# Patient Record
Sex: Female | Born: 1935 | Race: White | Hispanic: No | Marital: Married | Smoking: Former smoker
Health system: Southern US, Community
[De-identification: ages and names within clinical notes are randomized; demographics above are authoritative.]

## PROBLEM LIST (undated history)

## (undated) DIAGNOSIS — G4762 Sleep related leg cramps: Secondary | ICD-10-CM

## (undated) DIAGNOSIS — E042 Nontoxic multinodular goiter: Secondary | ICD-10-CM

## (undated) DIAGNOSIS — N3281 Overactive bladder: Secondary | ICD-10-CM

## (undated) DIAGNOSIS — I872 Venous insufficiency (chronic) (peripheral): Secondary | ICD-10-CM

## (undated) DIAGNOSIS — G629 Polyneuropathy, unspecified: Secondary | ICD-10-CM

## (undated) DIAGNOSIS — I509 Heart failure, unspecified: Secondary | ICD-10-CM

## (undated) DIAGNOSIS — R351 Nocturia: Secondary | ICD-10-CM

## (undated) DIAGNOSIS — C801 Malignant (primary) neoplasm, unspecified: Secondary | ICD-10-CM

## (undated) DIAGNOSIS — E213 Hyperparathyroidism, unspecified: Secondary | ICD-10-CM

## (undated) DIAGNOSIS — D649 Anemia, unspecified: Secondary | ICD-10-CM

## (undated) DIAGNOSIS — I480 Paroxysmal atrial fibrillation: Secondary | ICD-10-CM

## (undated) DIAGNOSIS — M810 Age-related osteoporosis without current pathological fracture: Secondary | ICD-10-CM

## (undated) DIAGNOSIS — Z9889 Other specified postprocedural states: Secondary | ICD-10-CM

## (undated) DIAGNOSIS — I4891 Unspecified atrial fibrillation: Secondary | ICD-10-CM

## (undated) DIAGNOSIS — I10 Essential (primary) hypertension: Secondary | ICD-10-CM

## (undated) DIAGNOSIS — M1990 Unspecified osteoarthritis, unspecified site: Secondary | ICD-10-CM

## (undated) DIAGNOSIS — C679 Malignant neoplasm of bladder, unspecified: Secondary | ICD-10-CM

## (undated) DIAGNOSIS — K219 Gastro-esophageal reflux disease without esophagitis: Secondary | ICD-10-CM

## (undated) DIAGNOSIS — R112 Nausea with vomiting, unspecified: Secondary | ICD-10-CM

## (undated) DIAGNOSIS — E559 Vitamin D deficiency, unspecified: Secondary | ICD-10-CM

## (undated) DIAGNOSIS — R600 Localized edema: Secondary | ICD-10-CM

## (undated) DIAGNOSIS — I5032 Chronic diastolic (congestive) heart failure: Secondary | ICD-10-CM

## (undated) DIAGNOSIS — Z9221 Personal history of antineoplastic chemotherapy: Secondary | ICD-10-CM

## (undated) DIAGNOSIS — N6310 Unspecified lump in the right breast, unspecified quadrant: Secondary | ICD-10-CM

## (undated) DIAGNOSIS — Z973 Presence of spectacles and contact lenses: Secondary | ICD-10-CM

## (undated) DIAGNOSIS — D509 Iron deficiency anemia, unspecified: Secondary | ICD-10-CM

## (undated) HISTORY — PX: OVARIAN CYST SURGERY: SHX726

## (undated) HISTORY — PX: OVARIAN CYST REMOVAL: SHX89

## (undated) HISTORY — DX: Heart failure, unspecified: I50.9

## (undated) HISTORY — PX: TONSILLECTOMY: SUR1361

## (undated) HISTORY — PX: CHOLECYSTECTOMY: SHX55

## (undated) HISTORY — PX: PARATHYROIDECTOMY: SHX19

---
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
## 2004-10-09 HISTORY — PX: THYROIDECTOMY, PARTIAL: SHX18

## 2006-10-09 DIAGNOSIS — Z853 Personal history of malignant neoplasm of breast: Secondary | ICD-10-CM

## 2006-10-09 HISTORY — DX: Personal history of malignant neoplasm of breast: Z85.3

## 2007-03-05 HISTORY — PX: MASTECTOMY WITH AXILLARY LYMPH NODE DISSECTION: SHX5661

## 2008-10-09 HISTORY — PX: MASTECTOMY: SHX3

## 2015-03-06 DIAGNOSIS — Z9221 Personal history of antineoplastic chemotherapy: Secondary | ICD-10-CM | POA: Insufficient documentation

## 2015-03-06 DIAGNOSIS — Z9012 Acquired absence of left breast and nipple: Secondary | ICD-10-CM | POA: Insufficient documentation

## 2015-03-06 DIAGNOSIS — C50812 Malignant neoplasm of overlapping sites of left female breast: Secondary | ICD-10-CM | POA: Insufficient documentation

## 2016-01-17 DIAGNOSIS — K219 Gastro-esophageal reflux disease without esophagitis: Secondary | ICD-10-CM | POA: Insufficient documentation

## 2016-01-17 DIAGNOSIS — Z853 Personal history of malignant neoplasm of breast: Secondary | ICD-10-CM | POA: Insufficient documentation

## 2016-01-17 DIAGNOSIS — N3281 Overactive bladder: Secondary | ICD-10-CM | POA: Insufficient documentation

## 2016-01-17 DIAGNOSIS — M810 Age-related osteoporosis without current pathological fracture: Secondary | ICD-10-CM | POA: Insufficient documentation

## 2016-01-17 DIAGNOSIS — K5730 Diverticulosis of large intestine without perforation or abscess without bleeding: Secondary | ICD-10-CM | POA: Insufficient documentation

## 2016-01-17 DIAGNOSIS — E042 Nontoxic multinodular goiter: Secondary | ICD-10-CM | POA: Insufficient documentation

## 2016-01-18 DIAGNOSIS — R600 Localized edema: Secondary | ICD-10-CM | POA: Insufficient documentation

## 2017-02-23 DIAGNOSIS — R739 Hyperglycemia, unspecified: Secondary | ICD-10-CM | POA: Insufficient documentation

## 2017-02-23 DIAGNOSIS — K8020 Calculus of gallbladder without cholecystitis without obstruction: Secondary | ICD-10-CM | POA: Insufficient documentation

## 2017-02-23 DIAGNOSIS — R197 Diarrhea, unspecified: Secondary | ICD-10-CM | POA: Insufficient documentation

## 2017-02-23 DIAGNOSIS — M818 Other osteoporosis without current pathological fracture: Secondary | ICD-10-CM | POA: Insufficient documentation

## 2017-02-23 DIAGNOSIS — R1011 Right upper quadrant pain: Secondary | ICD-10-CM | POA: Insufficient documentation

## 2017-02-25 HISTORY — PX: CHOLECYSTECTOMY, LAPAROSCOPIC: SHX56

## 2017-02-26 DIAGNOSIS — Z9049 Acquired absence of other specified parts of digestive tract: Secondary | ICD-10-CM | POA: Insufficient documentation

## 2017-05-03 DIAGNOSIS — E559 Vitamin D deficiency, unspecified: Secondary | ICD-10-CM | POA: Insufficient documentation

## 2018-01-29 DIAGNOSIS — R791 Abnormal coagulation profile: Secondary | ICD-10-CM | POA: Insufficient documentation

## 2018-01-29 DIAGNOSIS — Z5181 Encounter for therapeutic drug level monitoring: Secondary | ICD-10-CM | POA: Insufficient documentation

## 2018-01-29 DIAGNOSIS — Z7901 Long term (current) use of anticoagulants: Secondary | ICD-10-CM | POA: Insufficient documentation

## 2018-12-11 DIAGNOSIS — H2513 Age-related nuclear cataract, bilateral: Secondary | ICD-10-CM | POA: Insufficient documentation

## 2020-10-09 HISTORY — PX: COLONOSCOPY WITH ESOPHAGOGASTRODUODENOSCOPY (EGD): SHX5779

## 2021-07-18 ENCOUNTER — Ambulatory Visit: Payer: Self-pay | Admitting: Surgery

## 2021-07-18 DIAGNOSIS — N6311 Unspecified lump in the right breast, upper outer quadrant: Secondary | ICD-10-CM

## 2021-08-30 ENCOUNTER — Other Ambulatory Visit: Payer: Self-pay | Admitting: Surgery

## 2021-08-30 DIAGNOSIS — N6311 Unspecified lump in the right breast, upper outer quadrant: Secondary | ICD-10-CM

## 2021-09-09 DIAGNOSIS — C50912 Malignant neoplasm of unspecified site of left female breast: Secondary | ICD-10-CM | POA: Diagnosis not present

## 2021-10-04 ENCOUNTER — Telehealth: Payer: Self-pay | Admitting: Family Medicine

## 2021-10-04 NOTE — Telephone Encounter (Signed)
Pt's daughter is a pt of dr. Lorelei Pont and she was hoping her mom could be taken on since she has lost two pcp due to retirement. Please advise.

## 2021-10-05 NOTE — Telephone Encounter (Signed)
Will address on Provider's return.

## 2021-10-11 NOTE — Telephone Encounter (Signed)
Please advise 

## 2021-10-13 NOTE — Telephone Encounter (Signed)
Okay to accept.

## 2021-10-17 DIAGNOSIS — Z5181 Encounter for therapeutic drug level monitoring: Secondary | ICD-10-CM | POA: Diagnosis not present

## 2021-10-17 DIAGNOSIS — I480 Paroxysmal atrial fibrillation: Secondary | ICD-10-CM | POA: Diagnosis not present

## 2021-10-17 DIAGNOSIS — Z7901 Long term (current) use of anticoagulants: Secondary | ICD-10-CM | POA: Diagnosis not present

## 2021-10-17 DIAGNOSIS — R791 Abnormal coagulation profile: Secondary | ICD-10-CM | POA: Diagnosis not present

## 2021-10-18 NOTE — Progress Notes (Signed)
Abigail Butts RN at Dr Cornett's office notified that the patient's seed placement on 10-21-20 is too early as patient's LD of coumadin would be 10-20-20 which is 5d prior to surgery. Abigail Butts states she will send Dr Brantley Stage a message about rescheduling.

## 2021-10-19 ENCOUNTER — Other Ambulatory Visit: Payer: Self-pay

## 2021-10-19 ENCOUNTER — Encounter (HOSPITAL_BASED_OUTPATIENT_CLINIC_OR_DEPARTMENT_OTHER): Payer: Self-pay | Admitting: Surgery

## 2021-10-21 ENCOUNTER — Ambulatory Visit
Admission: RE | Admit: 2021-10-21 | Discharge: 2021-10-21 | Disposition: A | Discharge status: Home / Self Care | Payer: Medicare HMO | Source: Ambulatory Visit | Attending: Surgery | Admitting: Surgery

## 2021-10-21 DIAGNOSIS — N6311 Unspecified lump in the right breast, upper outer quadrant: Secondary | ICD-10-CM

## 2021-10-21 DIAGNOSIS — R928 Other abnormal and inconclusive findings on diagnostic imaging of breast: Secondary | ICD-10-CM | POA: Diagnosis not present

## 2021-10-24 ENCOUNTER — Encounter (HOSPITAL_BASED_OUTPATIENT_CLINIC_OR_DEPARTMENT_OTHER)
Admission: RE | Admit: 2021-10-24 | Discharge: 2021-10-24 | Disposition: A | Discharge status: Home / Self Care | Payer: Medicare HMO | Source: Ambulatory Visit | Attending: Surgery | Admitting: Surgery

## 2021-10-24 DIAGNOSIS — I4811 Longstanding persistent atrial fibrillation: Secondary | ICD-10-CM | POA: Diagnosis not present

## 2021-10-24 DIAGNOSIS — Z01818 Encounter for other preprocedural examination: Secondary | ICD-10-CM | POA: Diagnosis not present

## 2021-10-24 DIAGNOSIS — N6311 Unspecified lump in the right breast, upper outer quadrant: Secondary | ICD-10-CM | POA: Insufficient documentation

## 2021-10-24 DIAGNOSIS — K219 Gastro-esophageal reflux disease without esophagitis: Secondary | ICD-10-CM | POA: Diagnosis not present

## 2021-10-24 DIAGNOSIS — I10 Essential (primary) hypertension: Secondary | ICD-10-CM | POA: Diagnosis not present

## 2021-10-24 DIAGNOSIS — Z9013 Acquired absence of bilateral breasts and nipples: Secondary | ICD-10-CM | POA: Diagnosis not present

## 2021-10-24 DIAGNOSIS — I4891 Unspecified atrial fibrillation: Secondary | ICD-10-CM | POA: Diagnosis not present

## 2021-10-24 DIAGNOSIS — Z853 Personal history of malignant neoplasm of breast: Secondary | ICD-10-CM | POA: Diagnosis not present

## 2021-10-24 LAB — CBC WITH DIFFERENTIAL/PLATELET
Abs Immature Granulocytes: 0.05 10*3/uL (ref 0.00–0.07)
Basophils Absolute: 0 10*3/uL (ref 0.0–0.1)
Basophils Relative: 0 %
Eosinophils Absolute: 0.3 10*3/uL (ref 0.0–0.5)
Eosinophils Relative: 3 %
HCT: 37.9 % (ref 36.0–46.0)
Hemoglobin: 11.9 g/dL — ABNORMAL LOW (ref 12.0–15.0)
Immature Granulocytes: 1 %
Lymphocytes Relative: 21 %
Lymphs Abs: 2 10*3/uL (ref 0.7–4.0)
MCH: 26.3 pg (ref 26.0–34.0)
MCHC: 31.4 g/dL (ref 30.0–36.0)
MCV: 83.7 fL (ref 80.0–100.0)
Monocytes Absolute: 0.8 10*3/uL (ref 0.1–1.0)
Monocytes Relative: 8 %
Neutro Abs: 6.6 10*3/uL (ref 1.7–7.7)
Neutrophils Relative %: 67 %
Platelets: 310 10*3/uL (ref 150–400)
RBC: 4.53 MIL/uL (ref 3.87–5.11)
RDW: 14.4 % (ref 11.5–15.5)
WBC: 9.8 10*3/uL (ref 4.0–10.5)
nRBC: 0 % (ref 0.0–0.2)

## 2021-10-24 LAB — COMPREHENSIVE METABOLIC PANEL
ALT: 30 U/L (ref 0–44)
AST: 25 U/L (ref 15–41)
Albumin: 3.9 g/dL (ref 3.5–5.0)
Alkaline Phosphatase: 45 U/L (ref 38–126)
Anion gap: 6 (ref 5–15)
BUN: 16 mg/dL (ref 8–23)
CO2: 27 mmol/L (ref 22–32)
Calcium: 10.3 mg/dL (ref 8.9–10.3)
Chloride: 108 mmol/L (ref 98–111)
Creatinine, Ser: 0.66 mg/dL (ref 0.44–1.00)
GFR, Estimated: >60 mL/min (ref >60)
Glucose, Bld: 97 mg/dL (ref 70–99)
Potassium: 4.6 mmol/L (ref 3.5–5.1)
Sodium: 141 mmol/L (ref 135–145)
Total Bilirubin: 0.8 mg/dL (ref 0.3–1.2)
Total Protein: 6.8 g/dL (ref 6.5–8.1)

## 2021-10-24 LAB — PROTIME-INR
INR: 1.1 (ref 0.8–1.2)
Prothrombin Time: 14 seconds (ref 11.4–15.2)

## 2021-10-24 NOTE — Progress Notes (Addendum)
Sent message reminding pt to come in for lab work.   Surgical soap given with instructions, pt verbalized understanding. Enhanced Recovery after Surgery  Enhanced Recovery after Surgery is a protocol used to improve the stress on your body and your recovery after surgery.  Patient Instructions  The night before surgery:  No food after midnight. ONLY clear liquids after midnight  The day of surgery (if you do NOT have diabetes):  Drink ONE (1) Pre-Surgery Clear Ensure as directed.   This drink was given to you during your hospital  pre-op appointment visit. The pre-op nurse will instruct you on the time to drink the  Pre-Surgery Ensure depending on your surgery time. Finish the drink at the designated time by the pre-op nurse.  Nothing else to drink after completing the  Pre-Surgery Clear Ensure.  The day of surgery (if you have diabetes): Drink ONE (1) Gatorade 2 (G2) as directed. This drink was given to you during your hospital  pre-op appointment visit.  The pre-op nurse will instruct you on the time to drink the   Gatorade 2 (G2) depending on your surgery time. Color of the Gatorade may vary. Red is not allowed. Nothing else to drink after completing the  Gatorade 2 (G2).         If office.you have questions, please contact your surgeons office

## 2021-10-25 ENCOUNTER — Ambulatory Visit (HOSPITAL_BASED_OUTPATIENT_CLINIC_OR_DEPARTMENT_OTHER)
Admission: RE | Admit: 2021-10-25 | Discharge: 2021-10-25 | Disposition: A | Discharge status: Home / Self Care | Payer: Medicare HMO | Attending: Surgery | Admitting: Surgery

## 2021-10-25 ENCOUNTER — Encounter (HOSPITAL_BASED_OUTPATIENT_CLINIC_OR_DEPARTMENT_OTHER): Payer: Self-pay | Admitting: Surgery

## 2021-10-25 ENCOUNTER — Ambulatory Visit (HOSPITAL_BASED_OUTPATIENT_CLINIC_OR_DEPARTMENT_OTHER): Payer: Medicare HMO | Admitting: Anesthesiology

## 2021-10-25 ENCOUNTER — Other Ambulatory Visit: Payer: Self-pay

## 2021-10-25 ENCOUNTER — Encounter (HOSPITAL_BASED_OUTPATIENT_CLINIC_OR_DEPARTMENT_OTHER)
Admission: RE | Disposition: A | Discharge status: Home / Self Care | Payer: Self-pay | Source: Home / Self Care | Attending: Surgery

## 2021-10-25 ENCOUNTER — Ambulatory Visit
Admission: RE | Admit: 2021-10-25 | Discharge: 2021-10-25 | Disposition: A | Discharge status: Home / Self Care | Payer: Self-pay | Source: Ambulatory Visit | Attending: Surgery | Admitting: Surgery

## 2021-10-25 DIAGNOSIS — K219 Gastro-esophageal reflux disease without esophagitis: Secondary | ICD-10-CM | POA: Insufficient documentation

## 2021-10-25 DIAGNOSIS — N6311 Unspecified lump in the right breast, upper outer quadrant: Secondary | ICD-10-CM | POA: Insufficient documentation

## 2021-10-25 DIAGNOSIS — I4811 Longstanding persistent atrial fibrillation: Secondary | ICD-10-CM

## 2021-10-25 DIAGNOSIS — I4891 Unspecified atrial fibrillation: Secondary | ICD-10-CM | POA: Insufficient documentation

## 2021-10-25 DIAGNOSIS — R928 Other abnormal and inconclusive findings on diagnostic imaging of breast: Secondary | ICD-10-CM | POA: Diagnosis not present

## 2021-10-25 DIAGNOSIS — Z853 Personal history of malignant neoplasm of breast: Secondary | ICD-10-CM | POA: Insufficient documentation

## 2021-10-25 DIAGNOSIS — I10 Essential (primary) hypertension: Secondary | ICD-10-CM | POA: Insufficient documentation

## 2021-10-25 DIAGNOSIS — N6031 Fibrosclerosis of right breast: Secondary | ICD-10-CM | POA: Diagnosis not present

## 2021-10-25 DIAGNOSIS — N61 Mastitis without abscess: Secondary | ICD-10-CM | POA: Diagnosis not present

## 2021-10-25 DIAGNOSIS — N6489 Other specified disorders of breast: Secondary | ICD-10-CM | POA: Diagnosis not present

## 2021-10-25 DIAGNOSIS — Z9013 Acquired absence of bilateral breasts and nipples: Secondary | ICD-10-CM | POA: Insufficient documentation

## 2021-10-25 HISTORY — DX: Nausea with vomiting, unspecified: R11.2

## 2021-10-25 HISTORY — DX: Overactive bladder: N32.81

## 2021-10-25 HISTORY — DX: Nausea with vomiting, unspecified: Z98.890

## 2021-10-25 HISTORY — DX: Essential (primary) hypertension: I10

## 2021-10-25 HISTORY — DX: Unspecified atrial fibrillation: I48.91

## 2021-10-25 HISTORY — DX: Gastro-esophageal reflux disease without esophagitis: K21.9

## 2021-10-25 HISTORY — DX: Unspecified lump in the right breast, unspecified quadrant: N63.10

## 2021-10-25 HISTORY — PX: BREAST LUMPECTOMY WITH RADIOACTIVE SEED LOCALIZATION: SHX6424

## 2021-10-25 HISTORY — DX: Anemia, unspecified: D64.9

## 2021-10-25 HISTORY — DX: Malignant (primary) neoplasm, unspecified: C80.1

## 2021-10-25 HISTORY — DX: Other specified postprocedural states: Z98.890

## 2021-10-25 SURGERY — BREAST LUMPECTOMY WITH RADIOACTIVE SEED LOCALIZATION
Anesthesia: General | Site: Breast | Laterality: Right

## 2021-10-25 MED ORDER — HYDROCODONE-ACETAMINOPHEN 5-325 MG PO TABS: 1.0000 | ORAL_TABLET | Freq: Four times a day (QID) | ORAL | 0 refills | Status: DC | PRN

## 2021-10-25 MED ORDER — LIDOCAINE HCL (CARDIAC) PF 100 MG/5ML IV SOSY
PREFILLED_SYRINGE | INTRAVENOUS | Status: DC | PRN
Start: 2021-10-25 — End: 2021-10-25
  Administered 2021-10-25: 60 mg via INTRATRACHEAL

## 2021-10-25 MED ORDER — CEFAZOLIN SODIUM-DEXTROSE 2-4 GM/100ML-% IV SOLN
2.0000 g | INTRAVENOUS | Status: AC
  Administered 2021-10-25: 2 g via INTRAVENOUS

## 2021-10-25 MED ORDER — ONDANSETRON HCL 4 MG/2ML IJ SOLN
INTRAMUSCULAR | Status: AC
  Filled 2021-10-25: qty 2

## 2021-10-25 MED ORDER — CHLORHEXIDINE GLUCONATE CLOTH 2 % EX PADS
6.0000 | MEDICATED_PAD | Freq: Once | CUTANEOUS | Status: AC
  Administered 2021-10-25: 6 via TOPICAL

## 2021-10-25 MED ORDER — FENTANYL CITRATE (PF) 100 MCG/2ML IJ SOLN
INTRAMUSCULAR | Status: DC | PRN
  Administered 2021-10-25: 50 ug via INTRAVENOUS
  Administered 2021-10-25: 25 ug via INTRAVENOUS

## 2021-10-25 MED ORDER — SODIUM CHLORIDE 0.9 % IV SOLN
INTRAVENOUS | Status: AC
  Filled 2021-10-25: qty 10

## 2021-10-25 MED ORDER — ACETAMINOPHEN 325 MG PO TABS: 325.0000 mg | ORAL_TABLET | ORAL | Status: DC | PRN

## 2021-10-25 MED ORDER — ACETAMINOPHEN 160 MG/5ML PO SOLN: 325.0000 mg | ORAL | Status: DC | PRN

## 2021-10-25 MED ORDER — PROPOFOL 10 MG/ML IV BOLUS
INTRAVENOUS | Status: DC | PRN
Start: 2021-10-25 — End: 2021-10-25
  Administered 2021-10-25: 170 mg via INTRAVENOUS
  Administered 2021-10-25: 20 mg via INTRAVENOUS

## 2021-10-25 MED ORDER — LACTATED RINGERS IV SOLN: INTRAVENOUS | Status: DC

## 2021-10-25 MED ORDER — OXYCODONE HCL 5 MG PO TABS
5.0000 mg | ORAL_TABLET | Freq: Once | ORAL | Status: DC | PRN
Start: 2021-10-25 — End: 2021-10-25

## 2021-10-25 MED ORDER — CEFAZOLIN SODIUM-DEXTROSE 2-4 GM/100ML-% IV SOLN
INTRAVENOUS | Status: AC
  Filled 2021-10-25: qty 100

## 2021-10-25 MED ORDER — FENTANYL CITRATE (PF) 100 MCG/2ML IJ SOLN
INTRAMUSCULAR | Status: AC
  Filled 2021-10-25: qty 2

## 2021-10-25 MED ORDER — PROPOFOL 10 MG/ML IV BOLUS
INTRAVENOUS | Status: AC
  Filled 2021-10-25: qty 20

## 2021-10-25 MED ORDER — ACETAMINOPHEN 500 MG PO TABS
ORAL_TABLET | ORAL | Status: AC
  Filled 2021-10-25: qty 2

## 2021-10-25 MED ORDER — DEXAMETHASONE SODIUM PHOSPHATE 10 MG/ML IJ SOLN
INTRAMUSCULAR | Status: AC
  Filled 2021-10-25: qty 1

## 2021-10-25 MED ORDER — DEXAMETHASONE SODIUM PHOSPHATE 10 MG/ML IJ SOLN
INTRAMUSCULAR | Status: DC | PRN
  Administered 2021-10-25: 5 mg via INTRAVENOUS

## 2021-10-25 MED ORDER — ACETAMINOPHEN 500 MG PO TABS
1000.0000 mg | ORAL_TABLET | ORAL | Status: AC
  Administered 2021-10-25: 1000 mg via ORAL

## 2021-10-25 MED ORDER — LIDOCAINE 2% (20 MG/ML) 5 ML SYRINGE
INTRAMUSCULAR | Status: AC
  Filled 2021-10-25: qty 5

## 2021-10-25 MED ORDER — BUPIVACAINE-EPINEPHRINE (PF) 0.25% -1:200000 IJ SOLN
INTRAMUSCULAR | Status: DC | PRN
  Administered 2021-10-25: 20 mL

## 2021-10-25 MED ORDER — OXYCODONE HCL 5 MG/5ML PO SOLN: 5.0000 mg | Freq: Once | ORAL | Status: DC | PRN

## 2021-10-25 MED ORDER — CHLORHEXIDINE GLUCONATE CLOTH 2 % EX PADS: 6.0000 | MEDICATED_PAD | Freq: Once | CUTANEOUS | Status: DC

## 2021-10-25 MED ORDER — FENTANYL CITRATE (PF) 100 MCG/2ML IJ SOLN: 25.0000 ug | INTRAMUSCULAR | Status: DC | PRN

## 2021-10-25 MED ORDER — SODIUM CHLORIDE 0.9 % IV SOLN
INTRAVENOUS | Status: DC | PRN
  Administered 2021-10-25: 100 mL

## 2021-10-25 MED ORDER — ONDANSETRON HCL 4 MG/2ML IJ SOLN
INTRAMUSCULAR | Status: DC | PRN
  Administered 2021-10-25: 4 mg via INTRAVENOUS

## 2021-10-25 MED ORDER — EPHEDRINE SULFATE 50 MG/ML IJ SOLN
INTRAMUSCULAR | Status: DC | PRN
  Administered 2021-10-25 (×2): 10 mg via INTRAVENOUS

## 2021-10-25 MED ORDER — EPHEDRINE 5 MG/ML INJ
INTRAVENOUS | Status: AC
  Filled 2021-10-25: qty 5

## 2021-10-25 SURGICAL SUPPLY — 42 items
ADH SKN CLS APL DERMABOND .7 (GAUZE/BANDAGES/DRESSINGS) ×1
APL PRP STRL LF DISP 70% ISPRP (MISCELLANEOUS) ×1
BINDER BREAST XXLRG (GAUZE/BANDAGES/DRESSINGS) ×1 IMPLANT
BLADE SURG 15 STRL LF DISP TIS (BLADE) ×1 IMPLANT
BLADE SURG 15 STRL SS (BLADE) ×2
CHLORAPREP W/TINT 26 (MISCELLANEOUS) ×2 IMPLANT
COVER BACK TABLE 60X90IN (DRAPES) ×2 IMPLANT
COVER MAYO STAND STRL (DRAPES) ×2 IMPLANT
COVER PROBE W GEL 5X96 (DRAPES) ×2 IMPLANT
DERMABOND ADVANCED (GAUZE/BANDAGES/DRESSINGS) ×1
DERMABOND ADVANCED .7 DNX12 (GAUZE/BANDAGES/DRESSINGS) ×1 IMPLANT
DRAPE LAPAROTOMY 100X72 PEDS (DRAPES) ×2 IMPLANT
DRAPE UTILITY XL STRL (DRAPES) ×2 IMPLANT
ELECT COATED BLADE 2.86 ST (ELECTRODE) ×2 IMPLANT
ELECT REM PT RETURN 9FT ADLT (ELECTROSURGICAL) ×2
ELECTRODE REM PT RTRN 9FT ADLT (ELECTROSURGICAL) ×1 IMPLANT
GLOVE SRG 8 PF TXTR STRL LF DI (GLOVE) ×1 IMPLANT
GLOVE SURG LTX SZ8 (GLOVE) ×2 IMPLANT
GLOVE SURG POLYISO LF SZ7 (GLOVE) ×1 IMPLANT
GLOVE SURG POLYISO LF SZ7.5 (GLOVE) ×1 IMPLANT
GLOVE SURG UNDER POLY LF SZ7 (GLOVE) ×1 IMPLANT
GLOVE SURG UNDER POLY LF SZ7.5 (GLOVE) ×1 IMPLANT
GLOVE SURG UNDER POLY LF SZ8 (GLOVE) ×2
GOWN STRL REUS W/ TWL LRG LVL3 (GOWN DISPOSABLE) ×2 IMPLANT
GOWN STRL REUS W/ TWL XL LVL3 (GOWN DISPOSABLE) ×1 IMPLANT
GOWN STRL REUS W/TWL LRG LVL3 (GOWN DISPOSABLE) ×4
GOWN STRL REUS W/TWL XL LVL3 (GOWN DISPOSABLE) ×4
KIT MARKER MARGIN INK (KITS) ×2 IMPLANT
NDL HYPO 25X1 1.5 SAFETY (NEEDLE) ×1 IMPLANT
NEEDLE HYPO 25X1 1.5 SAFETY (NEEDLE) ×2 IMPLANT
NS IRRIG 1000ML POUR BTL (IV SOLUTION) ×2 IMPLANT
PACK BASIN DAY SURGERY FS (CUSTOM PROCEDURE TRAY) ×2 IMPLANT
PENCIL SMOKE EVACUATOR (MISCELLANEOUS) ×2 IMPLANT
SLEEVE SCD COMPRESS KNEE MED (STOCKING) ×2 IMPLANT
SPONGE T-LAP 4X18 ~~LOC~~+RFID (SPONGE) ×2 IMPLANT
SUT MNCRL AB 4-0 PS2 18 (SUTURE) ×2 IMPLANT
SUT VICRYL 3-0 CR8 SH (SUTURE) ×2 IMPLANT
SYR CONTROL 10ML LL (SYRINGE) ×2 IMPLANT
TOWEL GREEN STERILE FF (TOWEL DISPOSABLE) ×2 IMPLANT
TRAY FAXITRON CT DISP (TRAY / TRAY PROCEDURE) ×2 IMPLANT
TUBE CONNECTING 20X1/4 (TUBING) ×1 IMPLANT
YANKAUER SUCT BULB TIP NO VENT (SUCTIONS) ×1 IMPLANT

## 2021-10-25 NOTE — Anesthesia Procedure Notes (Signed)
Procedure Name: LMA Insertion Date/Time: 10/25/2021 9:59 AM Performed by: Glory Buff, CRNA Pre-anesthesia Checklist: Patient identified, Emergency Drugs available, Suction available and Patient being monitored Patient Re-evaluated:Patient Re-evaluated prior to induction Oxygen Delivery Method: Circle system utilized Preoxygenation: Pre-oxygenation with 100% oxygen Induction Type: IV induction LMA: LMA inserted LMA Size: 4.0 Number of attempts: 1 Placement Confirmation: positive ETCO2 Tube secured with: Tape Dental Injury: Teeth and Oropharynx as per pre-operative assessment

## 2021-10-25 NOTE — Anesthesia Postprocedure Evaluation (Signed)
Anesthesia Post Note  Patient: Loretta Gates  Procedure(s) Performed: RIGHT BREAST LUMPECTOMY WITH RADIOACTIVE SEED LOCALIZATION (Right: Breast)     Patient location during evaluation: Phase II Anesthesia Type: General Level of consciousness: awake and alert Pain management: pain level controlled Vital Signs Assessment: post-procedure vital signs reviewed and stable Respiratory status: spontaneous breathing Cardiovascular status: stable Postop Assessment: no apparent nausea or vomiting Anesthetic complications: no   No notable events documented.  Last Vitals:  Vitals:   10/25/21 1100 10/25/21 1110  BP: 136/81   Pulse: 77 72  Resp: 17 13  Temp:    SpO2: 97% 96%    Last Pain:  Vitals:   10/25/21 1110  TempSrc:   PainSc: 0-No pain                 Huston Foley

## 2021-10-25 NOTE — Interval H&P Note (Signed)
History and Physical Interval Note:  10/25/2021 8:59 AM  Loretta Gates  has presented today for surgery, with the diagnosis of RIGHT BREAST MASS.  The various methods of treatment have been discussed with the patient and family. After consideration of risks, benefits and other options for treatment, the patient has consented to  Procedure(s): RIGHT BREAST LUMPECTOMY WITH RADIOACTIVE SEED LOCALIZATION (Right) as a surgical intervention.  The patient's history has been reviewed, patient examined, no change in status, stable for surgery.  I have reviewed the patient's chart and labs.  Questions were answered to the patient's satisfaction.     [place]

## 2021-10-25 NOTE — Anesthesia Preprocedure Evaluation (Addendum)
Anesthesia Evaluation  Patient identified by MRN, date of birth, ID band Patient awake    Reviewed: Allergy & Precautions, NPO status , Patient's Chart, lab work & pertinent test results  History of Anesthesia Complications (+) PONV and history of anesthetic complications  Airway Mallampati: I       Dental no notable dental hx.    Pulmonary neg pulmonary ROS,    Pulmonary exam normal        Cardiovascular hypertension, Pt. on medications and Pt. on home beta blockers + dysrhythmias Atrial Fibrillation  Rhythm:Regular Rate:Normal     Neuro/Psych negative neurological ROS  negative psych ROS   GI/Hepatic Neg liver ROS, GERD  Medicated,  Endo/Other  negative endocrine ROS  Renal/GU negative Renal ROS     Musculoskeletal negative musculoskeletal ROS (+)   Abdominal Normal abdominal exam  (+)   Peds  Hematology   Anesthesia Other Findings   Reproductive/Obstetrics                             Anesthesia Physical Anesthesia Plan  ASA: 3  Anesthesia Plan: General   Post-op Pain Management: Minimal or no pain anticipated   Induction: Intravenous  PONV Risk Score and Plan: 4 or greater and Ondansetron, Dexamethasone and Treatment may vary due to age or medical condition  Airway Management Planned: LMA  Additional Equipment: None  Intra-op Plan:   Post-operative Plan: Extubation in OR  Informed Consent: I have reviewed the patients History and Physical, chart, labs and discussed the procedure including the risks, benefits and alternatives for the proposed anesthesia with the patient or authorized representative who has indicated his/her understanding and acceptance.     Dental advisory given  Plan Discussed with: CRNA  Anesthesia Plan Comments:         Anesthesia Quick Evaluation

## 2021-10-25 NOTE — Discharge Instructions (Addendum)
[facility] Office Phone Number [phone]  BREAST BIOPSY/ PARTIAL MASTECTOMY: POST OP INSTRUCTIONS  Always review your discharge instruction sheet given to you by the facility where your surgery was performed.  IF YOU HAVE DISABILITY OR FAMILY LEAVE FORMS, YOU MUST BRING THEM TO THE OFFICE FOR PROCESSING.  DO NOT GIVE THEM TO YOUR DOCTOR.  A prescription for pain medication may be given to you upon discharge.  Take your pain medication as prescribed, if needed.  If narcotic pain medicine is not needed, then you may take acetaminophen (Tylenol) or ibuprofen (Advil) as needed. Take your usually prescribed medications unless otherwise directed If you need a refill on your pain medication, please contact your pharmacy.  They will contact our office to request authorization.  Prescriptions will not be filled after 5pm or on week-ends. You should eat very light the first 24 hours after surgery, such as soup, crackers, pudding, etc.  Resume your normal diet the day after surgery. Most patients will experience some swelling and bruising in the breast.  Ice packs and a good support bra will help.  Swelling and bruising can take several days to resolve.  It is common to experience some constipation if taking pain medication after surgery.  Increasing fluid intake and taking a stool softener will usually help or prevent this problem from occurring.  A mild laxative (Milk of Magnesia or Miralax) should be taken according to package directions if there are no bowel movements after 48 hours. Unless discharge instructions indicate otherwise, you may remove your bandages 24-48 hours after surgery, and you may shower at that time.  You may have steri-strips (small skin tapes) in place directly over the incision.  These strips should be left on the skin for 7-10 days.  If your surgeon used skin glue on the incision, you may shower in 24 hours.  The glue will flake off over the next 2-3 weeks.  Any  sutures or staples will be removed at the office during your follow-up visit. ACTIVITIES:  You may resume regular daily activities (gradually increasing) beginning the next day.  Wearing a good support bra or sports bra minimizes pain and swelling.  You may have sexual intercourse when it is comfortable. You may drive when you no longer are taking prescription pain medication, you can comfortably wear a seatbelt, and you can safely maneuver your car and apply brakes. RETURN TO WORK:  ______________________________________________________________________________________ Dennis Bast should see your doctor in the office for a follow-up appointment approximately two weeks after your surgery.  Your doctors nurse will typically make your follow-up appointment when she calls you with your pathology report.  Expect your pathology report 2-3 business days after your surgery.  You may call to check if you do not hear from [place] after three days. OTHER INSTRUCTIONS: _______________________________________________________________________________________________ _____________________________________________________________________________________________________________________________________ _____________________________________________________________________________________________________________________________________ _____________________________________________________________________________________________________________________________________  WHEN TO CALL YOUR DOCTOR: Fever over 101.0 Nausea and/or vomiting. Extreme swelling or bruising. Continued bleeding from incision. Increased pain, redness, or drainage from the incision.  The clinic staff is available to answer your questions during regular business hours.  Please dont hesitate to call and ask to speak to one of the nurses for clinical concerns.  If you have a medical emergency, go to the nearest emergency room or call 911.  A surgeon from [facility] is always on call at the hospital.  For further questions, please visit [email or web address]     No Tylenol before 2:15pm if needed.  Post Anesthesia Home Care Instructions  Activity:  Get plenty of rest for the remainder of the day. A responsible individual must stay with you for 24 hours following the procedure.  For the next 24 hours, DO NOT: -Drive a car -Paediatric nurse -Drink alcoholic beverages -Take any medication unless instructed by your physician -Make any legal decisions or sign important papers.  Meals: Start with liquid foods such as gelatin or soup. Progress to regular foods as tolerated. Avoid greasy, spicy, heavy foods. If nausea and/or vomiting occur, drink only clear liquids until the nausea and/or vomiting subsides. Call your physician if vomiting continues.  Special Instructions/Symptoms: Your throat may feel dry or sore from the anesthesia or the breathing tube placed in your throat during surgery. If this causes discomfort, gargle with warm salt water. The discomfort should disappear within 24 hours.  If you had a scopolamine patch placed behind your ear for the management of post- operative nausea and/or vomiting:  1. The medication in the patch is effective for 72 hours, after which it should be removed.  Wrap patch in a tissue and discard in the trash. Wash hands thoroughly with soap and water. 2. You may remove the patch earlier than 72 hours if you experience unpleasant side effects which may include dry mouth, dizziness or visual disturbances. 3. Avoid touching the patch. Wash your hands with soap and water after contact with the patch.

## 2021-10-25 NOTE — Op Note (Signed)
Preoperative diagnosis: right breast mass   Postoperative diagnosis: Same   Procedure: Right breast seed localized lumpectomy  Surgeon: Erroll Luna M.D.  Assistant: Radonna Ricker MD   I was personally present/performed the key and critical portions of this procedure and immediately available throughout the entire procedure, as documented in my operative note.   Anesthesia: Gen. With 0.25% Sensorcaine local  EBL: 20 cc  Specimen: right breast tissue with clip and radioactive seed in the specimen. Verified with neoprobe and radiographic image showing both seed and clip in specimen  Indications for procedure: The patient presents for  right  breast lumpectomy after core biopsy showed  a discordant mass upper outer quadrant   Discussed the rationale for considering excision. Small risk of malignancy associated with lesion after core biopsy. Discussed observation. Discussed wire / seed localization. Patient desired excision of left breast papilloma.The procedure has been discussed with the patient. Alternatives to surgery have been discussed with the patient.  Risks of surgery include bleeding,  Infection,  Seroma formation, death,  and the need for further surgery.   The patient understands and wishes to proceed.   Description of procedure: Patient underwent seed placement as an outpatient. Patient presents today for right  breast seed localized lumpectomy. Patient seen in the   holding area. Questions are answered . Patient taken back to the operating room and placed supine upon the OR table. After induction of general anesthesia, left breast prepped and draped in a sterile fashion. Timeout was done to verify proper sizing procedure. Neoprobe used and hot spot identified and left breast upper-outer quadrant. This was marked with pen. Curvilinear incision made right  upper outer quadrant breast. Dissection used with the help of a neoprobe around the tissue where the seed and clip were located.  Tissue removed in its entirety with gross negative  margins.. Neoprobe used and seed within specimen. Radiographs taken which show clip and seed  In specimen.hemostasis achieved and cavity closed with 3-0 Vicryl and 4-0 Monocryl. Dermabond applied. All final counts found to be correct. Specimen transported to pathology. Patient awoke extubated taken to recovery in satisfactory condition.

## 2021-10-25 NOTE — Transfer of Care (Signed)
Immediate Anesthesia Transfer of Care Note  Patient: Loretta Gates  Procedure(s) Performed: RIGHT BREAST LUMPECTOMY WITH RADIOACTIVE SEED LOCALIZATION (Right: Breast)  Patient Location: PACU  Anesthesia Type:General  Level of Consciousness: drowsy and patient cooperative  Airway & Oxygen Therapy: Patient Spontanous Breathing and Patient connected to face mask oxygen  Post-op Assessment: Report given to RN and Post -op Vital signs reviewed and stable  Post vital signs: Reviewed and stable  Last Vitals:  Vitals Value Taken Time  BP    Temp    Pulse 76 10/25/21 1047  Resp    SpO2 98 % 10/25/21 1047  Vitals shown include unvalidated device data.  Last Pain:  Vitals:   10/25/21 0804  TempSrc: Oral  PainSc: 0-No pain      Patients Stated Pain Goal: 5 (04/19/18 7588)  Complications: No notable events documented.

## 2021-10-25 NOTE — H&P (Signed)
History of Present Illness: Loretta Gates is a 86 y.o. female who is seen today as an office consultation at the request of Dr. Urology for evaluation of Advice Only (breast) .   Patient presents for evaluation of discordant right breast biopsy. She has a history of left breast cancer treated in 2008 with mastectomy. She underwent recent screening mammogram with subsequent diagnostic mammography and core biopsy due to a masslike lesion right breast upper outer quadrant. Core biopsy showed fibroadipose tissue without atypia. Right axillar lymph node was also biopsied which was benign. She is here today to discuss discordant biopsy and fibroid lumpectomy. No history of breast pain breast mass or nipple discharge.  Review of Systems: A complete review of systems was obtained from the patient. I have reviewed this information and discussed as appropriate with the patient. See HPI as well for other ROS.    Medical History: Past Medical History:  Diagnosis Date   History of cancer   Hypertension   There is no problem list on file for this patient.  Past Surgical History:  Procedure Laterality Date   CHOLECYSTECTOMY   MASTECTOMY BILATERAL FOR GYNECOMASTIA   thyroid surgery    Allergies  Allergen Reactions   Venom-Honey Bee Anaphylaxis   Levofloxacin Itching   Current Outpatient Medications on File Prior to Visit  Medication Sig Dispense Refill   amLODIPine (NORVASC) 5 MG tablet Take 1 tablet by mouth once daily   denosumab (PROLIA) 60 mg/mL inj syringe Inject subcutaneously   irbesartan (AVAPRO) 300 MG tablet Take 1 tablet by mouth once daily   oxybutynin (DITROPAN XL) 15 MG XL tablet Take 1 tablet by mouth once daily   warfarin (COUMADIN) 5 MG tablet Take by mouth   cholestyramine (QUESTRAN) 4 gram oral powder TAKE 1 TEASPOONFUL TWO TIMES A DAY BEFORE A MEAL   magnesium 250 mg Tab Take 1 tablet by mouth once daily   No current facility-administered medications on file prior to  visit.   Family History  Problem Relation Age of Onset   High blood pressure (Hypertension) Mother   High blood pressure (Hypertension) Father    Social History   Tobacco Use  Smoking Status Never Smoker  Smokeless Tobacco Never Used    Social History   Socioeconomic History   Marital status: Unknown  Tobacco Use   Smoking status: Never Smoker   Smokeless tobacco: Never Used  Substance and Sexual Activity   Alcohol use: Never   Drug use: Never   Objective:   Vitals:  07/18/21 1151  BP: 128/80  Pulse: 70  Temp: 36.9 C (98.4 F)  SpO2: 97%  Weight: 90.4 kg (199 lb 3.2 oz)  Height: 147.3 cm (4\' 10" )   Body mass index is 41.63 kg/m.  Physical Exam Constitutional:  Appearance: Normal appearance.  HENT:  Head: Normocephalic and atraumatic.  Mouth/Throat:  Mouth: Mucous membranes are moist.  Eyes:  Extraocular Movements: Extraocular movements intact.  Pupils: Pupils are equal, round, and reactive to light.  Cardiovascular:  Rate and Rhythm: Rhythm irregular.  Pulmonary:  Effort: Pulmonary effort is normal.  Breath sounds: No stridor.  Chest:   Lymphadenopathy:  Upper Body:  Right upper body: No supraclavicular or axillary adenopathy.  Left upper body: No supraclavicular or axillary adenopathy.  Neurological:  General: No focal deficit present.  Mental Status: She is alert and oriented to person, place, and time.  Psychiatric:  Mood and Affect: Mood normal.  Behavior: Behavior normal.     Labs, Imaging  and Diagnostic Testing: Right breast core biopsy shows benign fibroadipose tissue. Lymph node biopsy shows benign lymph node right axilla.  Mammogram shows a 1 cm mass right breast upper outer quadrant which appears abnormal. Core biopsy was done. Please see results below.  Assessment and Plan:  Diagnoses and all orders for this visit:  Mass of upper outer quadrant of right breast  Other orders - PROCEDURE EXTERNAL - EXTERNAL PATHOLOGY  RESULT    Discussed discordant biopsy and rationale for doing lumpectomy. She is on Coumadin for A. fib and this can be stopped per cardiology clearance. Other option is observation. She is relatively fit and active for age. She will think things over. I told her if she does not choose to do lumpectomy, she should get a repeat mammogram in 6 months. Discussed potential risk of malignancy in the circumstances being 10% or less. Given her previous history of breast cancer though I strongly encouraged her to consider this. She will talk it over and let me know.  No follow-ups on file.  Kennieth Francois, MD

## 2021-10-26 ENCOUNTER — Encounter (HOSPITAL_BASED_OUTPATIENT_CLINIC_OR_DEPARTMENT_OTHER): Payer: Self-pay | Admitting: Surgery

## 2021-10-27 LAB — SURGICAL PATHOLOGY

## 2021-10-31 DIAGNOSIS — Z8739 Personal history of other diseases of the musculoskeletal system and connective tissue: Secondary | ICD-10-CM | POA: Diagnosis not present

## 2021-10-31 DIAGNOSIS — I480 Paroxysmal atrial fibrillation: Secondary | ICD-10-CM | POA: Diagnosis not present

## 2021-10-31 DIAGNOSIS — E213 Hyperparathyroidism, unspecified: Secondary | ICD-10-CM | POA: Diagnosis not present

## 2021-10-31 DIAGNOSIS — I10 Essential (primary) hypertension: Secondary | ICD-10-CM | POA: Diagnosis not present

## 2021-10-31 DIAGNOSIS — Z853 Personal history of malignant neoplasm of breast: Secondary | ICD-10-CM | POA: Diagnosis not present

## 2021-10-31 DIAGNOSIS — Z7901 Long term (current) use of anticoagulants: Secondary | ICD-10-CM | POA: Diagnosis not present

## 2021-10-31 DIAGNOSIS — N3281 Overactive bladder: Secondary | ICD-10-CM | POA: Diagnosis not present

## 2021-10-31 DIAGNOSIS — K219 Gastro-esophageal reflux disease without esophagitis: Secondary | ICD-10-CM | POA: Diagnosis not present

## 2021-11-02 DIAGNOSIS — Z5181 Encounter for therapeutic drug level monitoring: Secondary | ICD-10-CM | POA: Diagnosis not present

## 2021-11-02 DIAGNOSIS — Z7901 Long term (current) use of anticoagulants: Secondary | ICD-10-CM | POA: Diagnosis not present

## 2021-11-02 DIAGNOSIS — R791 Abnormal coagulation profile: Secondary | ICD-10-CM | POA: Diagnosis not present

## 2021-11-02 DIAGNOSIS — I480 Paroxysmal atrial fibrillation: Secondary | ICD-10-CM | POA: Diagnosis not present

## 2021-11-12 NOTE — Progress Notes (Addendum)
[facility], [place], [place] [address] [phone]  Date:  11/14/2021   Name:  Loretta Gates   DOB:  06-05-1936   MRN:  [id number]  PCP:  Lurena Joiner, AGNP-C    Chief Complaint: New Patient (Initial Visit) (Coming from [facility]- last 2 PCPs have retired. Marine scientist questions: Lumpectomy 1 month ago, was fine until this AM- L breast looks red. /Flu shot: received 10/2022t [facility] /)   History of Present Illness:  Loretta Gates is a 86 y.o. very pleasant female patient who presents with the following:  Seen today as a new patient- her last PCP retired, I take care of her daughter so she came to see me as well  History of atrial fib, uses Coumadin Also history of hyperparathyroidism status post parathyroidectomy.  She is followed by endocrinology- she is just seen annually  Hypertension on appropriate medication She had left breast cancer treated with mastectomy in 2008. She had a right-sided lumpectomy on 1/17 (results benign)- seemed to do well but then just this morning she noted diffuse redness and possible infection She did not feel great over the weekend- no fever, just felt tired She otherwise has done great from her surgery, did not have any pain   Coumadin regimen: She is taking 2.5mg  4 days and 5mg  3 days  This is managed by Coumadin clinic, next appointment is in 2 days There are no problems to display for this patient.   Past Medical History:  Diagnosis Date   Anemia    Atrial fibrillation ([place])    on Coumadin   Breast mass, right    Cancer (HCC)    LEFT BREAST-Mastectomy   GERD (gastroesophageal reflux disease)    Hypertension    OAB (overactive bladder)    PONV (postoperative nausea and vomiting)     Past Surgical History:  Procedure Laterality Date   BREAST LUMPECTOMY WITH RADIOACTIVE SEED LOCALIZATION Right 10/25/2021   Procedure: RIGHT BREAST LUMPECTOMY WITH  RADIOACTIVE SEED LOCALIZATION;  Surgeon: Erroll Luna, MD;  Location: [facility];  Service: General;  Laterality: Right;   CHOLECYSTECTOMY     MASTECTOMY Left 2010   OVARIAN CYST REMOVAL     PARATHYROIDECTOMY      Social History   Tobacco Use   Smoking status: Never   Smokeless tobacco: Never  Substance Use Topics   Alcohol use: Never   Drug use: Never    No family history on file.  Allergies  Allergen Reactions   Bee Venom    Levaquin [Levofloxacin]     Medication list has been reviewed and updated.  Current Outpatient Medications on File Prior to Visit  Medication Sig Dispense Refill   amLODipine (NORVASC) 5 MG tablet Take 5 mg by mouth at bedtime.     cholestyramine (QUESTRAN) 4 g packet Take 4 g by mouth 3 (three) times daily with meals.     denosumab (PROLIA) 60 MG/ML SOSY injection Inject 60 mg into the skin every 6 (six) months.     ergocalciferol (VITAMIN D2) 1.25 MG (50000 UT) capsule Take 50,000 Units by mouth every 14 (fourteen) days.     HYDROcodone-acetaminophen (NORCO/VICODIN) 5-325 MG tablet Take 1 tablet by mouth every 6 (six) hours as needed for moderate pain. 15 tablet 0   irbesartan (AVAPRO) 300 MG tablet Take 300 mg by mouth daily.     metoprolol succinate (TOPROL-XL) 25 MG 24  hr tablet Take 12.5 mg by mouth in the morning and at bedtime.     omeprazole (PRILOSEC) 40 MG capsule Take 40 mg by mouth in the morning and at bedtime.     oxybutynin (DITROPAN XL) 15 MG 24 hr tablet Take 15 mg by mouth at bedtime.     warfarin (COUMADIN) 5 MG tablet Take 5 mg by mouth daily. TAKE 1/2 TAB ON MON-WED-FRI     No current facility-administered medications on file prior to visit.    Review of Systems:  As per HPI- otherwise negative.   Physical Examination: Vitals:   11/14/21 1108  BP: 132/70  Pulse: 80  Resp: 18  Temp: 98.1 F (36.7 C)  SpO2: 98%   Vitals:   11/14/21 1108  Weight: 201 lb (91.2 kg)   Body mass index is 42.01  kg/m. Ideal Body Weight:    GEN: no acute distress.  Obese, looks well HEENT: Atraumatic, Normocephalic.  Ears and Nose: No external deformity. CV: RRR, No M/G/R. No JVD. No thrill. No extra heart sounds. PULM: CTA B, no wheezes, crackles, rhonchi. No retractions. No resp. distress. No accessory muscle use. ABD: S, NT, ND EXTR: No c/c/e PSYCH: Normally interactive. Conversant.  There is diffuse redness consistent with cellulitis of the breast, centralized at lumpectomy site.  I am not able to appreciate any particular point tenderness or fluctuance to suggest an abscess    Assessment and Plan: Cellulitis of breast - Plan: cefTRIAXone (ROCEPHIN) injection 1 g, amoxicillin-clavulanate (AUGMENTIN) 875-125 MG tablet, CBC  Anticoagulation adequate - Plan: Protime-INR  Patient seen today with cellulitis of right breast following lumpectomy procedure.  I called and spoke with Dr. Josetta Huddle office, we made a plan for her to be seen by PA tomorrow.  For the time being we will give her a gram of IM Rocephin and started Augmentin Advised patient if this is getting worse, or if fever or other signs of increasing illness please contact me right away We also discussed potential INR increase with adding antibiotic to Coumadin.  We will check INR today, she will contact her Coumadin clinic and ask about pushing her next check back by 1 to 2 days  Otherwise, she plans to see me again soon to discuss more routine matters  Signed Lamar Blinks, MD  Received labs as below, called patient Results for orders placed or performed in visit on 11/14/21  CBC  Result Value Ref Range   WBC 12.0 (H) 4.0 - 10.5 K/uL   RBC 4.53 3.87 - 5.11 Mil/uL   Platelets 278.0 150.0 - 400.0 K/uL   Hemoglobin 11.0 (L) 12.0 - 15.0 g/dL   HCT 35.6 (L) 36.0 - 46.0 %   MCV 78.6 78.0 - 100.0 fl   MCHC 30.9 30.0 - 36.0 g/dL   RDW 15.9 (H) 11.5 - 15.5 %  Protime-INR  Result Value Ref Range   INR 1.6 (H) 0.8 - 1.0 ratio    Prothrombin Time 17.6 (H) 9.6 - 13.1 sec    Called and left detailed message.  Mild leukocytosis which fits with infection Also mild anemia, not of major concern itself but we do wonder why she is anemic.  I would like to further work this up when she is feeling better  Also, INR is 1.6.  I presume her range is 2-3.  I asked her to please contact her Coumadin management team and ask for recommendation.  If she is not able to get through to them I would suggest  increasing to 5 mg 4 times a week, 2.5 mg 3 days a week

## 2021-11-14 ENCOUNTER — Ambulatory Visit (INDEPENDENT_AMBULATORY_CARE_PROVIDER_SITE_OTHER): Payer: Medicare HMO | Admitting: Family Medicine

## 2021-11-14 VITALS — BP 132/70 | HR 80 | Temp 98.1°F | Resp 18 | Ht 58.5 in | Wt 201.0 lb

## 2021-11-14 DIAGNOSIS — Z7901 Long term (current) use of anticoagulants: Secondary | ICD-10-CM

## 2021-11-14 DIAGNOSIS — N61 Mastitis without abscess: Secondary | ICD-10-CM

## 2021-11-14 LAB — CBC
HCT: 35.6 % — ABNORMAL LOW (ref 36.0–46.0)
Hemoglobin: 11 g/dL — ABNORMAL LOW (ref 12.0–15.0)
MCHC: 30.9 g/dL (ref 30.0–36.0)
MCV: 78.6 fl (ref 78.0–100.0)
Platelets: 278 10*3/uL (ref 150.0–400.0)
RBC: 4.53 Mil/uL (ref 3.87–5.11)
RDW: 15.9 % — ABNORMAL HIGH (ref 11.5–15.5)
WBC: 12 10*3/uL — ABNORMAL HIGH (ref 4.0–10.5)

## 2021-11-14 LAB — PROTIME-INR
INR: 1.6 ratio — ABNORMAL HIGH (ref 0.8–1.0)
Prothrombin Time: 17.6 s — ABNORMAL HIGH (ref 9.6–13.1)

## 2021-11-14 MED ORDER — CEFTRIAXONE SODIUM 1 G IJ SOLR
1.0000 g | Freq: Once | INTRAMUSCULAR | Status: AC
  Administered 2021-11-14: 1 g via INTRAMUSCULAR

## 2021-11-14 MED ORDER — AMOXICILLIN-POT CLAVULANATE 875-125 MG PO TABS: 1.0000 | ORAL_TABLET | Freq: Two times a day (BID) | ORAL | 0 refills | Status: DC

## 2021-11-14 NOTE — Patient Instructions (Addendum)
It was nice to see you today!  I am sorry you have this infection We will give you a shot of antibiotics, and then please start on the oral antibiotic tonight or tomorrow am  You have an appt with Dr Josetta Huddle PA tomorrow at 1:45- he is not in this week but will see you next week for follow-up  I will check your INR today- we do need to watch for any bleeding or bruising when using antibiotics with coumadin  Please call your coumadin/ INR clinic and see if they might be able to check your INR this Thursday or Friday- this might make more sense than Wednesday as we are looking for any change due to antibiotic use   If you are getting worse or running a fever alert me right away

## 2021-11-15 DIAGNOSIS — Z9889 Other specified postprocedural states: Secondary | ICD-10-CM | POA: Diagnosis not present

## 2021-11-16 ENCOUNTER — Encounter (HOSPITAL_COMMUNITY): Payer: Self-pay

## 2021-11-16 DIAGNOSIS — I480 Paroxysmal atrial fibrillation: Secondary | ICD-10-CM | POA: Diagnosis not present

## 2021-11-16 DIAGNOSIS — Z5181 Encounter for therapeutic drug level monitoring: Secondary | ICD-10-CM | POA: Diagnosis not present

## 2021-11-16 DIAGNOSIS — Z7901 Long term (current) use of anticoagulants: Secondary | ICD-10-CM | POA: Diagnosis not present

## 2021-11-22 DIAGNOSIS — R791 Abnormal coagulation profile: Secondary | ICD-10-CM | POA: Diagnosis not present

## 2021-11-22 DIAGNOSIS — Z5181 Encounter for therapeutic drug level monitoring: Secondary | ICD-10-CM | POA: Diagnosis not present

## 2021-11-22 DIAGNOSIS — I480 Paroxysmal atrial fibrillation: Secondary | ICD-10-CM | POA: Diagnosis not present

## 2021-11-22 DIAGNOSIS — Z7901 Long term (current) use of anticoagulants: Secondary | ICD-10-CM | POA: Diagnosis not present

## 2021-12-14 ENCOUNTER — Encounter (HOSPITAL_COMMUNITY): Payer: Self-pay

## 2021-12-20 DIAGNOSIS — Z7901 Long term (current) use of anticoagulants: Secondary | ICD-10-CM | POA: Diagnosis not present

## 2021-12-20 DIAGNOSIS — I480 Paroxysmal atrial fibrillation: Secondary | ICD-10-CM | POA: Diagnosis not present

## 2021-12-20 DIAGNOSIS — Z5181 Encounter for therapeutic drug level monitoring: Secondary | ICD-10-CM | POA: Diagnosis not present

## 2021-12-27 DIAGNOSIS — I480 Paroxysmal atrial fibrillation: Secondary | ICD-10-CM | POA: Diagnosis not present

## 2021-12-27 DIAGNOSIS — I10 Essential (primary) hypertension: Secondary | ICD-10-CM | POA: Diagnosis not present

## 2022-01-05 DIAGNOSIS — E210 Primary hyperparathyroidism: Secondary | ICD-10-CM | POA: Insufficient documentation

## 2022-01-05 DIAGNOSIS — I482 Chronic atrial fibrillation, unspecified: Secondary | ICD-10-CM | POA: Insufficient documentation

## 2022-01-05 DIAGNOSIS — I10 Essential (primary) hypertension: Secondary | ICD-10-CM | POA: Insufficient documentation

## 2022-01-05 DIAGNOSIS — C50919 Malignant neoplasm of unspecified site of unspecified female breast: Secondary | ICD-10-CM | POA: Insufficient documentation

## 2022-01-05 DIAGNOSIS — I4891 Unspecified atrial fibrillation: Secondary | ICD-10-CM | POA: Insufficient documentation

## 2022-01-05 NOTE — Patient Instructions (Addendum)
It was good to see you again today, assuming all is well please see me in about 6 months ?Please do consider getting your shingles series at your pharmacy ?Tetanus vaccine given today ?We will check your iron, blood counts and cholesterol- please compete and then return to [place] at your convenience  ?

## 2022-01-05 NOTE — Progress Notes (Addendum)
[facility] ?[address], [address] ?[place], [place] [address] ?336 [phone] ?Fax [phone] ? ?Date:  01/11/2022  ? ?Name:  Loretta Gates   DOB:  1936-01-30   MRN:  [id number] ? ?PCP:  Loretta Mclean, MD  ? ? ?Chief Complaint: 2 month follow up (Pt does have some questions. ) ? ? ?History of Present Illness: ? ?Loretta Gates is a 86 y.o. very pleasant female patient who presents with the following: ? ?Patient seen today for follow-up, I saw her as a new patient in February for breast cellulitis. Her previous PCP has retired, her daughter is also my patient ? ?History of hypertension, hyperparathyroidism status post parathyroidectomy, a fib.  She sees endocrinology annually- Dr Posey Pronto  with [facility] ?Left-sided breast cancer treated mastectomy in 2008 ?At her last visit she had recently undergone a right breast lumpectomy with benign results, the area was infected-we made a short-term follow-up plan with her surgeon and started her on IM Rocephin and Augmentin ?She is recovered from this now- it did take some time and several visits with her breast surgeon ? ?She goes to Coumadin clinic ?Seen by cardiology March 21-[facility] ?1. Paroxysmal atrial fibrillation (HCC)  ?2. Essential hypertension  ?Atrial Fibrillation, in Sinus Rhythm ?HTN, on therapy ?She has rare complaints of chest discomfort. Marland Kitchen  ?Doing well overall, will continue with current plans ? ?Bone density scan- 10.2022, osteopenia  ?She is on prolia- she thinks using for 3-4 yeas  ?COVID-19 booster- UTD ?Shingrix- recommended at pharmacy ?Tetanus may be due- would like to update  ?Recent lab work on chart-  ?Pt notes mild anemia for perhaps 2 years  ?She thinks she did a colon in 2019 and it was ok per her report, she states no polyps were noted. ? ?She has not noted any blood in her stool.  She was evaluated for hematuria a few years ago, states her evaluation was benign. ? ?She has noted more difficulty  walking the last few years mostly due to her knees.  She notes she was always quite active as a younger person, but her knee pain makes it more difficult for her to get around ?Patient Active Problem List  ? Diagnosis Date Noted  ? Atrial fibrillation ([place]) 01/05/2022  ? Breast cancer ([place]) 01/05/2022  ? Primary hyperparathyroidism ([place]) 01/05/2022  ? Essential hypertension 01/05/2022  ? ? ?Past Medical History:  ?Diagnosis Date  ? Anemia   ? Atrial fibrillation ([place])   ? on Coumadin  ? Breast mass, right   ? Cancer [facility])   ? LEFT BREAST-Mastectomy  ? GERD (gastroesophageal reflux disease)   ? Hypertension   ? OAB (overactive bladder)   ? PONV (postoperative nausea and vomiting)   ? ? ?Past Surgical History:  ?Procedure Laterality Date  ? BREAST LUMPECTOMY WITH RADIOACTIVE SEED LOCALIZATION Right 10/25/2021  ? Procedure: RIGHT BREAST LUMPECTOMY WITH RADIOACTIVE SEED LOCALIZATION;  Surgeon: Erroll Luna, MD;  Location: [facility];  Service: General;  Laterality: Right;  ? CHOLECYSTECTOMY    ? MASTECTOMY Left 2010  ? OVARIAN CYST REMOVAL    ? PARATHYROIDECTOMY    ? ? ?Social History  ? ?Tobacco Use  ? Smoking status: Never  ? Smokeless tobacco: Never  ?Substance Use Topics  ? Alcohol use: Never  ? Drug use: Never  ? ? ?No family history on file. ? ?Allergies  ?Allergen Reactions  ? Bee Venom   ?  Levaquin [Levofloxacin]   ? ? ?Medication list has been reviewed and updated. ? ?Current Outpatient Medications on File Prior to Visit  ?Medication Sig Dispense Refill  ? amLODipine (NORVASC) 5 MG tablet Take 5 mg by mouth at bedtime.    ? cholestyramine (QUESTRAN) 4 g packet Take 4 g by mouth 3 (three) times daily with meals.    ? denosumab (PROLIA) 60 MG/ML SOSY injection Inject 60 mg into the skin every 6 (six) months.    ? ergocalciferol (VITAMIN D2) 1.25 MG (50000 UT) capsule Take 50,000 Units by mouth every 14 (fourteen) days.    ? HYDROcodone-acetaminophen (NORCO/VICODIN) 5-325 MG tablet Take 1 tablet  by mouth every 6 (six) hours as needed for moderate pain. 15 tablet 0  ? irbesartan (AVAPRO) 300 MG tablet Take 300 mg by mouth daily.    ? metoprolol succinate (TOPROL-XL) 25 MG 24 hr tablet Take 12.5 mg by mouth in the morning and at bedtime.    ? omeprazole (PRILOSEC) 40 MG capsule Take 40 mg by mouth in the morning and at bedtime.    ? oxybutynin (DITROPAN XL) 15 MG 24 hr tablet Take 15 mg by mouth at bedtime.    ? warfarin (COUMADIN) 5 MG tablet Take 5 mg by mouth daily. TAKE 1/2 TAB ON MON-WED-FRI    ? ?No current facility-administered medications on file prior to visit.  ? ? ?Review of Systems: ? ?As per HPI- otherwise negative. ? ? ?Physical Examination: ?Vitals:  ? 01/11/22 1024  ?BP: 140/78  ?Pulse: 73  ?Resp: 18  ?Temp: 98 ?F (36.7 ?C)  ?SpO2: 98%  ? ?Vitals:  ? 01/11/22 1024  ?Weight: 198 lb 6.4 oz (90 kg)  ?Height: 4' 10.5" (1.486 m)  ? ?Body mass index is 40.76 kg/m?. ?Ideal Body Weight: Weight in (lb) to have BMI = 25: 121.4 ? ?GEN: no acute distress.  Obese, looks well ?HEENT: Atraumatic, Normocephalic.  Bilateral TM wnl, oropharynx normal.  PEERL,EOMI.   ?Ears and Nose: No external deformity. ?CV: RRR, No M/G/R. No JVD. No thrill. No extra heart sounds. ?PULM: CTA B, no wheezes, crackles, rhonchi. No retractions. No resp. distress. No accessory muscle use. ?EXTR: No c/c/mild lower extremity edema which patient states is stable ?PSYCH: Normally interactive. Conversant.  ?Abrasion of the left wrist, no wound care needed but will update tetanus ? ?Assessment and Plan: ?Mild anemia - Plan: IFOBT POC (occult bld, rslt in office), CBC, Ferritin ? ?Atrial fibrillation, unspecified type ([place]) ? ?Malignant neoplasm of left female breast, unspecified estrogen receptor status, unspecified site of breast ([place]) ? ?Primary hyperparathyroidism ([place]) ? ?Essential hypertension ? ?Chronic pain of both knees - Plan: Ambulatory referral to Orthopedic Surgery ? ?Open arm wound, left, initial encounter - Plan: Td vaccine  greater than or equal to 7yo preservative free IM ? ?Screening for hyperlipidemia - Plan: Lipid panel ? ?Following up today.  We will obtain CBC, ferritin, occult blood stool test for mild anemia ?Currently in sinus rhythm.  Patient states her cardiologist has discussed possibly stopping Coumadin, she is not sure what she wishes to do.  We discussed this today, certainly if this is her decision ?Blood pressure and heart rate under good control on current medication ?Referral to orthopedics to evaluate her knees, we wonder if a steroid injection may be helpful ?Update tetanus, recommend Shingrix at pharmacy ?Will plan further follow- up pending labs. ? ? ?Signed ?Lamar Blinks, MD ? ?Received her labs as below-we will need to call patient about quite  low iron, potentially referral to hematology.  She does not have MyChart ? ?Called pt 4/6- she is ok with seeing hematology, referral placed  ? ?Results for orders placed or performed in visit on 01/11/22  ?CBC  ?Result Value Ref Range  ? WBC 8.8 4.0 - 10.5 K/uL  ? RBC 4.76 3.87 - 5.11 Mil/uL  ? Platelets 285.0 150.0 - 400.0 K/uL  ? Hemoglobin 11.3 (L) 12.0 - 15.0 g/dL  ? HCT 35.8 (L) 36.0 - 46.0 %  ? MCV 75.2 (L) 78.0 - 100.0 fl  ? MCHC 31.6 30.0 - 36.0 g/dL  ? RDW 17.9 (H) 11.5 - 15.5 %  ?Ferritin  ?Result Value Ref Range  ? Ferritin 6.1 (L) 10.0 - 291.0 ng/mL  ?Lipid panel  ?Result Value Ref Range  ? Cholesterol 136 0 - 200 mg/dL  ? Triglycerides 170.0 (H) 0.0 - 149.0 mg/dL  ? HDL 44.80 >39.00 mg/dL  ? VLDL 34.0 0.0 - 40.0 mg/dL  ? LDL Cholesterol 57 0 - 99 mg/dL  ? Total CHOL/HDL Ratio 3   ? NonHDL 90.90   ? ? ? ?

## 2022-01-11 ENCOUNTER — Ambulatory Visit (INDEPENDENT_AMBULATORY_CARE_PROVIDER_SITE_OTHER): Payer: Medicare HMO | Admitting: Family Medicine

## 2022-01-11 VITALS — BP 140/78 | HR 73 | Temp 98.0°F | Resp 18 | Ht 58.5 in | Wt 198.4 lb

## 2022-01-11 DIAGNOSIS — Z23 Encounter for immunization: Secondary | ICD-10-CM | POA: Diagnosis not present

## 2022-01-11 DIAGNOSIS — I4891 Unspecified atrial fibrillation: Secondary | ICD-10-CM | POA: Diagnosis not present

## 2022-01-11 DIAGNOSIS — G8929 Other chronic pain: Secondary | ICD-10-CM

## 2022-01-11 DIAGNOSIS — M25561 Pain in right knee: Secondary | ICD-10-CM

## 2022-01-11 DIAGNOSIS — M25562 Pain in left knee: Secondary | ICD-10-CM

## 2022-01-11 DIAGNOSIS — E210 Primary hyperparathyroidism: Secondary | ICD-10-CM

## 2022-01-11 DIAGNOSIS — C50912 Malignant neoplasm of unspecified site of left female breast: Secondary | ICD-10-CM

## 2022-01-11 DIAGNOSIS — D649 Anemia, unspecified: Secondary | ICD-10-CM

## 2022-01-11 DIAGNOSIS — S41102A Unspecified open wound of left upper arm, initial encounter: Secondary | ICD-10-CM

## 2022-01-11 DIAGNOSIS — E611 Iron deficiency: Secondary | ICD-10-CM

## 2022-01-11 DIAGNOSIS — Z13220 Encounter for screening for lipoid disorders: Secondary | ICD-10-CM | POA: Diagnosis not present

## 2022-01-11 DIAGNOSIS — I10 Essential (primary) hypertension: Secondary | ICD-10-CM

## 2022-01-11 LAB — LIPID PANEL
Cholesterol: 136 mg/dL (ref 0–200)
HDL: 44.8 mg/dL (ref >39.00)
LDL Cholesterol: 57 mg/dL (ref 0–99)
NonHDL: 90.9
Total CHOL/HDL Ratio: 3
Triglycerides: 170 mg/dL — ABNORMAL HIGH (ref 0.0–149.0)
VLDL: 34 mg/dL (ref 0.0–40.0)

## 2022-01-11 LAB — CBC
HCT: 35.8 % — ABNORMAL LOW (ref 36.0–46.0)
Hemoglobin: 11.3 g/dL — ABNORMAL LOW (ref 12.0–15.0)
MCHC: 31.6 g/dL (ref 30.0–36.0)
MCV: 75.2 fl — ABNORMAL LOW (ref 78.0–100.0)
Platelets: 285 10*3/uL (ref 150.0–400.0)
RBC: 4.76 Mil/uL (ref 3.87–5.11)
RDW: 17.9 % — ABNORMAL HIGH (ref 11.5–15.5)
WBC: 8.8 10*3/uL (ref 4.0–10.5)

## 2022-01-11 LAB — FERRITIN: Ferritin: 6.1 ng/mL — ABNORMAL LOW (ref 10.0–291.0)

## 2022-01-12 NOTE — Addendum Note (Signed)
Addended by: Lamar Blinks C on: 01/12/2022 09:24 AM ? ? Modules accepted: Orders ? ?

## 2022-01-13 ENCOUNTER — Inpatient Hospital Stay: Payer: Medicare HMO | Admitting: Family

## 2022-01-13 ENCOUNTER — Other Ambulatory Visit: Payer: Self-pay | Admitting: Family

## 2022-01-13 ENCOUNTER — Inpatient Hospital Stay: Payer: Medicare HMO | Attending: Hematology & Oncology

## 2022-01-13 ENCOUNTER — Encounter: Payer: Self-pay | Admitting: Family

## 2022-01-13 VITALS — BP 159/71 | HR 62 | Temp 98.2°F | Resp 18 | Ht <= 58 in | Wt 198.0 lb

## 2022-01-13 DIAGNOSIS — D509 Iron deficiency anemia, unspecified: Secondary | ICD-10-CM | POA: Insufficient documentation

## 2022-01-13 DIAGNOSIS — D649 Anemia, unspecified: Secondary | ICD-10-CM

## 2022-01-13 LAB — FERRITIN: Ferritin: 8 ng/mL — ABNORMAL LOW (ref 11–307)

## 2022-01-13 LAB — CMP (CANCER CENTER ONLY)
ALT: 24 U/L (ref 0–44)
AST: 19 U/L (ref 15–41)
Albumin: 4.4 g/dL (ref 3.5–5.0)
Alkaline Phosphatase: 47 U/L (ref 38–126)
Anion gap: 8 (ref 5–15)
BUN: 18 mg/dL (ref 8–23)
CO2: 27 mmol/L (ref 22–32)
Calcium: 10.8 mg/dL — ABNORMAL HIGH (ref 8.9–10.3)
Chloride: 105 mmol/L (ref 98–111)
Creatinine: 0.71 mg/dL (ref 0.44–1.00)
GFR, Estimated: >60 mL/min (ref >60)
Glucose, Bld: 109 mg/dL — ABNORMAL HIGH (ref 70–99)
Potassium: 3.9 mmol/L (ref 3.5–5.1)
Sodium: 140 mmol/L (ref 135–145)
Total Bilirubin: 0.7 mg/dL (ref 0.3–1.2)
Total Protein: 6.8 g/dL (ref 6.5–8.1)

## 2022-01-13 LAB — CBC WITH DIFFERENTIAL (CANCER CENTER ONLY)
Abs Immature Granulocytes: 0.03 10*3/uL (ref 0.00–0.07)
Basophils Absolute: 0.1 10*3/uL (ref 0.0–0.1)
Basophils Relative: 1 %
Eosinophils Absolute: 0.3 10*3/uL (ref 0.0–0.5)
Eosinophils Relative: 3 %
HCT: 37.2 % (ref 36.0–46.0)
Hemoglobin: 11.2 g/dL — ABNORMAL LOW (ref 12.0–15.0)
Immature Granulocytes: 0 %
Lymphocytes Relative: 19 %
Lymphs Abs: 1.6 10*3/uL (ref 0.7–4.0)
MCH: 23.3 pg — ABNORMAL LOW (ref 26.0–34.0)
MCHC: 30.1 g/dL (ref 30.0–36.0)
MCV: 77.5 fL — ABNORMAL LOW (ref 80.0–100.0)
Monocytes Absolute: 0.7 10*3/uL (ref 0.1–1.0)
Monocytes Relative: 7 %
Neutro Abs: 6.1 10*3/uL (ref 1.7–7.7)
Neutrophils Relative %: 70 %
Platelet Count: 289 10*3/uL (ref 150–400)
RBC: 4.8 MIL/uL (ref 3.87–5.11)
RDW: 16.5 % — ABNORMAL HIGH (ref 11.5–15.5)
WBC Count: 8.8 10*3/uL (ref 4.0–10.5)
nRBC: 0 % (ref 0.0–0.2)

## 2022-01-13 LAB — IRON AND IRON BINDING CAPACITY (CC-WL,HP ONLY)
Iron: 54 ug/dL (ref 28–170)
Saturation Ratios: 11 % (ref 10.4–31.8)
TIBC: 503 ug/dL — ABNORMAL HIGH (ref 250–450)
UIBC: 449 ug/dL — ABNORMAL HIGH (ref 148–442)

## 2022-01-13 LAB — RETICULOCYTES
Immature Retic Fract: 28.6 % — ABNORMAL HIGH (ref 2.3–15.9)
RBC.: 4.81 MIL/uL (ref 3.87–5.11)
Retic Count, Absolute: 65.9 10*3/uL (ref 19.0–186.0)
Retic Ct Pct: 1.4 % (ref 0.4–3.1)

## 2022-01-13 LAB — SAVE SMEAR(SSMR), FOR PROVIDER SLIDE REVIEW

## 2022-01-13 LAB — LACTATE DEHYDROGENASE: LDH: 148 U/L (ref 98–192)

## 2022-01-13 NOTE — Progress Notes (Signed)
Hematology/Oncology Consultation  ? ?Name: Loretta Gates      MRN: [id number]    Location: Room/bed info not found  Date: 01/13/2022 Time:11:01 AM ? ? ?REFERRING PHYSICIAN: Lamar Blinks, MD ? ?REASON FOR CONSULT:  Iron deficiency  ?  ?DIAGNOSIS: Iron deficiency anemia  ? ?HISTORY OF PRESENT ILLNESS:  [name] is a very pleasant 21 to caucasian female with recent diagnosis of iron deficiency anemia.  ?She is symptomatic with fatigue and occasional SOB with exertion.  ?She has not noticed any recent obvious blood loss. She has an occasional nose bleed. No abnormal bruising or petechiae.  ?She is on coumadin for atrial fib.  ?She has supplies for occult stool check with her PCP. We will see what this shows.  ?She had her EGD and colonoscopy in February 2022. EGD biopsies were negative. Colonoscopy showed mild to moderate left sided diverticulosis and medium internal hemorrhoids.  ?Typically she states that she is quite active and enjoys walking and swimming.  ?No known family history of anemia.  ?She has remote history of left breast cancer in 2008. This was treated with mastectomy.  ?She had a right breast lumpectomy in March of this year. Thankfully results were benign but she did have some issue with her site healing. This resolved with IM Rocephin and Augmentin.  ?No history of diabetes or thyroid disease.  ?She has 2 children and no history of miscarriage.  ?No fever, chills, n/v, cough, rash, dizziness, chest pain, palpitations, abdominal pain or changes in bowel or bladder habits.  ?She has chronic swelling in her lower extremities that she states is unchanged from baseline. It does worsen with the summer heat.  ?She notes leg cramps at night and states that pick juice or mustard helps this resolve.  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  ?She ambulates with a cane for added support.  ?No falls or syncope reported.  ? ?She quit smoking in the late 1960's.  ?Rare ETOH socially. No  recreational drug use.  ?She is eating well and doing her best to stay well hydrated throughout the day. Weight is stable at 198 lbs.  ? ? ?ROS: All other 10 point review of systems is negative.  ? ?PAST MEDICAL HISTORY:   ?Past Medical History:  ?Diagnosis Date  ? Anemia   ? Atrial fibrillation ([place])   ? on Coumadin  ? Breast mass, right   ? Cancer [facility])   ? LEFT BREAST-Mastectomy  ? GERD (gastroesophageal reflux disease)   ? Hypertension   ? OAB (overactive bladder)   ? PONV (postoperative nausea and vomiting)   ? ? ?ALLERGIES: ?Allergies  ?Allergen Reactions  ? Bee Venom   ? Levaquin [Levofloxacin]   ? ?   ?MEDICATIONS:  ?Current Outpatient Medications on File Prior to Visit  ?Medication Sig Dispense Refill  ? amLODipine (NORVASC) 5 MG tablet Take 5 mg by mouth at bedtime.    ? cholestyramine (QUESTRAN) 4 g packet Take 4 g by mouth 3 (three) times daily with meals.    ? denosumab (PROLIA) 60 MG/ML SOSY injection Inject 60 mg into the skin every 6 (six) months.    ? ergocalciferol (VITAMIN D2) 1.25 MG (50000 UT) capsule Take 50,000 Units by mouth every 14 (fourteen) days.    ? HYDROcodone-acetaminophen (NORCO/VICODIN) 5-325 MG tablet Take 1 tablet by mouth every 6 (six) hours as needed for moderate pain. 15 tablet 0  ? irbesartan (AVAPRO) 300 MG tablet Take 300 mg  by mouth daily.    ? metoprolol succinate (TOPROL-XL) 25 MG 24 hr tablet Take 12.5 mg by mouth in the morning and at bedtime.    ? omeprazole (PRILOSEC) 40 MG capsule Take 40 mg by mouth in the morning and at bedtime.    ? oxybutynin (DITROPAN XL) 15 MG 24 hr tablet Take 15 mg by mouth at bedtime.    ? warfarin (COUMADIN) 5 MG tablet Take 5 mg by mouth daily. TAKE 1/2 TAB ON MON-WED-FRI    ? ?No current facility-administered medications on file prior to visit.  ? ?  ?PAST SURGICAL HISTORY ?Past Surgical History:  ?Procedure Laterality Date  ? BREAST LUMPECTOMY WITH RADIOACTIVE SEED LOCALIZATION Right 10/25/2021  ? Procedure: RIGHT BREAST LUMPECTOMY WITH  RADIOACTIVE SEED LOCALIZATION;  Surgeon: Erroll Luna, MD;  Location: [facility];  Service: General;  Laterality: Right;  ? CHOLECYSTECTOMY    ? MASTECTOMY Left 2010  ? OVARIAN CYST REMOVAL    ? PARATHYROIDECTOMY    ? ? ?FAMILY HISTORY: ?History reviewed. No pertinent family history. ? ?SOCIAL HISTORY: ? reports that she has never smoked. She has never used smokeless tobacco. She reports that she does not drink alcohol and does not use drugs. ? ?PERFORMANCE STATUS: ?The patient's performance status is 1 - Symptomatic but completely ambulatory ? ?PHYSICAL EXAM: ?Most Recent Vital Signs: Blood pressure (!) 159/71, pulse 62, temperature 98.2 ?F (36.8 ?C), temperature source Oral, resp. rate 18, height '4\' 10"'$  (1.473 m), weight 198 lb (89.8 kg), SpO2 98 %. ?BP (!) 159/71 (BP Location: Right Arm, Patient Position: Sitting)   Pulse 62   Temp 98.2 ?F (36.8 ?C) (Oral)   Resp 18   Ht '4\' 10"'$  (1.473 m)   Wt 198 lb (89.8 kg)   SpO2 98%   BMI 41.38 kg/m?  ? ?General Appearance:    Alert, cooperative, no distress, appears stated age  ?Head:    Normocephalic, without obvious abnormality, atraumatic  ?Eyes:    PERRL, conjunctiva/corneas clear, EOM's intact, fundi  ?  benign, both eyes  ?   ?   ?Throat:   Lips, mucosa, and tongue normal; teeth and gums normal  ?Neck:   Supple, symmetrical, trachea midline, no adenopathy;  ?  thyroid:  no enlargement/tenderness/nodules; no carotid ?  bruit or JVD  ?Back:     Symmetric, no curvature, ROM normal, no CVA tenderness  ?Lungs:     Clear to auscultation bilaterally, respirations unlabored  ?Chest Wall:    No tenderness or deformity  ? Heart:    Regular rate and rhythm, S1 and S2 normal, no murmur, rub   or gallop  ?   ?Abdomen:     Soft, non-tender, bowel sounds active all four quadrants,  ?  no masses, no organomegaly  ?   ?   ?Extremities:   Extremities normal, atraumatic, no cyanosis or edema  ?Pulses:   2+ and symmetric all extremities  ?Skin:   Skin color,  texture, turgor normal, no rashes or lesions  ?Lymph nodes:   Cervical, supraclavicular, and axillary nodes normal  ?Neurologic:   CNII-XII intact, normal strength, sensation and reflexes  ?  throughout  ? ? ?LABORATORY DATA:  ?Results for orders placed or performed in visit on 01/13/22 (from the past 48 hour(s))  ?Save Smear [facility])     Status: None  ? Collection Time: 01/13/22 10:15 AM  ?Result Value Ref Range  ? Smear Review SMEAR STAINED AND AVAILABLE FOR REVIEW   ?  Comment: Performed at [facility] at [facility], [address] [place], [place] [address]  ?CMP ([place] only)     Status: Abnormal  ? Collection Time: 01/13/22 10:15 AM  ?Result Value Ref Range  ? Sodium 140 135 - 145 mmol/L  ? Potassium 3.9 3.5 - 5.1 mmol/L  ? Chloride 105 98 - 111 mmol/L  ? CO2 27 22 - 32 mmol/L  ? Glucose, Bld 109 (H) 70 - 99 mg/dL  ?  Comment: Glucose reference range applies only to samples taken after fasting for at least 8 hours.  ? BUN 18 8 - 23 mg/dL  ? Creatinine 0.71 0.44 - 1.00 mg/dL  ? Calcium 10.8 (H) 8.9 - 10.3 mg/dL  ? Total Protein 6.8 6.5 - 8.1 g/dL  ? Albumin 4.4 3.5 - 5.0 g/dL  ? AST 19 15 - 41 U/L  ? ALT 24 0 - 44 U/L  ? Alkaline Phosphatase 47 38 - 126 U/L  ? Total Bilirubin 0.7 0.3 - 1.2 mg/dL  ? GFR, Estimated >60 >60 mL/min  ?  Comment: (NOTE) ?Calculated using the CKD-EPI Creatinine Equation (2021) ?  ? Anion gap 8 5 - 15  ?  Comment: Performed at [facility] Lab at [facility], [address], [place], [place] [address]  ?CBC with Differential (Cancer Center Only)     Status: Abnormal  ? Collection Time: 01/13/22 10:15 AM  ?Result Value Ref Range  ? WBC Count 8.8 4.0 - 10.5 K/uL  ? RBC 4.80 3.87 - 5.11 MIL/uL  ? Hemoglobin 11.2 (L) 12.0 - 15.0 g/dL  ? HCT 37.2 36.0 - 46.0 %  ? MCV 77.5 (L) 80.0 - 100.0 fL  ? MCH 23.3 (L) 26.0 - 34.0 pg  ? MCHC 30.1 30.0 - 36.0 g/dL  ? RDW 16.5 (H) 11.5 - 15.5 %  ? Platelet Count 289 150 - 400 K/uL  ? nRBC 0.0 0.0  - 0.2 %  ? Neutrophils Relative % 70 %  ? Neutro Abs 6.1 1.7 - 7.7 K/uL  ? Lymphocytes Relative 19 %  ? Lymphs Abs 1.6 0.7 - 4.0 K/uL  ? Monocytes Relative 7 %  ? Monocytes Absolute 0.7 0.1 - 1.0 K/uL  ? Eosinophils Relativ

## 2022-01-14 LAB — ERYTHROPOIETIN: Erythropoietin: 75.5 m[IU]/mL — ABNORMAL HIGH (ref 2.6–18.5)

## 2022-01-16 ENCOUNTER — Telehealth: Payer: Self-pay | Admitting: *Deleted

## 2022-01-16 NOTE — Telephone Encounter (Signed)
Per scheduling message Judson Roch - Called and lvm with patient's daughter to get her mother scheduled for (2) doses of iron.  ?

## 2022-01-18 ENCOUNTER — Telehealth: Payer: Self-pay | Admitting: *Deleted

## 2022-01-18 NOTE — Telephone Encounter (Signed)
Pt called stating she messed up her IFOB kit and needs to pick up another one.  Kit placed at front desk file for pt or family to pick up. ?

## 2022-01-18 NOTE — Telephone Encounter (Signed)
Per 01/13/22 los - called and gave upcoming appointments - confirmed ?

## 2022-01-20 ENCOUNTER — Other Ambulatory Visit (INDEPENDENT_AMBULATORY_CARE_PROVIDER_SITE_OTHER): Payer: Medicare HMO

## 2022-01-20 DIAGNOSIS — D649 Anemia, unspecified: Secondary | ICD-10-CM

## 2022-01-20 LAB — FECAL OCCULT BLOOD, IMMUNOCHEMICAL: Fecal Occult Bld: NEGATIVE

## 2022-01-20 NOTE — Addendum Note (Signed)
Addended by: Kelle Darting A on: 01/20/2022 08:59 AM ? ? Modules accepted: Orders ? ?

## 2022-01-23 ENCOUNTER — Inpatient Hospital Stay: Payer: Medicare HMO

## 2022-01-23 VITALS — BP 165/64 | HR 59 | Temp 98.6°F | Resp 18

## 2022-01-23 DIAGNOSIS — D509 Iron deficiency anemia, unspecified: Secondary | ICD-10-CM

## 2022-01-23 MED ORDER — SODIUM CHLORIDE 0.9 % IV SOLN: Freq: Once | INTRAVENOUS | Status: AC

## 2022-01-23 MED ORDER — SODIUM CHLORIDE 0.9 % IV SOLN
125.0000 mg | Freq: Once | INTRAVENOUS | Status: AC
  Administered 2022-01-23: 125 mg via INTRAVENOUS
  Filled 2022-01-23: qty 125

## 2022-01-24 ENCOUNTER — Ambulatory Visit: Payer: Medicare HMO | Admitting: Orthopaedic Surgery

## 2022-01-24 ENCOUNTER — Ambulatory Visit: Payer: Medicare HMO

## 2022-01-24 ENCOUNTER — Ambulatory Visit (INDEPENDENT_AMBULATORY_CARE_PROVIDER_SITE_OTHER): Payer: Medicare HMO

## 2022-01-24 VITALS — Ht <= 58 in | Wt 198.0 lb

## 2022-01-24 DIAGNOSIS — M1712 Unilateral primary osteoarthritis, left knee: Secondary | ICD-10-CM

## 2022-01-24 DIAGNOSIS — M1711 Unilateral primary osteoarthritis, right knee: Secondary | ICD-10-CM

## 2022-01-24 DIAGNOSIS — M170 Bilateral primary osteoarthritis of knee: Secondary | ICD-10-CM | POA: Diagnosis not present

## 2022-01-24 MED ORDER — LIDOCAINE HCL 1 % IJ SOLN
3.0000 mL | INTRAMUSCULAR | Status: AC | PRN
  Administered 2022-01-24: 3 mL

## 2022-01-24 MED ORDER — BUPIVACAINE HCL 0.25 % IJ SOLN
0.6600 mL | INTRAMUSCULAR | Status: AC | PRN
  Administered 2022-01-24: .66 mL via INTRA_ARTICULAR

## 2022-01-24 MED ORDER — METHYLPREDNISOLONE ACETATE 40 MG/ML IJ SUSP
13.3300 mg | INTRAMUSCULAR | Status: AC | PRN
  Administered 2022-01-24: 13.33 mg via INTRA_ARTICULAR

## 2022-01-24 NOTE — Progress Notes (Signed)
? ?Office Visit Note ?  ?Patient: Loretta Gates           ?Date of Birth: 20-Oct-1935           ?MRN: [id number] ?Visit Date: 01/24/2022 ?             ?Requested by: Darreld Mclean, MD ?[address] ?[place],  [place] [address] ?PCP: Darreld Mclean, MD ? ? ?Assessment & Plan: ?Visit Diagnoses:  ?1. Bilateral primary osteoarthritis of knee   ? ? ?Plan: Impression is bilateral knee advanced generative joint disease.  Today, we discussed proceeding with cortisone injections for which she is agreeable to.  Also provided her with a viscosupplementation injection handout.  Follow-up with [place] as needed.  Call with concerns or questions. ? ?Follow-Up Instructions: Return if symptoms worsen or fail to improve.  ? ?Orders:  ?Orders Placed This Encounter  ?Procedures  ? Large Joint Inj: bilateral knee  ? XR KNEE 3 VIEW RIGHT  ? XR KNEE 3 VIEW LEFT  ? ?No orders of the defined types were placed in this encounter. ? ? ? ? Procedures: ?Large Joint Inj: bilateral knee on 01/24/2022 3:48 PM ?Indications: pain ?Details: 22 G needle, anterolateral approach ?Medications (Right): 0.66 mL bupivacaine 0.25 %; 3 mL lidocaine 1 %; 13.33 mg methylPREDNISolone acetate 40 MG/ML ?Medications (Left): 0.66 mL bupivacaine 0.25 %; 3 mL lidocaine 1 %; 13.33 mg methylPREDNISolone acetate 40 MG/ML ? ? ? ? ?Clinical Data: ?No additional findings. ? ? ?Subjective: ?Chief Complaint  ?Patient presents with  ? Right Knee - Pain  ? Left Knee - Pain  ? ? ?HPI patient is a pleasant 86 year old female who comes in today with bilateral knee pain both equally as bad.  The pain she has to entire aspect of both knees.  Pain is worse going from a seated to standing position as well as with stair climbing.  She has been taking Tylenol without relief.  Unable to take NSAIDs as she is on warfarin.  She has not previously undergone cortisone or viscosupplementation injection either knee. ? ?Review of Systems as detailed in HPI.  All others reviewed  and are negative. ? ? ?Objective: ?Vital Signs: Ht '4\' 10"'$  (1.473 m)   Wt 198 lb (89.8 kg)   BMI 41.38 kg/m?  ? ?Physical Exam well-developed well-nourished female no acute distress.  Alert and oriented x3. ? ?Ortho Exam bilateral knee exam shows trace effusions.  Range of motion 0 to 100 degrees.  Medial joint line tenderness.  Moderate patellofemoral crepitus.  She is neurovascular intact distally. ? ?Specialty Comments:  ?No specialty comments available. ? ?Imaging: ?XR KNEE 3 VIEW LEFT ? ?Result Date: 01/24/2022 ?Advanced degenerative changes medial and patellofemoral compartments with chondrocalcinosis lateral compartment ? ?XR KNEE 3 VIEW RIGHT ? ?Result Date: 01/24/2022 ?Advanced degenerative changes medial and patellofemoral compartments with chondrocalcinosis lateral compartment  ? ? ?PMFS History: ?Patient Active Problem List  ? Diagnosis Date Noted  ? IDA (iron deficiency anemia) 01/13/2022  ? Atrial fibrillation ([place]) 01/05/2022  ? Breast cancer ([place]) 01/05/2022  ? Primary hyperparathyroidism ([place]) 01/05/2022  ? Essential hypertension 01/05/2022  ? ?Past Medical History:  ?Diagnosis Date  ? Anemia   ? Atrial fibrillation ([place])   ? on Coumadin  ? Breast mass, right   ? Cancer [facility])   ? LEFT BREAST-Mastectomy  ? GERD (gastroesophageal reflux disease)   ? Hypertension   ? OAB (overactive bladder)   ? PONV (postoperative nausea and vomiting)   ?  ?  No family history on file.  ?Past Surgical History:  ?Procedure Laterality Date  ? BREAST LUMPECTOMY WITH RADIOACTIVE SEED LOCALIZATION Right 10/25/2021  ? Procedure: RIGHT BREAST LUMPECTOMY WITH RADIOACTIVE SEED LOCALIZATION;  Surgeon: Erroll Luna, MD;  Location: [facility];  Service: General;  Laterality: Right;  ? CHOLECYSTECTOMY    ? MASTECTOMY Left 2010  ? OVARIAN CYST REMOVAL    ? PARATHYROIDECTOMY    ? ?Social History  ? ?Occupational History  ? Not on file  ?Tobacco Use  ? Smoking status: Never  ? Smokeless tobacco: Never  ?Substance and  Sexual Activity  ? Alcohol use: Never  ? Drug use: Never  ? Sexual activity: Not Currently  ?  Birth control/protection: Post-menopausal  ? ? ? ? ? ? ?

## 2022-01-27 ENCOUNTER — Telehealth: Payer: Self-pay

## 2022-01-27 ENCOUNTER — Encounter: Payer: Self-pay | Admitting: Family Medicine

## 2022-01-27 ENCOUNTER — Ambulatory Visit (INDEPENDENT_AMBULATORY_CARE_PROVIDER_SITE_OTHER): Payer: Medicare HMO

## 2022-01-27 VITALS — Ht <= 58 in | Wt 198.0 lb

## 2022-01-27 DIAGNOSIS — R600 Localized edema: Secondary | ICD-10-CM

## 2022-01-27 DIAGNOSIS — Z0000 Encounter for general adult medical examination without abnormal findings: Secondary | ICD-10-CM | POA: Diagnosis not present

## 2022-01-27 MED ORDER — FUROSEMIDE 20 MG PO TABS
ORAL_TABLET | ORAL | 1 refills | Status: DC
Start: 2022-01-27 — End: 2022-10-23

## 2022-01-27 NOTE — Telephone Encounter (Signed)
During AWV pt states that she has noticed increased swelling in her feet since the temperature has gotten warmer. Pt asks if she can get an rx for Lasix to help? I advised the patient that I would need to ask the provider. Thank you. ?

## 2022-01-27 NOTE — Patient Instructions (Signed)
Loretta Gates , ?Thank you for taking time to come for your Medicare Wellness Visit. I appreciate your ongoing commitment to your health goals. Please review the following plan we discussed and let me know if I can assist you in the future.  ? ?Screening recommendations/referrals: ?Colonoscopy: No longer required due to age. ?Mammogram: No longer required.  ?Bone Density: Schedule with Dr. Posey Pronto  ? ?Recommended yearly ophthalmology/optometry visit for glaucoma screening and checkup ?Recommended yearly dental visit for hygiene and checkup ? ?Vaccinations: ?Influenza vaccine: Done 07/28/2021 Repeat annually ? ?Pneumococcal vaccine: Done 09/30/2007, 09/23/2014 ?Tdap vaccine: Done 01/11/2022 Repeat annually ? ?Shingles vaccine: Done 08/02/2021, 03/30/2021, 07/07/2020, 11/14/2019, 10/24/2019   ?Covid-19:Done 08/02/2021, 03/30/2021, 07/07/2020, 11/14/2019, 10/24/2019 ? ?Advanced directives: Please bring a copy of your health care power of attorney and living will to the office to be added to your chart at your convenience. ? ? ?Conditions/risks identified: Aim for 30 minutes of exercise or brisk walking, 6-8 glasses of water, and 5 servings of fruits and vegetables each day. ? ? ?Next appointment: Follow up in one year for your annual wellness visit 2024 ? ? ? ?Preventive Care 82 Years and Older, Female ?Preventive care refers to lifestyle choices and visits with your health care provider that can promote health and wellness. ?What does preventive care include? ?A yearly physical exam. This is also called an annual well check. ?Dental exams once or twice a year. ?Routine eye exams. Ask your health care provider how often you should have your eyes checked. ?Personal lifestyle choices, including: ?Daily care of your teeth and gums. ?Regular physical activity. ?Eating a healthy diet. ?Avoiding tobacco and drug use. ?Limiting alcohol use. ?Practicing safe sex. ?Taking low-dose aspirin every day. ?Taking vitamin and mineral supplements as  recommended by your health care provider. ?What happens during an annual well check? ?The services and screenings done by your health care provider during your annual well check will depend on your age, overall health, lifestyle risk factors, and family history of disease. ?Counseling  ?Your health care provider may ask you questions about your: ?Alcohol use. ?Tobacco use. ?Drug use. ?Emotional well-being. ?Home and relationship well-being. ?Sexual activity. ?Eating habits. ?History of falls. ?Memory and ability to understand (cognition). ?Work and work Statistician. ?Reproductive health. ?Screening  ?You may have the following tests or measurements: ?Height, weight, and BMI. ?Blood pressure. ?Lipid and cholesterol levels. These may be checked every 5 years, or more frequently if you are over 7 years old. ?Skin check. ?Lung cancer screening. You may have this screening every year starting at age 6 if you have a 30-pack-year history of smoking and currently smoke or have quit within the past 15 years. ?Fecal occult blood test (FOBT) of the stool. You may have this test every year starting at age 81. ?Flexible sigmoidoscopy or colonoscopy. You may have a sigmoidoscopy every 5 years or a colonoscopy every 10 years starting at age 75. ?Hepatitis C blood test. ?Hepatitis B blood test. ?Sexually transmitted disease (STD) testing. ?Diabetes screening. This is done by checking your blood sugar (glucose) after you have not eaten for a while (fasting). You may have this done every 1-3 years. ?Bone density scan. This is done to screen for osteoporosis. You may have this done starting at age 9. ?Mammogram. This may be done every 1-2 years. Talk to your health care provider about how often you should have regular mammograms. ?Talk with your health care provider about your test results, treatment options, and if necessary, the need for  more tests. ?Vaccines  ?Your health care provider may recommend certain vaccines, such  as: ?Influenza vaccine. This is recommended every year. ?Tetanus, diphtheria, and acellular pertussis (Tdap, Td) vaccine. You may need a Td booster every 10 years. ?Zoster vaccine. You may need this after age 74. ?Pneumococcal 13-valent conjugate (PCV13) vaccine. One dose is recommended after age 62. ?Pneumococcal polysaccharide (PPSV23) vaccine. One dose is recommended after age 97. ?Talk to your health care provider about which screenings and vaccines you need and how often you need them. ?This information is not intended to replace advice given to you by your health care provider. Make sure you discuss any questions you have with your health care provider. ?Document Released: 10/22/2015 Document Revised: 06/14/2016 Document Reviewed: 07/27/2015 ?Elsevier Interactive Patient Education ? 2017 [place]. ? ?Fall Prevention in the Home ?Falls can cause injuries. They can happen to people of all ages. There are many things you can do to make your home safe and to help prevent falls. ?What can I do on the outside of my home? ?Regularly fix the edges of walkways and driveways and fix any cracks. ?Remove anything that might make you trip as you walk through a door, such as a raised step or threshold. ?Trim any bushes or trees on the path to your home. ?Use bright outdoor lighting. ?Clear any walking paths of anything that might make someone trip, such as rocks or tools. ?Regularly check to see if handrails are loose or broken. Make sure that both sides of any steps have handrails. ?Any raised decks and porches should have guardrails on the edges. ?Have any leaves, snow, or ice cleared regularly. ?Use sand or salt on walking paths during winter. ?Clean up any spills in your garage right away. This includes oil or grease spills. ?What can I do in the bathroom? ?Use night lights. ?Install grab bars by the toilet and in the tub and shower. Do not use towel bars as grab bars. ?Use non-skid mats or decals in the tub or  shower. ?If you need to sit down in the shower, use a plastic, non-slip stool. ?Keep the floor dry. Clean up any water that spills on the floor as soon as it happens. ?Remove soap buildup in the tub or shower regularly. ?Attach bath mats securely with double-sided non-slip rug tape. ?Do not have throw rugs and other things on the floor that can make you trip. ?What can I do in the bedroom? ?Use night lights. ?Make sure that you have a light by your bed that is easy to reach. ?Do not use any sheets or blankets that are too big for your bed. They should not hang down onto the floor. ?Have a firm chair that has side arms. You can use this for support while you get dressed. ?Do not have throw rugs and other things on the floor that can make you trip. ?What can I do in the kitchen? ?Clean up any spills right away. ?Avoid walking on wet floors. ?Keep items that you use a lot in easy-to-reach places. ?If you need to reach something above you, use a strong step stool that has a grab bar. ?Keep electrical cords out of the way. ?Do not use floor polish or wax that makes floors slippery. If you must use wax, use non-skid floor wax. ?Do not have throw rugs and other things on the floor that can make you trip. ?What can I do with my stairs? ?Do not leave any items on the stairs. ?Make  sure that there are handrails on both sides of the stairs and use them. Fix handrails that are broken or loose. Make sure that handrails are as long as the stairways. ?Check any carpeting to make sure that it is firmly attached to the stairs. Fix any carpet that is loose or worn. ?Avoid having throw rugs at the top or bottom of the stairs. If you do have throw rugs, attach them to the floor with carpet tape. ?Make sure that you have a light switch at the top of the stairs and the bottom of the stairs. If you do not have them, ask someone to add them for you. ?What else can I do to help prevent falls? ?Wear shoes that: ?Do not have high heels. ?Have  rubber bottoms. ?Are comfortable and fit you well. ?Are closed at the toe. Do not wear sandals. ?If you use a stepladder: ?Make sure that it is fully opened. Do not climb a closed stepladder. ?Make sure that bot

## 2022-01-27 NOTE — Addendum Note (Signed)
Addended by: Darreld Mclean on: 01/27/2022 06:52 PM ? ? Modules accepted: Orders ? ?

## 2022-01-27 NOTE — Progress Notes (Signed)
? ?Subjective:  ? Loretta Gates is a 86 y.o. female who presents for an Initial Medicare Annual Wellness Visit. ?Virtual Visit via Telephone Note ? ?I connected with  Loretta Gates on 01/27/22 at 10:30 AM EDT by telephone and verified that I am speaking with the correct person using two identifiers. ? ?Location: ?Patient: HOME ?Provider: [facility] ?Persons participating in the virtual visit: patient/Nurse Health Advisor ?  ?I discussed the limitations, risks, security and privacy concerns of performing an evaluation and management service by telephone and the availability of in person appointments. The patient expressed understanding and agreed to proceed. ? ?Interactive audio and video telecommunications were attempted between this nurse and patient, however failed, due to patient having technical difficulties OR patient did not have access to video capability.  We continued and completed visit with audio only. ? ?Some vital signs may be absent or patient reported.  ? ?Chriss Driver, LPN ? ?Review of Systems    ? ?Cardiac Risk Factors include: advanced age (>70mn, >>65women);hypertension;sedentary lifestyle;obesity (BMI >30kg/m2) ? ?   ?Objective:  ?  ?Today's Vitals  ? 01/27/22 1052  ?Weight: 198 lb (89.8 kg)  ?Height: '4\' 10"'$  (1.473 m)  ? ?Body mass index is 41.38 kg/m?. ? ? ?  01/27/2022  ? 11:04 AM 01/13/2022  ? 10:52 AM 10/25/2021  ?  8:02 AM 10/19/2021  ?  4:54 PM  ?Advanced Directives  ?Does Patient Have a Medical Advance Directive? Yes Yes  Yes  ?Type of Advance Directive Healthcare Power of Attorney Living will;Healthcare Power of Attorney Living will Healthcare Power of Attorney  ?Does patient want to make changes to medical advance directive?  No - Patient declined No - Patient declined No - Patient declined  ?[place] of [place]? No - copy requested No - copy requested No - copy requested   ? ? ?Current Medications (verified) ?Outpatient Encounter Medications as of 01/27/2022   ?Medication Sig  ? amLODipine (NORVASC) 5 MG tablet Take 5 mg by mouth at bedtime.  ? cholestyramine (QUESTRAN) 4 g packet Take 4 g by mouth 3 (three) times daily with meals.  ? denosumab (PROLIA) 60 MG/ML SOSY injection Inject 60 mg into the skin every 6 (six) months.  ? ergocalciferol (VITAMIN D2) 1.25 MG (50000 UT) capsule Take 50,000 Units by mouth every 14 (fourteen) days.  ? HYDROcodone-acetaminophen (NORCO/VICODIN) 5-325 MG tablet Take 1 tablet by mouth every 6 (six) hours as needed for moderate pain.  ? irbesartan (AVAPRO) 300 MG tablet Take 300 mg by mouth daily.  ? metoprolol succinate (TOPROL-XL) 25 MG 24 hr tablet Take 12.5 mg by mouth in the morning and at bedtime.  ? omeprazole (PRILOSEC) 40 MG capsule Take 40 mg by mouth in the morning and at bedtime.  ? oxybutynin (DITROPAN XL) 15 MG 24 hr tablet Take 15 mg by mouth at bedtime.  ? warfarin (COUMADIN) 5 MG tablet Take 5 mg by mouth daily. TAKE 1/2 TAB ON MON-WED-FRI  ? [DISCONTINUED] cholestyramine (QUESTRAN) 4 GM/DOSE powder SMARTSIG:1 Teaspoon By Mouth Twice Daily  ? ?No facility-administered encounter medications on file as of 01/27/2022.  ? ? ?Allergies (verified) ?Bee venom and Levaquin [levofloxacin]  ? ?History: ?Past Medical History:  ?Diagnosis Date  ? Anemia   ? Atrial fibrillation ([place]   ? on Coumadin  ? Breast mass, right   ? Cancer ([facility]   ? LEFT BREAST-Mastectomy  ? GERD (gastroesophageal reflux disease)   ? Hypertension   ?  OAB (overactive bladder)   ? PONV (postoperative nausea and vomiting)   ? ?Past Surgical History:  ?Procedure Laterality Date  ? BREAST LUMPECTOMY WITH RADIOACTIVE SEED LOCALIZATION Right 10/25/2021  ? Procedure: RIGHT BREAST LUMPECTOMY WITH RADIOACTIVE SEED LOCALIZATION;  Surgeon: Erroll Luna, MD;  Location: [facility];  Service: General;  Laterality: Right;  ? CHOLECYSTECTOMY    ? MASTECTOMY Left 2010  ? OVARIAN CYST REMOVAL    ? PARATHYROIDECTOMY    ? ?No family history on file. ?Social History   ? ?Socioeconomic History  ? Marital status: Married  ?  Spouse name: Not on file  ? Number of children: Not on file  ? Years of education: Not on file  ? Highest education level: Not on file  ?Occupational History  ? Not on file  ?Tobacco Use  ? Smoking status: Never  ? Smokeless tobacco: Never  ?Substance and Sexual Activity  ? Alcohol use: Never  ? Drug use: Never  ? Sexual activity: Not Currently  ?  Birth control/protection: Post-menopausal  ?Other Topics Concern  ? Not on file  ?Social History Narrative  ? Not on file  ? ?Social Determinants of Health  ? ?Financial Resource Strain: Low Risk   ? Difficulty of Paying Living Expenses: Not hard at all  ?Food Insecurity: No Food Insecurity  ? Worried About Charity fundraiser in the Last Year: Never true  ? Ran Out of Food in the Last Year: Never true  ?Transportation Needs: No Transportation Needs  ? Lack of Transportation (Medical): No  ? Lack of Transportation (Non-Medical): No  ?Physical Activity: Insufficiently Active  ? Days of Exercise per Week: 3 days  ? Minutes of Exercise per Session: 30 min  ?Stress: No Stress Concern Present  ? Feeling of Stress : Not at all  ?Social Connections: Socially Integrated  ? Frequency of Communication with Friends and Family: More than three times a week  ? Frequency of Social Gatherings with Friends and Family: More than three times a week  ? Attends Religious Services: More than 4 times per year  ? Active Member of Clubs or Organizations: Yes  ? Attends Archivist Meetings: More than 4 times per year  ? Marital Status: Married  ? ? ?Tobacco Counseling ?Counseling given: Not Answered ? ? ?Clinical Intake: ? ?Pre-visit preparation completed: Yes ? ?Pain : No/denies pain ? ?  ? ?BMI - recorded: 41.38 ?Nutritional Status: BMI > 30  Obese ?Nutritional Risks: None ?Diabetes: No ? ?How often do you need to have someone help you when you read instructions, pamphlets, or other written materials from your doctor or  pharmacy?: 1 - Never ? ?Diabetic?no ? ? ?Interpreter Needed?: No ? ?Information entered by :: mj Takiera Mayo, lpn ? ? ?Activities of Daily Living ? ?  01/27/2022  ? 11:06 AM 10/25/2021  ?  8:00 AM  ?In your present state of health, do you have any difficulty performing the following activities:  ?Hearing? 0 0  ?Vision? 0 0  ?Difficulty concentrating or making decisions? 0 0  ?Walking or climbing stairs? 0 0  ?Dressing or bathing? 0 0  ?Doing errands, shopping? 0   ?Preparing Food and eating ? N   ?Using the Toilet? N   ?In the past six months, have you accidently leaked urine? Y   ?Comment at night time.   ?Do you have problems with loss of bowel control? N   ?Managing your Medications? N   ?Managing your Finances? N   ?  Housekeeping or managing your Housekeeping? N   ? ? ?Patient Care Team: ?Copland, Gay Filler, MD as PCP - General (Family Medicine) ? ?Indicate any recent Medical Services you may have received from other than [facility] providers in the past year (date may be approximate). ? ?   ?Assessment:  ? This is a routine wellness examination for [place]. ? ?Hearing/Vision screen ?Hearing Screening - Comments:: No hearing issues.  ?Vision Screening - Comments:: Glasses. [employer] 2022 ? ?Dietary issues and exercise activities discussed: ?Current Exercise Habits: Home exercise routine, Type of exercise: walking, Time (Minutes): 30, Frequency (Times/Week): 3, Weekly Exercise (Minutes/Week): 90, Intensity: Mild, Exercise limited by: cardiac condition(s);orthopedic condition(s) ? ? Goals Addressed   ? ?  ?  ?  ?  ? This Visit's Progress  ?  Exercise 3x per week (30 min per time)     ?  Pt states she is trying to exercise more.  ?  ? ?  ? ?Depression Screen ? ?  01/27/2022  ? 10:57 AM 11/14/2021  ? 11:14 AM  ?PHQ 2/9 Scores  ?PHQ - 2 Score 0 0  ?Exception Documentation  Medical reason  ?  ?Fall Risk ? ?  01/27/2022  ? 11:04 AM  ?Fall Risk   ?Falls in the past year? 0  ?Number falls in past yr: 0  ?Injury with Fall? 0  ?Risk  for fall due to : No Fall Risks  ?Follow up Falls prevention discussed  ? ? ?FALL RISK PREVENTION PERTAINING TO THE HOME: ? ?Any stairs in or around the home? Yes  ?If so, are there any without handrails? No

## 2022-01-30 ENCOUNTER — Inpatient Hospital Stay: Payer: Medicare HMO

## 2022-01-30 VITALS — BP 150/53 | HR 55 | Temp 98.3°F | Resp 18

## 2022-01-30 DIAGNOSIS — D509 Iron deficiency anemia, unspecified: Secondary | ICD-10-CM

## 2022-01-30 MED ORDER — SODIUM CHLORIDE 0.9 % IV SOLN: Freq: Once | INTRAVENOUS | Status: AC

## 2022-01-30 MED ORDER — SODIUM CHLORIDE 0.9 % IV SOLN
125.0000 mg | Freq: Once | INTRAVENOUS | Status: AC
  Administered 2022-01-30: 125 mg via INTRAVENOUS
  Filled 2022-01-30: qty 125

## 2022-01-30 NOTE — Patient Instructions (Signed)
Sodium Ferric Gluconate Complex Injection What is this medication? SODIUM FERRIC GLUCONATE COMPLEX (SOE dee um FER ik GLOO koe nate KOM pleks) treats low levels of iron (iron deficiency anemia) in people with kidney disease. Iron is a mineral that plays an important role in making red blood cells, which carry oxygen from your lungs to the rest of your body. This medicine may be used for other purposes; ask your health care provider or pharmacist if you have questions. COMMON BRAND NAME(S): Ferrlecit, Nulecit What should I tell my care team before I take this medication? They need to know if you have any of the following conditions: Anemia that is not from iron deficiency High levels of iron in the blood An unusual or allergic reaction to iron, other medications, foods, dyes, or preservatives Pregnant or are trying to become pregnant Breast-feeding How should I use this medication? This medication is injected into a vein. It is given by your care team in a hospital or clinic setting. Talk to your care team about the use of this medication in children. While it may be prescribed for children as young as 6 years for selected conditions, precautions do apply. Overdosage: If you think you have taken too much of this medicine contact a poison control center or emergency room at once. NOTE: This medicine is only for you. Do not share this medicine with others. What if I miss a dose? It is important not to miss your dose. Call your care team if you are unable to keep an appointment. What may interact with this medication? Do not take this medication with any of the following: Deferasirox Deferoxamine Dimercaprol This medication may also interact with the following: Other iron products This list may not describe all possible interactions. Give your health care provider a list of all the medicines, herbs, non-prescription drugs, or dietary supplements you use. Also tell them if you smoke, drink  alcohol, or use illegal drugs. Some items may interact with your medicine. What should I watch for while using this medication? Your condition will be monitored carefully while you are receiving this medication. Visit your care team for regular checks on your progress. You may need blood work while you are taking this medication. What side effects may I notice from receiving this medication? Side effects that you should report to your care team as soon as possible: Allergic reactions--skin rash, itching, hives, swelling of the face, lips, tongue, or throat Low blood pressure--dizziness, feeling faint or lightheaded, blurry vision Shortness of breath Side effects that usually do not require medical attention (report to your care team if they continue or are bothersome): Flushing Headache Joint pain Muscle pain Nausea Pain, redness, or irritation at injection site This list may not describe all possible side effects. Call your doctor for medical advice about side effects. You may report side effects to FDA at 1-800-FDA-1088. Where should I keep my medication? This medication is given in a hospital or clinic and will not be stored at home. NOTE: This sheet is a summary. It may not cover all possible information. If you have questions about this medicine, talk to your doctor, pharmacist, or health care provider.  2023 Elsevier/Gold Standard (2021-02-18 00:00:00)  

## 2022-01-31 DIAGNOSIS — Z7901 Long term (current) use of anticoagulants: Secondary | ICD-10-CM | POA: Diagnosis not present

## 2022-01-31 DIAGNOSIS — Z5181 Encounter for therapeutic drug level monitoring: Secondary | ICD-10-CM | POA: Diagnosis not present

## 2022-01-31 DIAGNOSIS — I480 Paroxysmal atrial fibrillation: Secondary | ICD-10-CM | POA: Diagnosis not present

## 2022-02-15 DIAGNOSIS — M810 Age-related osteoporosis without current pathological fracture: Secondary | ICD-10-CM | POA: Diagnosis not present

## 2022-03-10 ENCOUNTER — Encounter: Payer: Self-pay | Admitting: Family

## 2022-03-10 ENCOUNTER — Inpatient Hospital Stay: Payer: Medicare HMO | Admitting: Family

## 2022-03-10 ENCOUNTER — Other Ambulatory Visit: Payer: Self-pay

## 2022-03-10 ENCOUNTER — Inpatient Hospital Stay: Payer: Medicare HMO | Attending: Hematology & Oncology

## 2022-03-10 VITALS — BP 144/88 | HR 60 | Temp 98.0°F | Resp 18 | Ht <= 58 in | Wt 201.0 lb

## 2022-03-10 DIAGNOSIS — Z79899 Other long term (current) drug therapy: Secondary | ICD-10-CM | POA: Insufficient documentation

## 2022-03-10 DIAGNOSIS — D509 Iron deficiency anemia, unspecified: Secondary | ICD-10-CM | POA: Diagnosis not present

## 2022-03-10 DIAGNOSIS — Z91030 Bee allergy status: Secondary | ICD-10-CM | POA: Diagnosis not present

## 2022-03-10 DIAGNOSIS — Z881 Allergy status to other antibiotic agents status: Secondary | ICD-10-CM | POA: Insufficient documentation

## 2022-03-10 DIAGNOSIS — R5383 Other fatigue: Secondary | ICD-10-CM | POA: Insufficient documentation

## 2022-03-10 DIAGNOSIS — D649 Anemia, unspecified: Secondary | ICD-10-CM

## 2022-03-10 LAB — RETICULOCYTES
Immature Retic Fract: 24.9 % — ABNORMAL HIGH (ref 2.3–15.9)
RBC.: 5.04 MIL/uL (ref 3.87–5.11)
Retic Count, Absolute: 84.7 10*3/uL (ref 19.0–186.0)
Retic Ct Pct: 1.7 % (ref 0.4–3.1)

## 2022-03-10 LAB — CMP (CANCER CENTER ONLY)
ALT: 26 U/L (ref 0–44)
AST: 20 U/L (ref 15–41)
Albumin: 4.5 g/dL (ref 3.5–5.0)
Alkaline Phosphatase: 65 U/L (ref 38–126)
Anion gap: 6 (ref 5–15)
BUN: 20 mg/dL (ref 8–23)
CO2: 28 mmol/L (ref 22–32)
Calcium: 11.3 mg/dL — ABNORMAL HIGH (ref 8.9–10.3)
Chloride: 105 mmol/L (ref 98–111)
Creatinine: 0.68 mg/dL (ref 0.44–1.00)
GFR, Estimated: >60 mL/min (ref >60)
Glucose, Bld: 110 mg/dL — ABNORMAL HIGH (ref 70–99)
Potassium: 4.4 mmol/L (ref 3.5–5.1)
Sodium: 139 mmol/L (ref 135–145)
Total Bilirubin: 0.5 mg/dL (ref 0.3–1.2)
Total Protein: 7.2 g/dL (ref 6.5–8.1)

## 2022-03-10 LAB — IRON AND IRON BINDING CAPACITY (CC-WL,HP ONLY)
Iron: 43 ug/dL (ref 28–170)
Saturation Ratios: 9 % — ABNORMAL LOW (ref 10.4–31.8)
TIBC: 481 ug/dL — ABNORMAL HIGH (ref 250–450)
UIBC: 438 ug/dL

## 2022-03-10 LAB — CBC WITH DIFFERENTIAL (CANCER CENTER ONLY)
Abs Immature Granulocytes: 0.05 10*3/uL (ref 0.00–0.07)
Basophils Absolute: 0.1 10*3/uL (ref 0.0–0.1)
Basophils Relative: 1 %
Eosinophils Absolute: 0.2 10*3/uL (ref 0.0–0.5)
Eosinophils Relative: 3 %
HCT: 40.1 % (ref 36.0–46.0)
Hemoglobin: 12.5 g/dL (ref 12.0–15.0)
Immature Granulocytes: 1 %
Lymphocytes Relative: 23 %
Lymphs Abs: 2 10*3/uL (ref 0.7–4.0)
MCH: 24.9 pg — ABNORMAL LOW (ref 26.0–34.0)
MCHC: 31.2 g/dL (ref 30.0–36.0)
MCV: 79.9 fL — ABNORMAL LOW (ref 80.0–100.0)
Monocytes Absolute: 0.6 10*3/uL (ref 0.1–1.0)
Monocytes Relative: 7 %
Neutro Abs: 5.6 10*3/uL (ref 1.7–7.7)
Neutrophils Relative %: 65 %
Platelet Count: 299 10*3/uL (ref 150–400)
RBC: 5.02 MIL/uL (ref 3.87–5.11)
RDW: 19.3 % — ABNORMAL HIGH (ref 11.5–15.5)
WBC Count: 8.6 10*3/uL (ref 4.0–10.5)
nRBC: 0 % (ref 0.0–0.2)

## 2022-03-10 LAB — FERRITIN: Ferritin: 8 ng/mL — ABNORMAL LOW (ref 11–307)

## 2022-03-10 NOTE — Progress Notes (Signed)
Hematology and Oncology Follow Up Visit  Loretta Gates [id number] 1935/11/12 86 y.o. 03/10/2022   Principle Diagnosis:  Iron deficiency anemia   Current Therapy:   IV iron as indicated    Interim History:  [name] is here today for follow-up. She is doing fairly well but still notes fatigue.  No blood loss noted. No bruising or petechiae.  Hgb is improved at 12.5. Iron studies are pending.  No fever, chills, n/v, cough, rash, dizziness, SOB, chest pain, palpitations, abdominal pain or changes in bowel or bladder habits.  No swelling, tenderness, numbness or tingling in her extremities.  No falls or syncope.  Her appetite and hydration are good. Weight is 201 lbs.  She enjoys working in her yard and being outdoors.   ECOG Performance Status: 1 - Symptomatic but completely ambulatory  Medications:  Allergies as of 03/10/2022       Reactions   Bee Venom Swelling   Bee stung her on the inside of her gum. Lips and gums started swelling. No SOB.   Levaquin [levofloxacin] Hives, Itching        Medication List        Accurate as of March 10, 2022 10:37 AM. If you have any questions, ask your nurse or doctor.          amLODipine 5 MG tablet Commonly known as: NORVASC Take 5 mg by mouth at bedtime.   cholestyramine 4 g packet Commonly known as: QUESTRAN Take 4 g by mouth 3 (three) times daily with meals.   denosumab 60 MG/ML Sosy injection Commonly known as: PROLIA Inject 60 mg into the skin every 6 (six) months.   ergocalciferol 1.25 MG (50000 UT) capsule Commonly known as: VITAMIN D2 Take 50,000 Units by mouth every 14 (fourteen) days.   furosemide 20 MG tablet Commonly known as: LASIX Take one by mouth daily as needed for swelling   HYDROcodone-acetaminophen 5-325 MG tablet Commonly known as: NORCO/VICODIN Take 1 tablet by mouth every 6 (six) hours as needed for moderate pain.   irbesartan 300 MG tablet Commonly known as: AVAPRO Take 300 mg by mouth  daily.   metoprolol succinate 25 MG 24 hr tablet Commonly known as: TOPROL-XL Take 12.5 mg by mouth in the morning and at bedtime.   omeprazole 40 MG capsule Commonly known as: PRILOSEC Take 40 mg by mouth in the morning and at bedtime.   oxybutynin 15 MG 24 hr tablet Commonly known as: DITROPAN XL Take 15 mg by mouth at bedtime.   warfarin 5 MG tablet Commonly known as: COUMADIN Take 5 mg by mouth daily. TAKE 1/2 TAB ON MON-WED-FRI        Allergies:  Allergies  Allergen Reactions   Bee Venom Swelling    Bee stung her on the inside of her gum. Lips and gums started swelling. No SOB.   Levaquin [Levofloxacin] Hives and Itching    Past Medical History, Surgical history, Social history, and Family History were reviewed and updated.  Review of Systems: All other 10 point review of systems is negative.   Physical Exam:  vitals were not taken for this visit.   Wt Readings from Last 3 Encounters:  01/27/22 198 lb (89.8 kg)  01/24/22 198 lb (89.8 kg)  01/13/22 198 lb (89.8 kg)    Ocular: Sclerae unicteric, pupils equal, round and reactive to light Ear-nose-throat: Oropharynx clear, dentition fair Lymphatic: No cervical or supraclavicular adenopathy Lungs no rales or rhonchi, good excursion bilaterally Heart regular rate  and rhythm, no murmur appreciated Abd soft, nontender, positive bowel sounds MSK no focal spinal tenderness, no joint edema Neuro: non-focal, well-oriented, appropriate affect Breasts: Deferred   Lab Results  Component Value Date   WBC 8.6 03/10/2022   HGB 12.5 03/10/2022   HCT 40.1 03/10/2022   MCV 79.9 (L) 03/10/2022   PLT 299 03/10/2022   Lab Results  Component Value Date   FERRITIN 8 (L) 01/13/2022   IRON 54 01/13/2022   TIBC 503 (H) 01/13/2022   UIBC 449 (H) 01/13/2022   IRONPCTSAT 11 01/13/2022   Lab Results  Component Value Date   RETICCTPCT 1.7 03/10/2022   RBC 5.04 03/10/2022   No results found for: KPAFRELGTCHN, LAMBDASER,  KAPLAMBRATIO No results found for: IGGSERUM, IGA, IGMSERUM No results found for: Odetta Pink, SPEI   Chemistry      Component Value Date/Time   NA 140 01/13/2022 1015   K 3.9 01/13/2022 1015   CL 105 01/13/2022 1015   CO2 27 01/13/2022 1015   BUN 18 01/13/2022 1015   CREATININE 0.71 01/13/2022 1015      Component Value Date/Time   CALCIUM 10.8 (H) 01/13/2022 1015   ALKPHOS 47 01/13/2022 1015   AST 19 01/13/2022 1015   ALT 24 01/13/2022 1015   BILITOT 0.7 01/13/2022 1015       Impression and Plan: [name] is a very pleasant 77 to caucasian female with recent diagnosis of iron deficiency anemia.  Iron studies are pending.  Follow-up in 3 months.   [name], NP 6/2/202310:37 AM

## 2022-03-14 ENCOUNTER — Telehealth: Payer: Self-pay | Admitting: *Deleted

## 2022-03-14 DIAGNOSIS — Z7901 Long term (current) use of anticoagulants: Secondary | ICD-10-CM | POA: Diagnosis not present

## 2022-03-14 DIAGNOSIS — R791 Abnormal coagulation profile: Secondary | ICD-10-CM | POA: Diagnosis not present

## 2022-03-14 DIAGNOSIS — I480 Paroxysmal atrial fibrillation: Secondary | ICD-10-CM | POA: Diagnosis not present

## 2022-03-14 DIAGNOSIS — Z5181 Encounter for therapeutic drug level monitoring: Secondary | ICD-10-CM | POA: Diagnosis not present

## 2022-03-14 NOTE — Telephone Encounter (Signed)
Per 03/10/22 los - gave upcoming appointments - confirmed

## 2022-03-16 ENCOUNTER — Telehealth: Payer: Self-pay | Admitting: Family

## 2022-03-16 NOTE — Telephone Encounter (Signed)
Called to schedule 2 doses of iron per 6/2 sch message , left voicemail for patient to call us back to schedule

## 2022-03-20 ENCOUNTER — Inpatient Hospital Stay: Payer: Medicare HMO

## 2022-03-20 VITALS — BP 157/60 | HR 70 | Temp 98.4°F | Resp 18

## 2022-03-20 DIAGNOSIS — R5383 Other fatigue: Secondary | ICD-10-CM | POA: Diagnosis not present

## 2022-03-20 DIAGNOSIS — Z91030 Bee allergy status: Secondary | ICD-10-CM | POA: Diagnosis not present

## 2022-03-20 DIAGNOSIS — Z79899 Other long term (current) drug therapy: Secondary | ICD-10-CM | POA: Diagnosis not present

## 2022-03-20 DIAGNOSIS — D509 Iron deficiency anemia, unspecified: Secondary | ICD-10-CM

## 2022-03-20 DIAGNOSIS — Z881 Allergy status to other antibiotic agents status: Secondary | ICD-10-CM | POA: Diagnosis not present

## 2022-03-20 MED ORDER — SODIUM CHLORIDE 0.9 % IV SOLN
125.0000 mg | Freq: Once | INTRAVENOUS | Status: AC
  Administered 2022-03-20: 125 mg via INTRAVENOUS
  Filled 2022-03-20: qty 125

## 2022-03-20 MED ORDER — SODIUM CHLORIDE 0.9 % IV SOLN: Freq: Once | INTRAVENOUS | Status: AC

## 2022-03-20 MED ORDER — SODIUM CHLORIDE 0.9% FLUSH: 3.0000 mL | Freq: Once | INTRAVENOUS | Status: DC | PRN

## 2022-03-20 MED ORDER — SODIUM CHLORIDE 0.9% FLUSH: 10.0000 mL | Freq: Once | INTRAVENOUS | Status: DC | PRN

## 2022-03-20 NOTE — Patient Instructions (Signed)

## 2022-03-27 ENCOUNTER — Inpatient Hospital Stay: Payer: Medicare HMO

## 2022-03-27 VITALS — BP 145/74 | HR 64 | Temp 98.2°F | Resp 19

## 2022-03-27 DIAGNOSIS — Z881 Allergy status to other antibiotic agents status: Secondary | ICD-10-CM | POA: Diagnosis not present

## 2022-03-27 DIAGNOSIS — R5383 Other fatigue: Secondary | ICD-10-CM | POA: Diagnosis not present

## 2022-03-27 DIAGNOSIS — Z91030 Bee allergy status: Secondary | ICD-10-CM | POA: Diagnosis not present

## 2022-03-27 DIAGNOSIS — Z79899 Other long term (current) drug therapy: Secondary | ICD-10-CM | POA: Diagnosis not present

## 2022-03-27 DIAGNOSIS — D509 Iron deficiency anemia, unspecified: Secondary | ICD-10-CM | POA: Diagnosis not present

## 2022-03-27 MED ORDER — SODIUM CHLORIDE 0.9 % IV SOLN
125.0000 mg | Freq: Once | INTRAVENOUS | Status: AC
  Administered 2022-03-27: 125 mg via INTRAVENOUS
  Filled 2022-03-27: qty 125

## 2022-03-27 MED ORDER — SODIUM CHLORIDE 0.9 % IV SOLN: Freq: Once | INTRAVENOUS | Status: AC

## 2022-03-27 NOTE — Patient Instructions (Signed)
Sodium Ferric Gluconate Complex Injection What is this medication? SODIUM FERRIC GLUCONATE COMPLEX (SOE dee um FER ik GLOO koe nate KOM pleks) treats low levels of iron (iron deficiency anemia) in people with kidney disease. Iron is a mineral that plays an important role in making red blood cells, which carry oxygen from your lungs to the rest of your body. This medicine may be used for other purposes; ask your health care provider or pharmacist if you have questions. COMMON BRAND NAME(S): Ferrlecit, Nulecit What should I tell my care team before I take this medication? They need to know if you have any of the following conditions: Anemia that is not from iron deficiency High levels of iron in the blood An unusual or allergic reaction to iron, other medications, foods, dyes, or preservatives Pregnant or are trying to become pregnant Breast-feeding How should I use this medication? This medication is injected into a vein. It is given by your care team in a hospital or clinic setting. Talk to your care team about the use of this medication in children. While it may be prescribed for children as young as 6 years for selected conditions, precautions do apply. Overdosage: If you think you have taken too much of this medicine contact a poison control center or emergency room at once. NOTE: This medicine is only for you. Do not share this medicine with others. What if I miss a dose? It is important not to miss your dose. Call your care team if you are unable to keep an appointment. What may interact with this medication? Do not take this medication with any of the following: Deferasirox Deferoxamine Dimercaprol This medication may also interact with the following: Other iron products This list may not describe all possible interactions. Give your health care provider a list of all the medicines, herbs, non-prescription drugs, or dietary supplements you use. Also tell them if you smoke, drink  alcohol, or use illegal drugs. Some items may interact with your medicine. What should I watch for while using this medication? Your condition will be monitored carefully while you are receiving this medication. Visit your care team for regular checks on your progress. You may need blood work while you are taking this medication. What side effects may I notice from receiving this medication? Side effects that you should report to your care team as soon as possible: Allergic reactions--skin rash, itching, hives, swelling of the face, lips, tongue, or throat Low blood pressure--dizziness, feeling faint or lightheaded, blurry vision Shortness of breath Side effects that usually do not require medical attention (report to your care team if they continue or are bothersome): Flushing Headache Joint pain Muscle pain Nausea Pain, redness, or irritation at injection site This list may not describe all possible side effects. Call your doctor for medical advice about side effects. You may report side effects to FDA at 1-800-FDA-1088. Where should I keep my medication? This medication is given in a hospital or clinic and will not be stored at home. NOTE: This sheet is a summary. It may not cover all possible information. If you have questions about this medicine, talk to your doctor, pharmacist, or health care provider.  2023 Elsevier/Gold Standard (2021-02-18 00:00:00)  

## 2022-03-30 DIAGNOSIS — Z5181 Encounter for therapeutic drug level monitoring: Secondary | ICD-10-CM | POA: Diagnosis not present

## 2022-03-30 DIAGNOSIS — I480 Paroxysmal atrial fibrillation: Secondary | ICD-10-CM | POA: Diagnosis not present

## 2022-03-30 DIAGNOSIS — R791 Abnormal coagulation profile: Secondary | ICD-10-CM | POA: Diagnosis not present

## 2022-03-30 DIAGNOSIS — Z7901 Long term (current) use of anticoagulants: Secondary | ICD-10-CM | POA: Diagnosis not present

## 2022-04-18 DIAGNOSIS — Z7901 Long term (current) use of anticoagulants: Secondary | ICD-10-CM | POA: Diagnosis not present

## 2022-04-18 DIAGNOSIS — Z5181 Encounter for therapeutic drug level monitoring: Secondary | ICD-10-CM | POA: Diagnosis not present

## 2022-04-18 DIAGNOSIS — I480 Paroxysmal atrial fibrillation: Secondary | ICD-10-CM | POA: Diagnosis not present

## 2022-05-04 ENCOUNTER — Telehealth: Payer: Self-pay | Admitting: Family Medicine

## 2022-05-04 NOTE — Telephone Encounter (Signed)
Patient called to request her oxybutynin (DITROPAN XL) 15 MG 24 hr tablet but the request was denied and patient is unclear as to why since she's been taking it for years. Patient had another provider who retired which is why she changed to [place]. Patient has 2 pills left and she is really concerned. Please call her to advise.

## 2022-05-05 ENCOUNTER — Telehealth: Payer: Self-pay | Admitting: Family Medicine

## 2022-05-05 MED ORDER — OXYBUTYNIN CHLORIDE ER 15 MG PO TB24
15.0000 mg | ORAL_TABLET | Freq: Every day | ORAL | 3 refills | Status: DC
Start: 2022-05-05 — End: 2023-05-24

## 2022-05-05 NOTE — Telephone Encounter (Signed)
Medication:   amLODipine (NORVASC) 5 MG tablet [[id number]]   irbesartan (AVAPRO) 300 MG tablet [[id number]]   omeprazole (PRILOSEC) 40 MG capsule [[id number]]  Has the patient contacted their pharmacy? No. (If no, request that the patient contact the pharmacy for the refill.) (If yes, when and what did the pharmacy advise?)  Preferred Pharmacy (with phone number or street name):   [facility] [address] - [place], [place] - [address] [place] [address]  [address], [place] [address]  Phone:  [phone]  Fax:  [phone]   Agent: Please be advised that RX refills may take up to 3 business days. We ask that you follow-up with your pharmacy.

## 2022-05-05 NOTE — Telephone Encounter (Signed)
Okay for refill on this rx?

## 2022-05-08 ENCOUNTER — Other Ambulatory Visit: Payer: Self-pay

## 2022-05-08 MED ORDER — IRBESARTAN 300 MG PO TABS: 300.0000 mg | ORAL_TABLET | Freq: Every day | ORAL | 0 refills | Status: DC

## 2022-05-08 MED ORDER — OMEPRAZOLE 40 MG PO CPDR: 40.0000 mg | DELAYED_RELEASE_CAPSULE | Freq: Two times a day (BID) | ORAL | 0 refills | Status: DC

## 2022-05-08 MED ORDER — AMLODIPINE BESYLATE 5 MG PO TABS: 5.0000 mg | ORAL_TABLET | Freq: Every day | ORAL | 0 refills | Status: DC

## 2022-05-08 NOTE — Telephone Encounter (Signed)
Refills sent

## 2022-05-09 DIAGNOSIS — Z5181 Encounter for therapeutic drug level monitoring: Secondary | ICD-10-CM | POA: Diagnosis not present

## 2022-05-09 DIAGNOSIS — Z7901 Long term (current) use of anticoagulants: Secondary | ICD-10-CM | POA: Diagnosis not present

## 2022-05-09 DIAGNOSIS — I480 Paroxysmal atrial fibrillation: Secondary | ICD-10-CM | POA: Diagnosis not present

## 2022-05-27 ENCOUNTER — Other Ambulatory Visit: Payer: Self-pay | Admitting: Family Medicine

## 2022-06-07 DIAGNOSIS — Z5181 Encounter for therapeutic drug level monitoring: Secondary | ICD-10-CM | POA: Diagnosis not present

## 2022-06-07 DIAGNOSIS — Z7901 Long term (current) use of anticoagulants: Secondary | ICD-10-CM | POA: Diagnosis not present

## 2022-06-07 DIAGNOSIS — I480 Paroxysmal atrial fibrillation: Secondary | ICD-10-CM | POA: Diagnosis not present

## 2022-06-09 ENCOUNTER — Encounter: Payer: Self-pay | Admitting: Family

## 2022-06-09 ENCOUNTER — Inpatient Hospital Stay (HOSPITAL_BASED_OUTPATIENT_CLINIC_OR_DEPARTMENT_OTHER): Payer: Medicare HMO | Admitting: Family

## 2022-06-09 ENCOUNTER — Inpatient Hospital Stay: Payer: Medicare HMO | Attending: Hematology & Oncology

## 2022-06-09 ENCOUNTER — Telehealth: Payer: Self-pay | Admitting: *Deleted

## 2022-06-09 VITALS — BP 160/66 | HR 71 | Temp 97.9°F | Resp 19 | Wt 201.0 lb

## 2022-06-09 DIAGNOSIS — Z79899 Other long term (current) drug therapy: Secondary | ICD-10-CM | POA: Diagnosis not present

## 2022-06-09 DIAGNOSIS — D509 Iron deficiency anemia, unspecified: Secondary | ICD-10-CM | POA: Diagnosis not present

## 2022-06-09 LAB — CBC WITH DIFFERENTIAL (CANCER CENTER ONLY)
Abs Immature Granulocytes: 0.05 10*3/uL (ref 0.00–0.07)
Basophils Absolute: 0 10*3/uL (ref 0.0–0.1)
Basophils Relative: 0 %
Eosinophils Absolute: 0.3 10*3/uL (ref 0.0–0.5)
Eosinophils Relative: 3 %
HCT: 42.9 % (ref 36.0–46.0)
Hemoglobin: 13.7 g/dL (ref 12.0–15.0)
Immature Granulocytes: 1 %
Lymphocytes Relative: 20 %
Lymphs Abs: 1.9 10*3/uL (ref 0.7–4.0)
MCH: 27.3 pg (ref 26.0–34.0)
MCHC: 31.9 g/dL (ref 30.0–36.0)
MCV: 85.5 fL (ref 80.0–100.0)
Monocytes Absolute: 0.7 10*3/uL (ref 0.1–1.0)
Monocytes Relative: 7 %
Neutro Abs: 6.7 10*3/uL (ref 1.7–7.7)
Neutrophils Relative %: 69 %
Platelet Count: 272 10*3/uL (ref 150–400)
RBC: 5.02 MIL/uL (ref 3.87–5.11)
RDW: 16 % — ABNORMAL HIGH (ref 11.5–15.5)
WBC Count: 9.8 10*3/uL (ref 4.0–10.5)
nRBC: 0 % (ref 0.0–0.2)

## 2022-06-09 LAB — FERRITIN: Ferritin: 10 ng/mL — ABNORMAL LOW (ref 11–307)

## 2022-06-09 NOTE — Progress Notes (Signed)
Hematology and Oncology Follow Up Visit  Loretta Gates [id number] 06/09/1936 86 y.o. 06/09/2022   Principle Diagnosis:  Iron deficiency anemia    Current Therapy:        IV iron as indicated    Interim History:  Loretta Gates is here today for follow-up. She is feeling much better and had a wonderful time at the beach last week with her family.  Her energy is improved.  Hgb is now up to 13.7, MCV 85, platelets 272.  No blood loss noted. No bruising or petechiae.  No fever, chills, n/v, cough, rash, dizziness, SOB, chest pain, palpitations, abdominal pain or changes in bowel or bladder habits.  No swelling, tenderness, numbness or tingling in her extremities.  No falls or syncope.  Appetite and hydration are good. Weight is stable at 201 lbs.   ECOG Performance Status: 1 - Symptomatic but completely ambulatory  Medications:  Allergies as of 06/09/2022       Reactions   Bee Venom Swelling   Bee stung her on the inside of her gum. Lips and gums started swelling. No SOB.   Levaquin [levofloxacin] Hives, Itching        Medication List        Accurate as of June 09, 2022  2:24 PM. If you have any questions, ask your nurse or doctor.          amLODipine 5 MG tablet Commonly known as: NORVASC Take 1 tablet (5 mg total) by mouth at bedtime.   cholestyramine 4 g packet Commonly known as: QUESTRAN Take 4 g by mouth once.   denosumab 60 MG/ML Sosy injection Commonly known as: PROLIA Inject 60 mg into the skin every 6 (six) months.   ergocalciferol 1.25 MG (50000 UT) capsule Commonly known as: VITAMIN D2 Take 50,000 Units by mouth every 14 (fourteen) days.   furosemide 20 MG tablet Commonly known as: LASIX Take one by mouth daily as needed for swelling   HYDROcodone-acetaminophen 5-325 MG tablet Commonly known as: NORCO/VICODIN Take 1 tablet by mouth every 6 (six) hours as needed for moderate pain.   irbesartan 300 MG tablet Commonly known as: AVAPRO Take 1  tablet (300 mg total) by mouth daily.   metoprolol succinate 25 MG 24 hr tablet Commonly known as: TOPROL-XL Take 12.5 mg by mouth in the morning and at bedtime.   omeprazole 40 MG capsule Commonly known as: PRILOSEC Take 1 capsule (40 mg total) by mouth 2 (two) times daily before a meal.   oxybutynin 15 MG 24 hr tablet Commonly known as: DITROPAN XL Take 1 tablet (15 mg total) by mouth at bedtime.   warfarin 5 MG tablet Commonly known as: COUMADIN Take 5 mg by mouth daily. TAKE 1/2 TAB ON MON-WED-FRI        Allergies:  Allergies  Allergen Reactions   Bee Venom Swelling    Bee stung her on the inside of her gum. Lips and gums started swelling. No SOB.   Levaquin [Levofloxacin] Hives and Itching    Past Medical History, Surgical history, Social history, and Family History were reviewed and updated.  Review of Systems: All other 10 point review of systems is negative.   Physical Exam:  weight is 201 lb (91.2 kg). Her oral temperature is 97.9 F (36.6 C). Her blood pressure is 160/66 (abnormal) and her pulse is 71. Her respiration is 19 and oxygen saturation is 100%.   Wt Readings from Last 3 Encounters:  06/09/22 201 lb (91.2  kg)  03/10/22 201 lb (91.2 kg)  01/27/22 198 lb (89.8 kg)    Ocular: Sclerae unicteric, pupils equal, round and reactive to light Ear-nose-throat: Oropharynx clear, dentition fair Lymphatic: No cervical or supraclavicular adenopathy Lungs no rales or rhonchi, good excursion bilaterally Heart regular rate and rhythm, no murmur appreciated Abd soft, nontender, positive bowel sounds MSK no focal spinal tenderness, no joint edema Neuro: non-focal, well-oriented, appropriate affect Breasts: Deferred   Lab Results  Component Value Date   WBC 9.8 06/09/2022   HGB 13.7 06/09/2022   HCT 42.9 06/09/2022   MCV 85.5 06/09/2022   PLT 272 06/09/2022   Lab Results  Component Value Date   FERRITIN 8 (L) 03/10/2022   IRON 43 03/10/2022   TIBC 481  (H) 03/10/2022   UIBC 438 03/10/2022   IRONPCTSAT 9 (L) 03/10/2022   Lab Results  Component Value Date   RETICCTPCT 1.7 03/10/2022   RBC 5.02 06/09/2022   No results found for: "KPAFRELGTCHN", "LAMBDASER", "KAPLAMBRATIO" No results found for: "IGGSERUM", "IGA", "IGMSERUM" No results found for: "TOTALPROTELP", "ALBUMINELP", "A1GS", "A2GS", "BETS", "BETA2SER", "GAMS", "MSPIKE", "SPEI"   Chemistry      Component Value Date/Time   NA 139 03/10/2022 1011   K 4.4 03/10/2022 1011   CL 105 03/10/2022 1011   CO2 28 03/10/2022 1011   BUN 20 03/10/2022 1011   CREATININE 0.68 03/10/2022 1011      Component Value Date/Time   CALCIUM 11.3 (H) 03/10/2022 1011   ALKPHOS 65 03/10/2022 1011   AST 20 03/10/2022 1011   ALT 26 03/10/2022 1011   BILITOT 0.5 03/10/2022 1011       Impression and Plan: Loretta Gates is a very pleasant 59 to caucasian female with recent diagnosis of iron deficiency anemia.  Iron deficiency anemia.  Follow-up in 4 months.   [name], NP 9/1/20232:24 PM

## 2022-06-09 NOTE — Telephone Encounter (Signed)
Per 06/09/22 los - mailed upcoming appointments

## 2022-06-13 LAB — IRON AND IRON BINDING CAPACITY (CC-WL,HP ONLY)
Iron: 42 ug/dL (ref 28–170)
Saturation Ratios: 9 % — ABNORMAL LOW (ref 10.4–31.8)
TIBC: 477 ug/dL — ABNORMAL HIGH (ref 250–450)
UIBC: 435 ug/dL (ref 148–442)

## 2022-06-26 ENCOUNTER — Inpatient Hospital Stay: Payer: Medicare HMO

## 2022-06-26 VITALS — BP 157/58 | HR 57 | Temp 98.1°F | Resp 17

## 2022-06-26 DIAGNOSIS — Z79899 Other long term (current) drug therapy: Secondary | ICD-10-CM | POA: Diagnosis not present

## 2022-06-26 DIAGNOSIS — D509 Iron deficiency anemia, unspecified: Secondary | ICD-10-CM

## 2022-06-26 MED ORDER — SODIUM CHLORIDE 0.9 % IV SOLN: Freq: Once | INTRAVENOUS | Status: AC

## 2022-06-26 MED ORDER — SODIUM CHLORIDE 0.9 % IV SOLN
125.0000 mg | Freq: Once | INTRAVENOUS | Status: AC
  Administered 2022-06-26: 125 mg via INTRAVENOUS
  Filled 2022-06-26: qty 10

## 2022-06-26 NOTE — Progress Notes (Signed)
Pt declined to stay for post infusion observation period. Pt stated she has tolerated medication multiple times prior without difficulty. Pt aware to call clinic with any questions or concerns. Pt verbalized understanding and had no further questions.  ? ?

## 2022-06-26 NOTE — Patient Instructions (Addendum)
Sodium Ferric Gluconate Complex Injection What is this medication? SODIUM FERRIC GLUCONATE COMPLEX (SOE dee um FER ik GLOO koe nate KOM pleks) treats low levels of iron (iron deficiency anemia) in people with kidney disease. Iron is a mineral that plays an important role in making red blood cells, which carry oxygen from your lungs to the rest of your body. This medicine may be used for other purposes; ask your health care provider or pharmacist if you have questions. COMMON BRAND NAME(S): Ferrlecit, Nulecit What should I tell my care team before I take this medication? They need to know if you have any of the following conditions: Anemia that is not from iron deficiency High levels of iron in the blood An unusual or allergic reaction to iron, other medications, foods, dyes, or preservatives Pregnant or are trying to become pregnant Breast-feeding How should I use this medication? This medication is injected into a vein. It is given by your care team in a hospital or clinic setting. Talk to your care team about the use of this medication in children. While it may be prescribed for children as young as 6 years for selected conditions, precautions do apply. Overdosage: If you think you have taken too much of this medicine contact a poison control center or emergency room at once. NOTE: This medicine is only for you. Do not share this medicine with others. What if I miss a dose? It is important not to miss your dose. Call your care team if you are unable to keep an appointment. What may interact with this medication? Do not take this medication with any of the following: Deferasirox Deferoxamine Dimercaprol This medication may also interact with the following: Other iron products This list may not describe all possible interactions. Give your health care provider a list of all the medicines, herbs, non-prescription drugs, or dietary supplements you use. Also tell them if you smoke, drink  alcohol, or use illegal drugs. Some items may interact with your medicine. What should I watch for while using this medication? Your condition will be monitored carefully while you are receiving this medication. Visit your care team for regular checks on your progress. You may need blood work while you are taking this medication. What side effects may I notice from receiving this medication? Side effects that you should report to your care team as soon as possible: Allergic reactions--skin rash, itching, hives, swelling of the face, lips, tongue, or throat Low blood pressure--dizziness, feeling faint or lightheaded, blurry vision Shortness of breath Side effects that usually do not require medical attention (report to your care team if they continue or are bothersome): Flushing Headache Joint pain Muscle pain Nausea Pain, redness, or irritation at injection site This list may not describe all possible side effects. Call your doctor for medical advice about side effects. You may report side effects to FDA at 1-800-FDA-1088. Where should I keep my medication? This medication is given in a hospital or clinic and will not be stored at home. NOTE: This sheet is a summary. It may not cover all possible information. If you have questions about this medicine, talk to your doctor, pharmacist, or health care provider.  2023 Elsevier/Gold Standard (2021-02-18 00:00:00)  

## 2022-07-03 ENCOUNTER — Inpatient Hospital Stay: Payer: Medicare HMO

## 2022-07-03 VITALS — BP 129/78 | HR 67 | Temp 97.6°F | Resp 18

## 2022-07-03 DIAGNOSIS — Z79899 Other long term (current) drug therapy: Secondary | ICD-10-CM | POA: Diagnosis not present

## 2022-07-03 DIAGNOSIS — D509 Iron deficiency anemia, unspecified: Secondary | ICD-10-CM | POA: Diagnosis not present

## 2022-07-03 MED ORDER — SODIUM CHLORIDE 0.9 % IV SOLN
125.0000 mg | Freq: Once | INTRAVENOUS | Status: AC
  Administered 2022-07-03: 125 mg via INTRAVENOUS
  Filled 2022-07-03: qty 10

## 2022-07-03 MED ORDER — SODIUM CHLORIDE 0.9 % IV SOLN: Freq: Once | INTRAVENOUS | Status: AC

## 2022-07-03 NOTE — Patient Instructions (Signed)
Sodium Ferric Gluconate Complex Injection What is this medication? SODIUM FERRIC GLUCONATE COMPLEX (SOE dee um FER ik GLOO koe nate KOM pleks) treats low levels of iron (iron deficiency anemia) in people with kidney disease. Iron is a mineral that plays an important role in making red blood cells, which carry oxygen from your lungs to the rest of your body. This medicine may be used for other purposes; ask your health care provider or pharmacist if you have questions. COMMON BRAND NAME(S): Ferrlecit, Nulecit What should I tell my care team before I take this medication? They need to know if you have any of the following conditions: Anemia that is not from iron deficiency High levels of iron in the blood An unusual or allergic reaction to iron, other medications, foods, dyes, or preservatives Pregnant or are trying to become pregnant Breast-feeding How should I use this medication? This medication is injected into a vein. It is given by your care team in a hospital or clinic setting. Talk to your care team about the use of this medication in children. While it may be prescribed for children as young as 6 years for selected conditions, precautions do apply. Overdosage: If you think you have taken too much of this medicine contact a poison control center or emergency room at once. NOTE: This medicine is only for you. Do not share this medicine with others. What if I miss a dose? It is important not to miss your dose. Call your care team if you are unable to keep an appointment. What may interact with this medication? Do not take this medication with any of the following: Deferasirox Deferoxamine Dimercaprol This medication may also interact with the following: Other iron products This list may not describe all possible interactions. Give your health care provider a list of all the medicines, herbs, non-prescription drugs, or dietary supplements you use. Also tell them if you smoke, drink  alcohol, or use illegal drugs. Some items may interact with your medicine. What should I watch for while using this medication? Your condition will be monitored carefully while you are receiving this medication. Visit your care team for regular checks on your progress. You may need blood work while you are taking this medication. What side effects may I notice from receiving this medication? Side effects that you should report to your care team as soon as possible: Allergic reactions--skin rash, itching, hives, swelling of the face, lips, tongue, or throat Low blood pressure--dizziness, feeling faint or lightheaded, blurry vision Shortness of breath Side effects that usually do not require medical attention (report to your care team if they continue or are bothersome): Flushing Headache Joint pain Muscle pain Nausea Pain, redness, or irritation at injection site This list may not describe all possible side effects. Call your doctor for medical advice about side effects. You may report side effects to FDA at 1-800-FDA-1088. Where should I keep my medication? This medication is given in a hospital or clinic and will not be stored at home. NOTE: This sheet is a summary. It may not cover all possible information. If you have questions about this medicine, talk to your doctor, pharmacist, or health care provider.  2023 Elsevier/Gold Standard (2021-02-18 00:00:00)  

## 2022-07-07 NOTE — Progress Notes (Unsigned)
[facility] [place] [address], [place], [place] [address] [phone]  Date:  07/12/2022   Name:  Loretta Gates   DOB:  [birth year]   MRN:  [id number]  PCP:  Darreld Mclean, MD    Chief Complaint: No chief complaint on file.   History of Present Illness:  Loretta Gates is a 86 y.o. very pleasant female patient who presents with the following:  Patient seen today for 23-monthfollow-up Most recent visit with myself was in April of this year History of hypertension, hyperparathyroidism status post parathyroidectomy, a fib.  She sees endocrinology annually- Dr PPosey Pronto with [facility] History of mild anemia for the last couple of years Left-sided breast cancer treated mastectomy in 2008-left-sided mastectomy only She establish care with me earlier this year as her previous PCP had retired  Recommend Shingrix Recommend COVID shot Flu Mammogram right breast:  DEXA scan  Amlodipine 5 Prolia Furosemide 20 mg p.o. daily as needed Irbersartan 300 Toprol-XL 25 Prilosec Oxybutynin Coumadin  She continues to follow-up regularly with anticoagulation clinic  CMP, CBC up-to-date Can offer other lab work if she is interested Patient Active Problem List   Diagnosis Date Noted   IDA (iron deficiency anemia) 01/13/2022   Atrial fibrillation ([facility] 01/05/2022   Breast cancer ([facility] 01/05/2022   Primary hyperparathyroidism ([facility] 01/05/2022   Essential hypertension 01/05/2022   Age-related nuclear cataract of both eyes 12/11/2018   Abnormal INR 01/29/2018   Long term (current) use of anticoagulants 01/29/2018   Encounter for therapeutic drug monitoring 01/29/2018   Vitamin D deficiency 05/03/2017   S/P laparoscopic cholecystectomy 02/26/2017   Calculus of gallbladder without cholecystitis without obstruction 02/23/2017   Diarrhea of presumed infectious origin 02/23/2017   Hyperglycemia 02/23/2017   Right upper quadrant  abdominal pain 02/23/2017   Other osteoporosis without current pathological fracture 02/23/2017   Localized edema 01/18/2016   Age-related osteoporosis without current pathological fracture 01/17/2016   Diverticulosis of large intestine without hemorrhage 01/17/2016   GERD (gastroesophageal reflux disease) 01/17/2016   History of left breast cancer 01/17/2016   Multinodular goiter 01/17/2016   OAB (overactive bladder) 01/17/2016   History of cancer chemotherapy 03/06/2015   History of left mastectomy 03/06/2015   Malignant neoplasm of overlapping sites of left female breast ([facility] 03/06/2015    Past Medical History:  Diagnosis Date   Anemia    Atrial fibrillation ([facility]    on Coumadin   Breast mass, right    Cancer ([facility]    LEFT BREAST-Mastectomy   GERD (gastroesophageal reflux disease)    Hypertension    OAB (overactive bladder)    PONV (postoperative nausea and vomiting)     Past Surgical History:  Procedure Laterality Date   BREAST LUMPECTOMY WITH RADIOACTIVE SEED LOCALIZATION Right 10/25/2021   Procedure: RIGHT BREAST LUMPECTOMY WITH RADIOACTIVE SEED LOCALIZATION;  Surgeon: CErroll Luna MD;  [facility]  Service: General;  Laterality: Right;   CHOLECYSTECTOMY     MASTECTOMY Left 2010   OVARIAN CYST REMOVAL     PARATHYROIDECTOMY      Social History   Tobacco Use   Smoking status: Never   Smokeless tobacco: Never  Vaping Use   Vaping Use: Never used  Substance Use Topics   Alcohol use: Never   Drug use: Never    No family history on file.  Allergies  Allergen Reactions   Bee Venom Swelling  Bee stung her on the inside of her gum. Lips and gums started swelling. No SOB.   Levaquin [Levofloxacin] Hives and Itching    Medication list has been reviewed and updated.  Current Outpatient Medications on File Prior to Visit  Medication Sig Dispense Refill   amLODipine (NORVASC) 5 MG tablet Take 1 tablet (5 mg total) by mouth at  bedtime. 90 tablet 1   cholestyramine (QUESTRAN) 4 g packet Take 4 g by mouth once.     denosumab (PROLIA) 60 MG/ML SOSY injection Inject 60 mg into the skin every 6 (six) months.     ergocalciferol (VITAMIN D2) 1.25 MG (50000 UT) capsule Take 50,000 Units by mouth every 14 (fourteen) days.     furosemide (LASIX) 20 MG tablet Take one by mouth daily as needed for swelling 30 tablet 1   HYDROcodone-acetaminophen (NORCO/VICODIN) 5-325 MG tablet Take 1 tablet by mouth every 6 (six) hours as needed for moderate pain. 15 tablet 0   irbesartan (AVAPRO) 300 MG tablet Take 1 tablet (300 mg total) by mouth daily. 90 tablet 1   metoprolol succinate (TOPROL-XL) 25 MG 24 hr tablet Take 12.5 mg by mouth in the morning and at bedtime.     omeprazole (PRILOSEC) 40 MG capsule Take 1 capsule (40 mg total) by mouth 2 (two) times daily before a meal. 180 capsule 1   oxybutynin (DITROPAN XL) 15 MG 24 hr tablet Take 1 tablet (15 mg total) by mouth at bedtime. 90 tablet 3   warfarin (COUMADIN) 5 MG tablet Take 5 mg by mouth daily. TAKE 1/2 TAB ON MON-WED-FRI     No current facility-administered medications on file prior to visit.    Review of Systems:  As per HPI- otherwise negative.   Physical Examination: There were no vitals filed for this visit. There were no vitals filed for this visit. There is no height or weight on file to calculate BMI. Ideal Body Weight:    GEN: no acute distress. HEENT: Atraumatic, Normocephalic.  Ears and Nose: No external deformity. CV: RRR, No M/G/R. No JVD. No thrill. No extra heart sounds. PULM: CTA B, no wheezes, crackles, rhonchi. No retractions. No resp. distress. No accessory muscle use. ABD: S, NT, ND, +BS. No rebound. No HSM. EXTR: No c/c/e PSYCH: Normally interactive. Conversant.    Assessment and Plan: ***  Signed Lamar Blinks, MD

## 2022-07-07 NOTE — Patient Instructions (Incomplete)
It was very nice to see you again today!   I recommend getting the new COVID vaccine and a dose of RSV this fall Also suggest getting the updated shingles vaccine, Shingrix if not done already Assuming all is well please see me in about 6 months

## 2022-07-12 ENCOUNTER — Ambulatory Visit (INDEPENDENT_AMBULATORY_CARE_PROVIDER_SITE_OTHER): Payer: Medicare HMO | Admitting: Family Medicine

## 2022-07-12 ENCOUNTER — Encounter: Payer: Self-pay | Admitting: Family Medicine

## 2022-07-12 VITALS — BP 132/80 | HR 93 | Temp 98.0°F | Resp 18 | Ht 58.5 in | Wt 199.8 lb

## 2022-07-12 DIAGNOSIS — E210 Primary hyperparathyroidism: Secondary | ICD-10-CM

## 2022-07-12 DIAGNOSIS — Z5181 Encounter for therapeutic drug level monitoring: Secondary | ICD-10-CM | POA: Diagnosis not present

## 2022-07-12 DIAGNOSIS — I4891 Unspecified atrial fibrillation: Secondary | ICD-10-CM | POA: Diagnosis not present

## 2022-07-12 DIAGNOSIS — R310 Gross hematuria: Secondary | ICD-10-CM

## 2022-07-12 DIAGNOSIS — C50912 Malignant neoplasm of unspecified site of left female breast: Secondary | ICD-10-CM

## 2022-07-12 DIAGNOSIS — D649 Anemia, unspecified: Secondary | ICD-10-CM

## 2022-07-12 DIAGNOSIS — I10 Essential (primary) hypertension: Secondary | ICD-10-CM

## 2022-07-12 LAB — CBC
HCT: 42.9 % (ref 36.0–46.0)
Hemoglobin: 14 g/dL (ref 12.0–15.0)
MCHC: 32.7 g/dL (ref 30.0–36.0)
MCV: 86.4 fl (ref 78.0–100.0)
Platelets: 222 10*3/uL (ref 150.0–400.0)
RBC: 4.96 Mil/uL (ref 3.87–5.11)
RDW: 16.7 % — ABNORMAL HIGH (ref 11.5–15.5)
WBC: 8.2 10*3/uL (ref 4.0–10.5)

## 2022-07-12 LAB — COMPREHENSIVE METABOLIC PANEL
ALT: 35 U/L (ref 0–35)
AST: 25 U/L (ref 0–37)
Albumin: 4.5 g/dL (ref 3.5–5.2)
Alkaline Phosphatase: 52 U/L (ref 39–117)
BUN: 17 mg/dL (ref 6–23)
CO2: 30 mEq/L (ref 19–32)
Calcium: 11.1 mg/dL — ABNORMAL HIGH (ref 8.4–10.5)
Chloride: 105 mEq/L (ref 96–112)
Creatinine, Ser: 0.68 mg/dL (ref 0.40–1.20)
GFR: 80.06 mL/min (ref >60.00)
Glucose, Bld: 98 mg/dL (ref 70–99)
Potassium: 3.9 mEq/L (ref 3.5–5.1)
Sodium: 142 mEq/L (ref 135–145)
Total Bilirubin: 0.7 mg/dL (ref 0.2–1.2)
Total Protein: 7.1 g/dL (ref 6.0–8.3)

## 2022-07-12 LAB — PROTIME-INR
INR: 2.8 ratio — ABNORMAL HIGH (ref 0.8–1.0)
Prothrombin Time: 28.7 s — ABNORMAL HIGH (ref 9.6–13.1)

## 2022-07-13 ENCOUNTER — Encounter: Payer: Self-pay | Admitting: Family Medicine

## 2022-07-13 ENCOUNTER — Telehealth: Payer: Self-pay

## 2022-07-13 NOTE — Addendum Note (Signed)
Addended by: Kelle Darting A on: 07/13/2022 04:46 PM   Modules accepted: Orders

## 2022-07-13 NOTE — Telephone Encounter (Signed)
error 

## 2022-07-14 ENCOUNTER — Encounter: Payer: Self-pay | Admitting: Family Medicine

## 2022-07-14 LAB — URINE CULTURE
MICRO NUMBER:: 14006805
SPECIMEN QUALITY:: ADEQUATE

## 2022-07-17 ENCOUNTER — Encounter: Payer: Self-pay | Admitting: Family Medicine

## 2022-07-17 ENCOUNTER — Other Ambulatory Visit (INDEPENDENT_AMBULATORY_CARE_PROVIDER_SITE_OTHER): Payer: Medicare HMO

## 2022-07-17 DIAGNOSIS — R310 Gross hematuria: Secondary | ICD-10-CM

## 2022-07-17 LAB — POC URINALSYSI DIPSTICK (AUTOMATED)
Bilirubin, UA: NEGATIVE
Glucose, UA: NEGATIVE
Ketones, UA: NEGATIVE
Nitrite, UA: NEGATIVE
Protein, UA: NEGATIVE
Spec Grav, UA: 1.01 (ref 1.010–1.025)
Urobilinogen, UA: 0.2 E.U./dL
pH, UA: 7 (ref 5.0–8.0)

## 2022-07-19 ENCOUNTER — Other Ambulatory Visit (INDEPENDENT_AMBULATORY_CARE_PROVIDER_SITE_OTHER): Payer: Medicare HMO

## 2022-07-19 DIAGNOSIS — I480 Paroxysmal atrial fibrillation: Secondary | ICD-10-CM | POA: Diagnosis not present

## 2022-07-19 DIAGNOSIS — R310 Gross hematuria: Secondary | ICD-10-CM

## 2022-07-19 DIAGNOSIS — I272 Pulmonary hypertension, unspecified: Secondary | ICD-10-CM | POA: Diagnosis not present

## 2022-07-19 DIAGNOSIS — Z6841 Body Mass Index (BMI) 40.0 and over, adult: Secondary | ICD-10-CM | POA: Diagnosis not present

## 2022-07-19 DIAGNOSIS — Z7901 Long term (current) use of anticoagulants: Secondary | ICD-10-CM | POA: Diagnosis not present

## 2022-07-19 DIAGNOSIS — I10 Essential (primary) hypertension: Secondary | ICD-10-CM | POA: Diagnosis not present

## 2022-07-19 DIAGNOSIS — R9431 Abnormal electrocardiogram [ECG] [EKG]: Secondary | ICD-10-CM | POA: Diagnosis not present

## 2022-07-19 LAB — URINALYSIS, MICROSCOPIC ONLY

## 2022-07-19 NOTE — Progress Notes (Signed)
No charge today.

## 2022-07-26 DIAGNOSIS — I480 Paroxysmal atrial fibrillation: Secondary | ICD-10-CM | POA: Diagnosis not present

## 2022-07-26 DIAGNOSIS — Z7901 Long term (current) use of anticoagulants: Secondary | ICD-10-CM | POA: Diagnosis not present

## 2022-07-26 DIAGNOSIS — Z5181 Encounter for therapeutic drug level monitoring: Secondary | ICD-10-CM | POA: Diagnosis not present

## 2022-08-21 DIAGNOSIS — M810 Age-related osteoporosis without current pathological fracture: Secondary | ICD-10-CM | POA: Diagnosis not present

## 2022-08-29 ENCOUNTER — Ambulatory Visit: Payer: Medicare HMO | Admitting: Orthopaedic Surgery

## 2022-08-29 DIAGNOSIS — M170 Bilateral primary osteoarthritis of knee: Secondary | ICD-10-CM | POA: Diagnosis not present

## 2022-08-29 MED ORDER — METHYLPREDNISOLONE ACETATE 40 MG/ML IJ SUSP
40.0000 mg | INTRAMUSCULAR | Status: AC | PRN
  Administered 2022-08-29: 40 mg via INTRA_ARTICULAR

## 2022-08-29 MED ORDER — BUPIVACAINE HCL 0.5 % IJ SOLN
2.0000 mL | INTRAMUSCULAR | Status: AC | PRN
  Administered 2022-08-29: 2 mL via INTRA_ARTICULAR

## 2022-08-29 MED ORDER — LIDOCAINE HCL 1 % IJ SOLN
2.0000 mL | INTRAMUSCULAR | Status: AC | PRN
  Administered 2022-08-29: 2 mL

## 2022-08-29 NOTE — Progress Notes (Signed)
Office Visit Note   Patient: Loretta Gates           Date of Birth: 14-Feb-1936           MRN: [id number] Visit Date: 08/29/2022              Requested by: Loretta Mclean, MD [address] [place],  [place] [id number] PCP: Loretta Mclean, MD   Assessment & Plan: Visit Diagnoses:  1. Bilateral primary osteoarthritis of knee     Plan: Impression is bilateral severe knee osteoarthritis.  Cortisone injections repeated today.  Also mentioned viscosupplementation.  We will see her back as needed.  Follow-Up Instructions: No follow-ups on file.   Orders:  No orders of the defined types were placed in this encounter.  No orders of the defined types were placed in this encounter.     Procedures: Large Joint Inj: bilateral knee on 08/29/2022 3:15 PM Indications: pain Details: 22 G needle  Arthrogram: No  Medications (Right): 2 mL lidocaine 1 %; 2 mL bupivacaine 0.5 %; 40 mg methylPREDNISolone acetate 40 MG/ML Medications (Left): 2 mL lidocaine 1 %; 2 mL bupivacaine 0.5 %; 40 mg methylPREDNISolone acetate 40 MG/ML Outcome: tolerated well, no immediate complications Patient was prepped and draped in the usual sterile fashion.       Clinical Data: No additional findings.   Subjective: Chief Complaint  Patient presents with   Right Knee - Pain   Left Knee - Pain    HPI Loretta Gates returns today for severe bilateral knee osteoarthritis.  She has bone-on-bone changes.  Last cortisone injection was done about 7 months ago.  She did feel good relief from these injections.  Requesting another round today.  Review of Systems   Objective: Vital Signs: There were no vitals taken for this visit.  Physical Exam  Ortho Exam Examination of bilateral knees is unchanged. Specialty Comments:  No specialty comments available.  Imaging: No results found.   PMFS History: Patient Active Problem List   Diagnosis Date Noted   IDA (iron deficiency anemia)  01/13/2022   Atrial fibrillation ([place]) 01/05/2022   Breast cancer ([place]) 01/05/2022   Primary hyperparathyroidism ([place]) 01/05/2022   Essential hypertension 01/05/2022   Age-related nuclear cataract of both eyes 12/11/2018   Abnormal INR 01/29/2018   Long term (current) use of anticoagulants 01/29/2018   Encounter for therapeutic drug monitoring 01/29/2018   Vitamin D deficiency 05/03/2017   S/P laparoscopic cholecystectomy 02/26/2017   Calculus of gallbladder without cholecystitis without obstruction 02/23/2017   Diarrhea of presumed infectious origin 02/23/2017   Hyperglycemia 02/23/2017   Right upper quadrant abdominal pain 02/23/2017   Localized edema 01/18/2016   Age-related osteoporosis without current pathological fracture 01/17/2016   Diverticulosis of large intestine without hemorrhage 01/17/2016   GERD (gastroesophageal reflux disease) 01/17/2016   History of left breast cancer 01/17/2016   Multinodular goiter 01/17/2016   OAB (overactive bladder) 01/17/2016   History of cancer chemotherapy 03/06/2015   History of left mastectomy 03/06/2015   Malignant neoplasm of overlapping sites of left female breast ([place]) 03/06/2015   Past Medical History:  Diagnosis Date   Anemia    Atrial fibrillation (HCC)    on Coumadin   Breast mass, right    Cancer (HCC)    LEFT BREAST-Mastectomy   GERD (gastroesophageal reflux disease)    Hypertension    OAB (overactive bladder)    PONV (postoperative nausea and vomiting)     No family  history on file.  Past Surgical History:  Procedure Laterality Date   BREAST LUMPECTOMY WITH RADIOACTIVE SEED LOCALIZATION Right 10/25/2021   Procedure: RIGHT BREAST LUMPECTOMY WITH RADIOACTIVE SEED LOCALIZATION;  Surgeon: Erroll Luna, MD;  Location: [facility];  Service: General;  Laterality: Right;   CHOLECYSTECTOMY     MASTECTOMY Left 2010   OVARIAN CYST REMOVAL     PARATHYROIDECTOMY     Social History   Occupational History    Not on file  Tobacco Use   Smoking status: Never   Smokeless tobacco: Never  Vaping Use   Vaping Use: Never used  Substance and Sexual Activity   Alcohol use: Never   Drug use: Never   Sexual activity: Not Currently    Birth control/protection: Post-menopausal

## 2022-09-20 DIAGNOSIS — Z7901 Long term (current) use of anticoagulants: Secondary | ICD-10-CM | POA: Diagnosis not present

## 2022-09-20 DIAGNOSIS — Z5181 Encounter for therapeutic drug level monitoring: Secondary | ICD-10-CM | POA: Diagnosis not present

## 2022-09-20 DIAGNOSIS — I480 Paroxysmal atrial fibrillation: Secondary | ICD-10-CM | POA: Diagnosis not present

## 2022-10-10 ENCOUNTER — Inpatient Hospital Stay (HOSPITAL_BASED_OUTPATIENT_CLINIC_OR_DEPARTMENT_OTHER): Payer: Medicare HMO | Admitting: Family

## 2022-10-10 ENCOUNTER — Inpatient Hospital Stay: Payer: Medicare HMO | Attending: Hematology & Oncology

## 2022-10-10 ENCOUNTER — Encounter: Payer: Self-pay | Admitting: Family

## 2022-10-10 VITALS — BP 102/81 | HR 81 | Temp 98.1°F | Resp 20

## 2022-10-10 DIAGNOSIS — D509 Iron deficiency anemia, unspecified: Secondary | ICD-10-CM | POA: Diagnosis not present

## 2022-10-10 LAB — CBC WITH DIFFERENTIAL (CANCER CENTER ONLY)
Abs Immature Granulocytes: 0.06 K/uL (ref 0.00–0.07)
Basophils Absolute: 0 K/uL (ref 0.0–0.1)
Basophils Relative: 0 %
Eosinophils Absolute: 0.2 K/uL (ref 0.0–0.5)
Eosinophils Relative: 2 %
HCT: 42.1 % (ref 36.0–46.0)
Hemoglobin: 13.6 g/dL (ref 12.0–15.0)
Immature Granulocytes: 1 %
Lymphocytes Relative: 19 %
Lymphs Abs: 2.1 K/uL (ref 0.7–4.0)
MCH: 29.4 pg (ref 26.0–34.0)
MCHC: 32.3 g/dL (ref 30.0–36.0)
MCV: 90.9 fL (ref 80.0–100.0)
Monocytes Absolute: 0.8 K/uL (ref 0.1–1.0)
Monocytes Relative: 7 %
Neutro Abs: 8 K/uL — ABNORMAL HIGH (ref 1.7–7.7)
Neutrophils Relative %: 71 %
Platelet Count: 271 K/uL (ref 150–400)
RBC: 4.63 MIL/uL (ref 3.87–5.11)
RDW: 14 % (ref 11.5–15.5)
WBC Count: 11.1 K/uL — ABNORMAL HIGH (ref 4.0–10.5)
nRBC: 0 % (ref 0.0–0.2)

## 2022-10-10 LAB — RETICULOCYTES
Immature Retic Fract: 22.9 % — ABNORMAL HIGH (ref 2.3–15.9)
RBC.: 4.59 MIL/uL (ref 3.87–5.11)
Retic Count, Absolute: 98.2 K/uL (ref 19.0–186.0)
Retic Ct Pct: 2.1 % (ref 0.4–3.1)

## 2022-10-10 LAB — FERRITIN: Ferritin: 10 ng/mL — ABNORMAL LOW (ref 11–307)

## 2022-10-10 NOTE — Progress Notes (Signed)
Hematology and Oncology Follow Up Visit  Loretta Gates [id number] 06-Oct-1936 87 y.o. 10/10/2022   Principle Diagnosis:  Iron deficiency anemia    Current Therapy:        IV iron as indicated    Interim History:  Loretta Gates is here today for follow-up. She is feeling fatigued, mild SOB with over exertion and states that she has been quite busy with the holidays and caring for her husband.  She has not noted any recent blood loss. She states that she occasionally has blood in her urine and that this has been occurring for years.  No bruising or petechiae.  Hgb is stable at 13.6, MCV 90, platelets 271 and WBC count 11.1.  No fever, chills, n/v, cough, rash, dizziness, chest pain, palpitations, abdominal pain or changes in bowel or bladder habits.  No swelling, tenderness, numbness or tingling in her extremities.  No falls or syncope reported. She states that her balance is off. She ambulate with a cane for added support.  Appetite and hydration have been good. Weight is stable at 204 lbs.   ECOG Performance Status: 1 - Symptomatic but completely ambulatory  Medications:  Allergies as of 10/10/2022       Reactions   Bee Venom Swelling   Bee stung her on the inside of her gum. Lips and gums started swelling. No SOB.   Levaquin [levofloxacin] Hives, Itching        Medication List        Accurate as of October 10, 2022  1:53 PM. If you have any questions, ask your nurse or doctor.          amLODipine 5 MG tablet Commonly known as: NORVASC Take 1 tablet (5 mg total) by mouth at bedtime.   cholestyramine 4 g packet Commonly known as: QUESTRAN Take 4 g by mouth once.   denosumab 60 MG/ML Sosy injection Commonly known as: PROLIA Inject 60 mg into the skin every 6 (six) months.   ergocalciferol 1.25 MG (50000 UT) capsule Commonly known as: VITAMIN D2 Take 50,000 Units by mouth every 14 (fourteen) days.   furosemide 20 MG tablet Commonly known as: LASIX Take one by  mouth daily as needed for swelling   HYDROcodone-acetaminophen 5-325 MG tablet Commonly known as: NORCO/VICODIN Take 1 tablet by mouth every 6 (six) hours as needed for moderate pain.   irbesartan 300 MG tablet Commonly known as: AVAPRO Take 1 tablet (300 mg total) by mouth daily.   metoprolol succinate 25 MG 24 hr tablet Commonly known as: TOPROL-XL Take 12.5 mg by mouth in the morning and at bedtime.   omeprazole 40 MG capsule Commonly known as: PRILOSEC Take 1 capsule (40 mg total) by mouth 2 (two) times daily before a meal.   oxybutynin 15 MG 24 hr tablet Commonly known as: DITROPAN XL Take 1 tablet (15 mg total) by mouth at bedtime.   warfarin 5 MG tablet Commonly known as: COUMADIN Take 5 mg by mouth daily. TAKE 1/2 TAB ON MON-WED-FRI        Allergies:  Allergies  Allergen Reactions   Bee Venom Swelling    Bee stung her on the inside of her gum. Lips and gums started swelling. No SOB.   Levaquin [Levofloxacin] Hives and Itching    Past Medical History, Surgical history, Social history, and Family History were reviewed and updated.  Review of Systems: All other 10 point review of systems is negative.   Physical Exam:  vitals were not  taken for this visit.   Wt Readings from Last 3 Encounters:  07/12/22 199 lb 12.8 oz (90.6 kg)  06/09/22 201 lb (91.2 kg)  03/10/22 201 lb (91.2 kg)    Ocular: Sclerae unicteric, pupils equal, round and reactive to light Ear-nose-throat: Oropharynx clear, dentition fair Lymphatic: No cervical or supraclavicular adenopathy Lungs no rales or rhonchi, good excursion bilaterally Heart regular rate and rhythm, no murmur appreciated Abd soft, nontender, positive bowel sounds MSK no focal spinal tenderness, no joint edema Neuro: non-focal, well-oriented, appropriate affect Breasts: Deferred   Lab Results  Component Value Date   WBC 11.1 (H) 10/10/2022   HGB 13.6 10/10/2022   HCT 42.1 10/10/2022   MCV 90.9 10/10/2022   PLT  271 10/10/2022   Lab Results  Component Value Date   FERRITIN 10 (L) 06/09/2022   IRON 42 06/09/2022   TIBC 477 (H) 06/09/2022   UIBC 435 06/09/2022   IRONPCTSAT 9 (L) 06/09/2022   Lab Results  Component Value Date   RETICCTPCT 2.1 10/10/2022   RBC 4.59 10/10/2022   No results found for: "KPAFRELGTCHN", "LAMBDASER", "KAPLAMBRATIO" No results found for: "IGGSERUM", "IGA", "IGMSERUM" No results found for: "TOTALPROTELP", "ALBUMINELP", "A1GS", "A2GS", "BETS", "BETA2SER", "GAMS", "MSPIKE", "SPEI"   Chemistry      Component Value Date/Time   NA 142 07/12/2022 1141   K 3.9 07/12/2022 1141   CL 105 07/12/2022 1141   CO2 30 07/12/2022 1141   BUN 17 07/12/2022 1141   CREATININE 0.68 07/12/2022 1141   CREATININE 0.68 03/10/2022 1011      Component Value Date/Time   CALCIUM 11.1 (H) 07/12/2022 1141   ALKPHOS 52 07/12/2022 1141   AST 25 07/12/2022 1141   AST 20 03/10/2022 1011   ALT 35 07/12/2022 1141   ALT 26 03/10/2022 1011   BILITOT 0.7 07/12/2022 1141   BILITOT 0.5 03/10/2022 1011       Impression and Plan: Loretta Gates is a very pleasant 67 to caucasian female with recent diagnosis of iron deficiency anemia.  Iron studies are pending. We will replace if needed.  Follow-up in 4 months.   Loretta Dawson, NP 1/2/20241:53 PM

## 2022-10-11 LAB — IRON AND IRON BINDING CAPACITY (CC-WL,HP ONLY)
Iron: 50 ug/dL (ref 28–170)
Saturation Ratios: 12 % (ref 10.4–31.8)
TIBC: 433 ug/dL (ref 250–450)
UIBC: 383 ug/dL (ref 148–442)

## 2022-10-12 ENCOUNTER — Telehealth: Payer: Self-pay | Admitting: *Deleted

## 2022-10-12 NOTE — Telephone Encounter (Signed)
Per scheduling message Judson Roch - Called and lvm for a call back to schedule (2) doses of IV Iron.

## 2022-10-16 ENCOUNTER — Inpatient Hospital Stay: Payer: Medicare HMO

## 2022-10-16 VITALS — BP 110/61 | HR 65 | Temp 98.4°F | Resp 17

## 2022-10-16 DIAGNOSIS — D509 Iron deficiency anemia, unspecified: Secondary | ICD-10-CM | POA: Diagnosis not present

## 2022-10-16 MED ORDER — SODIUM CHLORIDE 0.9% FLUSH: 10.0000 mL | Freq: Once | INTRAVENOUS | Status: DC | PRN

## 2022-10-16 MED ORDER — SODIUM CHLORIDE 0.9% FLUSH: 3.0000 mL | Freq: Once | INTRAVENOUS | Status: DC | PRN

## 2022-10-16 MED ORDER — SODIUM CHLORIDE 0.9 % IV SOLN
125.0000 mg | Freq: Once | INTRAVENOUS | Status: AC
  Administered 2022-10-16: 125 mg via INTRAVENOUS
  Filled 2022-10-16: qty 10

## 2022-10-16 MED ORDER — SODIUM CHLORIDE 0.9 % IV SOLN: Freq: Once | INTRAVENOUS | Status: AC

## 2022-10-16 NOTE — Progress Notes (Signed)
Pt declined to stay for post infusion observation period. Pt stated she has tolerated medication multiple times prior without difficulty. Pt aware to call clinic with any questions or concerns. Pt verbalized understanding and had no further questions.  ? ?

## 2022-10-16 NOTE — Patient Instructions (Signed)
Sodium Ferric Gluconate Complex Injection What is this medication? SODIUM FERRIC GLUCONATE COMPLEX (SOE dee um FER ik GLOO koe nate KOM pleks) treats low levels of iron (iron deficiency anemia) in people with kidney disease. Iron is a mineral that plays an important role in making red blood cells, which carry oxygen from your lungs to the rest of your body. This medicine may be used for other purposes; ask your health care provider or pharmacist if you have questions. COMMON BRAND NAME(S): Ferrlecit, Nulecit What should I tell my care team before I take this medication? They need to know if you have any of the following conditions: Anemia that is not from iron deficiency High levels of iron in the blood An unusual or allergic reaction to iron, other medications, foods, dyes, or preservatives Pregnant or are trying to become pregnant Breast-feeding How should I use this medication? This medication is injected into a vein. It is given by your care team in a hospital or clinic setting. Talk to your care team about the use of this medication in children. While it may be prescribed for children as young as 6 years for selected conditions, precautions do apply. Overdosage: If you think you have taken too much of this medicine contact a poison control center or emergency room at once. NOTE: This medicine is only for you. Do not share this medicine with others. What if I miss a dose? It is important not to miss your dose. Call your care team if you are unable to keep an appointment. What may interact with this medication? Do not take this medication with any of the following: Deferasirox Deferoxamine Dimercaprol This medication may also interact with the following: Other iron products This list may not describe all possible interactions. Give your health care provider a list of all the medicines, herbs, non-prescription drugs, or dietary supplements you use. Also tell them if you smoke, drink  alcohol, or use illegal drugs. Some items may interact with your medicine. What should I watch for while using this medication? Your condition will be monitored carefully while you are receiving this medication. Visit your care team for regular checks on your progress. You may need blood work while you are taking this medication. What side effects may I notice from receiving this medication? Side effects that you should report to your care team as soon as possible: Allergic reactions--skin rash, itching, hives, swelling of the face, lips, tongue, or throat Low blood pressure--dizziness, feeling faint or lightheaded, blurry vision Shortness of breath Side effects that usually do not require medical attention (report to your care team if they continue or are bothersome): Flushing Headache Joint pain Muscle pain Nausea Pain, redness, or irritation at injection site This list may not describe all possible side effects. Call your doctor for medical advice about side effects. You may report side effects to FDA at 1-800-FDA-1088. Where should I keep my medication? This medication is given in a hospital or clinic and will not be stored at home. NOTE: This sheet is a summary. It may not cover all possible information. If you have questions about this medicine, talk to your doctor, pharmacist, or health care provider.  2023 Elsevier/Gold Standard (2021-02-18 00:00:00)  

## 2022-10-17 ENCOUNTER — Telehealth: Payer: Self-pay

## 2022-10-17 NOTE — Telephone Encounter (Signed)
Patient called stating she has had severe abdominal cramping today (""felt like labor pains )and had an iron infusion yesterday. Discussed with Gillian Shields who states this is not a side effect of her infusion and could be something unrelated. Advised her to try gasx or tums if occurred again and to report to an urgent care, ED if it is severe. Pt verbalized understanding and declines any other questions or concerns at this time.

## 2022-10-20 ENCOUNTER — Emergency Department (HOSPITAL_BASED_OUTPATIENT_CLINIC_OR_DEPARTMENT_OTHER): Payer: Medicare HMO

## 2022-10-20 ENCOUNTER — Other Ambulatory Visit: Payer: Self-pay

## 2022-10-20 ENCOUNTER — Encounter (HOSPITAL_COMMUNITY): Payer: Self-pay

## 2022-10-20 ENCOUNTER — Inpatient Hospital Stay (HOSPITAL_BASED_OUTPATIENT_CLINIC_OR_DEPARTMENT_OTHER)
Admission: EM | Admit: 2022-10-20 | Discharge: 2022-10-23 | DRG: 291 | Disposition: A | Discharge status: Home / Self Care | Payer: Medicare HMO | Attending: Internal Medicine | Admitting: Internal Medicine

## 2022-10-20 ENCOUNTER — Telehealth: Payer: Self-pay | Admitting: *Deleted

## 2022-10-20 ENCOUNTER — Encounter (HOSPITAL_BASED_OUTPATIENT_CLINIC_OR_DEPARTMENT_OTHER): Payer: Self-pay | Admitting: Emergency Medicine

## 2022-10-20 DIAGNOSIS — D509 Iron deficiency anemia, unspecified: Secondary | ICD-10-CM | POA: Diagnosis not present

## 2022-10-20 DIAGNOSIS — I272 Pulmonary hypertension, unspecified: Secondary | ICD-10-CM | POA: Diagnosis not present

## 2022-10-20 DIAGNOSIS — I509 Heart failure, unspecified: Secondary | ICD-10-CM | POA: Diagnosis not present

## 2022-10-20 DIAGNOSIS — Z9012 Acquired absence of left breast and nipple: Secondary | ICD-10-CM

## 2022-10-20 DIAGNOSIS — Z881 Allergy status to other antibiotic agents status: Secondary | ICD-10-CM | POA: Diagnosis not present

## 2022-10-20 DIAGNOSIS — Z79899 Other long term (current) drug therapy: Secondary | ICD-10-CM

## 2022-10-20 DIAGNOSIS — Z853 Personal history of malignant neoplasm of breast: Secondary | ICD-10-CM | POA: Diagnosis not present

## 2022-10-20 DIAGNOSIS — Z91010 Allergy to peanuts: Secondary | ICD-10-CM

## 2022-10-20 DIAGNOSIS — K219 Gastro-esophageal reflux disease without esophagitis: Secondary | ICD-10-CM | POA: Diagnosis not present

## 2022-10-20 DIAGNOSIS — Z7901 Long term (current) use of anticoagulants: Secondary | ICD-10-CM

## 2022-10-20 DIAGNOSIS — R310 Gross hematuria: Secondary | ICD-10-CM | POA: Diagnosis not present

## 2022-10-20 DIAGNOSIS — Z91030 Bee allergy status: Secondary | ICD-10-CM

## 2022-10-20 DIAGNOSIS — I110 Hypertensive heart disease with heart failure: Principal | ICD-10-CM | POA: Diagnosis present

## 2022-10-20 DIAGNOSIS — R0602 Shortness of breath: Secondary | ICD-10-CM | POA: Diagnosis not present

## 2022-10-20 DIAGNOSIS — Z6841 Body Mass Index (BMI) 40.0 and over, adult: Secondary | ICD-10-CM

## 2022-10-20 DIAGNOSIS — I5033 Acute on chronic diastolic (congestive) heart failure: Secondary | ICD-10-CM | POA: Diagnosis not present

## 2022-10-20 DIAGNOSIS — R791 Abnormal coagulation profile: Secondary | ICD-10-CM | POA: Diagnosis not present

## 2022-10-20 DIAGNOSIS — I10 Essential (primary) hypertension: Secondary | ICD-10-CM | POA: Diagnosis not present

## 2022-10-20 DIAGNOSIS — E890 Postprocedural hypothyroidism: Secondary | ICD-10-CM | POA: Diagnosis not present

## 2022-10-20 DIAGNOSIS — Z9049 Acquired absence of other specified parts of digestive tract: Secondary | ICD-10-CM

## 2022-10-20 DIAGNOSIS — I4891 Unspecified atrial fibrillation: Secondary | ICD-10-CM | POA: Diagnosis not present

## 2022-10-20 DIAGNOSIS — Z1152 Encounter for screening for COVID-19: Secondary | ICD-10-CM

## 2022-10-20 DIAGNOSIS — I4811 Longstanding persistent atrial fibrillation: Secondary | ICD-10-CM | POA: Diagnosis not present

## 2022-10-20 DIAGNOSIS — I480 Paroxysmal atrial fibrillation: Secondary | ICD-10-CM | POA: Diagnosis not present

## 2022-10-20 DIAGNOSIS — R079 Chest pain, unspecified: Secondary | ICD-10-CM | POA: Diagnosis not present

## 2022-10-20 DIAGNOSIS — I5031 Acute diastolic (congestive) heart failure: Secondary | ICD-10-CM | POA: Diagnosis not present

## 2022-10-20 DIAGNOSIS — N3944 Nocturnal enuresis: Secondary | ICD-10-CM | POA: Diagnosis not present

## 2022-10-20 DIAGNOSIS — E66813 Obesity, class 3: Secondary | ICD-10-CM | POA: Diagnosis present

## 2022-10-20 LAB — RESP PANEL BY RT-PCR (RSV, FLU A&B, COVID)  RVPGX2
Influenza A by PCR: NEGATIVE
Influenza B by PCR: NEGATIVE
Resp Syncytial Virus by PCR: NEGATIVE
SARS Coronavirus 2 by RT PCR: NEGATIVE

## 2022-10-20 LAB — BASIC METABOLIC PANEL
Anion gap: 8 (ref 5–15)
BUN: 16 mg/dL (ref 8–23)
CO2: 26 mmol/L (ref 22–32)
Calcium: 9.9 mg/dL (ref 8.9–10.3)
Chloride: 105 mmol/L (ref 98–111)
Creatinine, Ser: 0.78 mg/dL (ref 0.44–1.00)
GFR, Estimated: >60 mL/min (ref >60)
Glucose, Bld: 134 mg/dL — ABNORMAL HIGH (ref 70–99)
Potassium: 3.7 mmol/L (ref 3.5–5.1)
Sodium: 139 mmol/L (ref 135–145)

## 2022-10-20 LAB — CBC WITH DIFFERENTIAL/PLATELET
Abs Immature Granulocytes: 0.06 10*3/uL (ref 0.00–0.07)
Basophils Absolute: 0 10*3/uL (ref 0.0–0.1)
Basophils Relative: 0 %
Eosinophils Absolute: 0.1 10*3/uL (ref 0.0–0.5)
Eosinophils Relative: 1 %
HCT: 39.1 % (ref 36.0–46.0)
Hemoglobin: 12.4 g/dL (ref 12.0–15.0)
Immature Granulocytes: 1 %
Lymphocytes Relative: 14 %
Lymphs Abs: 1.3 10*3/uL (ref 0.7–4.0)
MCH: 28.9 pg (ref 26.0–34.0)
MCHC: 31.7 g/dL (ref 30.0–36.0)
MCV: 91.1 fL (ref 80.0–100.0)
Monocytes Absolute: 0.7 10*3/uL (ref 0.1–1.0)
Monocytes Relative: 7 %
Neutro Abs: 7.1 10*3/uL (ref 1.7–7.7)
Neutrophils Relative %: 77 %
Platelets: 271 10*3/uL (ref 150–400)
RBC: 4.29 MIL/uL (ref 3.87–5.11)
RDW: 14.6 % (ref 11.5–15.5)
WBC: 9.2 10*3/uL (ref 4.0–10.5)
nRBC: 0 % (ref 0.0–0.2)

## 2022-10-20 LAB — COMPREHENSIVE METABOLIC PANEL
ALT: 39 U/L (ref 0–44)
AST: 29 U/L (ref 15–41)
Albumin: 3.8 g/dL (ref 3.5–5.0)
Alkaline Phosphatase: 49 U/L (ref 38–126)
Anion gap: 8 (ref 5–15)
BUN: 17 mg/dL (ref 8–23)
CO2: 26 mmol/L (ref 22–32)
Calcium: 10.1 mg/dL (ref 8.9–10.3)
Chloride: 105 mmol/L (ref 98–111)
Creatinine, Ser: 0.72 mg/dL (ref 0.44–1.00)
GFR, Estimated: >60 mL/min (ref >60)
Glucose, Bld: 117 mg/dL — ABNORMAL HIGH (ref 70–99)
Potassium: 4 mmol/L (ref 3.5–5.1)
Sodium: 139 mmol/L (ref 135–145)
Total Bilirubin: 0.9 mg/dL (ref 0.3–1.2)
Total Protein: 6.9 g/dL (ref 6.5–8.1)

## 2022-10-20 LAB — TROPONIN I (HIGH SENSITIVITY): Troponin I (High Sensitivity): 5 ng/L (ref <18)

## 2022-10-20 LAB — BRAIN NATRIURETIC PEPTIDE: B Natriuretic Peptide: 384 pg/mL — ABNORMAL HIGH (ref 0.0–100.0)

## 2022-10-20 LAB — MAGNESIUM: Magnesium: 2.3 mg/dL (ref 1.7–2.4)

## 2022-10-20 LAB — PROTIME-INR
INR: 3.2 — ABNORMAL HIGH (ref 0.8–1.2)
Prothrombin Time: 32.7 seconds — ABNORMAL HIGH (ref 11.4–15.2)

## 2022-10-20 LAB — POTASSIUM: Potassium: 4.3 mmol/L (ref 3.5–5.1)

## 2022-10-20 MED ORDER — ALBUTEROL SULFATE HFA 108 (90 BASE) MCG/ACT IN AERS: 2.0000 | INHALATION_SPRAY | RESPIRATORY_TRACT | Status: DC | PRN

## 2022-10-20 MED ORDER — METHOCARBAMOL 500 MG PO TABS
500.0000 mg | ORAL_TABLET | Freq: Three times a day (TID) | ORAL | Status: DC | PRN
  Administered 2022-10-20 – 2022-10-21 (×2): 500 mg via ORAL
  Filled 2022-10-20 (×2): qty 1

## 2022-10-20 MED ORDER — FUROSEMIDE 10 MG/ML IJ SOLN
40.0000 mg | Freq: Once | INTRAMUSCULAR | Status: AC
  Administered 2022-10-21: 40 mg via INTRAVENOUS
  Filled 2022-10-20: qty 4

## 2022-10-20 MED ORDER — DILTIAZEM HCL-DEXTROSE 125-5 MG/125ML-% IV SOLN (PREMIX)
5.0000 mg/h | INTRAVENOUS | Status: DC
  Administered 2022-10-20: 5 mg/h via INTRAVENOUS
  Administered 2022-10-21: 7.5 mg/h via INTRAVENOUS
  Filled 2022-10-20 (×2): qty 125

## 2022-10-20 MED ORDER — FUROSEMIDE 10 MG/ML IJ SOLN
40.0000 mg | Freq: Once | INTRAMUSCULAR | Status: AC
  Administered 2022-10-20: 40 mg via INTRAVENOUS
  Filled 2022-10-20: qty 4

## 2022-10-20 MED ORDER — POLYETHYLENE GLYCOL 3350 17 G PO PACK: 17.0000 g | PACK | Freq: Every day | ORAL | Status: DC | PRN

## 2022-10-20 MED ORDER — METHOCARBAMOL 1000 MG/10ML IJ SOLN
250.0000 mg | Freq: Once | INTRAMUSCULAR | Status: AC
  Administered 2022-10-20: 250 mg via INTRAVENOUS
  Filled 2022-10-20: qty 10

## 2022-10-20 MED ORDER — OXYBUTYNIN CHLORIDE ER 5 MG PO TB24
15.0000 mg | ORAL_TABLET | Freq: Every day | ORAL | Status: DC
  Administered 2022-10-20 – 2022-10-22 (×3): 15 mg via ORAL
  Filled 2022-10-20 (×4): qty 1

## 2022-10-20 MED ORDER — CHOLESTYRAMINE 4 G PO PACK
4.0000 g | PACK | ORAL | Status: DC
  Administered 2022-10-20 – 2022-10-22 (×3): 4 g via ORAL
  Filled 2022-10-20 (×5): qty 1

## 2022-10-20 MED ORDER — POTASSIUM CHLORIDE CRYS ER 20 MEQ PO TBCR
40.0000 meq | EXTENDED_RELEASE_TABLET | Freq: Once | ORAL | Status: AC
  Administered 2022-10-20: 40 meq via ORAL
  Filled 2022-10-20: qty 2

## 2022-10-20 MED ORDER — POTASSIUM CHLORIDE CRYS ER 20 MEQ PO TBCR
20.0000 meq | EXTENDED_RELEASE_TABLET | Freq: Once | ORAL | Status: AC
  Administered 2022-10-21: 20 meq via ORAL
  Filled 2022-10-20: qty 1

## 2022-10-20 MED ORDER — HYDROCODONE-ACETAMINOPHEN 5-325 MG PO TABS: 1.0000 | ORAL_TABLET | Freq: Four times a day (QID) | ORAL | Status: DC | PRN

## 2022-10-20 MED ORDER — METOPROLOL TARTRATE 50 MG PO TABS
50.0000 mg | ORAL_TABLET | Freq: Two times a day (BID) | ORAL | Status: DC
  Administered 2022-10-20 – 2022-10-23 (×7): 50 mg via ORAL
  Filled 2022-10-20 (×5): qty 1
  Filled 2022-10-20: qty 2
  Filled 2022-10-20: qty 1

## 2022-10-20 MED ORDER — ACETAMINOPHEN 325 MG PO TABS
650.0000 mg | ORAL_TABLET | Freq: Four times a day (QID) | ORAL | Status: DC | PRN
  Administered 2022-10-21: 650 mg via ORAL
  Filled 2022-10-20: qty 2

## 2022-10-20 MED ORDER — HYDROCODONE-ACETAMINOPHEN 5-325 MG PO TABS
1.0000 | ORAL_TABLET | Freq: Four times a day (QID) | ORAL | Status: DC | PRN
  Administered 2022-10-20 – 2022-10-21 (×2): 1 via ORAL
  Filled 2022-10-20 (×2): qty 1

## 2022-10-20 MED ORDER — PANTOPRAZOLE SODIUM 40 MG PO TBEC
40.0000 mg | DELAYED_RELEASE_TABLET | Freq: Every day | ORAL | Status: DC
  Administered 2022-10-21 – 2022-10-23 (×3): 40 mg via ORAL
  Filled 2022-10-20 (×3): qty 1

## 2022-10-20 MED ORDER — MELATONIN 3 MG PO TABS
3.0000 mg | ORAL_TABLET | Freq: Every evening | ORAL | Status: DC | PRN
  Administered 2022-10-22: 3 mg via ORAL
  Filled 2022-10-20: qty 1

## 2022-10-20 MED ORDER — WARFARIN - PHARMACIST DOSING INPATIENT
Freq: Every day | Status: DC
  Filled 2022-10-20: qty 1

## 2022-10-20 MED ORDER — PROCHLORPERAZINE EDISYLATE 10 MG/2ML IJ SOLN: 5.0000 mg | Freq: Four times a day (QID) | INTRAMUSCULAR | Status: DC | PRN

## 2022-10-20 MED ORDER — DILTIAZEM LOAD VIA INFUSION
20.0000 mg | Freq: Once | INTRAVENOUS | Status: AC
  Administered 2022-10-20: 20 mg via INTRAVENOUS
  Filled 2022-10-20: qty 20

## 2022-10-20 NOTE — Telephone Encounter (Signed)
This nurse returned patient's phone call this morning. Husband answered and stated,"thank you for calling us back. We are headed your way to the Emergency Room. Husband put patient on the phone and she was thankful for the call and advice." Liz Beach

## 2022-10-20 NOTE — H&P (Signed)
History and Physical  Loretta Gates NID:[id number] DOB: 21-Sep-1936 DOA: 10/20/2022  Referring physician: Direct transfer from [facility] ED, accepted by Dr. Roosevelt Locks, [facility], hospitalist   PCP: Darreld Mclean, MD  Outpatient Specialists: Cardiology. Patient coming from: Home through [facility] ED.  Chief Complaint: Shortness of breath  HPI: Loretta Gates is a 87 y.o. female with medical history significant for paroxysmal A-fib on Coumadin, iron deficiency anemia on iron infusion, essential hypertension, GERD, who initially presented to [facility] ED due to progressive dyspnea with minimal exertion.  States that since she received her IV iron infusion on Monday, 5 days ago, she started feeling distended in her abdomen with associative bilateral symmetrical lower extremity edema.  Due to worsening shortness of breath and significantly elevated home blood pressures, her family insisted that she comes to the ED for further evaluation.  In the ED, she was noted to be in A-fib with RVR and was started on Cardizem drip.  Chest x-ray was notable for cardiomegaly and minimal bilateral pleural effusions.  Lab studies were remarkable for elevated BNP greater than 300, high-sensitivity troponin 5.  Potassium and magnesium levels are within normal range.  She received IV Lasix 40 mg x 1 in the ED.  Admitted by Dr. Roosevelt Locks, [facility], hospitalist service, and transferred to [facility] progressive care unit for further management.  At the time of this visit, the patient's dyspnea was improved after 1 dose of IV diuresis.  An additional 40 mg IV Lasix has been provided.  ED Course: Tmax 98.2.  BP 130/74, pulse 73, respiratory 18, saturation 94% on 2 L.  Lab studies significant for potassium 4.3, magnesium 2.3.  BUN 60, creatinine 0.78.  Glucose 134.  GFR greater than 60.  BNP 384.  Troponin 5.  CBC essentially unremarkable.  Review of Systems: Review of systems as noted in the HPI. All other systems reviewed and are  negative.   Past Medical History:  Diagnosis Date   Anemia    Atrial fibrillation ([place])    on Coumadin   Breast mass, right    Cancer (HCC)    LEFT BREAST-Mastectomy   GERD (gastroesophageal reflux disease)    Hypertension    OAB (overactive bladder)    PONV (postoperative nausea and vomiting)    Past Surgical History:  Procedure Laterality Date   BREAST LUMPECTOMY WITH RADIOACTIVE SEED LOCALIZATION Right 10/25/2021   Procedure: RIGHT BREAST LUMPECTOMY WITH RADIOACTIVE SEED LOCALIZATION;  Surgeon: Erroll Luna, MD;  Location: [facility];  Service: General;  Laterality: Right;   CHOLECYSTECTOMY     MASTECTOMY Left 2010   OVARIAN CYST REMOVAL     PARATHYROIDECTOMY      Social History:  reports that she has never smoked. She has never used smokeless tobacco. She reports that she does not drink alcohol and does not use drugs.   Allergies  Allergen Reactions   Bee Venom Swelling    Bee stung her on the inside of her gum. Lips and gums started swelling. No SOB.   Levaquin [Levofloxacin] Hives and Itching   Peanut-Containing Drug Products Other (See Comments)    Stomach pain and diarrhea     Family history: None provided.  Prior to Admission medications   Medication Sig Start Date End Date Taking? Authorizing Provider  amLODipine (NORVASC) 5 MG tablet Take 1 tablet (5 mg total) by mouth at bedtime. 05/29/22  Yes Copland, Gay Filler, MD  cholestyramine Lucrezia Starch) 4 GM/DOSE powder Take 4 g by mouth See  admin instructions. Mix 1 teaspoon in water and take at 6pm daily with warfarin.   Yes [provider]  denosumab (PROLIA) 60 MG/ML SOSY injection Inject 60 mg into the skin every 6 (six) months.   Yes [provider]  ergocalciferol (VITAMIN D2) 1.25 MG (50000 UT) capsule Take 50,000 Units by mouth every 14 (fourteen) days. Every other saturday   Yes [provider]  furosemide (LASIX) 20 MG tablet Take one by mouth daily as needed for  swelling Patient taking differently: Take 20 mg by mouth as needed (swelling). 01/27/22  Yes Copland, Gay Filler, MD  irbesartan (AVAPRO) 300 MG tablet Take 1 tablet (300 mg total) by mouth daily. 05/29/22  Yes Copland, Gay Filler, MD  magnesium oxide (MAG-OX) 400 (240 Mg) MG tablet Take 400 mg by mouth daily.   Yes [provider]  metoprolol succinate (TOPROL-XL) 25 MG 24 hr tablet Take 25 mg by mouth daily. 07/19/22  Yes [provider]  omeprazole (PRILOSEC) 40 MG capsule Take 1 capsule (40 mg total) by mouth 2 (two) times daily before a meal. 05/29/22  Yes Copland, Gay Filler, MD  oxybutynin (DITROPAN XL) 15 MG 24 hr tablet Take 1 tablet (15 mg total) by mouth at bedtime. 05/05/22  Yes Copland, Gay Filler, MD  warfarin (COUMADIN) 5 MG tablet Take 2.5-5 mg by mouth See admin instructions. Take 0.5 tablets (2.5 mg total) by mouth daily at 6pm except 1 tablet (5 mg total) on Mondays and Wednesdays. 07/26/22 10/24/22 Yes [provider]  HYDROcodone-acetaminophen (NORCO/VICODIN) 5-325 MG tablet Take 1 tablet by mouth every 6 (six) hours as needed for moderate pain. Patient not taking: Reported on 10/20/2022 10/25/21   Erroll Luna, MD    Physical Exam: BP 130/74 (BP Location: Right Arm)   Pulse 73   Temp 97.9 F (36.6 C) (Oral)   Resp 18   Ht '4\' 10"'$  (1.473 m)   Wt 95.7 kg   SpO2 94%   BMI 44.10 kg/m   General: 87 y.o. year-old female well developed well nourished in no acute distress.  Alert and oriented x3. Cardiovascular: Regular rate and rhythm with no rubs or gallops.  No thyromegaly or JVD noted.  2+ pitting edema in lower extremity bilaterally. 2/4 pulses in all 4 extremities. Respiratory: Mild rales at bases with no wheezing noted. Good inspiratory effort. Abdomen: Obese, mildly distended nondistended with normal bowel sounds x4 quadrants. Muskuloskeletal: No cyanosis or clubbing.  2+ pitting edema noted bilaterally in lower extremities. Neuro: CN II-XII  intact, strength, sensation, reflexes Skin: No ulcerative lesions noted or rashes Psychiatry: Judgement and insight appear normal. Mood is appropriate for condition and setting          Labs on Admission:  Basic Metabolic Panel: Recent Labs  Lab 10/20/22 0959 10/20/22 1301 10/20/22 1358  NA 139 139  --   K 4.0 3.7  --   CL 105 105  --   CO2 26 26  --   GLUCOSE 117* 134*  --   BUN 17 16  --   CREATININE 0.72 0.78  --   CALCIUM 10.1 9.9  --   MG  --   --  2.3   Liver Function Tests: Recent Labs  Lab 10/20/22 0959  AST 29  ALT 39  ALKPHOS 49  BILITOT 0.9  PROT 6.9  ALBUMIN 3.8   No results for input(s): "LIPASE", "AMYLASE" in the last 168 hours. No results for input(s): "AMMONIA" in the last 168  hours. CBC: Recent Labs  Lab 10/20/22 0959  WBC 9.2  NEUTROABS 7.1  HGB 12.4  HCT 39.1  MCV 91.1  PLT 271   Cardiac Enzymes: No results for input(s): "CKTOTAL", "CKMB", "CKMBINDEX", "TROPONINI" in the last 168 hours.  BNP (last 3 results) Recent Labs    10/20/22 0959  BNP 384.0*    ProBNP (last 3 results) No results for input(s): "PROBNP" in the last 8760 hours.  CBG: No results for input(s): "GLUCAP" in the last 168 hours.  Radiological Exams on Admission: DG Chest Port 1 View  Result Date: 10/20/2022 CLINICAL DATA:  Shortness of breath EXAM: PORTABLE CHEST 1 VIEW COMPARISON:  Previous studies including the examination done earlier today FINDINGS: Transverse diameter of heart is increased. Central pulmonary vessels are prominent. There is slight decrease in interstitial markings there is minimal blunting of lateral CP angles. There is no pneumothorax. Surgical clips are seen in left thyroid bed. IMPRESSION: Cardiomegaly. There is decrease in interstitial markings in both lungs suggesting possible resolving interstitial edema. There are no new focal infiltrates. Blunting of costophrenic angles may suggest minimal pleural effusions Electronically Signed   By:  Elmer Picker M.D.   On: 10/20/2022 13:25   DG Chest Port 1 View  Result Date: 10/20/2022 CLINICAL DATA:  [id number] SOB (shortness of breath) [id number] EXAM: PORTABLE CHEST - 1 VIEW COMPARISON:  12/22/2020 FINDINGS: Cardiac silhouette is prominent. There is pulmonary interstitial prominence with vascular congestion. No focal consolidation. No pneumothorax or pleural effusion identified. IMPRESSION: Findings suggest CHF. Electronically Signed   By: Sammie Bench M.D.   On: 10/20/2022 09:54    EKG: I independently viewed the EKG done and my findings are as followed: Atrial fibrillation rate of 123.  Nonspecific ST-T changes.  QTc 462.  Assessment/Plan Present on Admission:  A-fib [facility])  Principal Problem:   A-fib ([place]) Active Problems:   Acute on chronic diastolic CHF (congestive heart failure) (HCC)  Acute on chronic diastolic CHF Presented with BNP greater than 300 and peripheral edema Received IV diuresis in the ED, IV Lasix 40 mg x 1 Provided another dose of IV Lasix 40 mg x 1. Continue home Lopressor. Start strict I's and O's and daily weight Follow 2D echo  Paroxysmal A-fib with RVR, rate has improved. Initially on Cardizem drip, improved Also on p.o. Lopressor 50 mg twice daily, continue Closely monitor on telemetry. On Coumadin for CVA prevention with supratherapeutic level, pharmacy consulted to assist with the management of Coumadin, appreciate assistance.  GERD Stable Resume home Protonix and Questran.  Essential hypertension BPs are currently soft Closely monitor vital signs  Supratherapeutic INR INR 3.2 Hold Coumadin tonight Managed by pharmacy, appreciate assistance.  Iron deficiency anemia Last iron infusion on Monday, 5 days ago. Hemoglobin is stable and at baseline. No reported overt bleeding.  Severe morbid obesity BMI 44 Recommend weight loss outpatient with regular physical activity and healthy dieting.    Critical care time: 55  minutes.    DVT prophylaxis: Supratherapeutic on Coumadin  Code Status: Full code  Family Communication: Updated the patient's daughter at bedside.  Disposition Plan: Admitted to progressive care unit  Consults called: Consult cardiology in the morning.  Admission status: Inpatient status.   Status is: Inpatient The patient requires at least 2 midnights for further evaluation and treatment of present condition.   Kayleen Memos MD [facility] Pager [phone]  If 7PM-7AM, please contact night-coverage [email or web address] Password [facility]  10/20/2022, 9:34 PM

## 2022-10-20 NOTE — ED Notes (Signed)
Report given to [place] via phone.

## 2022-10-20 NOTE — ED Provider Notes (Signed)
Loretta Gates   CSN: [id number] Arrival date & time: 10/20/22  8657     History  Chief Complaint  Patient presents with   Shortness of Breath    Loretta Gates is a 87 y.o. female.  HPI Patient reports she has had creasing shortness of breath since she had an iron transfusion 5 days ago.  She reports she called several times to ask the office about what to do.  She reports the symptoms have just gotten worse.  She has gotten swelling in her feet.  She reports typically she gets some swelling in her feet during the summertime and has Lasix to take as needed but reports that she does not frequently get swelling of the feet and lower legs and does not take Lasix with any regularity.  She also feels that her abdomen has become distended.  No chest pain but perception of shortness of breath and difficulty breathing.  She reports since getting her iron transfusion she has sleeping propped up on pillows at night which is helping.  No fevers or productive cough.  Patient has known history of atrial fibrillation.  She is chronically anticoagulated on Coumadin.  Patient is unsure if she is predominantly in A-fib rate controlled or in sinus rhythm.  She reports she has been told in the past that she had a choice about taking the Coumadin or not.  She has always continued it due to fear of a stroke.  Reports she is compliant with it.    Home Medications Prior to Admission medications   Medication Sig Start Date End Date Taking? Authorizing Provider  amLODipine (NORVASC) 5 MG tablet Take 1 tablet (5 mg total) by mouth at bedtime. 05/29/22   Copland, Gay Filler, MD  cholestyramine (QUESTRAN) 4 g packet Take 4 g by mouth once.    [provider]  denosumab (PROLIA) 60 MG/ML SOSY injection Inject 60 mg into the skin every 6 (six) months.    [provider]  ergocalciferol (VITAMIN D2) 1.25 MG (50000 UT) capsule Take 50,000 Units by mouth every  14 (fourteen) days.    [provider]  furosemide (LASIX) 20 MG tablet Take one by mouth daily as needed for swelling 01/27/22   Copland, Gay Filler, MD  HYDROcodone-acetaminophen (NORCO/VICODIN) 5-325 MG tablet Take 1 tablet by mouth every 6 (six) hours as needed for moderate pain. 10/25/21   Cornett, Marcello Moores, MD  irbesartan (AVAPRO) 300 MG tablet Take 1 tablet (300 mg total) by mouth daily. 05/29/22   Copland, Gay Filler, MD  metoprolol succinate (TOPROL-XL) 25 MG 24 hr tablet Take 12.5 mg by mouth in the morning and at bedtime.    [provider]  omeprazole (PRILOSEC) 40 MG capsule Take 1 capsule (40 mg total) by mouth 2 (two) times daily before a meal. 05/29/22   Copland, Gay Filler, MD  oxybutynin (DITROPAN XL) 15 MG 24 hr tablet Take 1 tablet (15 mg total) by mouth at bedtime. 05/05/22   Copland, Gay Filler, MD  warfarin (COUMADIN) 5 MG tablet Take 5 mg by mouth daily. TAKE 1/2 TAB ON MON-WED-FRI    [provider]      Allergies    Bee venom and Levaquin [levofloxacin]    Review of Systems   Review of Systems  Physical Exam Updated Vital Signs BP (!) 137/100   Pulse 76   Temp 97.9 F (36.6 C)   Resp (!) 25   Ht '4\' 10"'$  (1.473  m)   Wt 95.7 kg   SpO2 99%   BMI 44.10 kg/m  Physical Exam Constitutional:      Comments: Alert nontoxic.  Clear mental status.  Mild increased work of breathing at rest.  HENT:     Mouth/Throat:     Pharynx: Oropharynx is clear.  Eyes:     Extraocular Movements: Extraocular movements intact.  Cardiovascular:     Rate and Rhythm: Tachycardia present. Rhythm irregular.  Pulmonary:     Comments: Tachypnea with mild increased work of breathing at rest.  Crackles bilateral lung fields.  Occasional wheeze. Abdominal:     Comments: Abdomen is moderately distended and central obesity.  Nontender.  Musculoskeletal:     Comments: 2-3+ pitting edema bilateral lower extremities.  Feet are symmetric.  No active wounds or cellulitis.   Skin:    General: Skin is warm and dry.  Neurological:     General: No focal deficit present.     Mental Status: She is oriented to person, place, and time.     Cranial Nerves: No cranial nerve deficit.     Motor: No weakness.     Coordination: Coordination normal.  Psychiatric:        Mood and Affect: Mood normal.     ED Results / Procedures / Treatments   Labs (all labs ordered are listed, but only abnormal results are displayed) Labs Reviewed  PROTIME-INR - Abnormal; Notable for the following components:      Result Value   Prothrombin Time 32.7 (*)    INR 3.2 (*)    All other components within normal limits  RESP PANEL BY RT-PCR (RSV, FLU A&B, COVID)  RVPGX2  CBC WITH DIFFERENTIAL/PLATELET  COMPREHENSIVE METABOLIC PANEL  BRAIN NATRIURETIC PEPTIDE  TROPONIN I (HIGH SENSITIVITY)    EKG EKG Interpretation  Date/Time:  Friday October 20 2022 09:42:33 EST Ventricular Rate:  123 PR Interval:    QRS Duration: 75 QT Interval:  323 QTC Calculation: 462 R Axis:   50 Text Interpretation: Atrial fibrillation Ventricular premature complex Borderline repolarization abnormality afib new since previous tracing. no acute ischemic appearance Confirmed by Charlesetta Shanks [phone]) on 10/20/2022 9:50:38 AM  Radiology DG Chest Port 1 View  Result Date: 10/20/2022 CLINICAL DATA:  141880 SOB (shortness of breath) 141880 EXAM: PORTABLE CHEST - 1 VIEW COMPARISON:  12/22/2020 FINDINGS: Cardiac silhouette is prominent. There is pulmonary interstitial prominence with vascular congestion. No focal consolidation. No pneumothorax or pleural effusion identified. IMPRESSION: Findings suggest CHF. Electronically Signed   By: Sammie Bench M.D.   On: 10/20/2022 09:54    Procedures Procedures   CRITICAL CARE Performed by: Charlesetta Shanks   Total critical care time: 30 minutes  Critical care time was exclusive of separately billable procedures and treating other patients.  Critical care was  necessary to treat or prevent imminent or life-threatening deterioration.  Critical care was time spent personally by me on the following activities: development of treatment plan with patient and/or surrogate as well as nursing, discussions with consultants, evaluation of patient's response to treatment, examination of patient, obtaining history from patient or surrogate, ordering and performing treatments and interventions, ordering and review of laboratory studies, ordering and review of radiographic studies, pulse oximetry and re-evaluation of patient's condition.  Medications Ordered in ED Medications  albuterol (VENTOLIN HFA) 108 (90 Base) MCG/ACT inhaler 2 puff (has no administration in time range)  diltiazem (CARDIZEM) 1 mg/mL load via infusion 20 mg (has no administration in time range)  And  diltiazem (CARDIZEM) 125 mg in dextrose 5% 125 mL (1 mg/mL) infusion (has no administration in time range)  furosemide (LASIX) injection 40 mg (has no administration in time range)    ED Course/ Medical Decision Making/ A&P                           Medical Decision Making Amount and/or Complexity of Data Reviewed Labs: ordered. Radiology: ordered.  Risk Prescription drug management. Decision regarding hospitalization.   Patient presents as outlined.  At this point she appears volume overloaded.  She has crackles on exam.  She has peripheral edema.  Patient had iron transfusion before onset of symptoms.  He has known history of atrial fibrillation.  At this time patient is in rapid atrial fibrillation with signs of congestive heart failure.  She denies recent fever or productive cough.  Lower suspicion for pneumonia or infectious source.  I have personally reviewed the patient's chest x-ray and reviewed radiology over read.  Patient does have findings consistent with vascular congestion.  No focal consolidations.  I evaluated the patient's rhythm strip on monitor multiple times.  She is  consistently atrial fibrillation rates from 120s to 160s.  At thime we will proceed with a dose of Cardizem IV bolus and drip as well as diuresis with Lasix.  Patient does not have any chest pain.  Patient's INR is therapeutic at 3.2.  Recheck: 12: 45 patient feels subjectively improved.  Heart rates are ranging from 80s to 120s now.  She has diuresed 2 L by nursing report.  Will increase Cardizem rate.  No chest pain or signs of distress.  Diagnostic evaluation plus history and exam are most consistent with acute CHF exacerbation secondary to rapid atrial fibrillation.  Patient will require admission for ongoing management of rate control and diuresis.  Consult: Reviewed Dr. Roosevelt Locks for admission.        Final Clinical Impression(s) / ED Diagnoses Final diagnoses:  Atrial fibrillation with rapid ventricular response ([place])  Acute on chronic congestive heart failure, unspecified heart failure type [facility])    Rx / DC Orders ED Discharge Orders     None         Charlesetta Shanks, MD 10/20/22 1302

## 2022-10-20 NOTE — ED Notes (Signed)
HR at 99-105/min after bolus IV initiated. Afib, NBP 119/67

## 2022-10-20 NOTE — ED Notes (Signed)
Cardizem gtt and bolus initiated per ED MD orders, Afib with RVR noted on monitor with rates from 130s to 150s. NBP 110-168mHg SBP, GCS 15

## 2022-10-20 NOTE — ED Notes (Signed)
Pt having cramping in both of her calves and L leg. Hx of same.

## 2022-10-20 NOTE — ED Triage Notes (Signed)
Shortness of breath this morning , nasal congestion .  Audible wheezing during triage . RT at bedside . Reports iron infusion 5 days ago .  Low O2 sat .

## 2022-10-20 NOTE — ED Notes (Signed)
Called lab for Mg addon.

## 2022-10-20 NOTE — ED Notes (Signed)
IV removed.

## 2022-10-20 NOTE — Progress Notes (Signed)
ANTICOAGULATION CONSULT NOTE - Initial Consult  Pharmacy Consult for warfarin Indication: atrial fibrillation  Allergies  Allergen Reactions   Bee Venom Swelling    Bee stung her on the inside of her gum. Lips and gums started swelling. No SOB.   Levaquin [Levofloxacin] Hives and Itching   Peanut-Containing Drug Products Other (See Comments)    Stomach pain and diarrhea     Patient Measurements: Height: '4\' 10"'$  (147.3 cm) Weight: 95.7 kg (211 lb) IBW/kg (Calculated) : 40.9  Vital Signs: Temp: 97.9 F (36.6 C) (01/12 1130) BP: 131/66 (01/12 1330) Pulse Rate: 121 (01/12 1330)  Labs: Recent Labs    10/20/22 0959 10/20/22 1301  HGB 12.4  --   HCT 39.1  --   PLT 271  --   LABPROT 32.7*  --   INR 3.2*  --   CREATININE 0.72 0.78  TROPONINIHS 5  --     Estimated Creatinine Clearance: 51.9 mL/min (by C-G formula based on SCr of 0.78 mg/dL).   Medical History: Past Medical History:  Diagnosis Date   Anemia    Atrial fibrillation (HCC)    on Coumadin   Breast mass, right    Cancer (HCC)    LEFT BREAST-Mastectomy   GERD (gastroesophageal reflux disease)    Hypertension    OAB (overactive bladder)    PONV (postoperative nausea and vomiting)     Assessment: 67 yof presented to the ED with SOB. She is chronically anticoagulated with warfarin for history of afib. INR is supratherapeutic at 3.2. CBC is WNL. No bleeding noted.   PTA warfarin regimen: 2.'5mg'$  daily except '5mg'$  on Mon + Wed  Goal of Therapy:  INR 2-3 Monitor platelets by anticoagulation protocol: Yes   Plan:  No warfarin today Daily INR  Cabe Lashley, Rande Lawman 10/20/2022,1:43 PM

## 2022-10-21 ENCOUNTER — Inpatient Hospital Stay (HOSPITAL_COMMUNITY): Payer: Medicare HMO

## 2022-10-21 DIAGNOSIS — K219 Gastro-esophageal reflux disease without esophagitis: Secondary | ICD-10-CM | POA: Diagnosis not present

## 2022-10-21 DIAGNOSIS — I10 Essential (primary) hypertension: Secondary | ICD-10-CM

## 2022-10-21 DIAGNOSIS — I5031 Acute diastolic (congestive) heart failure: Secondary | ICD-10-CM

## 2022-10-21 DIAGNOSIS — I5033 Acute on chronic diastolic (congestive) heart failure: Secondary | ICD-10-CM | POA: Diagnosis not present

## 2022-10-21 DIAGNOSIS — I4811 Longstanding persistent atrial fibrillation: Secondary | ICD-10-CM | POA: Diagnosis not present

## 2022-10-21 DIAGNOSIS — D509 Iron deficiency anemia, unspecified: Secondary | ICD-10-CM

## 2022-10-21 LAB — BASIC METABOLIC PANEL
Anion gap: 6 (ref 5–15)
BUN: 16 mg/dL (ref 8–23)
CO2: 26 mmol/L (ref 22–32)
Calcium: 10.1 mg/dL (ref 8.9–10.3)
Chloride: 107 mmol/L (ref 98–111)
Creatinine, Ser: 0.77 mg/dL (ref 0.44–1.00)
GFR, Estimated: >60 mL/min (ref >60)
Glucose, Bld: 111 mg/dL — ABNORMAL HIGH (ref 70–99)
Potassium: 4 mmol/L (ref 3.5–5.1)
Sodium: 139 mmol/L (ref 135–145)

## 2022-10-21 LAB — ECHOCARDIOGRAM COMPLETE
Area-P 1/2: 3.31 cm2
Height: 58 in
MV M vel: 3.22 m/s
MV Peak grad: 41.5 mmHg
S' Lateral: 2.3 cm
Weight: 3376 oz

## 2022-10-21 LAB — CBC
HCT: 36.5 % (ref 36.0–46.0)
Hemoglobin: 11.6 g/dL — ABNORMAL LOW (ref 12.0–15.0)
MCH: 29.1 pg (ref 26.0–34.0)
MCHC: 31.8 g/dL (ref 30.0–36.0)
MCV: 91.5 fL (ref 80.0–100.0)
Platelets: 247 10*3/uL (ref 150–400)
RBC: 3.99 MIL/uL (ref 3.87–5.11)
RDW: 14.6 % (ref 11.5–15.5)
WBC: 10.2 10*3/uL (ref 4.0–10.5)
nRBC: 0 % (ref 0.0–0.2)

## 2022-10-21 LAB — MAGNESIUM: Magnesium: 2.4 mg/dL (ref 1.7–2.4)

## 2022-10-21 LAB — PROTIME-INR
INR: 3 — ABNORMAL HIGH (ref 0.8–1.2)
Prothrombin Time: 31.1 seconds — ABNORMAL HIGH (ref 11.4–15.2)

## 2022-10-21 MED ORDER — FUROSEMIDE 10 MG/ML IJ SOLN
60.0000 mg | Freq: Two times a day (BID) | INTRAMUSCULAR | Status: DC
  Administered 2022-10-21 – 2022-10-22 (×2): 60 mg via INTRAVENOUS
  Filled 2022-10-21 (×2): qty 6

## 2022-10-21 MED ORDER — WARFARIN SODIUM 2.5 MG PO TABS
2.5000 mg | ORAL_TABLET | Freq: Once | ORAL | Status: DC
  Filled 2022-10-21: qty 1

## 2022-10-21 MED ORDER — PERFLUTREN LIPID MICROSPHERE
1.0000 mL | INTRAVENOUS | Status: AC | PRN
  Administered 2022-10-21: 2 mL via INTRAVENOUS

## 2022-10-21 MED ORDER — EMPAGLIFLOZIN 10 MG PO TABS
10.0000 mg | ORAL_TABLET | Freq: Every day | ORAL | Status: DC
  Administered 2022-10-21 – 2022-10-23 (×3): 10 mg via ORAL
  Filled 2022-10-21 (×3): qty 1

## 2022-10-21 MED ORDER — GERHARDT'S BUTT CREAM
TOPICAL_CREAM | Freq: Two times a day (BID) | CUTANEOUS | Status: DC
  Filled 2022-10-21: qty 1

## 2022-10-21 NOTE — Assessment & Plan Note (Signed)
Echocardiogram preserved LV systolic function with EF 55 to 16%, RV systolic function preserved. RV size with mild enlargement. Mild to moderate TR. RVSP 52,2   Acute on chronic core pulmonale Pulmonary hypertension.   Urine output 5,650 ml. Systolic blood pressure 109 to 140 mmHg. .   Improved volume status and will transition to oral furosemide in am.  Continue with SGLT 2 inh and add spironolactone.  Patient at home on irbesartan.  Continue with metoprolol increased dose to 50 mg po bid to better rate control atrial fibrillation.   Continue Kcl to prevent hypokalemia.

## 2022-10-21 NOTE — Progress Notes (Signed)
  Progress Note   Patient: Loretta Gates OHY:[id number] DOB: 01-12-1936 DOA: 10/20/2022     1 DOS: the patient was seen and examined on 10/21/2022   Brief hospital course: Mrs. Gentles was admitted to the hospital with the working diagnosis of decompensated heart failure.   87 yo female with the past medical history of paroxysmal atrial fibrillation, hypertension, and obesity who presented with dyspnea. Reported 5 days of worsening dyspnea along with lower extremity edema. Because persistent symptoms and uncontrolled hypertension her family brought her to the ED. On her initial physical examination her blood pressure was 130/74, HR 73, RR 19 and 02 saturation 94%, heart with S1 and S2 present and rhythmic, respiratory with rales but not wheezing, abdomen with no distention, positive lower extremity edema.   Na 139, K 4,0 Cl 105 bicarbonate 26 glucose 117 bun 17 cr 0,72  BNP 384  High sensitive troponin 5  Wbc 9,3 hgb 12.4 plt 271  INR 3,2  Sars covid 19 negative   Chest radiograph with mild cardiomegaly, bilateral hilar vascular congestion, fluid in the right fissure.   EKG 123 bpm, normal axis, normal qtc, atrial fibrillation rhythm with no significant ST segment or T wave changes.   Patient was placed on furosemide for diuresis.  Diltiazem for rate control atrial fibrillation.    Assessment and Plan: * Acute on chronic diastolic CHF (congestive heart failure) (HCC) Echocardiogram is pending  Urine output is 9,485 ml Systolic blood pressure 462 to 117.   Plan to continue diuresis with furosemide 60 mg IV q12  Add SGLT 2 inh, if good toleration will add spironolactone.  Patient at home on irbesartan.  Continue with metoprolol.   A-fib (HCC) Rate control atrial fibrillation, with metoprolol. Patient would like to transition to apixaban, will discontinue warfarin.  Bridge per pharmacy protocol.  Continue telemetry monitoring.   Essential hypertension Continue blood pressure  monitoring Will resume ARB after diuresis.  Continue metoprolol.   IDA (iron deficiency anemia) Patient getting IV iron as outpatient.   GERD (gastroesophageal reflux disease) Continue with pantoprazole.   Class 3 obesity (HCC) Calculated BMI is 44.1         Subjective: Patient is feeling better but not back to her baseline, continue to have edema lower extremities and dyspnea not yet resolved.   Physical Exam: Vitals:   10/21/22 0506 10/21/22 0854 10/21/22 0855 10/21/22 1051  BP: (!) 126/106 121/76  (!) 127/96  Pulse: 78 (!) 101  84  Resp: 20 (!) '21 20 15  '$ Temp:  97.6 F (36.4 C)  97.8 F (36.6 C)  TempSrc:  Oral  Oral  SpO2: 92% 90%  92%  Weight:      Height:       Neurology awake and alert ENT with mild pallor Cardiovascular with S1 and S2 present, irregularly irregular with no gallops, rubs or murmurs Respiratory with expiratory wheezing and bilateral rales, distant breath sounds and prolonged expiratory phase.  Abdomen with no distention  Positive lower extremity edema ++  Data Reviewed:    Family Communication: I spoke with patient's daughter at the bedside, we talked in detail about patient's condition, plan of care and prognosis and all questions were addressed.   Disposition: Status is: Inpatient Remains inpatient appropriate because: IV diuresis   Planned Discharge Destination: Home    Author: Tawni Millers, MD 10/21/2022 2:07 PM  For on call review [email or web address].

## 2022-10-21 NOTE — Progress Notes (Signed)
*  PRELIMINARY RESULTS* Echocardiogram 2D Echocardiogram has been performed.  Loretta Gates 10/21/2022, 2:56 PM

## 2022-10-21 NOTE — Progress Notes (Signed)
Mobility Specialist Progress Note:   10/21/22 1007  Mobility  Activity Ambulated with assistance in room  Level of Assistance Standby assist, set-up cues, supervision of patient - no hands on  Assistive Device Cane [employer])  Distance Ambulated (ft) 10 ft  Activity Response Tolerated fair;RN notified  Mobility Referral Yes  $Mobility charge 1 Mobility   Pre-mobility: 77 HR During mobility: 80-130 HR Post-mobility: 82 HR  Pt received in bed and eager! Pt soaked in urine with PureWick still between legs and active. Upon standing, pt's HR increased to ~130 and pt c/o anxiety. Cleaned patient up and changed bed linen. Deferred further mobility for today d/t HR and Afib. Pt left in bed with all needs met, call bell in reach, and daughter in room. RN notified.   Andrey Campanile Mobility Specialist Please contact via SecureChat or  Rehab office at [phone]

## 2022-10-21 NOTE — Assessment & Plan Note (Signed)
Rate control atrial fibrillation, with metoprolol. Patient would like to transition to apixaban, will discontinue warfarin.  Bridge per pharmacy protocol.  Continue telemetry monitoring.

## 2022-10-21 NOTE — Assessment & Plan Note (Signed)
Calculated BMI is 44.1

## 2022-10-21 NOTE — Hospital Course (Signed)
Mrs. Wellnitz was admitted to the hospital with the working diagnosis of decompensated heart failure.   87 yo female with the past medical history of paroxysmal atrial fibrillation, hypertension, and obesity who presented with dyspnea. Reported 5 days of worsening dyspnea along with lower extremity edema. Because persistent symptoms and uncontrolled hypertension her family brought her to the ED. On her initial physical examination her blood pressure was 130/74, HR 73, RR 19 and 02 saturation 94%, heart with S1 and S2 present irregularly irregular, respiratory with rales but not wheezing, abdomen with no distention, positive lower extremity edema.   Na 139, K 4,0 Cl 105 bicarbonate 26 glucose 117 bun 17 cr 0,72  BNP 384  High sensitive troponin 5  Wbc 9,3 hgb 12.4 plt 271  INR 3,2  Sars covid 19 negative   Chest radiograph with mild cardiomegaly, bilateral hilar vascular congestion, fluid in the right fissure.   EKG 123 bpm, normal axis, normal qtc, atrial fibrillation rhythm with no significant ST segment or T wave changes.   Patient was placed on furosemide for diuresis.  Developed RVR atrial fibrillation and required diltiazem for rate control atrial fibrillation.   01/13 of diltiazem drip, rate control with increased dose of oral metoprolol.   01/14 Patient with improvement in volume status, continue to have episodic RVR with movement.  01/15 atrial fibrillation is rate controlled and now patient is euvolemic. Plan to discharge home and have follow up as outpatient,

## 2022-10-21 NOTE — Progress Notes (Addendum)
ANTICOAGULATION CONSULT NOTE - Initial Consult  Pharmacy Consult for warfarin Indication: atrial fibrillation  Allergies  Allergen Reactions   Bee Venom Swelling    Bee stung her on the inside of her gum. Lips and gums started swelling. No SOB.   Levaquin [Levofloxacin] Hives and Itching   Peanut-Containing Drug Products Other (See Comments)    Stomach pain and diarrhea     Patient Measurements: Height: '4\' 10"'$  (147.3 cm) Weight: 95.7 kg (211 lb) IBW/kg (Calculated) : 40.9  Vital Signs: Temp: 97.7 F (36.5 C) (01/12 2313) Temp Source: Oral (01/12 2313) BP: 126/106 (01/13 0506) Pulse Rate: 78 (01/13 0506)  Labs: Recent Labs    10/20/22 0959 10/20/22 1301 10/21/22 0016  HGB 12.4  --  11.6*  HCT 39.1  --  36.5  PLT 271  --  247  LABPROT 32.7*  --  31.1*  INR 3.2*  --  3.0*  CREATININE 0.72 0.78 0.77  TROPONINIHS 5  --   --      Estimated Creatinine Clearance: 51.9 mL/min (by C-G formula based on SCr of 0.77 mg/dL).   Medical History: Past Medical History:  Diagnosis Date   Anemia    Atrial fibrillation (HCC)    on Coumadin   Breast mass, right    Cancer (HCC)    LEFT BREAST-Mastectomy   GERD (gastroesophageal reflux disease)    Hypertension    OAB (overactive bladder)    PONV (postoperative nausea and vomiting)     Assessment: 64 yof presented to the ED with SOB. She is chronically anticoagulated with warfarin for history of afib. INR slightly above goal at 3.2 on admit, today therapeutic at 3.0 after holding dose. CBC stable.  PTA warfarin regimen: 2.'5mg'$  daily except '5mg'$  on Mon + Wed  Goal of Therapy:  INR 2-3 Monitor platelets by anticoagulation protocol: Yes   Plan:  Warfarin 2.'5mg'$  PO x1 tonight - reduced dose given borderline high INR Daily INR  ADDENDUM 1323: Pt to transition from warfarin to apixaban. Will need to wait until INR is subtherapeutic and will begin apixaban '5mg'$  BID at that time. If pt is discharged recommend starting apixaban  tomorrow.   Arrie Senate, PharmD, BCPS, [facility] Clinical Pharmacist [phone] Please check AMION for all [place] numbers 10/21/2022

## 2022-10-21 NOTE — Assessment & Plan Note (Signed)
Continue with pantoprazole/.  

## 2022-10-21 NOTE — Progress Notes (Signed)
Patient with lasix scheduled.  Due to cramping in legs patient asking to wait until 4am.  Dr. Nevada Crane notified.

## 2022-10-21 NOTE — Assessment & Plan Note (Signed)
Patient getting IV iron as outpatient.

## 2022-10-21 NOTE — Assessment & Plan Note (Signed)
Continue blood pressure monitoring Will resume ARB after diuresis.  Continue metoprolol.

## 2022-10-21 NOTE — Progress Notes (Addendum)
Patient showing 2.79sec pause on telemetry.  Patient currently on cardizem for afib. Dr. Myna Hidalgo notified.   Orders to stop cardizem.

## 2022-10-22 DIAGNOSIS — I5033 Acute on chronic diastolic (congestive) heart failure: Secondary | ICD-10-CM | POA: Diagnosis not present

## 2022-10-22 DIAGNOSIS — D509 Iron deficiency anemia, unspecified: Secondary | ICD-10-CM | POA: Diagnosis not present

## 2022-10-22 DIAGNOSIS — I4811 Longstanding persistent atrial fibrillation: Secondary | ICD-10-CM | POA: Diagnosis not present

## 2022-10-22 LAB — PROTIME-INR
INR: 1.7 — ABNORMAL HIGH (ref 0.8–1.2)
INR: 2.4 — ABNORMAL HIGH (ref 0.8–1.2)
Prothrombin Time: 20 seconds — ABNORMAL HIGH (ref 11.4–15.2)
Prothrombin Time: 25.7 seconds — ABNORMAL HIGH (ref 11.4–15.2)

## 2022-10-22 LAB — BASIC METABOLIC PANEL
Anion gap: 7 (ref 5–15)
BUN: 18 mg/dL (ref 8–23)
CO2: 27 mmol/L (ref 22–32)
Calcium: 10.2 mg/dL (ref 8.9–10.3)
Chloride: 105 mmol/L (ref 98–111)
Creatinine, Ser: 0.95 mg/dL (ref 0.44–1.00)
GFR, Estimated: 59 mL/min — ABNORMAL LOW (ref >60)
Glucose, Bld: 118 mg/dL — ABNORMAL HIGH (ref 70–99)
Potassium: 3.6 mmol/L (ref 3.5–5.1)
Sodium: 139 mmol/L (ref 135–145)

## 2022-10-22 LAB — MAGNESIUM: Magnesium: 2.4 mg/dL (ref 1.7–2.4)

## 2022-10-22 MED ORDER — APIXABAN 5 MG PO TABS: 5.0000 mg | ORAL_TABLET | Freq: Two times a day (BID) | ORAL | Status: DC

## 2022-10-22 MED ORDER — POTASSIUM CHLORIDE CRYS ER 20 MEQ PO TBCR: 40.0000 meq | EXTENDED_RELEASE_TABLET | Freq: Once | ORAL | Status: DC

## 2022-10-22 MED ORDER — POTASSIUM CHLORIDE CRYS ER 20 MEQ PO TBCR
40.0000 meq | EXTENDED_RELEASE_TABLET | ORAL | Status: AC
  Administered 2022-10-22 (×2): 40 meq via ORAL
  Filled 2022-10-22 (×2): qty 2

## 2022-10-22 MED ORDER — SPIRONOLACTONE 12.5 MG HALF TABLET: 12.5000 mg | ORAL_TABLET | Freq: Every day | ORAL | Status: DC

## 2022-10-22 MED ORDER — APIXABAN 5 MG PO TABS
5.0000 mg | ORAL_TABLET | Freq: Two times a day (BID) | ORAL | Status: DC
  Administered 2022-10-22 – 2022-10-23 (×3): 5 mg via ORAL
  Filled 2022-10-22 (×4): qty 1

## 2022-10-22 MED ORDER — FUROSEMIDE 20 MG PO TABS
20.0000 mg | ORAL_TABLET | Freq: Every day | ORAL | Status: DC
  Administered 2022-10-23: 20 mg via ORAL
  Filled 2022-10-22: qty 1

## 2022-10-22 MED ORDER — SPIRONOLACTONE 12.5 MG HALF TABLET
12.5000 mg | ORAL_TABLET | Freq: Every day | ORAL | Status: DC
  Administered 2022-10-22: 12.5 mg via ORAL
  Filled 2022-10-22 (×2): qty 1

## 2022-10-22 NOTE — Evaluation (Addendum)
Physical Therapy Evaluation Patient Details Name: Loretta Gates MRN: [id number] DOB: 1935/10/20 Today's Date: 10/22/2022  History of Present Illness  Pt is a 87 y.o. F who presents 10/20/2022 with working diagnosis of decompensated heart failure. Significant PMH: paroxysmal atrial fibrillation, HTN, obesity.  Clinical Impression  PTA, pt lives with her family and is independent with mobility using a quad cane. Pt presents with decreased cardiopulmonary endurance, mild balance deficits and functional weakness. Pt ambulating ~10 ft with a quad cane at a supervision level. HR 113-162 afib; deferred further ambulation due to increased HR. Desat to 79% on RA (poor pleth); SpO2 91-94% on 2L O2. Will benefit from continued acute PT to address deficits.     Recommendations for follow up therapy are one component of a multi-disciplinary discharge planning process, led by the attending physician.  Recommendations may be updated based on patient status, additional functional criteria and insurance authorization.  Follow Up Recommendations Outpatient PT      Assistance Recommended at Discharge PRN  Patient can return home with the following  Assistance with cooking/housework;Assist for transportation;Help with stairs or ramp for entrance    Equipment Recommendations None recommended by PT  Recommendations for Other Services       Functional Status Assessment Patient has had a recent decline in their functional status and demonstrates the ability to make significant improvements in function in a reasonable and predictable amount of time.     Precautions / Restrictions Precautions Precautions: Fall;Other (comment) Precaution Comments: watch HR Restrictions Weight Bearing Restrictions: No      Mobility  Bed Mobility Overal bed mobility: Modified Independent                  Transfers Overall transfer level: Needs assistance Equipment used: None Transfers: Sit to/from Stand, Bed  to chair/wheelchair/BSC Sit to Stand: Supervision   Step pivot transfers: Supervision       General transfer comment: Supervision for safety    Ambulation/Gait Ambulation/Gait assistance: Supervision Gait Distance (Feet): 10 Feet Assistive device: Quad cane Gait Pattern/deviations: Step-through pattern, Decreased stride length Gait velocity: decreased Gait velocity interpretation: <1.8 ft/sec, indicate of risk for recurrent falls   General Gait Details: Slow and steady pace, supervision for safety/lines  Stairs            Wheelchair Mobility    Modified Rankin (Stroke Patients Only)       Balance Overall balance assessment: Mild deficits observed, not formally tested                                           Pertinent Vitals/Pain Pain Assessment Pain Assessment: Faces Faces Pain Scale: Hurts a little bit Pain Location: BLE cramping Pain Descriptors / Indicators: Cramping Pain Intervention(s): Monitored during session    Home Living Family/patient expects to be discharged to:: Private residence Living Arrangements: Children;Spouse/significant other (husband, daughter) Available Help at Discharge: Family Type of Home: House Home Access: Stairs to enter Entrance Stairs-Rails: Psychiatric nurse of Steps: 4   Home Layout: Able to live on main level with bedroom/bathroom;Laundry or work area in [place]: Shower seat;Cane - quad;Cane - single point;Wheelchair - Publishing copy (2 wheels);Crutches;Adaptive equipment      Prior Function Prior Level of Function : Independent/Modified Independent             Mobility Comments: uses quad cane, 1 fall within past  6 months ADLs Comments: has sock aid, needs help with clastening bra     Hand Dominance        Extremity/Trunk Assessment   Upper Extremity Assessment Upper Extremity Assessment: Defer to OT evaluation    Lower Extremity  Assessment Lower Extremity Assessment: Overall WFL for tasks assessed       Communication   Communication: No difficulties  Cognition Arousal/Alertness: Awake/alert Behavior During Therapy: WFL for tasks assessed/performed Overall Cognitive Status: Within Functional Limits for tasks assessed                                          General Comments      Exercises     Assessment/Plan    PT Assessment Patient needs continued PT services  PT Problem List Decreased strength;Decreased activity tolerance;Decreased mobility;Decreased balance;Cardiopulmonary status limiting activity       PT Treatment Interventions DME instruction;Gait training;Stair training;Functional mobility training;Therapeutic activities;Therapeutic exercise;Balance training;Patient/family education    PT Goals (Current goals can be found in the Care Plan section)  Acute Rehab PT Goals Patient Stated Goal: go to [place] on vacation PT Goal Formulation: With patient Time For Goal Achievement: 11/05/22 Potential to Achieve Goals: Good    Frequency Min 3X/week     Co-evaluation               AM-PAC PT "6 Clicks" Mobility  Outcome Measure Help needed turning from your back to your side while in a flat bed without using bedrails?: None Help needed moving from lying on your back to sitting on the side of a flat bed without using bedrails?: None Help needed moving to and from a bed to a chair (including a wheelchair)?: A Little Help needed standing up from a chair using your arms (e.g., wheelchair or bedside chair)?: A Little Help needed to walk in hospital room?: A Little Help needed climbing 3-5 steps with a railing? : A Little 6 Click Score: 20    End of Session Equipment Utilized During Treatment: Oxygen Activity Tolerance: Other (comment) (limited due to HR) Patient left: in chair;with call bell/phone within reach Nurse Communication: Mobility status PT Visit Diagnosis:  Unsteadiness on feet (R26.81);Difficulty in walking, not elsewhere classified (R26.2)    Time: 0454-0981 PT Time Calculation (min) (ACUTE ONLY): 30 min   Charges:   PT Evaluation $PT Eval Moderate Complexity: 1 Mod PT Treatments $Therapeutic Activity: 8-22 mins        Wyona Almas, PT, DPT Acute Rehabilitation Services Office [phone]   Deno Etienne 10/22/2022, 8:55 AM

## 2022-10-22 NOTE — Progress Notes (Signed)
Progress Note   Patient: Loretta Gates IOX:[id number] DOB: 1936-01-24 DOA: 10/20/2022     2 DOS: the patient was seen and examined on 10/22/2022   Brief hospital course: Loretta Gates was admitted to the hospital with the working diagnosis of decompensated heart failure.   87 yo female with the past medical history of paroxysmal atrial fibrillation, hypertension, and obesity who presented with dyspnea. Reported 5 days of worsening dyspnea along with lower extremity edema. Because persistent symptoms and uncontrolled hypertension her family brought her to the ED. On her initial physical examination her blood pressure was 130/74, HR 73, RR 19 and 02 saturation 94%, heart with S1 and S2 present and rhythmic, respiratory with rales but not wheezing, abdomen with no distention, positive lower extremity edema.   Na 139, K 4,0 Cl 105 bicarbonate 26 glucose 117 bun 17 cr 0,72  BNP 384  High sensitive troponin 5  Wbc 9,3 hgb 12.4 plt 271  INR 3,2  Sars covid 19 negative   Chest radiograph with mild cardiomegaly, bilateral hilar vascular congestion, fluid in the right fissure.   EKG 123 bpm, normal axis, normal qtc, atrial fibrillation rhythm with no significant ST segment or T wave changes.   Patient was placed on furosemide for diuresis.  Developed RVR atrial fibrillation and required diltiazem for rate control atrial fibrillation.   01/13 of diltiazem drip, rate control with oral metoprolol.   01/14 Patient with improvement in volume status, continue to have episodic RVR with movement.   Assessment and Plan: * Acute on chronic diastolic CHF (congestive heart failure) (HCC) Echocardiogram preserved LV systolic function with EF 55 to 26%, RV systolic function preserved. RV size with mild enlargement. Mild to moderate TR. RVSP 52,2   Acute on chronic core pulmonale Pulmonary hypertension.   Urine output 5,650 ml. Systolic blood pressure 834 to 140 mmHg. .   Improved volume status and will  transition to oral furosemide in am.  Continue with SGLT 2 inh and add spironolactone.  Patient at home on irbesartan.  Continue with metoprolol increased dose to 50 mg po bid to Gates rate control atrial fibrillation.   Continue Kcl to prevent hypokalemia.   A-fib [facility]) Patient had episodic RVR with movement. At rest in the 80s continue atrial fibrillation.   Continue rate control with metoprolol 50 mg po bid, at home patient on 25 mg metoprolol succinate. Transition to oral apixaban per pharmacy protocol, today INR is 2,4.  Continue telemetry monitoring   Essential hypertension Continue blood pressure monitoring Continue with metoprolol, and diuresis with SGLT 2 inh, furosemide and spironolactone.  Resume irbersartan at the time of discharge in am.   IDA (iron deficiency anemia) Patient getting IV iron as outpatient.   GERD (gastroesophageal reflux disease) Continue with pantoprazole.   Class 3 obesity (HCC) Calculated BMI is 44.1         Subjective: Patient is feeling Gates, close to her baseline, no chest pain and no dyspnea.   Physical Exam: Vitals:   10/22/22 0444 10/22/22 0738 10/22/22 0800 10/22/22 1128  BP:  124/80  (!) 142/90  Pulse:  79  98  Resp:  '16 20 19  '$ Temp:  (!) 97.5 F (36.4 C)  98.4 F (36.9 C)  TempSrc:  Oral  Oral  SpO2:  90% 92% 90%  Weight: 87.7 kg     Height:       Neurology awake and alert ENT with mild pallor Cardiovascular with S1 and S2 present, irregularly irregular with  no gallops, rubs or murmurs Respiratory with no raled or wheezing, no rhonchi Abdomen with no distention  Non pitting lower extremity edema  Data Reviewed:    Family Communication: I spoke with patient's daughter and husband  at the bedside, we talked in detail about patient's condition, plan of care and prognosis and all questions were addressed.   Disposition: Status is: Inpatient Remains inpatient appropriate because: telemetry monitoring for 24 hrs  more, possible discharge home tomorrow.   Planned Discharge Destination: Home     Author: Tawni Millers, MD 10/22/2022 12:41 PM  For on call review [email or web address].

## 2022-10-22 NOTE — Progress Notes (Addendum)
[place] for warfarin to apixaban Indication: atrial fibrillation  Allergies  Allergen Reactions   Bee Venom Swelling    Bee stung her on the inside of her gum. Lips and gums started swelling. No SOB.   Levaquin [Levofloxacin] Hives and Itching   Peanut-Containing Drug Products Other (See Comments)    Stomach pain and diarrhea     Patient Measurements: Height: '4\' 10"'$  (147.3 cm) Weight: 87.7 kg (193 lb 5.5 oz) IBW/kg (Calculated) : 40.9  Vital Signs: Temp: 98.4 F (36.9 C) (01/14 1128) Temp Source: Oral (01/14 1128) BP: 142/90 (01/14 1128) Pulse Rate: 98 (01/14 1128)  Labs: Recent Labs    10/20/22 0959 10/20/22 1301 10/21/22 0016 10/22/22 0026  HGB 12.4  --  11.6*  --   HCT 39.1  --  36.5  --   PLT 271  --  247  --   LABPROT 32.7*  --  31.1* 25.7*  INR 3.2*  --  3.0* 2.4*  CREATININE 0.72 0.78 0.77 0.95  TROPONINIHS 5  --   --   --      Estimated Creatinine Clearance: 41.5 mL/min (by C-G formula based on SCr of 0.95 mg/dL).   Medical History: Past Medical History:  Diagnosis Date   Anemia    Atrial fibrillation (HCC)    on Coumadin   Breast mass, right    Cancer (HCC)    LEFT BREAST-Mastectomy   GERD (gastroesophageal reflux disease)    Hypertension    OAB (overactive bladder)    PONV (postoperative nausea and vomiting)     Assessment: 10 yof presented to the ED with SOB. She is chronically anticoagulated with warfarin for history of afib. Pharmacy asked to transition to apixaban.  INR 2.4 this am - will recheck later today.  PTA warfarin regimen: 2.'5mg'$  daily except '5mg'$  on Mon + Wed  Goal of Therapy:  INR 2-3 Monitor platelets by anticoagulation protocol: Yes   Plan:  Hold warfarin Apixaban '5mg'$  BID once INR </=2  ADDENDUM: INR down to 1.7, will begin apixaban.  Arrie Senate, PharmD, BCPS, [facility] Clinical Pharmacist [phone] Please check AMION for all [place] numbers 10/22/2022

## 2022-10-22 NOTE — Plan of Care (Signed)
  Problem: Education: Goal: Knowledge of General Education information will improve Description: Including pain rating scale, medication(s)/side effects and non-pharmacologic comfort measures Outcome: Progressing   Problem: Clinical Measurements: Goal: Cardiovascular complication will be avoided Outcome: Progressing   Problem: Activity: Goal: Risk for activity intolerance will decrease Outcome: Progressing   Problem: Pain Managment: Goal: General experience of comfort will improve Outcome: Progressing   

## 2022-10-22 NOTE — Evaluation (Addendum)
Occupational Therapy Evaluation Patient Details Name: Loretta Gates MRN: [id number] DOB: [birth year] Today's Date: 10/22/2022   History of Present Illness Pt is a 87 y.o. F who presents 10/20/2022 with working diagnosis of decompensated heart failure. Significant PMH: paroxysmal atrial fibrillation, HTN, obesity.   Clinical Impression   Loretta Gates was evaluated s/p the above admission list, she is generally mod I at baseline. Upon evaluation pt had functional limitations due to decreased activity tolerance, generalized weakness, increased SOB with activity and unsteady gait. Overall she needed min G for mobility with quad cane (more steady with cane in R hand vs L); and mod A for LB ADLs. Pt's HR to 130 with mobility, recovered to 100s with standing rest break. Pt is eager to recover as she has a vacation planned for 01/2023. Recommend d/c with OP OT, pt and family agreeable. OT to follow acutely.      Recommendations for follow up therapy are one component of a multi-disciplinary discharge planning process, led by the attending physician.  Recommendations may be updated based on patient status, additional functional criteria and insurance authorization.   Follow Up Recommendations  Outpatient OT     Assistance Recommended at Discharge Intermittent Supervision/Assistance  Patient can return home with the following A little help with walking and/or transfers;Two people to help with walking and/or transfers;Assistance with cooking/housework;Assist for transportation;Help with stairs or ramp for entrance    Functional Status Assessment  Patient has had a recent decline in their functional status and demonstrates the ability to make significant improvements in function in a reasonable and predictable amount of time.  Equipment Recommendations  None recommended by OT    Recommendations for Other Services       Precautions / Restrictions Precautions Precautions: Fall;Other (comment) Precaution  Comments: watch HR Restrictions Weight Bearing Restrictions: No      Mobility Bed Mobility Overal bed mobility: Modified Independent                  Transfers Overall transfer level: Needs assistance Equipment used: None Transfers: Sit to/from Stand, Bed to chair/wheelchair/BSC Sit to Stand: Supervision                  Balance Overall balance assessment: Needs assistance Sitting-balance support: Feet supported Sitting balance-Leahy Scale: Good     Standing balance support: Single extremity supported, During functional activity Standing balance-Leahy Scale: Fair                             ADL either performed or assessed with clinical judgement   ADL Overall ADL's : Needs assistance/impaired Eating/Feeding: Independent;Sitting   Grooming: Min guard;Standing   Upper Body Bathing: Set up;Sitting   Lower Body Bathing: Minimal assistance;Sit to/from stand   Upper Body Dressing : Set up;Sitting   Lower Body Dressing: Minimal assistance;Sit to/from stand   Toilet Transfer: Min guard;Ambulation (cane)   Toileting- Clothing Manipulation and Hygiene: Supervision/safety;Sitting/lateral lean       Functional mobility during ADLs: Min guard General ADL Comments: does better with quad cane in R hand     Vision Baseline Vision/History: 0 No visual deficits Vision Assessment?: No apparent visual deficits     Perception Perception Perception Tested?: No   Praxis Praxis Praxis tested?: Not tested    Pertinent Vitals/Pain Pain Assessment Pain Assessment: Faces Faces Pain Scale: No hurt Pain Intervention(s): Monitored during session     Hand Dominance Left   Extremity/Trunk Assessment Upper Extremity  Assessment Upper Extremity Assessment: Generalized weakness   Lower Extremity Assessment Lower Extremity Assessment: Generalized weakness   Cervical / Trunk Assessment Cervical / Trunk Assessment: Kyphotic (mild)   Communication  Communication Communication: No difficulties   Cognition Arousal/Alertness: Awake/alert Behavior During Therapy: WFL for tasks assessed/performed Overall Cognitive Status: Within Functional Limits for tasks assessed                                       General Comments  SpO2 stable on RA, HR to 130 with mobility    Exercises     Shoulder Instructions      Home Living Family/patient expects to be discharged to:: Private residence Living Arrangements: Children;Spouse/significant other Available Help at Discharge: Family Type of Home: House Home Access: Stairs to enter Technical brewer of Steps: 4 Entrance Stairs-Rails: Right;Left Home Layout: Able to live on main level with bedroom/bathroom;Laundry or work area in basement     [employer] Shower/Tub: Tub/shower unit         Home Equipment: Shower seat;Cane - quad;Cane - single point;Wheelchair - Publishing copy (2 wheels);Crutches;Adaptive equipment Adaptive Equipment: Sock aid        Prior Functioning/Environment Prior Level of Function : Independent/Modified Independent             Mobility Comments: uses quad cane, 1 fall within past 6 months ADLs Comments: has sock aid, needs help with clastening bra        OT Problem List: Decreased strength;Decreased range of motion;Decreased activity tolerance;Impaired balance (sitting and/or standing);Decreased safety awareness;Decreased knowledge of use of DME or AE;Decreased knowledge of precautions      OT Treatment/Interventions: Therapeutic exercise;Self-care/ADL training;Cognitive remediation/compensation;Patient/family education;Balance training    OT Goals(Current goals can be found in the care plan section) Acute Rehab OT Goals Patient Stated Goal: home OT Goal Formulation: With patient Time For Goal Achievement: 11/05/22 Potential to Achieve Goals: Good ADL Goals Pt Will Perform Grooming: with modified independence;standing Pt  Will Perform Lower Body Dressing: with modified independence;sit to/from stand Pt Will Transfer to Toilet: with modified independence;ambulating Pt/caregiver will Perform Home Exercise Program: Increased strength;With written HEP provided;Both right and left upper extremity;With Supervision  OT Frequency: Min 2X/week    Co-evaluation              AM-PAC OT "6 Clicks" Daily Activity     Outcome Measure Help from another person eating meals?: None Help from another person taking care of personal grooming?: A Little Help from another person toileting, which includes using toliet, bedpan, or urinal?: A Little Help from another person bathing (including washing, rinsing, drying)?: A Little Help from another person to put on and taking off regular upper body clothing?: A Little Help from another person to put on and taking off regular lower body clothing?: A Little 6 Click Score: 19   End of Session Equipment Utilized During Treatment: Gait belt Nurse Communication: Mobility status  Activity Tolerance: Patient tolerated treatment well Patient left: in bed;with call bell/phone within reach;with family/visitor present  OT Visit Diagnosis: Unsteadiness on feet (R26.81);Other abnormalities of gait and mobility (R26.89);Muscle weakness (generalized) (M62.81)                Time: 1501-1520 OT Time Calculation (min): 19 min Charges:  OT General Charges $OT Visit: 1 Visit OT Evaluation $OT Eval Moderate Complexity: 1 Mod    Uchechukwu Dhawan D Causey 10/22/2022, 4:11 PM

## 2022-10-23 ENCOUNTER — Other Ambulatory Visit (HOSPITAL_COMMUNITY): Payer: Self-pay

## 2022-10-23 ENCOUNTER — Encounter: Payer: Self-pay | Admitting: Family

## 2022-10-23 ENCOUNTER — Inpatient Hospital Stay: Payer: Medicare HMO

## 2022-10-23 DIAGNOSIS — D509 Iron deficiency anemia, unspecified: Secondary | ICD-10-CM | POA: Diagnosis not present

## 2022-10-23 DIAGNOSIS — N3944 Nocturnal enuresis: Secondary | ICD-10-CM | POA: Diagnosis not present

## 2022-10-23 DIAGNOSIS — I5033 Acute on chronic diastolic (congestive) heart failure: Secondary | ICD-10-CM | POA: Diagnosis not present

## 2022-10-23 DIAGNOSIS — R310 Gross hematuria: Secondary | ICD-10-CM | POA: Diagnosis not present

## 2022-10-23 DIAGNOSIS — I4811 Longstanding persistent atrial fibrillation: Secondary | ICD-10-CM | POA: Diagnosis not present

## 2022-10-23 LAB — MAGNESIUM: Magnesium: 2.3 mg/dL (ref 1.7–2.4)

## 2022-10-23 LAB — BASIC METABOLIC PANEL
Anion gap: 7 (ref 5–15)
BUN: 21 mg/dL (ref 8–23)
CO2: 26 mmol/L (ref 22–32)
Calcium: 10.6 mg/dL — ABNORMAL HIGH (ref 8.9–10.3)
Chloride: 106 mmol/L (ref 98–111)
Creatinine, Ser: 1.08 mg/dL — ABNORMAL HIGH (ref 0.44–1.00)
GFR, Estimated: 51 mL/min — ABNORMAL LOW (ref >60)
Glucose, Bld: 125 mg/dL — ABNORMAL HIGH (ref 70–99)
Potassium: 4.3 mmol/L (ref 3.5–5.1)
Sodium: 139 mmol/L (ref 135–145)

## 2022-10-23 MED ORDER — METOPROLOL TARTRATE 50 MG PO TABS
50.0000 mg | ORAL_TABLET | Freq: Two times a day (BID) | ORAL | 0 refills | Status: DC
  Filled 2022-10-23: qty 60, 30d supply, fill #0

## 2022-10-23 MED ORDER — EMPAGLIFLOZIN 10 MG PO TABS
10.0000 mg | ORAL_TABLET | Freq: Every day | ORAL | 0 refills | Status: DC
  Filled 2022-10-23: qty 30, 30d supply, fill #0

## 2022-10-23 MED ORDER — FUROSEMIDE 20 MG PO TABS
20.0000 mg | ORAL_TABLET | Freq: Every day | ORAL | 0 refills | Status: DC
  Filled 2022-10-23: qty 30, 30d supply, fill #0

## 2022-10-23 MED ORDER — APIXABAN 5 MG PO TABS
5.0000 mg | ORAL_TABLET | Freq: Two times a day (BID) | ORAL | 0 refills | Status: DC
  Filled 2022-10-23: qty 60, 30d supply, fill #0

## 2022-10-23 NOTE — TOC Transition Note (Signed)
Transition of Care [facility]) - CM/SW Discharge Note   Patient Details  Name: Loretta Gates MRN: [id number] Date of Birth: May 13, 1936  [facility] CM/SW Contact:  Zenon Mayo, RN Phone Number: 10/23/2022, 9:57 AM   Clinical Narrative:     Patient is for dc today, will need outpatient PT and OT.  NCM spoke with patient, she states she would like to go to the outpatient therapy on [place] in [place].  NCM sent referral thru epic for referral for outpatient PT/OT.  Patient states her daughter will dc her home today. She has no other needs.        Patient Goals and CMS Choice      Discharge Placement                         Discharge Plan and Services Additional resources added to the After Visit Summary for                                       Social Determinants of Health (SDOH) Interventions SDOH Screenings   Food Insecurity: No Food Insecurity (10/21/2022)  Housing: Low Risk  (10/21/2022)  Transportation Needs: No Transportation Needs (10/21/2022)  Utilities: Not At Risk (10/21/2022)  Alcohol Screen: Low Risk  (01/27/2022)  Depression (PHQ2-9): Low Risk  (01/27/2022)  Financial Resource Strain: Low Risk  (01/27/2022)  Physical Activity: Insufficiently Active (01/27/2022)  Social Connections: Socially Integrated (01/27/2022)  Stress: No Stress Concern Present (01/27/2022)  Tobacco Use: Low Risk  (10/20/2022)     Readmission Risk Interventions     No data to display

## 2022-10-23 NOTE — Plan of Care (Signed)

## 2022-10-23 NOTE — Progress Notes (Signed)
Heart Failure Nurse Navigator Progress Note  PCP: Copland, Gay Filler, MD PCP-Cardiologist: Was Jeanmarie Plant, looking for new one thru [facility].  Admission Diagnosis: Atrial fibrillation with rapid ventricular response, Acute on chronic congestive heart failure..   Admitted from: Home  Presentation:   Loretta Gates presented with shortness of breath, nasal congestion since her iron transfusion 5 days prior. Including 2+ BLE to her feet, reported her symptoms continue to get worse, she doesn't use lasix regularly and now her abdomen feels swollen. BP 137/100, HR 76, BMI 44.10, EKG with Atrial fibrillation, BNP 384, IV lasix given and Cardizem drip started.  Patient was educated on the sign and symptoms of heart failure, daily weights, when to call her doctor or go to the ED, Diet/ fluid restrictions, taking all medications as prescribed and attending all medical appointments. Patient verbalized her understanding, a Hospital HF TOC follow up was scheduled for 11/01/2022.   ECHO/ LVEF: 55-60%  Clinical Course:  Past Medical History:  Diagnosis Date   Anemia    Atrial fibrillation (HCC)    on Coumadin   Breast mass, right    Cancer (HCC)    LEFT BREAST-Mastectomy   GERD (gastroesophageal reflux disease)    Hypertension    OAB (overactive bladder)    PONV (postoperative nausea and vomiting)      Social History   Socioeconomic History   Marital status: Married    Spouse name: Not on file   Number of children: Not on file   Years of education: Not on file   Highest education level: Not on file  Occupational History   Not on file  Tobacco Use   Smoking status: Never   Smokeless tobacco: Never  Vaping Use   Vaping Use: Never used  Substance and Sexual Activity   Alcohol use: Never   Drug use: Never   Sexual activity: Not Currently    Birth control/protection: Post-menopausal  Other Topics Concern   Not on file  Social History Narrative   Not on file   Social Determinants of  Health   Financial Resource Strain: Low Risk  (01/27/2022)   Overall Financial Resource Strain (CARDIA)    Difficulty of Paying Living Expenses: Not hard at all  Food Insecurity: No Food Insecurity (10/21/2022)   Hunger Vital Sign    Worried About Running Out of Food in the Last Year: Never true    Ran Out of Food in the Last Year: Never true  Transportation Needs: No Transportation Needs (10/21/2022)   PRAPARE - Hydrologist (Medical): No    Lack of Transportation (Non-Medical): No  Physical Activity: Insufficiently Active (01/27/2022)   Exercise Vital Sign    Days of Exercise per Week: 3 days    Minutes of Exercise per Session: 30 min  Stress: No Stress Concern Present (01/27/2022)   [place]    Feeling of Stress : Not at all  Social Connections: [place] (01/27/2022)   Social Connection and Isolation Panel [NHANES]    Frequency of Communication with Friends and Family: More than three times a week    Frequency of Social Gatherings with Friends and Family: More than three times a week    Attends Religious Services: More than 4 times per year    Active Member of [employer] or Organizations: Yes    Attends Music therapist: More than 4 times per year    Marital Status: Married  Education Assessment and Provision:  Detailed education and instructions provided on heart failure disease management including the following:  Signs and symptoms of Heart Failure When to call the physician Importance of daily weights Low sodium diet Fluid restriction Medication management Anticipated future follow-up appointments  Patient education given on each of the above topics.  Patient acknowledges understanding via teach back method and acceptance of all instructions.  Education Materials:  "Living Better With Heart Failure" Booklet, HF zone tool, & Daily Weight Tracker  Tool.  Patient has scale at home: yes Patient has pill box at home: NA    High Risk Criteria for Readmission and/or Poor Patient Outcomes: Heart failure hospital admissions (last 6 months): 1  No Show rate: 0 Difficult social situation: No Demonstrates medication adherence: Yes Primary Language: English Literacy level: Reading, writing, and comprehension  Barriers of Care:   Continued HF education Diet/ fluids/ daily weights  Considerations/Referrals:   Referral made to Heart Failure Pharmacist Stewardship: No Referral made to Heart Failure CSW/NCM TOC: No Referral made to Heart & Vascular TOC clinic: Yes, 11/01/22  Items for Follow-up on DC/TOC: Continued HF education Diet/ fluids/daily weights     Earnestine Leys, BSN, RN Heart Failure Leisure centre manager Chat Only

## 2022-10-23 NOTE — Discharge Instructions (Signed)

## 2022-10-23 NOTE — Discharge Summary (Signed)
Physician Discharge Summary   Patient: Loretta Gates MRN: [id number] DOB: [birth year]  Admit date:     10/20/2022  Discharge date: 10/23/22  Discharge Physician: Jimmy Picket Shaquavia Whisonant   PCP: Darreld Mclean, MD   Recommendations at discharge:    Continue diuresis with daily furosemide, plan to increase to 40 mg daily in case of volume overload, increase 2 to 3 lbs in 24 hrs or 5 lbs in 7 days. Metoprolol has been increased to 50 mg po bid, if continue good toleration as outpatient, recommended to transition to metoprolol succinate.  Patient has been transitioned from warfarin to apixaban for anticoagulation, non valvular atrial fibrillation.  Added empagliflozin for heart failure and continue irbesartan, if remains stable, possible addition of spironolactone as outpatient. Follow up renal function and electrolytes in 7 days. Follow up with Dr Lorelei Pont in 7 to 10 days. Follow up with the heart failure clinic.  Out patient PT and OT.   I spoke with patient's daughter at the bedside, we talked in detail about patient's condition, plan of care and prognosis and all questions were addressed.   Discharge Diagnoses: Principal Problem:   Acute on chronic diastolic CHF (congestive heart failure) (HCC) Active Problems:   A-fib (HCC)   Essential hypertension   IDA (iron deficiency anemia)   GERD (gastroesophageal reflux disease)   Class 3 obesity (HCC)  Resolved Problems:   * No resolved hospital problems. [facility] Course: Loretta Gates was admitted to the hospital with the working diagnosis of decompensated heart failure.   87 yo female with the past medical history of paroxysmal atrial fibrillation, hypertension, and obesity who presented with dyspnea. Reported 5 days of worsening dyspnea along with lower extremity edema. Because persistent symptoms and uncontrolled hypertension her family brought her to the ED. On her initial physical examination her blood pressure was 130/74, HR  73, RR 19 and 02 saturation 94%, heart with S1 and S2 present irregularly irregular, respiratory with rales but not wheezing, abdomen with no distention, positive lower extremity edema.   Na 139, K 4,0 Cl 105 bicarbonate 26 glucose 117 bun 17 cr 0,72  BNP 384  High sensitive troponin 5  Wbc 9,3 hgb 12.4 plt 271  INR 3,2  Sars covid 19 negative   Chest radiograph with mild cardiomegaly, bilateral hilar vascular congestion, fluid in the right fissure.   EKG 123 bpm, normal axis, normal qtc, atrial fibrillation rhythm with no significant ST segment or T wave changes.   Patient was placed on furosemide for diuresis.  Developed RVR atrial fibrillation and required diltiazem for rate control atrial fibrillation.   01/13 of diltiazem drip, rate control with increased dose of oral metoprolol.   01/14 Patient with improvement in volume status, continue to have episodic RVR with movement.  01/15 atrial fibrillation is rate controlled and now patient is euvolemic. Plan to discharge home and have follow up as outpatient,   Assessment and Plan: * Acute on chronic diastolic CHF (congestive heart failure) (HCC) Echocardiogram preserved LV systolic function with EF 55 to 16%, RV systolic function preserved. RV size with mild enlargement. Mild to moderate TR. RVSP 52,2   Acute on chronic core pulmonale Pulmonary hypertension.   Patient was placed on furosemide for diuresis, negative fluid balance was achieved, - 7,663 ml, weight loss 10.2 Kg, with significant improvement in her symptoms.   Continue heart failure regimen with metoprolol, empaglliflozin and irbesartan.  Diuresis with daily furosemide with instructions to increase to 40 mg in  case of volume overload. Follow up with heart failure clinic, possible starting spironolactone as outpatient.   A-fib [facility]) Patient had improvement in rate control with an increased dose of metoprolol 50 mg po bid. She has been transitioned from warfarin to  apixaban for anticoagulation. (Non valvular atrial fibrillation).   If she continue to have good toleration of current dose of metoprolol, recommended to transition to metoprolol succinate.   Essential hypertension Continue blood pressure control with metoprolol, irbesartan and diuretic therapy with furosemide.    IDA (iron deficiency anemia) Patient getting IV iron as outpatient.   GERD (gastroesophageal reflux disease) Continue with pantoprazole.   Class 3 obesity (HCC) Calculated BMI is 44.1          Consultants: none  Procedures performed: none   Disposition: Home Diet recommendation:  Cardiac diet DISCHARGE MEDICATION: Allergies as of 10/23/2022       Reactions   Bee Venom Swelling   Bee stung her on the inside of her gum. Lips and gums started swelling. No SOB.   Levaquin [levofloxacin] Hives, Itching   Peanut-containing Drug Products Other (See Comments)   Stomach pain and diarrhea         Medication List     STOP taking these medications    amLODipine 5 MG tablet Commonly known as: NORVASC   HYDROcodone-acetaminophen 5-325 MG tablet Commonly known as: NORCO/VICODIN   metoprolol succinate 25 MG 24 hr tablet Commonly known as: TOPROL-XL   warfarin 5 MG tablet Commonly known as: COUMADIN       TAKE these medications    apixaban 5 MG Tabs tablet Commonly known as: ELIQUIS Take 1 tablet (5 mg total) by mouth 2 (two) times daily.   cholestyramine 4 GM/DOSE powder Commonly known as: QUESTRAN Take 4 g by mouth See admin instructions. Mix 1 teaspoon in water and take at 6pm daily with warfarin.   denosumab 60 MG/ML Sosy injection Commonly known as: PROLIA Inject 60 mg into the skin every 6 (six) months.   empagliflozin 10 MG Tabs tablet Commonly known as: JARDIANCE Take 1 tablet (10 mg total) by mouth daily.   ergocalciferol 1.25 MG (50000 UT) capsule Commonly known as: VITAMIN D2 Take 50,000 Units by mouth every 14 (fourteen) days. Every  other saturday   furosemide 20 MG tablet Commonly known as: LASIX Take 1 tablet (20 mg total) by mouth daily. What changed:  how much to take how to take this when to take this additional instructions   irbesartan 300 MG tablet Commonly known as: AVAPRO Take 1 tablet (300 mg total) by mouth daily.   magnesium oxide 400 (240 Mg) MG tablet Commonly known as: MAG-OX Take 400 mg by mouth daily.   metoprolol tartrate 50 MG tablet Commonly known as: LOPRESSOR Take 1 tablet (50 mg total) by mouth 2 (two) times daily.   omeprazole 40 MG capsule Commonly known as: PRILOSEC Take 1 capsule (40 mg total) by mouth 2 (two) times daily before a meal.   oxybutynin 15 MG 24 hr tablet Commonly known as: DITROPAN XL Take 1 tablet (15 mg total) by mouth at bedtime.        Discharge Exam: Filed Weights   10/22/22 0433 10/22/22 0444 10/23/22 0636  Weight: 87.7 kg 87.7 kg 87.3 kg   BP 129/73 (BP Location: Right Arm)   Pulse 83   Temp 97.7 F (36.5 C) (Oral)   Resp (!) 21   Ht '4\' 10"'$  (1.473 m)   Wt 87.3 kg  SpO2 94%   BMI 40.22 kg/m   Patient is feeling better, no dyspnea and no lower extremity edema  Neurology awake and alert ENT with no pallor Cardiovascular with S1 and S2 present, irregularly irregular with no gallops, rubs or murmurs No JVD No lower extremity edema Respiratory with mild rales at bases with no wheezing, or rhonchi Abdomen with no distention   Condition at discharge: stable  The results of significant diagnostics from this hospitalization (including imaging, microbiology, ancillary and laboratory) are listed below for reference.   Imaging Studies: ECHOCARDIOGRAM COMPLETE  Result Date: 10/21/2022    ECHOCARDIOGRAM REPORT   Patient Name:   DAWNITA MOLNER Date of Exam: 10/21/2022 Medical Rec #:  [id number]       Height:       58.0 in Accession #:    [id number]      Weight:       211.0 lb Date of Birth:  October 08, 1936       BSA:          1.863 m Patient Age:     41 years        BP:           119/81 mmHg Patient Gender: F               HR:           89 bpm. Exam Location:  Inpatient Procedure: 2D Echo, Cardiac Doppler, Color Doppler and Intracardiac            Opacification Agent Indications:    CHF-Acute Diastolic Y40.34  History:        Patient has no prior history of Echocardiogram examinations.                 CHF, Arrythmias:Atrial Fibrillation, Signs/Symptoms:Shortness of                 Breath; Risk Factors:Non-Smoker and Hypertension.  Sonographer:    Greer Pickerel Referring Phys: [id number] [place]  Sonographer Comments: Suboptimal subcostal window. Image acquisition challenging due to patient body habitus, Image acquisition challenging due to mastectomy and Image acquisition challenging due to respiratory motion. IMPRESSIONS  1. Left ventricular ejection fraction, by estimation, is 55 to 60%. The left ventricle has normal function. The left ventricle has no regional wall motion abnormalities. Left ventricular diastolic function could not be evaluated.  2. Right ventricular systolic function is normal. The right ventricular size is mildly enlarged. There is moderately elevated pulmonary artery systolic pressure.  3. Left atrial size was moderately dilated.  4. Right atrial size was mildly dilated.  5. The mitral valve is normal in structure. Mild mitral valve regurgitation. No evidence of mitral stenosis.  6. Tricuspid valve regurgitation is mild to moderate.  7. The aortic valve is normal in structure. Aortic valve regurgitation is not visualized. No aortic stenosis is present.  8. The inferior vena cava is dilated in size with <50% respiratory variability, suggesting right atrial pressure of 15 mmHg. FINDINGS  Left Ventricle: Left ventricular ejection fraction, by estimation, is 55 to 60%. The left ventricle has normal function. The left ventricle has no regional wall motion abnormalities. Definity contrast agent was given IV to delineate the left ventricular   endocardial borders. The left ventricular internal cavity size was normal in size. There is no left ventricular hypertrophy. Left ventricular diastolic function could not be evaluated due to atrial fibrillation. Left ventricular diastolic function could  not be evaluated. Right Ventricle: The right  ventricular size is mildly enlarged. Right ventricular systolic function is normal. There is moderately elevated pulmonary artery systolic pressure. The tricuspid regurgitant velocity is 3.05 m/s, and with an assumed right atrial pressure of 15 mmHg, the estimated right ventricular systolic pressure is 66.4 mmHg. Left Atrium: Left atrial size was moderately dilated. Right Atrium: Right atrial size was mildly dilated. Pericardium: There is no evidence of pericardial effusion. Mitral Valve: The mitral valve is normal in structure. Mild mitral valve regurgitation. No evidence of mitral valve stenosis. Tricuspid Valve: The tricuspid valve is normal in structure. Tricuspid valve regurgitation is mild to moderate. No evidence of tricuspid stenosis. Aortic Valve: The aortic valve is normal in structure. Aortic valve regurgitation is not visualized. No aortic stenosis is present. Pulmonic Valve: The pulmonic valve was normal in structure. Pulmonic valve regurgitation is not visualized. No evidence of pulmonic stenosis. Aorta: The aortic root is normal in size and structure. Venous: The inferior vena cava is dilated in size with less than 50% respiratory variability, suggesting right atrial pressure of 15 mmHg. IAS/Shunts: No atrial level shunt detected by color flow Doppler.  LEFT VENTRICLE PLAX 2D LVIDd:         3.50 cm   Diastology LVIDs:         2.30 cm   LV e' medial:    10.70 cm/s LV PW:         1.00 cm   LV E/e' medial:  10.2 LV IVS:        1.00 cm   LV e' lateral:   13.10 cm/s LVOT diam:     1.80 cm   LV E/e' lateral: 8.3 LV SV:         40 LV SV Index:   22 LVOT Area:     2.54 cm  RIGHT VENTRICLE RV S prime:     9.79 cm/s  TAPSE (M-mode): 1.4 cm LEFT ATRIUM             Index        RIGHT ATRIUM           Index LA diam:        3.60 cm 1.93 cm/m   RA Area:     14.30 cm LA Vol (A2C):   75.1 ml 40.31 ml/m  RA Volume:   30.50 ml  16.37 ml/m LA Vol (A4C):   43.9 ml 23.56 ml/m LA Biplane Vol: 61.6 ml 33.06 ml/m  AORTIC VALVE LVOT Vmax:   87.00 cm/s LVOT Vmean:  55.800 cm/s LVOT VTI:    0.158 m  AORTA Ao Root diam: 3.00 cm Ao Asc diam:  2.80 cm MITRAL VALVE                TRICUSPID VALVE MV Area (PHT): 3.31 cm     TR Peak grad:   37.2 mmHg MV Decel Time: 229 msec     TR Vmax:        305.00 cm/s MR Peak grad: 41.5 mmHg MR Vmax:      322.00 cm/s   SHUNTS MV E velocity: 109.00 cm/s  Systemic VTI:  0.16 m                             Systemic Diam: 1.80 cm Kirk Ruths MD Electronically signed by Kirk Ruths MD Signature Date/Time: 10/21/2022/3:01:14 PM    Final    DG Chest Port 1 View  Result Date: 10/20/2022 CLINICAL DATA:  Shortness of breath EXAM: PORTABLE CHEST 1 VIEW COMPARISON:  Previous studies including the examination done earlier today FINDINGS: Transverse diameter of heart is increased. Central pulmonary vessels are prominent. There is slight decrease in interstitial markings there is minimal blunting of lateral CP angles. There is no pneumothorax. Surgical clips are seen in left thyroid bed. IMPRESSION: Cardiomegaly. There is decrease in interstitial markings in both lungs suggesting possible resolving interstitial edema. There are no new focal infiltrates. Blunting of costophrenic angles may suggest minimal pleural effusions Electronically Signed   By: Elmer Picker M.D.   On: 10/20/2022 13:25   DG Chest Port 1 View  Result Date: 10/20/2022 CLINICAL DATA:  [id number] SOB (shortness of breath) [id number] EXAM: PORTABLE CHEST - 1 VIEW COMPARISON:  12/22/2020 FINDINGS: Cardiac silhouette is prominent. There is pulmonary interstitial prominence with vascular congestion. No focal consolidation. No pneumothorax or pleural  effusion identified. IMPRESSION: Findings suggest CHF. Electronically Signed   By: Sammie Bench M.D.   On: 10/20/2022 09:54    Microbiology: Results for orders placed or performed during the hospital encounter of 10/20/22  Resp panel by RT-PCR (RSV, Flu A&B, Covid) Nasopharyngeal Swab     Status: None   Collection Time: 10/20/22  9:41 AM   Specimen: Nasopharyngeal Swab; Nasal Swab  Result Value Ref Range Status   SARS Coronavirus 2 by RT PCR NEGATIVE NEGATIVE Final    Comment: (NOTE) SARS-CoV-2 target nucleic acids are NOT DETECTED.  The SARS-CoV-2 RNA is generally detectable in upper respiratory specimens during the acute phase of infection. The lowest concentration of SARS-CoV-2 viral copies this assay can detect is 138 copies/mL. A negative result does not preclude SARS-Cov-2 infection and should not be used as the sole basis for treatment or other patient management decisions. A negative result may occur with  improper specimen collection/handling, submission of specimen other than nasopharyngeal swab, presence of viral mutation(s) within the areas targeted by this assay, and inadequate number of viral copies(<138 copies/mL). A negative result must be combined with clinical observations, patient history, and epidemiological information. The expected result is Negative.  Fact Sheet for Patients:  EntrepreneurPulse.com.au  Fact Sheet for Healthcare Providers:  IncredibleEmployment.be  This test is no t yet approved or cleared by the [place] FDA and  has been authorized for detection and/or diagnosis of SARS-CoV-2 by FDA under an Emergency Use Authorization (EUA). This EUA will remain  in effect (meaning this test can be used) for the duration of the COVID-19 declaration under Section 564(b)(1) of the Act, 21 U.S.C.section 360bbb-3(b)(1), unless the authorization is terminated  or revoked sooner.       Influenza A by PCR NEGATIVE  NEGATIVE Final   Influenza B by PCR NEGATIVE NEGATIVE Final    Comment: (NOTE) The Xpert Xpress SARS-CoV-2/FLU/RSV plus assay is intended as an aid in the diagnosis of influenza from Nasopharyngeal swab specimens and should not be used as a sole basis for treatment. Nasal washings and aspirates are unacceptable for Xpert Xpress SARS-CoV-2/FLU/RSV testing.  Fact Sheet for Patients: EntrepreneurPulse.com.au  Fact Sheet for Healthcare Providers: IncredibleEmployment.be  This test is not yet approved or cleared by the [place] FDA and has been authorized for detection and/or diagnosis of SARS-CoV-2 by FDA under an Emergency Use Authorization (EUA). This EUA will remain in effect (meaning this test can be used) for the duration of the COVID-19 declaration under Section 564(b)(1) of the Act, 21 U.S.C. section 360bbb-3(b)(1), unless the authorization is terminated or revoked.  Resp Syncytial Virus by PCR NEGATIVE NEGATIVE Final    Comment: (NOTE) Fact Sheet for Patients: EntrepreneurPulse.com.au  Fact Sheet for Healthcare Providers: IncredibleEmployment.be  This test is not yet approved or cleared by the [place] FDA and has been authorized for detection and/or diagnosis of SARS-CoV-2 by FDA under an Emergency Use Authorization (EUA). This EUA will remain in effect (meaning this test can be used) for the duration of the COVID-19 declaration under Section 564(b)(1) of the Act, 21 U.S.C. section 360bbb-3(b)(1), unless the authorization is terminated or revoked.  Performed at [facility], [address] [place], [place] [address]     Labs: CBC: Recent Labs  Lab 10/20/22 0959 10/21/22 0016  WBC 9.2 10.2  NEUTROABS 7.1  --   HGB 12.4 11.6*  HCT 39.1 36.5  MCV 91.1 91.5  PLT 271 856   Basic Metabolic Panel: Recent Labs  Lab 10/20/22 0959 10/20/22 1301 10/20/22 1358  10/20/22 2128 10/21/22 0016 10/22/22 0026 10/23/22 0018  NA 139 139  --   --  139 139 139  K 4.0 3.7  --  4.3 4.0 3.6 4.3  CL 105 105  --   --  107 105 106  CO2 26 26  --   --  '26 27 26  '$ GLUCOSE 117* 134*  --   --  111* 118* 125*  BUN 17 16  --   --  '16 18 21  '$ CREATININE 0.72 0.78  --   --  0.77 0.95 1.08*  CALCIUM 10.1 9.9  --   --  10.1 10.2 10.6*  MG  --   --  2.3  --  2.4 2.4 2.3   Liver Function Tests: Recent Labs  Lab 10/20/22 0959  AST 29  ALT 39  ALKPHOS 49  BILITOT 0.9  PROT 6.9  ALBUMIN 3.8   CBG: No results for input(s): "GLUCAP" in the last 168 hours.  Discharge time spent: greater than 30 minutes.  Signed: Tawni Millers, MD [facility] 10/23/2022

## 2022-10-23 NOTE — Care Management Important Message (Signed)
Important Message  Patient Details  Name: Loretta Gates MRN: [id number] Date of Birth: 07-24-1936   Medicare Important Message Given:  Yes  Patient left prior to IM delivery will mail to the patient home address.    Anhad Sheeley 10/23/2022, 3:54 PM

## 2022-10-23 NOTE — Progress Notes (Incomplete Revision)
Heart Failure Nurse Navigator Progress Note  PCP: Copland, Gay Filler, MD PCP-Cardiologist: *** Admission Diagnosis: *** Admitted from: ***  Presentation:   Lindell Noe presented with ***  ECHO/ LVEF: ***  Clinical Course:  Past Medical History:  Diagnosis Date   Anemia    Atrial fibrillation (HCC)    on Coumadin   Breast mass, right    Cancer (HCC)    LEFT BREAST-Mastectomy   GERD (gastroesophageal reflux disease)    Hypertension    OAB (overactive bladder)    PONV (postoperative nausea and vomiting)      Social History   Socioeconomic History   Marital status: Married    Spouse name: Not on file   Number of children: Not on file   Years of education: Not on file   Highest education level: Not on file  Occupational History   Not on file  Tobacco Use   Smoking status: Never   Smokeless tobacco: Never  Vaping Use   Vaping Use: Never used  Substance and Sexual Activity   Alcohol use: Never   Drug use: Never   Sexual activity: Not Currently    Birth control/protection: Post-menopausal  Other Topics Concern   Not on file  Social History Narrative   Not on file   Social Determinants of Health   Financial Resource Strain: Low Risk  (01/27/2022)   Overall Financial Resource Strain (CARDIA)    Difficulty of Paying Living Expenses: Not hard at all  Food Insecurity: No Food Insecurity (10/21/2022)   Hunger Vital Sign    Worried About Running Out of Food in the Last Year: Never true    Ran Out of Food in the Last Year: Never true  Transportation Needs: No Transportation Needs (10/21/2022)   PRAPARE - Hydrologist (Medical): No    Lack of Transportation (Non-Medical): No  Physical Activity: Insufficiently Active (01/27/2022)   Exercise Vital Sign    Days of Exercise per Week: 3 days    Minutes of Exercise per Session: 30 min  Stress: No Stress Concern Present (01/27/2022)   [place]    Feeling of Stress : Not at all  Social Connections: [place] (01/27/2022)   Social Connection and Isolation Panel [NHANES]    Frequency of Communication with Friends and Family: More than three times a week    Frequency of Social Gatherings with Friends and Family: More than three times a week    Attends Religious Services: More than 4 times per year    Active Member of [employer] or Organizations: Yes    Attends Music therapist: More than 4 times per year    Marital Status: Married    High Risk Criteria for Readmission and/or Poor Patient Outcomes: Heart failure hospital admissions (last 6 months): ***  No Show rate: *** Difficult social situation: *** Demonstrates medication adherence: *** Primary Language: *** Literacy level: ***  Barriers of Care:   ***  Considerations/Referrals:   Referral made to Heart Failure Pharmacist Stewardship: *** Referral made to Heart Failure CSW/NCM TOC: *** Referral made to Heart & Vascular TOC clinic: ***  Items for Follow-up on DC/TOC: ***   ***      Earnestine Leys, BSN, RN Heart Failure Nurse Navigator Secure Chat Only

## 2022-10-23 NOTE — TOC Benefit Eligibility Note (Signed)
Patient Teacher, English as a foreign language completed.    The patient is currently admitted and upon discharge could be taking Eliquis 5 mg.  The current 30 day co-pay is $47.00.   The patient is insured through [place], [place] Patient Advocate Specialist [place] Patient Advocate Team Direct Number: [phone]  Fax: [phone]

## 2022-10-24 ENCOUNTER — Telehealth: Payer: Self-pay

## 2022-10-24 NOTE — Telephone Encounter (Signed)
Transition Care Management Follow-up Telephone Call Date of discharge and from where: [facility] 10/23/2022 How have you been since you were released from the hospital? weak Any questions or concerns? No  Items Reviewed: Did the pt receive and understand the discharge instructions provided? Yes  Medications obtained and verified? Yes  Other? No  Any new allergies since your discharge? No  Dietary orders reviewed? Yes Do you have support at home? Yes   Home Care and Equipment/Supplies: Were home health services ordered? yes If so, what is the name of the agency? [facility]  Has the agency set up a time to come to the patient's home? no Were any new equipment or medical supplies ordered?  No What is the name of the medical supply agency? N/a Were you able to get the supplies/equipment? no Do you have any questions related to the use of the equipment or supplies? no  Functional Questionnaire: (I = Independent and D = Dependent) ADLs: I  Bathing/Dressing- I  Meal Prep- I  Eating- I  Maintaining continence- I  Transferring/Ambulation- I  Managing Meds- I  Follow up appointments reviewed:  PCP Hospital f/u appt confirmed? Yes  Scheduled to see Dr Lorelei Pont on 10/25/2022 @ 10:00. [facility] f/u appt confirmed? no Patient will scheduled Are transportation arrangements needed? No  If their condition worsens, is the pt aware to call PCP or go to the Emergency Dept.? Yes Was the patient provided with contact information for the PCP's office or ED? Yes Was to pt encouraged to call back with questions or concerns? Yes  Juanda Crumble, LPN [facility] Direct Dial [phone]

## 2022-10-25 ENCOUNTER — Encounter: Payer: Self-pay | Admitting: Family Medicine

## 2022-10-25 ENCOUNTER — Ambulatory Visit (INDEPENDENT_AMBULATORY_CARE_PROVIDER_SITE_OTHER): Payer: Medicare HMO | Admitting: Family Medicine

## 2022-10-25 VITALS — BP 128/72 | HR 70 | Temp 97.5°F | Resp 16 | Ht <= 58 in | Wt 196.0 lb

## 2022-10-25 DIAGNOSIS — I4811 Longstanding persistent atrial fibrillation: Secondary | ICD-10-CM

## 2022-10-25 DIAGNOSIS — Z09 Encounter for follow-up examination after completed treatment for conditions other than malignant neoplasm: Secondary | ICD-10-CM | POA: Diagnosis not present

## 2022-10-25 DIAGNOSIS — I10 Essential (primary) hypertension: Secondary | ICD-10-CM | POA: Diagnosis not present

## 2022-10-25 DIAGNOSIS — I509 Heart failure, unspecified: Secondary | ICD-10-CM

## 2022-10-25 LAB — BASIC METABOLIC PANEL
BUN: 23 mg/dL (ref 6–23)
CO2: 28 mEq/L (ref 19–32)
Calcium: 11 mg/dL — ABNORMAL HIGH (ref 8.4–10.5)
Chloride: 103 mEq/L (ref 96–112)
Creatinine, Ser: 0.77 mg/dL (ref 0.40–1.20)
GFR: 70.77 mL/min (ref >60.00)
Glucose, Bld: 88 mg/dL (ref 70–99)
Potassium: 4.7 mEq/L (ref 3.5–5.1)
Sodium: 139 mEq/L (ref 135–145)

## 2022-10-25 NOTE — Patient Instructions (Addendum)
I am glad you are feeling better and back home!   Continue to weigh yourself daily- if you go up by 2 lbs over night or up by 3-4 lbs in a week please let me or your cardiologist know right away!  If this happens we can adjust your fluid pill  Try to watch your salt - ask restaurants to limit salt in preparing your food and be cautious of prepared foods, esp canned soups and salad dressings

## 2022-10-25 NOTE — Progress Notes (Addendum)
[facility] [address], [place], [place] [address] [phone]  Date:  10/25/2022   Name:  Loretta Gates   DOB:  1936/08/07   MRN:  [id number]  PCP:  Loretta Mclean, MD    Chief Complaint: Follow-up (Follow up)   History of Present Illness:  Loretta Gates is a 87 y.o. very pleasant female patient who presents with the following:  Pt seen today for hospital follow-up Last seen by myself in October for hematuria History of hypertension, hyperparathyroidism status post parathyroidectomy, a fib   She was admitted from 1/12- 1/15 as below with CHF exacerbation, atrial fib with RVR: Recommendations at discharge:   Continue diuresis with daily furosemide, plan to increase to 40 mg daily in case of volume overload, increase 2 to 3 lbs in 24 hrs or 5 lbs in 7 days. Metoprolol has been increased to 50 mg po bid, if continue good toleration as outpatient, recommended to transition to metoprolol succinate.  Patient has been transitioned from warfarin to apixaban for anticoagulation, non valvular atrial fibrillation.  Added empagliflozin for heart failure and continue irbesartan, if remains stable, possible addition of spironolactone as outpatient. Follow up renal function and electrolytes in 7 days. Follow up with Dr Loretta Gates in 7 to 10 days. Follow up with the heart failure clinic.  Out patient PT and OT.    I spoke with patient's daughter at the bedside, we talked in detail about patient's condition, plan of care and prognosis and all questions were addressed. Discharge Diagnoses: Principal Problem:   Acute on chronic diastolic CHF (congestive heart failure) (HCC) Active Problems:   A-fib (HCC)   Essential hypertension   IDA (iron deficiency anemia)   GERD (gastroesophageal reflux disease)   Class 3 obesity [facility]) Hospital Course: Loretta Gates was admitted to the hospital with the working diagnosis of decompensated  heart failure.  87 yo female with the past medical history of paroxysmal atrial fibrillation, hypertension, and obesity who presented with dyspnea. Reported 5 days of worsening dyspnea along with lower extremity edema. Because persistent symptoms and uncontrolled hypertension her family brought her to the ED. On her initial physical examination her blood pressure was 130/74, HR 73, RR 19 and 02 saturation 94%, heart with S1 and S2 present irregularly irregular, respiratory with rales but not wheezing, abdomen with no distention, positive lower extremity edema.   Patient was placed on furosemide for diuresis.  Developed RVR atrial fibrillation and required diltiazem for rate control atrial fibrillation.    01/13 off diltiazem drip, rate control with increased dose of oral metoprolol.  01/14 Patient with improvement in volume status, continue to have episodic RVR with movement.  01/15 atrial fibrillation is rate controlled and now patient is euvolemic. Plan to discharge home and have follow up as outpatient,    Assessment and Plan: * Acute on chronic diastolic CHF (congestive heart failure) (HCC) Echocardiogram preserved LV systolic function with EF 55 to 44%, RV systolic function preserved. RV size with mild enlargement. Mild to moderate TR. RVSP 52,2  Acute on chronic core pulmonale Pulmonary hypertension.  Patient was placed on furosemide for diuresis, negative fluid balance was achieved, - 7,663 ml, weight loss 10.2 Kg, with significant improvement in her symptoms.  Continue heart failure regimen with metoprolol, empaglliflozin and irbesartan.  Diuresis with daily furosemide with instructions to increase to 40 mg in case of volume overload. Follow up with heart failure clinic, possible  starting spironolactone as outpatient.  A-fib [facility]) Patient had improvement in rate control with an increased dose of metoprolol 50 mg po bid. She has been transitioned from warfarin to apixaban for  anticoagulation. (Non valvular atrial fibrillation).  If she continue to have good toleration of current dose of metoprolol, recommended to transition to metoprolol succinate.  Essential hypertension Continue blood pressure control with metoprolol, irbesartan and diuretic therapy with furosemide.  IDA (iron deficiency anemia) Patient getting IV iron as outpatient.  GERD (gastroesophageal reflux disease) Continue with pantoprazole.  Class 3 obesity (HCC) Calculated BMI is 44.1   Pt notes she was not feeling well for several days- she was having some difficulty breathing She notes she was feeing bad for maybe 2 weeks and gradually getting worse  Patient admits she resisted going to the hospital probably too long.  She will be more conservative next time.  Her daughter is with her, she is very involved in helping out her mom.  They are making plans to reduce salt intake  Wt Readings from Last 3 Encounters:  10/25/22 196 lb (88.9 kg)  10/23/22 192 lb 7.4 oz (87.3 kg)  07/12/22 199 lb 12.8 oz (90.6 kg)   Pt notes her home weight was 187 this morning. Her max weight was over 200 prior to admission She is continuing to weigh herself on her home scale every morning  They did go to urology to check on the gross hematuria she had last year -her workup is in progress Patient Active Problem List   Diagnosis Date Noted   Class 3 obesity ([place]) 10/21/2022   A-fib ([place]) 10/20/2022   Acute on chronic diastolic CHF (congestive heart failure) ([place]) 10/20/2022   IDA (iron deficiency anemia) 01/13/2022   Atrial fibrillation ([place]) 01/05/2022   Breast cancer ([place]) 01/05/2022   Primary hyperparathyroidism ([place]) 01/05/2022   Essential hypertension 01/05/2022   Age-related nuclear cataract of both eyes 12/11/2018   Abnormal INR 01/29/2018   Long term (current) use of anticoagulants 01/29/2018   Encounter for therapeutic drug monitoring 01/29/2018   Vitamin D deficiency 05/03/2017   S/P laparoscopic  cholecystectomy 02/26/2017   Calculus of gallbladder without cholecystitis without obstruction 02/23/2017   Diarrhea of presumed infectious origin 02/23/2017   Hyperglycemia 02/23/2017   Right upper quadrant abdominal pain 02/23/2017   Localized edema 01/18/2016   Age-related osteoporosis without current pathological fracture 01/17/2016   Diverticulosis of large intestine without hemorrhage 01/17/2016   GERD (gastroesophageal reflux disease) 01/17/2016   History of left breast cancer 01/17/2016   Multinodular goiter 01/17/2016   OAB (overactive bladder) 01/17/2016   History of cancer chemotherapy 03/06/2015   History of left mastectomy 03/06/2015   Malignant neoplasm of overlapping sites of left female breast ([place]) 03/06/2015    Past Medical History:  Diagnosis Date   Anemia    Atrial fibrillation ([place])    on Coumadin   Breast mass, right    Cancer (HCC)    LEFT BREAST-Mastectomy   GERD (gastroesophageal reflux disease)    Hypertension    OAB (overactive bladder)    PONV (postoperative nausea and vomiting)     Past Surgical History:  Procedure Laterality Date   BREAST LUMPECTOMY WITH RADIOACTIVE SEED LOCALIZATION Right 10/25/2021   Procedure: RIGHT BREAST LUMPECTOMY WITH RADIOACTIVE SEED LOCALIZATION;  Surgeon: Erroll Luna, MD;  Location: [facility];  Service: General;  Laterality: Right;   CHOLECYSTECTOMY     MASTECTOMY Left 2010   OVARIAN CYST REMOVAL  PARATHYROIDECTOMY      Social History   Tobacco Use   Smoking status: Never   Smokeless tobacco: Never  Vaping Use   Vaping Use: Never used  Substance Use Topics   Alcohol use: Never   Drug use: Never    No family history on file.  Allergies  Allergen Reactions   Bee Venom Swelling    Bee stung her on the inside of her gum. Lips and gums started swelling. No SOB.   Levaquin [Levofloxacin] Hives and Itching   Peanut-Containing Drug Products Other (See Comments)    Stomach pain and  diarrhea     Medication list has been reviewed and updated.  Current Outpatient Medications on File Prior to Visit  Medication Sig Dispense Refill   apixaban (ELIQUIS) 5 MG TABS tablet Take 1 tablet (5 mg total) by mouth 2 (two) times daily. 60 tablet 0   cholestyramine (QUESTRAN) 4 GM/DOSE powder Take 4 g by mouth See admin instructions. Mix 1 teaspoon in water and take at 6pm daily with warfarin.     denosumab (PROLIA) 60 MG/ML SOSY injection Inject 60 mg into the skin every 6 (six) months.     empagliflozin (JARDIANCE) 10 MG TABS tablet Take 1 tablet (10 mg total) by mouth daily. 30 tablet 0   ergocalciferol (VITAMIN D2) 1.25 MG (50000 UT) capsule Take 50,000 Units by mouth every 14 (fourteen) days. Every other saturday     furosemide (LASIX) 20 MG tablet Take 1 tablet (20 mg total) by mouth daily. 30 tablet 0   irbesartan (AVAPRO) 300 MG tablet Take 1 tablet (300 mg total) by mouth daily. 90 tablet 1   magnesium oxide (MAG-OX) 400 (240 Mg) MG tablet Take 400 mg by mouth daily.     metoprolol tartrate (LOPRESSOR) 50 MG tablet Take 1 tablet (50 mg total) by mouth 2 (two) times daily. 60 tablet 0   omeprazole (PRILOSEC) 40 MG capsule Take 1 capsule (40 mg total) by mouth 2 (two) times daily before a meal. 180 capsule 1   oxybutynin (DITROPAN XL) 15 MG 24 hr tablet Take 1 tablet (15 mg total) by mouth at bedtime. 90 tablet 3   No current facility-administered medications on file prior to visit.    Review of Systems:  As per HPI- otherwise negative.   Physical Examination: Vitals:   10/25/22 0955  BP: 128/72  Pulse: 70  Resp: 16  Temp: (!) 97.5 F (36.4 C)  SpO2: 93%   Vitals:   10/25/22 0955  Weight: 196 lb (88.9 kg)  Height: '4\' 10"'$  (1.473 m)   Body mass index is 40.96 kg/m. Ideal Body Weight: Weight in (lb) to have BMI = 25: 119.4  GEN: no acute distress.  Obese, looks well HEENT: Atraumatic, Normocephalic.  Ears and Nose: No external deformity. CV: RRR, No M/G/R. No  JVD. No thrill. No extra heart sounds. PULM: CTA B, no wheezes, crackles, rhonchi. No retractions. No resp. distress. No accessory muscle use. ABD: S, NT, ND, +BS. No rebound. No HSM. EXTR: No c/c/e PSYCH: Normally interactive. Conversant.  She has minimal edema both lower extremities  Assessment and Plan: Acute on chronic congestive heart failure, unspecified heart failure type [facility])  Essential hypertension - Plan: Basic metabolic panel  Hospital discharge follow-up  Longstanding persistent atrial fibrillation [facility])  Patient seen today for follow-up from recent admission due to heart failure exacerbation, A-fib with RVR.  She is anticoagulated now with Eliquis, changed over from Coumadin She is tolerating this  fine She continues to be in atrial for but her rate is now controlled.  She is taking higher dose of metoprolol.  She admits she may sometimes feel little tired or dizzy, we will have her maintain her current dose of the beta-blocker for now but she will discuss further with cardiology at upcoming visit  Discussed some strategies to prevent another severe heart failure exacerbation.  She will weigh herself daily and report smaller changes so I can adjust her fluid pill if needed.  Generally she is taking furosemide 20 mg daily, at this time will not start a daily potassium supplement but did encourage her to consume high potassium food daily-gave her a list  If her furosemide needs go up we may need to add potassium  Plan to recheck with me in about 4 months Signed Lamar Blinks, MD  Received her labs as below, message to patient  Results for orders placed or performed in visit on 83/38/25  Basic metabolic panel  Result Value Ref Range   Sodium 139 135 - 145 mEq/L   Potassium 4.7 3.5 - 5.1 mEq/L   Chloride 103 96 - 112 mEq/L   CO2 28 19 - 32 mEq/L   Glucose, Bld 88 70 - 99 mg/dL   BUN 23 6 - 23 mg/dL   Creatinine, Ser 0.77 0.40 - 1.20 mg/dL   GFR 70.77 >60.00 mL/min    Calcium 11.0 (H) 8.4 - 10.5 mg/dL

## 2022-10-27 ENCOUNTER — Inpatient Hospital Stay: Payer: Medicare HMO

## 2022-10-27 ENCOUNTER — Telehealth: Payer: Self-pay | Admitting: Family Medicine

## 2022-10-27 VITALS — BP 112/60 | HR 78 | Temp 98.0°F | Resp 18

## 2022-10-27 DIAGNOSIS — D509 Iron deficiency anemia, unspecified: Secondary | ICD-10-CM

## 2022-10-27 MED ORDER — SODIUM CHLORIDE 0.9 % IV SOLN: Freq: Once | INTRAVENOUS | Status: AC

## 2022-10-27 MED ORDER — SODIUM CHLORIDE 0.9% FLUSH: 3.0000 mL | Freq: Once | INTRAVENOUS | Status: DC | PRN

## 2022-10-27 MED ORDER — SODIUM CHLORIDE 0.9% FLUSH: 10.0000 mL | Freq: Once | INTRAVENOUS | Status: DC | PRN

## 2022-10-27 MED ORDER — SODIUM CHLORIDE 0.9 % IV SOLN
125.0000 mg | Freq: Once | INTRAVENOUS | Status: AC
  Administered 2022-10-27: 125 mg via INTRAVENOUS
  Filled 2022-10-27: qty 125

## 2022-10-27 NOTE — Patient Instructions (Signed)
Sodium Ferric Gluconate Complex Injection What is this medication? SODIUM FERRIC GLUCONATE COMPLEX (SOE dee um FER ik GLOO koe nate KOM pleks) treats low levels of iron (iron deficiency anemia) in people with kidney disease. Iron is a mineral that plays an important role in making red blood cells, which carry oxygen from your lungs to the rest of your body. This medicine may be used for other purposes; ask your health care provider or pharmacist if you have questions. COMMON BRAND NAME(S): Ferrlecit, Nulecit What should I tell my care team before I take this medication? They need to know if you have any of the following conditions: Anemia that is not from iron deficiency High levels of iron in the blood An unusual or allergic reaction to iron, other medications, foods, dyes, or preservatives Pregnant or are trying to become pregnant Breast-feeding How should I use this medication? This medication is injected into a vein. It is given by your care team in a hospital or clinic setting. Talk to your care team about the use of this medication in children. While it may be prescribed for children as young as 6 years for selected conditions, precautions do apply. Overdosage: If you think you have taken too much of this medicine contact a poison control center or emergency room at once. NOTE: This medicine is only for you. Do not share this medicine with others. What if I miss a dose? It is important not to miss your dose. Call your care team if you are unable to keep an appointment. What may interact with this medication? Do not take this medication with any of the following: Deferasirox Deferoxamine Dimercaprol This medication may also interact with the following: Other iron products This list may not describe all possible interactions. Give your health care provider a list of all the medicines, herbs, non-prescription drugs, or dietary supplements you use. Also tell them if you smoke, drink  alcohol, or use illegal drugs. Some items may interact with your medicine. What should I watch for while using this medication? Your condition will be monitored carefully while you are receiving this medication. Visit your care team for regular checks on your progress. You may need blood work while you are taking this medication. What side effects may I notice from receiving this medication? Side effects that you should report to your care team as soon as possible: Allergic reactions--skin rash, itching, hives, swelling of the face, lips, tongue, or throat Low blood pressure--dizziness, feeling faint or lightheaded, blurry vision Shortness of breath Side effects that usually do not require medical attention (report to your care team if they continue or are bothersome): Flushing Headache Joint pain Muscle pain Nausea Pain, redness, or irritation at injection site This list may not describe all possible side effects. Call your doctor for medical advice about side effects. You may report side effects to FDA at 1-800-FDA-1088. Where should I keep my medication? This medication is given in a hospital or clinic and will not be stored at home. NOTE: This sheet is a summary. It may not cover all possible information. If you have questions about this medicine, talk to your doctor, pharmacist, or health care provider.  2023 Elsevier/Gold Standard (2007-11-16 00:00:00) 

## 2022-10-27 NOTE — Telephone Encounter (Signed)
Called her endocrinology office at [facility] and asked staff to send a message to her endocrinologist, Dr. Posey Pronto.  Letting them know that calcium was moderately elevated at 11, left myself a number in case any further intervention on my part is needed

## 2022-10-31 ENCOUNTER — Telehealth (HOSPITAL_COMMUNITY): Payer: Self-pay

## 2022-10-31 NOTE — Telephone Encounter (Signed)
Called to confirm Heart & Vascular Transitions of Care appointment at 11/01/22. Patient reminded to bring all medications and pill box organizer with them. Gave directions, instructed to utilize [facility] [address] parking.  Left message to confirm appointment.

## 2022-11-01 ENCOUNTER — Ambulatory Visit (HOSPITAL_COMMUNITY)
Admission: RE | Admit: 2022-11-01 | Discharge: 2022-11-01 | Disposition: A | Discharge status: Home / Self Care | Payer: Medicare HMO | Source: Ambulatory Visit | Attending: Cardiology | Admitting: Cardiology

## 2022-11-01 ENCOUNTER — Encounter (HOSPITAL_COMMUNITY): Payer: Self-pay

## 2022-11-01 ENCOUNTER — Other Ambulatory Visit: Payer: Self-pay

## 2022-11-01 ENCOUNTER — Other Ambulatory Visit (HOSPITAL_COMMUNITY): Payer: Self-pay

## 2022-11-01 VITALS — BP 128/80 | HR 82 | Ht <= 58 in | Wt 197.6 lb

## 2022-11-01 DIAGNOSIS — E213 Hyperparathyroidism, unspecified: Secondary | ICD-10-CM | POA: Insufficient documentation

## 2022-11-01 DIAGNOSIS — I5030 Unspecified diastolic (congestive) heart failure: Secondary | ICD-10-CM

## 2022-11-01 DIAGNOSIS — E669 Obesity, unspecified: Secondary | ICD-10-CM | POA: Insufficient documentation

## 2022-11-01 DIAGNOSIS — Z79899 Other long term (current) drug therapy: Secondary | ICD-10-CM | POA: Insufficient documentation

## 2022-11-01 DIAGNOSIS — I110 Hypertensive heart disease with heart failure: Secondary | ICD-10-CM | POA: Diagnosis not present

## 2022-11-01 DIAGNOSIS — Z7901 Long term (current) use of anticoagulants: Secondary | ICD-10-CM | POA: Insufficient documentation

## 2022-11-01 DIAGNOSIS — I5033 Acute on chronic diastolic (congestive) heart failure: Secondary | ICD-10-CM | POA: Diagnosis not present

## 2022-11-01 DIAGNOSIS — Z7984 Long term (current) use of oral hypoglycemic drugs: Secondary | ICD-10-CM | POA: Diagnosis not present

## 2022-11-01 DIAGNOSIS — I4819 Other persistent atrial fibrillation: Secondary | ICD-10-CM | POA: Insufficient documentation

## 2022-11-01 DIAGNOSIS — I480 Paroxysmal atrial fibrillation: Secondary | ICD-10-CM

## 2022-11-01 NOTE — Progress Notes (Signed)
HEART & VASCULAR TRANSITION OF CARE CONSULT NOTE     Referring Physician: Dr. Cathlean Sauer  Primary Care: Copland, Gay Filler, MD Primary Cardiologist: [facility]    HPI: Referred to clinic by Dr. Cathlean Sauer for heart failure consultation.   87 y/o female w/ h/o PAF, previously on Warfarin, HFpEF, HTN, obesity and hyperparathyroidism s/p parathyroidectomy. Has received previous cardiac care at [facility].   Recent admit to [facility] 1/24 for acute on chronic diastolic heart failure. Was in sinus brady at time of admit but went in to AFib w/ RVR during stay. Echo showed normal LVEF 55-60%, RV mildly enlarged w/ moderately elevated pulmonary artery systolic pressure, 42.6 mmHg, but normal RV systolic fx. + mild RAE and mod LAE, only mild MR.     She Diuresed w/ IV Lasix and placed on Dilt gtt for rate controlled. Diuresed well. Remained in afib w/ improved rate control. Transitioned off dilt gtt and home metoprolol increased. Transitioned from warfarin to Eliquis per pt request. Transitioned to PO Lasix. Discharged home on 1/15. D/c wt 192 lb. Referred to [facility] clinic.   She presents today for f/u. Here w/ daughter. Reports doing well since d/c. Checking wt at home daily, wt stable since d/c. Compliant w/ meds. Tolerating well w/o side effects. Denies abnormal bleeding w/ Eliquis. No resting dyspnea. + chronic LEE that she has had for years since her 66s (suspect venous insuffiencey given presence of varicose veins).  EKG shows persistent atrial fibrillation, rate controlled 80 bpm. She is asymptomatic. BP well controlled.     Cardiac Testing   2D Echo 10/21/22 1. Left ventricular ejection fraction, by estimation, is 55 to 60%. The  left ventricle has normal function. The left ventricle has no regional  wall motion abnormalities. Left ventricular diastolic function could not  be evaluated.   2. Right ventricular systolic function is normal. The right ventricular  size is mildly enlarged. There is moderately  elevated pulmonary artery  systolic pressure.   3. Left atrial size was moderately dilated.   4. Right atrial size was mildly dilated.   5. The mitral valve is normal in structure. Mild mitral valve  regurgitation. No evidence of mitral stenosis.   6. Tricuspid valve regurgitation is mild to moderate.   7. The aortic valve is normal in structure. Aortic valve regurgitation is  not visualized. No aortic stenosis is present.   8. The inferior vena cava is dilated in size with <50% respiratory  variability, suggesting right atrial pressure of 15 mmHg.     Review of Systems: [y] = yes, '[ ]'$  = no   General: Weight gain '[ ]'$ ; Weight loss '[ ]'$ ; Anorexia '[ ]'$ ; Fatigue '[ ]'$ ; Fever '[ ]'$ ; Chills '[ ]'$ ; Weakness '[ ]'$   Cardiac: Chest pain/pressure '[ ]'$ ; Resting SOB '[ ]'$ ; Exertional SOB '[ ]'$ ; Orthopnea '[ ]'$ ; Pedal Edema [ Y]; Palpitations '[ ]'$ ; Syncope '[ ]'$ ; Presyncope '[ ]'$ ; Paroxysmal nocturnal dyspnea'[ ]'$   Pulmonary: Cough '[ ]'$ ; Wheezing'[ ]'$ ; Hemoptysis'[ ]'$ ; Sputum '[ ]'$ ; Snoring '[ ]'$   GI: Vomiting'[ ]'$ ; Dysphagia'[ ]'$ ; Melena'[ ]'$ ; Hematochezia '[ ]'$ ; Heartburn'[ ]'$ ; Abdominal pain '[ ]'$ ; Constipation '[ ]'$ ; Diarrhea '[ ]'$ ; BRBPR '[ ]'$   GU: Hematuria'[ ]'$ ; Dysuria '[ ]'$ ; Nocturia'[ ]'$   Vascular: Pain in legs with walking '[ ]'$ ; Pain in feet with lying flat '[ ]'$ ; Non-healing sores '[ ]'$ ; Stroke '[ ]'$ ; TIA '[ ]'$ ; Slurred speech '[ ]'$ ;  Neuro: Headaches'[ ]'$ ; Vertigo'[ ]'$ ; Seizures'[ ]'$ ; Paresthesias'[ ]'$ ;Blurred vision '[ ]'$ ; Diplopia '[ ]'$ ;  Vision changes '[ ]'$   Ortho/Skin: Arthritis '[ ]'$ ; Joint pain '[ ]'$ ; Muscle pain '[ ]'$ ; Joint swelling '[ ]'$ ; Back Pain '[ ]'$ ; Rash '[ ]'$   Psych: Depression'[ ]'$ ; Anxiety'[ ]'$   Heme: Bleeding problems '[ ]'$ ; Clotting disorders '[ ]'$ ; Anemia '[ ]'$   Endocrine: Diabetes '[ ]'$ ; Thyroid dysfunction'[ ]'$    Past Medical History:  Diagnosis Date   Anemia    Atrial fibrillation (HCC)    on Coumadin   Breast mass, right    Cancer (HCC)    LEFT BREAST-Mastectomy   GERD (gastroesophageal reflux disease)    Hypertension    OAB (overactive bladder)    PONV  (postoperative nausea and vomiting)     Current Outpatient Medications  Medication Sig Dispense Refill   apixaban (ELIQUIS) 5 MG TABS tablet Take 1 tablet (5 mg total) by mouth 2 (two) times daily. 60 tablet 0   cholestyramine (QUESTRAN) 4 GM/DOSE powder Take 4 g by mouth See admin instructions. Mix 1 teaspoon in water and take at 6pm daily with warfarin.     denosumab (PROLIA) 60 MG/ML SOSY injection Inject 60 mg into the skin every 6 (six) months.     empagliflozin (JARDIANCE) 10 MG TABS tablet Take 1 tablet (10 mg total) by mouth daily. 30 tablet 0   ergocalciferol (VITAMIN D2) 1.25 MG (50000 UT) capsule Take 50,000 Units by mouth every 14 (fourteen) days. Every other saturday     furosemide (LASIX) 20 MG tablet Take 1 tablet (20 mg total) by mouth daily. (Patient taking differently: Take 20 mg by mouth daily. Patient taking 20 mg morning and evening) 30 tablet 0   irbesartan (AVAPRO) 300 MG tablet Take 1 tablet (300 mg total) by mouth daily. 90 tablet 1   magnesium oxide (MAG-OX) 400 (240 Mg) MG tablet Take 400 mg by mouth daily. Taking 500 mg daily     metoprolol tartrate (LOPRESSOR) 50 MG tablet Take 1 tablet (50 mg total) by mouth 2 (two) times daily. 60 tablet 0   omeprazole (PRILOSEC) 40 MG capsule Take 1 capsule (40 mg total) by mouth 2 (two) times daily before a meal. 180 capsule 1   oxybutynin (DITROPAN XL) 15 MG 24 hr tablet Take 1 tablet (15 mg total) by mouth at bedtime. 90 tablet 3   No current facility-administered medications for this encounter.    Allergies  Allergen Reactions   Bee Venom Swelling    Bee stung her on the inside of her gum. Lips and gums started swelling. No SOB.   Levaquin [Levofloxacin] Hives and Itching   Peanut-Containing Drug Products Other (See Comments)    Stomach pain and diarrhea       Social History   Socioeconomic History   Marital status: Married    Spouse name: Not on file   Number of children: Not on file   Years of education: Not  on file   Highest education level: Not on file  Occupational History   Not on file  Tobacco Use   Smoking status: Never   Smokeless tobacco: Never  Vaping Use   Vaping Use: Never used  Substance and Sexual Activity   Alcohol use: Never   Drug use: Never   Sexual activity: Not Currently    Birth control/protection: Post-menopausal  Other Topics Concern   Not on file  Social History Narrative   Not on file   Social Determinants of Health   Financial Resource Strain: Low Risk  (01/27/2022)   Overall Financial Resource Strain (  CARDIA)    Difficulty of Paying Living Expenses: Not hard at all  Food Insecurity: No Food Insecurity (10/21/2022)   Hunger Vital Sign    Worried About Running Out of Food in the Last Year: Never true    Ran Out of Food in the Last Year: Never true  Transportation Needs: No Transportation Needs (10/21/2022)   PRAPARE - Hydrologist (Medical): No    Lack of Transportation (Non-Medical): No  Physical Activity: Insufficiently Active (01/27/2022)   Exercise Vital Sign    Days of Exercise per Week: 3 days    Minutes of Exercise per Session: 30 min  Stress: No Stress Concern Present (01/27/2022)   [place]    Feeling of Stress : Not at all  Social Connections: [place] (01/27/2022)   Social Connection and Isolation Panel [NHANES]    Frequency of Communication with Friends and Family: More than three times a week    Frequency of Social Gatherings with Friends and Family: More than three times a week    Attends Religious Services: More than 4 times per year    Active Member of [employer] or Organizations: Yes    Attends Music therapist: More than 4 times per year    Marital Status: Married  Human resources officer Violence: Not At Risk (10/21/2022)   Humiliation, Afraid, Rape, and Kick questionnaire    Fear of Current or Ex-Partner: No    Emotionally  Abused: No    Physically Abused: No    Sexually Abused: No      Family History  Problem Relation Age of Onset   Sudden death Neg Hx     Vitals:   11-16-2022 1020 Nov 16, 2022 1030  BP: 128/80 128/80  Pulse: 82   SpO2: 98%   Weight: 89.6 kg (197 lb 9.6 oz)   Height: '4\' 10"'$  (1.473 m)     PHYSICAL EXAM: General:  Well appearing. No respiratory difficulty HEENT: normal Neck: supple. no JVD. Carotids 2+ bilat; no bruits. No lymphadenopathy or thryomegaly appreciated. Cor: PMI nondisplaced. Irregularly irregular rhythm and rate. No rubs, gallops or murmurs. Lungs: clear Abdomen: soft, nontender, nondistended. No hepatosplenomegaly. No bruits or masses. Good bowel sounds. Extremities: no cyanosis, clubbing, rash, trace b/l LE edema Neuro: alert & oriented x 3, cranial nerves grossly intact. moves all 4 extremities w/o difficulty. Affect pleasant.  ECG: Atrial fibrillation 80 bpm    ASSESSMENT & PLAN:  1. HFpEF  - Echo Nov 16, 2022 55-60%, RV mildly enlarged w/ moderately elevated pulmonary artery systolic pressure, 65.6 mmHg, but normal RV systolic fx.  - NYHA II, confounded by obesity and deconditioning. Wt stable since discharge, suspect LEE 2/2 chronic venous insuffiencey  - Continue Lasix 20 mg bid - Continue Jardiance 10 mg daily  - PCP checked labs post hospital. I have personally reviewed results. SCr and K stable/WNL  - discussed daily wts. Advised to take extra lasix tablet PRN based on > 3 lb wt gain in 24 hr or > 5 lb in 1 wk    2. PAF - EKG today shows persistent atrial fibrillation, 80 bpm. Asymptomatic   - Echo EF 55-60%, mild RAE and mod LAE, only mild MR - Continue Metoprolol for rate control  - on Eliquis 5 mg bid. Denies abnormal bleeding. Labs followed by [facility] (gets periodic iron infusions for IDA, last Hgb 11.6)  - Continue rate control strategy for now. Refer to cardiology for further  management. May consider DCCV after 3 uninterrupted wks of Eliquis if Afib  persists   3. Hypertension  - controlled on current regimen - no change    4. Hyperparathyroidism  - s/p parathyroidectomy  5. Chronic Venous Insuffiencey  - Rx given for TED hoses     Referred to HFSW (PCP, Medications, Transportation, ETOH Abuse, Drug Abuse, Insurance, Financial ): No  Refer to Pharmacy: No  Refer to Home Health: No  Refer to Advanced Heart Failure Clinic: No  Refer to General Cardiology: Yes   Follow up: pt previously followed at [facility]. She wants to transition all of her care to [facility]. Will refer to [facility] in [facility] for further management of HFpEF and Atrial fibrillation

## 2022-11-01 NOTE — Patient Instructions (Addendum)
Prescription for TED hose given to you. Wear as directed. We will send a referral to Dr. Kirk Ruths at [facility] in [facility]. His office will call you with appointment.

## 2022-11-02 ENCOUNTER — Inpatient Hospital Stay: Payer: Medicare HMO | Admitting: Family Medicine

## 2022-11-06 ENCOUNTER — Encounter: Payer: Self-pay | Admitting: Family Medicine

## 2022-11-06 MED ORDER — CHOLESTYRAMINE 4 GM/DOSE PO POWD: 4.0000 g | ORAL | 3 refills | Status: DC

## 2022-11-07 DIAGNOSIS — E559 Vitamin D deficiency, unspecified: Secondary | ICD-10-CM | POA: Diagnosis not present

## 2022-11-07 DIAGNOSIS — R7989 Other specified abnormal findings of blood chemistry: Secondary | ICD-10-CM | POA: Diagnosis not present

## 2022-11-07 DIAGNOSIS — Z7689 Persons encountering health services in other specified circumstances: Secondary | ICD-10-CM | POA: Diagnosis not present

## 2022-11-07 DIAGNOSIS — M810 Age-related osteoporosis without current pathological fracture: Secondary | ICD-10-CM | POA: Diagnosis not present

## 2022-11-07 DIAGNOSIS — E213 Hyperparathyroidism, unspecified: Secondary | ICD-10-CM | POA: Diagnosis not present

## 2022-11-07 NOTE — Therapy (Incomplete)
OUTPATIENT PHYSICAL THERAPY NEURO EVALUATION   Patient Name: Loretta Gates MRN: [id number] DOB:March 09, 1936, 87 y.o., female Today's Date: 11/07/2022   PCP: Lamar Blinks REFERRING PROVIDER: Sander Radon  END OF SESSION:   Past Medical History:  Diagnosis Date   Anemia    Atrial fibrillation ([place])    on Coumadin   Breast mass, right    Cancer ([place])    LEFT BREAST-Mastectomy   GERD (gastroesophageal reflux disease)    Hypertension    OAB (overactive bladder)    PONV (postoperative nausea and vomiting)    Past Surgical History:  Procedure Laterality Date   BREAST LUMPECTOMY WITH RADIOACTIVE SEED LOCALIZATION Right 10/25/2021   Procedure: RIGHT BREAST LUMPECTOMY WITH RADIOACTIVE SEED LOCALIZATION;  Surgeon: Erroll Luna, MD;  Location: [place];  Service: General;  Laterality: Right;   CHOLECYSTECTOMY     MASTECTOMY Left 2010   OVARIAN CYST REMOVAL     PARATHYROIDECTOMY     Patient Active Problem List   Diagnosis Date Noted   Class 3 obesity ([place]) 10/21/2022   A-fib ([place]) 10/20/2022   Acute on chronic diastolic CHF (congestive heart failure) ([place]) 10/20/2022   IDA (iron deficiency anemia) 01/13/2022   Atrial fibrillation ([place]) 01/05/2022   Breast cancer ([place]) 01/05/2022   Primary hyperparathyroidism ([place]) 01/05/2022   Essential hypertension 01/05/2022   Age-related nuclear cataract of both eyes 12/11/2018   Abnormal INR 01/29/2018   Long term (current) use of anticoagulants 01/29/2018   Encounter for therapeutic drug monitoring 01/29/2018   Vitamin D deficiency 05/03/2017   S/P laparoscopic cholecystectomy 02/26/2017   Calculus of gallbladder without cholecystitis without obstruction 02/23/2017   Diarrhea of presumed infectious origin 02/23/2017   Hyperglycemia 02/23/2017   Right upper quadrant abdominal pain 02/23/2017   Localized edema 01/18/2016   Age-related osteoporosis without current pathological fracture 01/17/2016    Diverticulosis of large intestine without hemorrhage 01/17/2016   GERD (gastroesophageal reflux disease) 01/17/2016   History of left breast cancer 01/17/2016   Multinodular goiter 01/17/2016   OAB (overactive bladder) 01/17/2016   History of cancer chemotherapy 03/06/2015   History of left mastectomy 03/06/2015   Malignant neoplasm of overlapping sites of left female breast ([place]) 03/06/2015    ONSET DATE: 10/23/22  REFERRING DIAG: Unsteadiness on feet, difficulty walking (R26.81, R26.2)  THERAPY DIAG:  No diagnosis found.  Rationale for Evaluation and Treatment: Rehabilitation  SUBJECTIVE:                                                                                                                                                                                             SUBJECTIVE  STATEMENT: *** Pt accompanied by: {accompnied:27141}  PERTINENT HISTORY: A-fib, breast cancer left 2023, CHF, obesity   PAIN:  Are you having pain? {OPRCPAIN:27236}  PRECAUTIONS: {Therapy precautions:24002}  WEIGHT BEARING RESTRICTIONS: {Yes ***/No:24003}  FALLS: Has patient fallen in last 6 months? {fallsyesno:27318}  LIVING ENVIRONMENT: Lives with: {OPRC lives with:25569::"lives with their family"} Lives in: {Lives in:25570} Stairs: {opstairs:27293} Has following equipment at home: {Assistive devices:23999}  PLOF: {PLOF:24004}  PATIENT GOALS: ***  OBJECTIVE:   DIAGNOSTIC FINDINGS: ***  COGNITION: Overall cognitive status: {cognition:24006}   SENSATION: {sensation:27233}  COORDINATION: ***  EDEMA:  {edema:24020}  MUSCLE TONE: {LE tone:25568}  MUSCLE LENGTH: Hamstrings: Right *** deg; Left *** deg Thomas test: Right *** deg; Left *** deg  DTRs:  {DTR SITE:24025}  POSTURE: {posture:25561}  LOWER EXTREMITY ROM:     {AROM/PROM:27142}  Right Eval Left Eval  Hip flexion    Hip extension    Hip abduction    Hip adduction    Hip internal rotation    Hip  external rotation    Knee flexion    Knee extension    Ankle dorsiflexion    Ankle plantarflexion    Ankle inversion    Ankle eversion     (Blank rows = not tested)  LOWER EXTREMITY MMT:    MMT Right Eval Left Eval  Hip flexion    Hip extension    Hip abduction    Hip adduction    Hip internal rotation    Hip external rotation    Knee flexion    Knee extension    Ankle dorsiflexion    Ankle plantarflexion    Ankle inversion    Ankle eversion    (Blank rows = not tested)  BED MOBILITY:  {Bed mobility:24027}  TRANSFERS: Assistive device utilized: {Assistive devices:23999}  Sit to stand: {Levels of assistance:24026} Stand to sit: {Levels of assistance:24026} Chair to chair: {Levels of assistance:24026} Floor: {Levels of assistance:24026}  RAMP:  Level of Assistance: {Levels of assistance:24026} Assistive device utilized: {Assistive devices:23999} Ramp Comments: ***  CURB:  Level of Assistance: {Levels of assistance:24026} Assistive device utilized: {Assistive devices:23999} Curb Comments: ***  STAIRS: Level of Assistance: {Levels of assistance:24026} Stair Negotiation Technique: {Stair Technique:27161} with {Rail Assistance:27162} Number of Stairs: ***  Height of Stairs: ***  Comments: ***  GAIT: Gait pattern: {gait characteristics:25376} Distance walked: *** Assistive device utilized: {Assistive devices:23999} Level of assistance: {Levels of assistance:24026} Comments: ***  FUNCTIONAL TESTS:  {Functional tests:24029}  PATIENT SURVEYS:  {rehab surveys:24030}  TODAY'S TREATMENT:                                                                                                                              DATE: ***    PATIENT EDUCATION: Education details: *** Person educated: {Person educated:25204} Education method: {Education Method:25205} Education comprehension: {Education Comprehension:25206}  HOME EXERCISE PROGRAM: ***  GOALS: Goals  reviewed with patient? {yes/no:20286}  SHORT TERM GOALS: Target date: ***  *** Baseline:  Goal status: {GOALSTATUS:25110}  2.  *** Baseline:  Goal status: {GOALSTATUS:25110}  3.  *** Baseline:  Goal status: {GOALSTATUS:25110}  4.  *** Baseline:  Goal status: {GOALSTATUS:25110}  5.  *** Baseline:  Goal status: {GOALSTATUS:25110}  6.  *** Baseline:  Goal status: {GOALSTATUS:25110}  LONG TERM GOALS: Target date: ***  *** Baseline:  Goal status: {GOALSTATUS:25110}  2.  *** Baseline:  Goal status: {GOALSTATUS:25110}  3.  *** Baseline:  Goal status: {GOALSTATUS:25110}  4.  *** Baseline:  Goal status: {GOALSTATUS:25110}  5.  *** Baseline:  Goal status: {GOALSTATUS:25110}  6.  *** Baseline:  Goal status: {GOALSTATUS:25110}  ASSESSMENT:  CLINICAL IMPRESSION: Patient is a *** y.o. *** who was seen today for physical therapy evaluation and treatment for ***.   OBJECTIVE IMPAIRMENTS: {opptimpairments:25111}.   ACTIVITY LIMITATIONS: {activitylimitations:27494}  PARTICIPATION LIMITATIONS: {participationrestrictions:25113}  PERSONAL FACTORS: {Personal factors:25162} are also affecting patient's functional outcome.   REHAB POTENTIAL: {rehabpotential:25112}  CLINICAL DECISION MAKING: {clinical decision making:25114}  EVALUATION COMPLEXITY: {Evaluation complexity:25115}  PLAN:  PT FREQUENCY: {rehab frequency:25116}  PT DURATION: {rehab duration:25117}  PLANNED INTERVENTIONS: {rehab planned interventions:25118::"Therapeutic exercises","Therapeutic activity","Neuromuscular re-education","Balance training","Gait training","Patient/Family education","Self Care","Joint mobilization"}  PLAN FOR NEXT SESSION: ***   Andris Baumann, PT 11/07/2022, 11:58 AM

## 2022-11-08 ENCOUNTER — Ambulatory Visit: Payer: Medicare HMO

## 2022-11-09 ENCOUNTER — Telehealth: Payer: Self-pay | Admitting: Family Medicine

## 2022-11-09 NOTE — Telephone Encounter (Signed)
Pt's daughter stated her bp was elevated (166/93) and she was feeling very faint and weak. Transferred to triage.

## 2022-11-10 ENCOUNTER — Encounter: Payer: Self-pay | Admitting: Internal Medicine

## 2022-11-10 ENCOUNTER — Ambulatory Visit (INDEPENDENT_AMBULATORY_CARE_PROVIDER_SITE_OTHER): Payer: Medicare HMO | Admitting: Internal Medicine

## 2022-11-10 VITALS — BP 146/88 | HR 73 | Temp 97.6°F | Resp 18 | Ht <= 58 in | Wt 196.5 lb

## 2022-11-10 DIAGNOSIS — I5032 Chronic diastolic (congestive) heart failure: Secondary | ICD-10-CM | POA: Diagnosis not present

## 2022-11-10 DIAGNOSIS — I10 Essential (primary) hypertension: Secondary | ICD-10-CM | POA: Diagnosis not present

## 2022-11-10 MED ORDER — FUROSEMIDE 20 MG PO TABS: ORAL_TABLET | ORAL | Status: DC

## 2022-11-10 MED ORDER — METOPROLOL TARTRATE 50 MG PO TABS: 75.0000 mg | ORAL_TABLET | Freq: Two times a day (BID) | ORAL | Status: DC

## 2022-11-10 NOTE — Progress Notes (Unsigned)
Subjective:    Patient ID: Loretta Gates, female    DOB: Nov 18, 1935, 87 y.o.   MRN: [id number]  DOS:  11/10/2022 Type of visit - description: Acute, here with her daughter  Admitted to hospital and discharge 10/23/2022. Presented with shortness of breath, increased leg swelling, uncontrolled BP, diagnosed with acute on chronic CHF, she was diuretics, while in-house develop RVR A-fib, treated with diltiazem.   At the end of the hospital stay A-fib was rate controlled and she was euvolemic. She lost 10.2 kg Rx to increase Lasix to 40 mg. Subsequently was seen at the cardiology clinic, they recommend to continue Lasix 20 mg grams twice daily, continue Jardiance.  BP at the time was 128/80.  She is here today with her daughter. Reports that she is trying to be more active and take walks outside the house, at the end of her walk she feels the legs are heavy but denies chest pain, difficulty breathing. States that from time to time she has felt dizzy since they creased the Lasix dose.  She is checking her weight since she left the hospital and is actually stable on her home scales and she thinks she has lost a couple of pounds since she was released from the hospital. Lower extremity edema actually improved since the hospital discharge. Denies nausea vomiting.  No blood in the stools.  No palpitations.  She is also concerned about elevated BPs, she has been checking regularly but yesterday it ranged from 148/86, 177/79.  It is associated with nervous feeling and "not a HA but a compressed  head".  Review of Systems See above   Past Medical History:  Diagnosis Date   Anemia    Atrial fibrillation ([place])    on Coumadin   Breast mass, right    Cancer (HCC)    LEFT BREAST-Mastectomy   GERD (gastroesophageal reflux disease)    Hypertension    OAB (overactive bladder)    PONV (postoperative nausea and vomiting)     Past Surgical History:  Procedure Laterality Date   BREAST LUMPECTOMY  WITH RADIOACTIVE SEED LOCALIZATION Right 10/25/2021   Procedure: RIGHT BREAST LUMPECTOMY WITH RADIOACTIVE SEED LOCALIZATION;  Surgeon: Erroll Luna, MD;  Location: [facility];  Service: General;  Laterality: Right;   CHOLECYSTECTOMY     MASTECTOMY Left 2010   OVARIAN CYST REMOVAL     PARATHYROIDECTOMY      Current Outpatient Medications  Medication Instructions   cholestyramine (QUESTRAN) 4 g, Oral, See admin instructions, Mix 1 teaspoon in water and take at 6pm daily with warfarin.   denosumab (PROLIA) 60 mg, Subcutaneous, Every 6 months   Eliquis 5 mg, Oral, 2 times daily   ergocalciferol (VITAMIN D2) 50,000 Units, Oral, Every 14 days, Every other saturday   furosemide (LASIX) 20 mg, Oral, Daily   irbesartan (AVAPRO) 300 mg, Oral, Daily   Jardiance 10 mg, Oral, Daily   magnesium oxide (MAG-OX) 400 mg, Oral, Daily, Taking 500 mg daily   metoprolol tartrate (LOPRESSOR) 50 mg, Oral, 2 times daily   omeprazole (PRILOSEC) 40 mg, Oral, 2 times daily before meals   oxybutynin (DITROPAN XL) 15 mg, Oral, Daily at bedtime       Objective:   Physical Exam BP (!) 146/90   Pulse 73   Temp 97.6 F (36.4 C) (Oral)   Resp 18   Ht '4\' 10"'$  (1.473 m)   Wt 196 lb 8 oz (89.1 kg)   SpO2 98%   BMI 41.07  kg/m  General:   Well developed, NAD, BMI noted. HEENT:  Normocephalic . Face symmetric, atraumatic Neck slightly increased JVD? Lungs:  CTA B Normal respiratory effort, no intercostal retractions, no accessory muscle use. Heart: Seems regular today. Lower extremities: Trace to +/+++ pretibial edema bilaterally.  Pedal pulses present bilaterally, toes well-perfused.   Skin: Not pale. Not jaundice Neurologic:  alert & oriented X3.  Speech normal, gait appropriate for age and unassisted Psych--  Cognition and judgment appear intact.  Cooperative with normal attention span and concentration.  Behavior appropriate. No anxious or depressed appearing.      Assessment      87 year old female, PMH includes HTN, osteoporosis, h/o IDA, obesity, atrial fibrillation previously on Coumadin, HFpEF, deconditioning, chronic venous insufficiency, - presents with  HTN: Reportedly has been slightly high for several day, yesterday it was checked several times and ranged from 148/86 to 177/79. Current meds: Lasix 20 mg twice daily (recently increased) Avapro 300 mg daily Metoprolol 50 mg B.I.D. Not on amlodipine, likely because edema and CHF.   Plan: Change Lasix to 20 mg twice daily every other day once daily every other day.  Monitor weight consistently, see if that improves the dizziness. Okay to increase metoprolol to 50 mg 1.5 tablets twice daily. BMP CHF: Seems stable Leg heaviness: As described above, has good pedal pulses, suspect related to venous insufficiency.  Time spent 32 minutes, extensive chart review, new patient to me.

## 2022-11-10 NOTE — Telephone Encounter (Signed)
Appt w/ Dr. Larose Kells

## 2022-11-10 NOTE — Progress Notes (Unsigned)
Loretta Gates at [facility] [address] [place], [place] [address] [phone]  Date:  11/15/2022   Name:  Loretta Gates   DOB:  11-Aug-1936   MRN:  [id number]  PCP:  Darreld Mclean, MD    Chief Complaint: No chief complaint on file.   History of Present Illness:  Loretta Gates is a 87 y.o. very pleasant female patient who presents with the following:  Patient seen today for short-term follow-up.  I saw her most recently January 17 at which time she had recently been admitted with CHF exacerbation, atrial for with RVR She is anticoagulated with Eliquis, rate controlled with metoprolol  She was seen by heart care on 1/24 for heart failure consultation:      HEART & VASCULAR TRANSITION OF CARE CONSULT NOTE        Referring Physician: Dr. Cathlean Sauer  Primary Care: Jori Frerichs, Gay Filler, MD Primary Cardiologist: [facility]      HPI: Referred to clinic by Dr. Cathlean Sauer for heart failure consultation.    87 y/o female w/ h/o PAF, previously on Warfarin, HFpEF, HTN, obesity and hyperparathyroidism s/p parathyroidectomy. Has received previous cardiac care at [facility].    Recent admit to [facility] 1/24 for acute on chronic diastolic heart failure. Was in sinus brady at time of admit but went in to AFib w/ RVR during stay. Echo showed normal LVEF 55-60%, RV mildly enlarged w/ moderately elevated pulmonary artery systolic pressure, 78.9 mmHg, but normal RV systolic fx. + mild RAE and mod LAE, only mild MR.      She Diuresed w/ IV Lasix and placed on Dilt gtt for rate controlled. Diuresed well. Remained in afib w/ improved rate control. Transitioned off dilt gtt and home metoprolol increased. Transitioned from warfarin to Eliquis per pt request. Transitioned to PO Lasix. Discharged home on 1/15. D/c wt 192 lb. Referred to [facility] clinic.    She presents today for f/u. Here w/ daughter. Reports doing well since d/c. Checking wt at home daily, wt stable since d/c.  Compliant w/ meds. Tolerating well w/o side effects. Denies abnormal bleeding w/ Eliquis. No resting dyspnea. + chronic LEE that she has had for years since her 89s (suspect venous insuffiencey given presence of varicose veins).   EKG shows persistent atrial fibrillation, rate controlled 80 bpm. She is asymptomatic. BP well controlled.       Cardiac Testing    2D Echo 10/21/22 1. Left ventricular ejection fraction, by estimation, is 55 to 60%. The  left ventricle has normal function. The left ventricle has no regional  wall motion abnormalities. Left ventricular diastolic function could not  be evaluated.   2. Right ventricular systolic function is normal. The right ventricular  size is mildly enlarged. There is moderately elevated pulmonary artery  systolic pressure.   3. Left atrial size was moderately dilated.   4. Right atrial size was mildly dilated.   5. The mitral valve is normal in structure. Mild mitral valve  regurgitation. No evidence of mitral stenosis.   6. Tricuspid valve regurgitation is mild to moderate.   7. The aortic valve is normal in structure. Aortic valve regurgitation is  not visualized. No aortic stenosis is present.   8. The inferior vena cava is dilated in size with <50% respiratory  variability, suggesting right atrial pressure of 15 mmHg.        Review of Systems: [y] = yes, '[ ]'$  = no  General: Weight gain '[ ]'$ ; Weight loss '[ ]'$ ; Anorexia '[ ]'$ ; Fatigue '[ ]'$ ; Fever '[ ]'$ ; Chills '[ ]'$ ; Weakness '[ ]'$   Cardiac: Chest pain/pressure '[ ]'$ ; Resting SOB '[ ]'$ ; Exertional SOB '[ ]'$ ; Orthopnea '[ ]'$ ; Pedal Edema [ Y]; Palpitations '[ ]'$ ; Syncope '[ ]'$ ; Presyncope '[ ]'$ ; Paroxysmal nocturnal dyspnea'[ ]'$   Pulmonary: Cough '[ ]'$ ; Wheezing'[ ]'$ ; Hemoptysis'[ ]'$ ; Sputum '[ ]'$ ; Snoring '[ ]'$   GI: Vomiting'[ ]'$ ; Dysphagia'[ ]'$ ; Melena'[ ]'$ ; Hematochezia '[ ]'$ ; Heartburn'[ ]'$ ; Abdominal pain '[ ]'$ ; Constipation '[ ]'$ ; Diarrhea '[ ]'$ ; BRBPR '[ ]'$   GU: Hematuria'[ ]'$ ; Dysuria '[ ]'$ ; Nocturia'[ ]'$   Vascular: Pain in legs with  walking '[ ]'$ ; Pain in feet with lying flat '[ ]'$ ; Non-healing sores '[ ]'$ ; Stroke '[ ]'$ ; TIA '[ ]'$ ; Slurred speech '[ ]'$ ;  Neuro: Headaches'[ ]'$ ; Vertigo'[ ]'$ ; Seizures'[ ]'$ ; Paresthesias'[ ]'$ ;Blurred vision '[ ]'$ ; Diplopia '[ ]'$ ; Vision changes '[ ]'$   Ortho/Skin: Arthritis '[ ]'$ ; Joint pain '[ ]'$ ; Muscle pain '[ ]'$ ; Joint swelling '[ ]'$ ; Back Pain '[ ]'$ ; Rash '[ ]'$   Psych: Depression'[ ]'$ ; Anxiety'[ ]'$   Heme: Bleeding problems '[ ]'$ ; Clotting disorders '[ ]'$ ; Anemia '[ ]'$   Endocrine: Diabetes '[ ]'$ ; Thyroid dysfunction'[ ]'$          Past Medical History:  Diagnosis Date   Anemia     Atrial fibrillation (HCC)      on Coumadin   Breast mass, right     Cancer (HCC)      LEFT BREAST-Mastectomy   GERD (gastroesophageal reflux disease)     Hypertension     OAB (overactive bladder)     PONV (postoperative nausea and vomiting)              Current Outpatient Medications  Medication Sig Dispense Refill   apixaban (ELIQUIS) 5 MG TABS tablet Take 1 tablet (5 mg total) by mouth 2 (two) times daily. 60 tablet 0   cholestyramine (QUESTRAN) 4 GM/DOSE powder Take 4 g by mouth See admin instructions. Mix 1 teaspoon in water and take at 6pm daily with warfarin.       denosumab (PROLIA) 60 MG/ML SOSY injection Inject 60 mg into the skin every 6 (six) months.       empagliflozin (JARDIANCE) 10 MG TABS tablet Take 1 tablet (10 mg total) by mouth daily. 30 tablet 0   ergocalciferol (VITAMIN D2) 1.25 MG (50000 UT) capsule Take 50,000 Units by mouth every 14 (fourteen) days. Every other saturday       furosemide (LASIX) 20 MG tablet Take 1 tablet (20 mg total) by mouth daily. (Patient taking differently: Take 20 mg by mouth daily. Patient taking 20 mg morning and evening) 30 tablet 0   irbesartan (AVAPRO) 300 MG tablet Take 1 tablet (300 mg total) by mouth daily. 90 tablet 1   magnesium oxide (MAG-OX) 400 (240 Mg) MG tablet Take 400 mg by mouth daily. Taking 500 mg daily       metoprolol tartrate (LOPRESSOR) 50 MG tablet Take 1 tablet (50 mg total) by  mouth 2 (two) times daily. 60 tablet 0   omeprazole (PRILOSEC) 40 MG capsule Take 1 capsule (40 mg total) by mouth 2 (two) times daily before a meal. 180 capsule 1   oxybutynin (DITROPAN XL) 15 MG 24 hr tablet Take 1 tablet (15 mg total) by mouth at bedtime. 90 tablet 3    No current facility-administered medications for this encounter.  Allergies  Allergen Reactions   Bee Venom Swelling      Bee stung her on the inside of her gum. Lips and gums started swelling. No SOB.   Levaquin [Levofloxacin] Hives and Itching   Peanut-Containing Drug Products Other (See Comments)      Stomach pain and diarrhea         Social History         Socioeconomic History   Marital status: Married      Spouse name: Not on file   Number of children: Not on file   Years of education: Not on file   Highest education level: Not on file  Occupational History   Not on file  Tobacco Use   Smoking status: Never   Smokeless tobacco: Never  Vaping Use   Vaping Use: Never used  Substance and Sexual Activity   Alcohol use: Never   Drug use: Never   Sexual activity: Not Currently      Birth control/protection: Post-menopausal  Other Topics Concern   Not on file  Social History Narrative   Not on file    Social Determinants of Health        Financial Resource Strain: Low Risk  (01/27/2022)    Overall Financial Resource Strain (CARDIA)     Difficulty of Paying Living Expenses: Not hard at all  Food Insecurity: No Food Insecurity (10/21/2022)    Hunger Vital Sign     Worried About Running Out of Food in the Last Year: Never true     [place] in the Last Year: Never true  Transportation Needs: No Transportation Needs (10/21/2022)    PRAPARE - Armed forces logistics/support/administrative officer (Medical): No     Lack of Transportation (Non-Medical): No  Physical Activity: Insufficiently Active (01/27/2022)    Exercise Vital Sign     Days of Exercise per Week: 3 days     Minutes of Exercise per  Session: 30 min  Stress: No Stress Concern Present (01/27/2022)    [place]     Feeling of Stress : Not at all  Social Connections: [place] (01/27/2022)    Social Connection and Isolation Panel [NHANES]     Frequency of Communication with Friends and Family: More than three times a week     Frequency of Social Gatherings with Friends and Family: More than three times a week     Attends Religious Services: More than 4 times per year     Active Member of [employer] or Organizations: Yes     Attends Archivist Meetings: More than 4 times per year     Marital Status: Married  Human resources officer Violence: Not At Risk (10/21/2022)    Humiliation, Afraid, Rape, and Kick questionnaire     Fear of Current or Ex-Partner: No     Emotionally Abused: No     Physically Abused: No     Sexually Abused: No             Family History  Problem Relation Age of Onset   Sudden death Neg Hx            Vitals:    11/01/22 1020 11/01/22 1030  BP: 128/80 128/80  Pulse: 82    SpO2: 98%    Weight: 89.6 kg (197 lb 9.6 oz)    Height: '4\' 10"'$  (1.473 m)        PHYSICAL EXAM: General:  Well appearing. No respiratory difficulty HEENT: normal Neck: supple. no JVD. Carotids 2+ bilat; no bruits. No lymphadenopathy or thryomegaly appreciated. Cor: PMI nondisplaced. Irregularly irregular rhythm and rate. No rubs, gallops or murmurs. Lungs: clear Abdomen: soft, nontender, nondistended. No hepatosplenomegaly. No bruits or masses. Good bowel sounds. Extremities: no cyanosis, clubbing, rash, trace b/l LE edema Neuro: alert & oriented x 3, cranial nerves grossly intact. moves all 4 extremities w/o difficulty. Affect pleasant.   ECG: Atrial fibrillation 80 bpm      ASSESSMENT & PLAN:   1. HFpEF  - Echo 1/24 55-60%, RV mildly enlarged w/ moderately elevated pulmonary artery systolic pressure, 19.4 mmHg, but normal RV systolic fx.   - NYHA II, confounded by obesity and deconditioning. Wt stable since discharge, suspect LEE 2/2 chronic venous insuffiencey  - Continue Lasix 20 mg bid - Continue Jardiance 10 mg daily  - PCP checked labs post hospital. I have personally reviewed results. SCr and K stable/WNL  - discussed daily wts. Advised to take extra lasix tablet PRN based on > 3 lb wt gain in 24 hr or > 5 lb in 1 wk  2. PAF - EKG today shows persistent atrial fibrillation, 80 bpm. Asymptomatic   - Echo EF 55-60%, mild RAE and mod LAE, only mild MR - Continue Metoprolol for rate control  - on Eliquis 5 mg bid. Denies abnormal bleeding. Labs followed by [facility] (gets periodic iron infusions for IDA, last Hgb 11.6)  - Continue rate control strategy for now. Refer to cardiology for further management. May consider DCCV after 3 uninterrupted wks of Eliquis if Afib persists  3. Hypertension  - controlled on current regimen - no change   4. Hyperparathyroidism  - s/p parathyroidectomy 5. Chronic Venous Insuffiencey  - Rx given for TED hoses    She was then seen by my partner, Dr. Larose Kells last week on 2/2 with concern of some dizziness, feeling fatigued with exercise and elevated blood pressure He had her alternate her Lasix dosage, 40 mg 1 day and 20 mg the next, increase metoprolol to 75 twice daily, continue daily weight checks  Most recent blood work 1/17 showed normal electrolytes, mild hypercalcemia in keeping with known hyperparathyroidism Patient Active Problem List   Diagnosis Date Noted   Class 3 obesity ([place]) 10/21/2022   A-fib ([place]) 10/20/2022   Acute on chronic diastolic CHF (congestive heart failure) ([place]) 10/20/2022   IDA (iron deficiency anemia) 01/13/2022   Atrial fibrillation ([place]) 01/05/2022   Breast cancer ([place]) 01/05/2022   Primary hyperparathyroidism ([place]) 01/05/2022   Essential hypertension 01/05/2022   Age-related nuclear cataract of both eyes 12/11/2018   Abnormal INR 01/29/2018   Long  term (current) use of anticoagulants 01/29/2018   Encounter for therapeutic drug monitoring 01/29/2018   Vitamin D deficiency 05/03/2017   S/P laparoscopic cholecystectomy 02/26/2017   Calculus of gallbladder without cholecystitis without obstruction 02/23/2017   Diarrhea of presumed infectious origin 02/23/2017   Hyperglycemia 02/23/2017   Right upper quadrant abdominal pain 02/23/2017   Localized edema 01/18/2016   Age-related osteoporosis without current pathological fracture 01/17/2016   Diverticulosis of large intestine without hemorrhage 01/17/2016   GERD (gastroesophageal reflux disease) 01/17/2016   History of left breast cancer 01/17/2016   Multinodular goiter 01/17/2016   OAB (overactive bladder) 01/17/2016   History of cancer chemotherapy 03/06/2015   History of left mastectomy 03/06/2015   Malignant neoplasm of overlapping sites of left female breast ([place]) 03/06/2015    Past Medical History:  Diagnosis Date   Anemia    Atrial fibrillation (HCC)    on Coumadin   Breast mass, right    Cancer (HCC)    LEFT BREAST-Mastectomy   GERD (gastroesophageal reflux disease)    Hypertension    OAB (overactive bladder)    PONV (postoperative nausea and vomiting)     Past Surgical History:  Procedure Laterality Date   BREAST LUMPECTOMY WITH RADIOACTIVE SEED LOCALIZATION Right 10/25/2021   Procedure: RIGHT BREAST LUMPECTOMY WITH RADIOACTIVE SEED LOCALIZATION;  Surgeon: Erroll Luna, MD;  Location: [place];  Service: General;  Laterality: Right;   CHOLECYSTECTOMY     MASTECTOMY Left 2010   OVARIAN CYST REMOVAL     PARATHYROIDECTOMY      Social History   Tobacco Use   Smoking status: Never   Smokeless tobacco: Never  Vaping Use   Vaping Use: Never used  Substance Use Topics   Alcohol use: Never   Drug use: Never    Family History  Problem Relation Age of Onset   Sudden death Neg Hx     Allergies  Allergen Reactions   Bee Venom Swelling     Bee stung her on the inside of her gum. Lips and gums started swelling. No SOB.   Levaquin [Levofloxacin] Hives and Itching   Peanut-Containing Drug Products Other (See Comments)    Stomach pain and diarrhea     Medication list has been reviewed and updated.  Current Outpatient Medications on File Prior to Visit  Medication Sig Dispense Refill   apixaban (ELIQUIS) 5 MG TABS tablet Take 1 tablet (5 mg total) by mouth 2 (two) times daily. 60 tablet 0   cholestyramine (QUESTRAN) 4 GM/DOSE powder Take 1 packet (4 g total) by mouth See admin instructions. Mix 1 teaspoon in water and take at 6pm daily with warfarin. 378 g 3   denosumab (PROLIA) 60 MG/ML SOSY injection Inject 60 mg into the skin every 6 (six) months.     empagliflozin (JARDIANCE) 10 MG TABS tablet Take 1 tablet (10 mg total) by mouth daily. 30 tablet 0   ergocalciferol (VITAMIN D2) 1.25 MG (50000 UT) capsule Take 50,000 Units by mouth every 14 (fourteen) days. Every other saturday     furosemide (LASIX) 20 MG tablet 1 tablet twice a day, 1 tablet once a day, alternate     irbesartan (AVAPRO) 300 MG tablet Take 1 tablet (300 mg total) by mouth daily. 90 tablet 1   magnesium oxide (MAG-OX) 400 (240 Mg) MG tablet Take 400 mg by mouth daily. Taking 500 mg daily     metoprolol tartrate (LOPRESSOR) 50 MG tablet Take 1.5 tablets (75 mg total) by mouth 2 (two) times daily.     omeprazole (PRILOSEC) 40 MG capsule Take 1 capsule (40 mg total) by mouth 2 (two) times daily before a meal. 180 capsule 1   oxybutynin (DITROPAN XL) 15 MG 24 hr tablet Take 1 tablet (15 mg total) by mouth at bedtime. 90 tablet 3   No current facility-administered medications on file prior to visit.    Review of Systems:  As per HPI- otherwise negative.   Physical Examination: There were no vitals filed for this visit. There were no vitals filed for this visit. There is no height or weight on file to calculate BMI. Ideal Body Weight:    GEN: no acute  distress. HEENT: Atraumatic, Normocephalic.  Ears and Nose: No external deformity. CV: RRR, No M/G/R. No JVD. No thrill. No extra  heart sounds. PULM: CTA B, no wheezes, crackles, rhonchi. No retractions. No resp. distress. No accessory muscle use. ABD: S, NT, ND, +BS. No rebound. No HSM. EXTR: No c/c/e PSYCH: Normally interactive. Conversant.    Assessment and Plan: ***  Signed Lamar Blinks, MD

## 2022-11-10 NOTE — Telephone Encounter (Signed)
Nurse Assessment Nurse: [name], RN, [name] Date/Time (Eastern Time): 11/09/2022 3:59:51 PM Confirm and document reason for call. If symptomatic, describe symptoms. ---Caller states patient BP is 150/85 states she is feeling weak and tired. Paitent did take bp medication today. Does the patient have any new or worsening symptoms? ---Yes Will a triage be completed? ---Yes Related visit to physician within the last 2 weeks? ---No Does the PT have any chronic conditions? (i.e. diabetes, asthma, this includes High risk factors for pregnancy, etc.) ---Yes List chronic conditions. ---HTN, cardiac issues Is this a behavioral health or substance abuse call? ---No Guidelines Guideline Title Affirmed Question Affirmed Notes Nurse Date/Time (Eastern Time) Blood Pressure - High Systolic BP >= 161 OR Diastolic >= 096 [name], RN, [facility] 11/09/2022 4:01:25 PM Disp. Time Eilene Ghazi Time) Disposition Final User 11/09/2022 4:11:12 PM SEE PCP WITHIN 3 DAYS Yes [name], RN, [name] PLEASE NOTE: All timestamps contained within this report are represented as [place] Standard Time. CONFIDENTIALTY NOTICE: This fax transmission is intended only for the addressee. It contains information that is legally privileged, confidential or otherwise protected from use or disclosure. If you are not the intended recipient, you are strictly prohibited from reviewing, disclosing, copying using or disseminating any of this information or taking any action in reliance on or regarding this information. If you have received this fax in error, please notify us immediately by telephone so that we can arrange for its return to [place]. Phone: [phone], Toll-Free: [phone], Fax: [phone] Page: 2 of 2 Call Id: [id number] Final Disposition 11/09/2022 4:11:12 PM SEE PCP WITHIN 3 DAYS Yes [name], RN, [place] Disagree/Comply Comply Caller Understands Yes PreDisposition Call Doctor Care Advice Given Per Guideline SEE PCP WITHIN 3 DAYS:  CALL BACK IF: * Weakness or numbness of the face, arm or leg on one side of the body occurs * Difficulty walking, difficulty talking, or severe headache occurs * Chest pain or difficulty breathing occurs * Your blood pressure is over 180/110 * You become worse CARE ADVICE given per High Blood Pressure (Adult) guideline. Comments User: Cristi Loron, RN Date/Time Eilene Ghazi Time): 11/09/2022 4:03:15 PM states he bp orgionally was 166/93 Referrals REFERRED TO PCP OFFICE

## 2022-11-10 NOTE — Patient Instructions (Incomplete)
It was good to see you again today, consider shingles vaccine series and COVID booster if not up-to-date- once you are well  You have the flu- treat with tamiflu and prednisone for your wheezing If you are not doing ok- or getting worse- go to the ER I would also suggest getting a nebulizer machine at your drugstore or [facility] so you can do nebulizer treatments at home   Please let me know if you need anything

## 2022-11-10 NOTE — Patient Instructions (Addendum)
Change her Lasix: 1 tablet twice daily every other day 1 tablet once daily every other day (Alternate days).  Increase metoprolol 50 mg to 1.5 tablet twice daily  Continue checking your weight, be sure is not going up.  Check the  blood pressure regularly BP GOAL is between 110/65 and 140/90 If it is consistently higher or lower, let me know     GO TO THE FRONT DESK, PLEASE SCHEDULE YOUR APPOINTMENTS Come back for a checkup with Dr. Lorelei Pont next week

## 2022-11-13 ENCOUNTER — Other Ambulatory Visit (HOSPITAL_BASED_OUTPATIENT_CLINIC_OR_DEPARTMENT_OTHER): Payer: Self-pay | Admitting: Family Medicine

## 2022-11-13 DIAGNOSIS — Z1231 Encounter for screening mammogram for malignant neoplasm of breast: Secondary | ICD-10-CM

## 2022-11-14 ENCOUNTER — Telehealth: Payer: Self-pay | Admitting: Family Medicine

## 2022-11-14 NOTE — Telephone Encounter (Signed)
Appt tomorrow w/ PCP.

## 2022-11-14 NOTE — Telephone Encounter (Signed)
Initial Comment Caller says that she has had a terrible headache since yesterday. She was in the hospital on Friday and yesterday for A-Fib. Translation No Nurse Assessment Nurse: D'Heur Lucia Gaskins, RN, [place]rien[place] Date/Time (Eastern Time): 11/14/2022 8:44:08 AM Confirm and document reason for call. If symptomatic, describe symptoms. ---Caller says that she has had a terrible headache since yesterday. She was in the hospital on Friday and yesterday for A-Fib. Headache goes away when she lies down. Blood pressure is 130/87, pulse is 111. Does the patient have any new or worsening symptoms? ---Yes Will a triage be completed? ---Yes Related visit to physician within the last 2 weeks? ---Yes Does the PT have any chronic conditions? (i.e. diabetes, asthma, this includes High risk factors for pregnancy, etc.) ---Yes List chronic conditions. ---A-fib, HTN Is this a behavioral health or substance abuse call? ---No Guidelines Guideline Title Affirmed Question Affirmed Notes Nurse Date/Time (Eastern Time) Headache [1] New headache AND [2] age > 59 Harney, RN, Vincente Liberty 11/14/2022 8:49:06 AM Disp. Time Eilene Ghazi Time) Disposition Final User 11/14/2022 8:54:38 AM See PCP within 24 Hours Yes D'Heur Lucia Gaskins, RN, Adrienne Final Disposition 11/14/2022 8:54:38 AM See PCP within 24 Hours Yes D'Heur Lucia Gaskins, RN, Vincente [place] PLEASE NOTE: All timestamps contained within this report are represented as [place] Standard Time. CONFIDENTIALTY NOTICE: This fax transmission is intended only for the addressee. It contains information that is legally privileged, confidential or otherwise protected from use or disclosure. If you are not the intended recipient, you are strictly prohibited from reviewing, disclosing, copying using or disseminating any of this information or taking any action in reliance on or regarding this information. If you have received this fax in error, please notify us immediately by telephone so that  we can arrange for its return to [place]. Phone: [phone], Toll-Free: [phone], Fax: [phone] Page: 2 of 2 Call Id: [id number] [place] Disagree/Comply Comply Caller Understands Yes PreDisposition Call Doctor Care Advice Given Per Guideline SEE PCP WITHIN 24 HOURS: * IF OFFICE WILL BE OPEN: You need to be examined within the next 24 hours. Call your doctor (or NP/PA) when the office opens and make an appointment. PAIN MEDICINES: * For pain relief, you can take either acetaminophen, ibuprofen, or naproxen. CALL BACK IF: * Blurred vision or double vision occurs * Numbness or weakness of the face, arm or leg * Difficulty with speaking * You become worse CARE ADVICE given per Headache (Adult) guideline. Comments User: Vincente Liberty, D'Heur Lucia Gaskins, RN Date/Time Eilene Ghazi Time): 11/14/2022 8:52:00 AM Caller states her headache feels "strange", like it's strong when she gets up. Referrals REFERRED TO PCP OFFICE

## 2022-11-14 NOTE — Telephone Encounter (Signed)
Patient called to cancel her appt for tomorrow because she is not feeling well. Patient reported her BP is 130/87 and heart rate 111. Patient said she has had the worst headache for last 24 hours. Advised patient I would keep appt with [facility] in case Triage nurse recommends her coming in still but that we can cancel if she needs to be seen earlier or at the ED. Patient understands.

## 2022-11-15 ENCOUNTER — Encounter: Payer: Self-pay | Admitting: Family Medicine

## 2022-11-15 ENCOUNTER — Ambulatory Visit (HOSPITAL_BASED_OUTPATIENT_CLINIC_OR_DEPARTMENT_OTHER)
Admission: RE | Admit: 2022-11-15 | Discharge: 2022-11-15 | Disposition: A | Discharge status: Home / Self Care | Payer: Medicare HMO | Source: Ambulatory Visit | Attending: Family Medicine | Admitting: Family Medicine

## 2022-11-15 ENCOUNTER — Ambulatory Visit (INDEPENDENT_AMBULATORY_CARE_PROVIDER_SITE_OTHER): Payer: Medicare HMO | Admitting: Family Medicine

## 2022-11-15 ENCOUNTER — Other Ambulatory Visit: Payer: Self-pay | Admitting: Family Medicine

## 2022-11-15 VITALS — BP 144/86 | HR 91 | Resp 21 | Ht <= 58 in | Wt 190.0 lb

## 2022-11-15 DIAGNOSIS — R059 Cough, unspecified: Secondary | ICD-10-CM | POA: Diagnosis not present

## 2022-11-15 DIAGNOSIS — I4891 Unspecified atrial fibrillation: Secondary | ICD-10-CM

## 2022-11-15 DIAGNOSIS — E210 Primary hyperparathyroidism: Secondary | ICD-10-CM

## 2022-11-15 DIAGNOSIS — R062 Wheezing: Secondary | ICD-10-CM | POA: Diagnosis not present

## 2022-11-15 DIAGNOSIS — I10 Essential (primary) hypertension: Secondary | ICD-10-CM

## 2022-11-15 DIAGNOSIS — J101 Influenza due to other identified influenza virus with other respiratory manifestations: Secondary | ICD-10-CM | POA: Diagnosis not present

## 2022-11-15 LAB — POCT INFLUENZA A/B
Influenza A, POC: POSITIVE — AB
Influenza B, POC: NEGATIVE

## 2022-11-15 LAB — POC COVID19 BINAXNOW: SARS Coronavirus 2 Ag: NEGATIVE

## 2022-11-15 MED ORDER — IPRATROPIUM-ALBUTEROL 0.5-2.5 (3) MG/3ML IN SOLN: 3.0000 mL | Freq: Four times a day (QID) | RESPIRATORY_TRACT | 1 refills | Status: DC | PRN

## 2022-11-15 MED ORDER — OSELTAMIVIR PHOSPHATE 30 MG PO CAPS: 30.0000 mg | ORAL_CAPSULE | Freq: Two times a day (BID) | ORAL | 0 refills | Status: DC

## 2022-11-15 MED ORDER — IPRATROPIUM-ALBUTEROL 0.5-2.5 (3) MG/3ML IN SOLN: 3.0000 mL | Freq: Once | RESPIRATORY_TRACT | Status: DC

## 2022-11-15 MED ORDER — PREDNISONE 20 MG PO TABS: ORAL_TABLET | ORAL | 0 refills | Status: DC

## 2022-11-15 MED ORDER — IPRATROPIUM-ALBUTEROL 0.5-2.5 (3) MG/3ML IN SOLN
3.0000 mL | Freq: Once | RESPIRATORY_TRACT | Status: AC
  Administered 2022-11-15: 3 mL via RESPIRATORY_TRACT

## 2022-11-16 ENCOUNTER — Inpatient Hospital Stay (HOSPITAL_BASED_OUTPATIENT_CLINIC_OR_DEPARTMENT_OTHER): Admission: RE | Admit: 2022-11-16 | Payer: Medicare HMO | Source: Ambulatory Visit

## 2022-11-19 ENCOUNTER — Encounter: Payer: Self-pay | Admitting: Family Medicine

## 2022-11-20 ENCOUNTER — Encounter: Payer: Self-pay | Admitting: Family Medicine

## 2022-11-20 ENCOUNTER — Ambulatory Visit (INDEPENDENT_AMBULATORY_CARE_PROVIDER_SITE_OTHER): Payer: Medicare HMO | Admitting: Family Medicine

## 2022-11-20 ENCOUNTER — Other Ambulatory Visit: Payer: Self-pay

## 2022-11-20 ENCOUNTER — Ambulatory Visit (HOSPITAL_BASED_OUTPATIENT_CLINIC_OR_DEPARTMENT_OTHER)
Admission: RE | Admit: 2022-11-20 | Discharge: 2022-11-20 | Disposition: A | Discharge status: Home / Self Care | Payer: Medicare HMO | Source: Ambulatory Visit | Attending: Family Medicine | Admitting: Family Medicine

## 2022-11-20 VITALS — BP 142/84 | HR 83 | Temp 97.5°F | Resp 15 | Ht <= 58 in | Wt 195.0 lb

## 2022-11-20 DIAGNOSIS — R918 Other nonspecific abnormal finding of lung field: Secondary | ICD-10-CM | POA: Diagnosis not present

## 2022-11-20 DIAGNOSIS — J101 Influenza due to other identified influenza virus with other respiratory manifestations: Secondary | ICD-10-CM | POA: Insufficient documentation

## 2022-11-20 DIAGNOSIS — R062 Wheezing: Secondary | ICD-10-CM | POA: Diagnosis not present

## 2022-11-20 MED ORDER — PREDNISONE 20 MG PO TABS: ORAL_TABLET | ORAL | 0 refills | Status: DC

## 2022-11-20 MED ORDER — EMPAGLIFLOZIN 10 MG PO TABS: 10.0000 mg | ORAL_TABLET | Freq: Every day | ORAL | 0 refills | Status: DC

## 2022-11-20 MED ORDER — METOPROLOL TARTRATE 50 MG PO TABS: 75.0000 mg | ORAL_TABLET | Freq: Two times a day (BID) | ORAL | 1 refills | Status: DC

## 2022-11-20 MED ORDER — APIXABAN 5 MG PO TABS: 5.0000 mg | ORAL_TABLET | Freq: Two times a day (BID) | ORAL | 0 refills | Status: DC

## 2022-11-20 NOTE — Progress Notes (Signed)
[place] at [facility] [address], [place], [place] [address] [phone]  Date:  11/20/2022   Name:  Loretta Gates   DOB:  01-06-1936   MRN:  [id number]  PCP:  Darreld Mclean, MD    Chief Complaint: Follow-up (Follow up)   History of Present Illness:  Loretta Gates is a 87 y.o. very pleasant female patient who presents with the following:  Seen today for follow-up-seen by myself on 2/7 at which point she had been ill for 2 days, tested positive for flu History of CHF, atrial fibrillation-she uses Eliquis and metoprolol  We started her on Tamiflu, chest x-ray 2/7 consistent with viral infection  [name] does seem to making progress, but she and her daughter want to make sure she is getting better.  They have really been isolating Loretta Gates from the rest of the family to avoid spreading her infection and Loretta Gates admits this has been pretty hard on her I advised him that while we cannot know for [id number] certain, since she is now a week out from getting sick she is less likely to be contagious  Since her last visit pt notes her "head feels tight" sometimes Leaning on something with her head will make it go away She feels wobbly when she stands up She is eating but less than usual No fever noted - 96 this am Her breathing is better However she is still wheezing some  - she took her last dose of pred this am  She is using duoneb but it does not seem to help   Her usual weight is about 195  Wt Readings from Last 3 Encounters:  11/20/22 195 lb (88.5 kg)  11/15/22 190 lb (86.2 kg)  11/10/22 196 lb 8 oz (89.1 kg)      Patient Active Problem List   Diagnosis Date Noted   Class 3 obesity ([place]) 10/21/2022   Acute on chronic diastolic CHF (congestive heart failure) ([place]) 10/20/2022   IDA (iron deficiency anemia) 01/13/2022   Atrial fibrillation ([place]) 01/05/2022   Breast cancer ([place]) 01/05/2022   Primary hyperparathyroidism  ([place]) 01/05/2022   Essential hypertension 01/05/2022   Age-related nuclear cataract of both eyes 12/11/2018   Abnormal INR 01/29/2018   Long term (current) use of anticoagulants 01/29/2018   Encounter for therapeutic drug monitoring 01/29/2018   Vitamin D deficiency 05/03/2017   S/P laparoscopic cholecystectomy 02/26/2017   Calculus of gallbladder without cholecystitis without obstruction 02/23/2017   Diarrhea of presumed infectious origin 02/23/2017   Hyperglycemia 02/23/2017   Right upper quadrant abdominal pain 02/23/2017   Localized edema 01/18/2016   Age-related osteoporosis without current pathological fracture 01/17/2016   Diverticulosis of large intestine without hemorrhage 01/17/2016   GERD (gastroesophageal reflux disease) 01/17/2016   History of left breast cancer 01/17/2016   Multinodular goiter 01/17/2016   OAB (overactive bladder) 01/17/2016   History of cancer chemotherapy 03/06/2015   History of left mastectomy 03/06/2015   Malignant neoplasm of overlapping sites of left female breast ([place]) 03/06/2015    Past Medical History:  Diagnosis Date   Anemia    Atrial fibrillation ([place])    on Coumadin   Breast mass, right    Cancer ([place])    LEFT BREAST-Mastectomy   GERD (gastroesophageal reflux disease)    Hypertension    OAB (overactive bladder)    PONV (postoperative nausea and vomiting)     Past Surgical History:  Procedure Laterality Date  BREAST LUMPECTOMY WITH RADIOACTIVE SEED LOCALIZATION Right 10/25/2021   Procedure: RIGHT BREAST LUMPECTOMY WITH RADIOACTIVE SEED LOCALIZATION;  Surgeon: Erroll Luna, MD;  Location: [place];  Service: General;  Laterality: Right;   CHOLECYSTECTOMY     MASTECTOMY Left 2010   OVARIAN CYST REMOVAL     PARATHYROIDECTOMY      Social History   Tobacco Use   Smoking status: Never   Smokeless tobacco: Never  Vaping Use   Vaping Use: Never used  Substance Use Topics   Alcohol use: Never   Drug use:  Never    Family History  Problem Relation Age of Onset   Sudden death Neg Hx     Allergies  Allergen Reactions   Bee Venom Swelling    Bee stung her on the inside of her gum. Lips and gums started swelling. No SOB.   Levaquin [Levofloxacin] Hives and Itching   Peanut-Containing Drug Products Other (See Comments)    Stomach pain and diarrhea     Medication list has been reviewed and updated.  Current Outpatient Medications on File Prior to Visit  Medication Sig Dispense Refill   cholestyramine (QUESTRAN) 4 GM/DOSE powder Take 1 packet (4 g total) by mouth See admin instructions. Mix 1 teaspoon in water and take at 6pm daily with warfarin. 378 g 3   denosumab (PROLIA) 60 MG/ML SOSY injection Inject 60 mg into the skin every 6 (six) months.     ergocalciferol (VITAMIN D2) 1.25 MG (50000 UT) capsule Take 50,000 Units by mouth every 14 (fourteen) days. Every other saturday     furosemide (LASIX) 20 MG tablet 1 tablet twice a day, 1 tablet once a day, alternate     ipratropium-albuterol (DUONEB) 0.5-2.5 (3) MG/3ML SOLN Take 3 mLs by nebulization every 6 (six) hours as needed. 360 mL 1   irbesartan (AVAPRO) 300 MG tablet Take 1 tablet (300 mg total) by mouth daily. 90 tablet 1   magnesium oxide (MAG-OX) 400 (240 Mg) MG tablet Take 400 mg by mouth daily. Taking 500 mg daily     metoprolol tartrate (LOPRESSOR) 50 MG tablet Take 1.5 tablets (75 mg total) by mouth 2 (two) times daily. 270 tablet 1   omeprazole (PRILOSEC) 40 MG capsule Take 1 capsule (40 mg total) by mouth 2 (two) times daily before a meal. 180 capsule 1   oseltamivir (TAMIFLU) 30 MG capsule Take 1 capsule (30 mg total) by mouth 2 (two) times daily. 10 capsule 0   oxybutynin (DITROPAN XL) 15 MG 24 hr tablet Take 1 tablet (15 mg total) by mouth at bedtime. 90 tablet 3   No current facility-administered medications on file prior to visit.    Review of Systems:  As per HPI- otherwise negative.   Physical  Examination: Vitals:   11/20/22 1428  BP: (!) 142/84  Pulse: 83  Resp: 15  Temp: (!) 97.5 F (36.4 C)  SpO2: 95%   Vitals:   11/20/22 1428  Weight: 195 lb (88.5 kg)  Height: 4' 10"$  (1.473 m)   Body mass index is 40.76 kg/m. Ideal Body Weight: Weight in (lb) to have BMI = 25: 119.4  GEN: no acute distress.  Elderly lady, looks significantly better than at our visit on 2/7.  Tachypnea has resolved, she is breathing much more easily HEENT: Atraumatic, Normocephalic.  Ears and Nose: No external deformity. CV: RRR, No M/G/R. No JVD. No thrill. No extra heart sounds. PULM: Mild bilateral wheezing persists. No retractions. No resp. distress.  No accessory muscle use. ABD: S, NT, ND. No rebound. No HSM. EXTR: No c/c/e PSYCH: Normally interactive. Conversant.    Assessment and Plan: Influenza A - Plan: DG Chest 2 View  Wheezing - Plan: predniSONE (DELTASONE) 20 MG tablet, DG Chest 2 View  Patient seen today after recent visit with influenza.  She does seem to be feeling better, but they want to be sure she is on the right track.  She is bothered by continued wheezing.  Will have her take 20 mg of prednisone for another 3 to 5 days.  Will repeat chest x-ray today Advised they can reasonably loosen up isolation at home which should also help her feel better  Signed Lamar Blinks, MD  Received chest  x-ray as below, message to patient  DG Chest 2 View  Result Date: 11/20/2022 CLINICAL DATA:  Follow-up EXAM: CHEST - 2 VIEW COMPARISON:  Chest x-ray dated November 15, 2022 FINDINGS: Cardiac and mediastinal contours are unchanged. Mild scattered linear opacities of the lower lung are similar to prior exam. No evidence of pleural effusion or pneumothorax. IMPRESSION: Mild scattered linear opacities of the lower lung are similar to prior exam. Recommend follow-up PA and lateral chest x-ray in 6-8 weeks Electronically Signed   By: Yetta Glassman M.D.   On: 11/20/2022 15:24

## 2022-11-20 NOTE — Patient Instructions (Addendum)
It was good to see you today- I will be in touch with your chest x-ray asap Let's have you take 20 mg of prednisone daily for another 3-5 days  I do think you are looking better but please do keep me posted - I hope you continue to improve

## 2022-11-22 ENCOUNTER — Encounter: Payer: Self-pay | Admitting: Family Medicine

## 2022-11-23 MED ORDER — PROMETHAZINE-DM 6.25-15 MG/5ML PO SYRP: 2.5000 mL | ORAL_SOLUTION | Freq: Four times a day (QID) | ORAL | 0 refills | Status: DC | PRN

## 2022-11-27 NOTE — Progress Notes (Signed)
Referring-Jessica Copland, MD Reason for referral-atrial fibrillation and congestive heart failure  HPI: 87 year old female for evaluation of atrial fibrillation and congestive heart failure at request of Lamar Blinks, MD.  Patient previously followed in [facility].  Echocardiogram January 2024 showed normal LV function, mild right ventricular enlargement, moderate pulmonary hypertension, moderate left atrial enlargement, mild right atrial enlargement, mild mitral regurgitation, mild to moderate tricuspid regurgitation.  Patient was admitted January 2024 with CHF.  Patient was in atrial fibrillation at that time and placed on Cardizem and metoprolol increased.  She was diuresed as well.  She was discharged and outpatient follow-up scheduled with me.  Of note she did have an electrocardiogram July 19, 2022 that by report showed sinus rhythm.  Since last seen she notes some dyspnea on exertion but no orthopnea or PND.  She has chronic pedal edema that may be slightly worse.  She has slightly decreased energy.  She has some chest discomfort after eating peanut butter last evening but denies exertional chest pain.  She denies palpitations, syncope, melena or hematochezia.  Current Outpatient Medications  Medication Sig Dispense Refill   apixaban (ELIQUIS) 5 MG TABS tablet Take 1 tablet (5 mg total) by mouth 2 (two) times daily. 60 tablet 0   cholestyramine (QUESTRAN) 4 GM/DOSE powder Take 1 packet (4 g total) by mouth See admin instructions. Mix 1 teaspoon in water and take at 6pm daily with warfarin. 378 g 3   denosumab (PROLIA) 60 MG/ML SOSY injection Inject 60 mg into the skin every 6 (six) months.     empagliflozin (JARDIANCE) 10 MG TABS tablet Take 1 tablet (10 mg total) by mouth daily. 30 tablet 0   ergocalciferol (VITAMIN D2) 1.25 MG (50000 UT) capsule Take 50,000 Units by mouth every 14 (fourteen) days. Every other saturday     furosemide (LASIX) 20 MG tablet 2 po qd 60 tablet 5    irbesartan (AVAPRO) 300 MG tablet Take 1 tablet (300 mg total) by mouth daily. 90 tablet 1   magnesium oxide (MAG-OX) 400 (240 Mg) MG tablet Take 400 mg by mouth daily. Taking 500 mg daily     metoprolol tartrate (LOPRESSOR) 50 MG tablet Take 1.5 tablets (75 mg total) by mouth 2 (two) times daily. 270 tablet 1   NONFORMULARY OR COMPOUNDED ITEM Compression socks  20-30 mmhg  Dx low ext edema 1 each 1   omeprazole (PRILOSEC) 40 MG capsule Take 1 capsule (40 mg total) by mouth 2 (two) times daily before a meal. 180 capsule 1   oxybutynin (DITROPAN XL) 15 MG 24 hr tablet Take 1 tablet (15 mg total) by mouth at bedtime. 90 tablet 3   No current facility-administered medications for this visit.    Allergies  Allergen Reactions   Bee Venom Swelling    Bee stung her on the inside of her gum. Lips and gums started swelling. No SOB.   Levaquin [Levofloxacin] Hives and Itching   Peanut-Containing Drug Products Other (See Comments)    Stomach pain and diarrhea      Past Medical History:  Diagnosis Date   Anemia    Atrial fibrillation ([facility])    on Coumadin   Breast mass, right    Cancer ([facility])    LEFT BREAST-Mastectomy   CHF (congestive heart failure) ([facility])    GERD (gastroesophageal reflux disease)    Hypertension    OAB (overactive bladder)    PONV (postoperative nausea and vomiting)     Past Surgical History:  Procedure Laterality Date   BREAST LUMPECTOMY WITH RADIOACTIVE SEED LOCALIZATION Right 10/25/2021   Procedure: RIGHT BREAST LUMPECTOMY WITH RADIOACTIVE SEED LOCALIZATION;  Surgeon: Erroll Luna, MD;  Location: [facility];  Service: General;  Laterality: Right;   CHOLECYSTECTOMY     MASTECTOMY Left 2010   OVARIAN CYST REMOVAL     PARATHYROIDECTOMY      Social History   Socioeconomic History   Marital status: Married    Spouse name: Not on file   Number of children: Not on file   Years of education: Not on file   Highest education level: Not on file   Occupational History   Not on file  Tobacco Use   Smoking status: Never   Smokeless tobacco: Never  Vaping Use   Vaping Use: Never used  Substance and Sexual Activity   Alcohol use: Never   Drug use: Never   Sexual activity: Not Currently    Birth control/protection: Post-menopausal  Other Topics Concern   Not on file  Social History Narrative   Not on file   Social Determinants of Health   Financial Resource Strain: Low Risk  (01/27/2022)   Overall Financial Resource Strain (CARDIA)    Difficulty of Paying Living Expenses: Not hard at all  Food Insecurity: No Food Insecurity (10/21/2022)   Hunger Vital Sign    Worried About Running Out of Food in the Last Year: Never true    [place] in the Last Year: Never true  Transportation Needs: No Transportation Needs (10/21/2022)   PRAPARE - Hydrologist (Medical): No    Lack of Transportation (Non-Medical): No  Physical Activity: Insufficiently Active (01/27/2022)   Exercise Vital Sign    Days of Exercise per Week: 3 days    Minutes of Exercise per Session: 30 min  Stress: No Stress Concern Present (01/27/2022)   [place]    Feeling of Stress : Not at all  Social Connections: [place] (01/27/2022)   Social Connection and Isolation Panel [NHANES]    Frequency of Communication with Friends and Family: More than three times a week    Frequency of Social Gatherings with Friends and Family: More than three times a week    Attends Religious Services: More than 4 times per year    Active Member of [employer] or Organizations: Yes    Attends Music therapist: More than 4 times per year    Marital Status: Married  Human resources officer Violence: Not At Risk (10/21/2022)   Humiliation, Afraid, Rape, and Kick questionnaire    Fear of Current or Ex-Partner: No    Emotionally Abused: No    Physically Abused: No    Sexually  Abused: No    Family History  Problem Relation Age of Onset   Sudden death Neg Hx     ROS: no fevers or chills, productive cough, hemoptysis, dysphasia, odynophagia, melena, hematochezia, dysuria, hematuria, rash, seizure activity, orthopnea, PND, claudication. Remaining systems are negative.  Physical Exam:   Blood pressure 126/78, pulse 83, height '4\' 11"'$  (1.499 m), weight 193 lb 3.2 oz (87.6 kg), SpO2 96 %.  General:  Well developed/well nourished in NAD Skin warm/dry Patient not depressed No peripheral clubbing Back-normal HEENT-normal/normal eyelids Neck supple/normal carotid upstroke bilaterally; no bruits; no JVD; no thyromegaly chest - CTA/ normal expansion CV -irregular Abdomen -NT/ND, no HSM, no mass, + bowel sounds, no bruit 2+ femoral pulses, no  bruits Ext-trace to 1+ ankle edema, chords, 2+ DP Neuro-grossly nonfocal  ECG -atrial fibrillation at a rate of 71, no ST changes.  Personally reviewed  A/P  1 persistent atrial fibrillation-patient did have an electrocardiogram July 19, 2022 that showed sinus rhythm.  Long discussion today concerning rhythm control versus rate control.  I think she is symptomatic with some increased dyspnea on exertion and decreased energy.  We will continue metoprolol for rate control.  Continue apixaban.  I would like to proceed with cardioversion to see if she will hold sinus rhythm and to see if it would improve her symptoms.  She would like to consider this and then she will contact us if she is agreeable to proceed.  Note her LV function is normal.  2 chronic diastolic congestive heart failure-she is euvolemic on examination.  Will continue diuretic at present dose.  Continue Jardiance.  3 hypertension-blood pressure controlled.  Continue present medications and follow-up.  Kirk Ruths, MD

## 2022-11-29 ENCOUNTER — Ambulatory Visit: Payer: Medicare HMO | Admitting: Cardiology

## 2022-11-30 ENCOUNTER — Encounter: Payer: Self-pay | Admitting: Family Medicine

## 2022-12-01 ENCOUNTER — Ambulatory Visit: Payer: Medicare HMO | Admitting: Family Medicine

## 2022-12-01 ENCOUNTER — Ambulatory Visit (INDEPENDENT_AMBULATORY_CARE_PROVIDER_SITE_OTHER): Payer: Medicare HMO | Admitting: Family Medicine

## 2022-12-01 ENCOUNTER — Encounter: Payer: Self-pay | Admitting: Family Medicine

## 2022-12-01 VITALS — BP 122/82 | HR 82 | Temp 98.5°F | Resp 20 | Ht <= 58 in | Wt 195.8 lb

## 2022-12-01 DIAGNOSIS — R600 Localized edema: Secondary | ICD-10-CM

## 2022-12-01 DIAGNOSIS — L723 Sebaceous cyst: Secondary | ICD-10-CM

## 2022-12-01 MED ORDER — CEPHALEXIN 500 MG PO CAPS: 500.0000 mg | ORAL_CAPSULE | Freq: Two times a day (BID) | ORAL | 0 refills | Status: DC

## 2022-12-01 MED ORDER — FUROSEMIDE 20 MG PO TABS: ORAL_TABLET | ORAL | 5 refills | Status: DC

## 2022-12-01 MED ORDER — NONFORMULARY OR COMPOUNDED ITEM: 1 refills | Status: DC

## 2022-12-01 NOTE — Patient Instructions (Signed)
Edema  Edema is an abnormal buildup of fluids in the body tissues and under the skin. Swelling of the legs, feet, and ankles is a common symptom that becomes more likely as you get older. Swelling is also common in looser tissues, such as around the eyes. Pressing on the area may make a temporary dent in your skin (pitting edema). This fluid may also accumulate in your lungs (pulmonary edema). There are many possible causes of edema. Eating too much salt (sodium) and being on your feet or sitting for a long time can cause edema in your legs, feet, and ankles. Common causes of edema include: Certain medical conditions, such as heart failure, liver or kidney disease, and cancer. Weak leg blood vessels. An injury. Pregnancy. Medicines. Being obese. Low protein levels in the blood. Hot weather may make edema worse. Edema is usually painless. Your skin may look swollen or shiny. Follow these instructions at home: Medicines Take over-the-counter and prescription medicines only as told by your health care provider. Your health care provider may prescribe a medicine to help your body get rid of extra water (diuretic). Take this medicine if you are told to take it. Eating and drinking Eat a low-salt (low-sodium) diet to reduce fluid as told by your health care provider. Sometimes, eating less salt may reduce swelling. Depending on the cause of your swelling, you may need to limit how much fluid you drink (fluid restriction). General instructions Raise (elevate) the injured area above the level of your heart while you are sitting or lying down. Do not sit still or stand for long periods of time. Do not wear tight clothing. Do not wear garters on your upper legs. Exercise your legs to get your circulation going. This helps to move the fluid back into your blood vessels, and it may help the swelling go down. Wear compression stockings as told by your health care provider. These stockings help to prevent  blood clots and reduce swelling in your legs. It is important that these are the correct size. These stockings should be prescribed by your health care provider to prevent possible injuries. If elastic bandages or wraps are recommended, use them as told by your health care provider. Contact a health care provider if: Your edema does not get better with treatment. You have heart, liver, or kidney disease and have symptoms of edema. You have sudden and unexplained weight gain. Get help right away if: You develop shortness of breath or chest pain. You cannot breathe when you lie down. You develop pain, redness, or warmth in the swollen areas. You have heart, liver, or kidney disease and suddenly get edema. You have a fever and your symptoms suddenly get worse. These symptoms may be an emergency. Get help right away. Call 911. Do not wait to see if the symptoms will go away. Do not drive yourself to the hospital. Summary Edema is an abnormal buildup of fluids in the body tissues and under the skin. Eating too much salt (sodium)and being on your feet or sitting for a long time can cause edema in your legs, feet, and ankles. Raise (elevate) the injured area above the level of your heart while you are sitting or lying down. Follow your health care provider's instructions about diet and how much fluid you can drink. This information is not intended to replace advice given to you by your health care provider. Make sure you discuss any questions you have with your health care provider. Document Revised: 05/30/2021 Document   Reviewed: 05/30/2021 Elsevier Patient Education  2023 [employer].  

## 2022-12-01 NOTE — Progress Notes (Signed)
Subjective:   By signing my name below, I, Loretta Gates, attest that this documentation has been prepared under the direction and in the presence of Loretta Held, DO 12/01/22   Patient ID: Loretta Gates, female    DOB: 05/08/1936, 87 y.o.   MRN: [id number]  Chief Complaint  Patient presents with   Leg Swelling    Both legs and feet. Pt states legs and feet got worse over the last severals days and states noticing weeping.     HPI Patient is in today for evaluation of leg swelling. She is accompanied by her daughter, Loretta Gates, today.  Leg swelling: She reports the her BLE swelling extends from her feet to her knees. She noticed some weeping of her legs last night. She is taking Lasix and alternating between '40mg'$  a day and '20mg'$  a day. She weighs herself every day, and her weight has been stable fluctuating between 190-195lbs this month.  She was hospitalized for A-fib with RVR and acute CHF exacerbation on 10/20/22. She is taking Jardiance '10mg'$  daily.  Her heart rates are well controlled for the most part (between 72-85bpm) with Lopressor '75mg'$  BID. She had 1 day earlier this month where her heart rate was between 111-150bpm, and she noted feeling poorly with a headache and upset stomach that day. Her blood pressures have been well controlled between [id number] systolic and [id number] diastolic. Taking MagOx '500mg'$  daily as well. She has not missed any doses of her Eliquis '5mg'$  BID.  She is scheduled to see cardiology Dr. Kirk Ruths on 12/11/22.  Decreased urination: She states that she always wears a pad at night and previously would go through 1-2 pads at night due to nocturia. However, she has not had as much nighttime urination recently. She reports that she only drinks 1 glass of water most days.  Headaches: Patient reports that she has struggled with fatigue and intermittent headache-like pressure since having the flu last month. She states that she does not typically check her  blood pressure when her headaches occur. Her pain is relieved with she lies back on her couch. She denies any vision changes.  Cyst: She states that she has had an intermittent cyst on her right upper back at her bra line. This has been drained multiple times over the last year.   Past Medical History:  Diagnosis Date   Anemia    Atrial fibrillation (HCC)    on Coumadin   Breast mass, right    Cancer (HCC)    LEFT BREAST-Mastectomy   CHF (congestive heart failure) (HCC)    GERD (gastroesophageal reflux disease)    Hypertension    OAB (overactive bladder)    PONV (postoperative nausea and vomiting)     Past Surgical History:  Procedure Laterality Date   BREAST LUMPECTOMY WITH RADIOACTIVE SEED LOCALIZATION Right 10/25/2021   Procedure: RIGHT BREAST LUMPECTOMY WITH RADIOACTIVE SEED LOCALIZATION;  Surgeon: Erroll Luna, MD;  Location: [place];  Service: General;  Laterality: Right;   CHOLECYSTECTOMY     MASTECTOMY Left 2010   OVARIAN CYST REMOVAL     PARATHYROIDECTOMY      Family History  Problem Relation Age of Onset   Sudden death Neg Hx     Social History   Socioeconomic History   Marital status: Married    Spouse name: Not on file   Number of children: Not on file   Years of education: Not on file   Highest education level: Not on  file  Occupational History   Not on file  Tobacco Use   Smoking status: Never   Smokeless tobacco: Never  Vaping Use   Vaping Use: Never used  Substance and Sexual Activity   Alcohol use: Never   Drug use: Never   Sexual activity: Not Currently    Birth control/protection: Post-menopausal  Other Topics Concern   Not on file  Social History Narrative   Not on file   Social Determinants of Health   Financial Resource Strain: Low Risk  (01/27/2022)   Overall Financial Resource Strain (CARDIA)    Difficulty of Paying Living Expenses: Not hard at all  Food Insecurity: No Food Insecurity (10/21/2022)   Hunger  Vital Sign    Worried About Running Out of Food in the Last Year: Never true    Ran Out of Food in the Last Year: Never true  Transportation Needs: No Transportation Needs (10/21/2022)   PRAPARE - Hydrologist (Medical): No    Lack of Transportation (Non-Medical): No  Physical Activity: Insufficiently Active (01/27/2022)   Exercise Vital Sign    Days of Exercise per Week: 3 days    Minutes of Exercise per Session: 30 min  Stress: No Stress Concern Present (01/27/2022)   [place]    Feeling of Stress : Not at all  Social Connections: [place] (01/27/2022)   Social Connection and Isolation Panel [NHANES]    Frequency of Communication with Friends and Family: More than three times a week    Frequency of Social Gatherings with Friends and Family: More than three times a week    Attends Religious Services: More than 4 times per year    Active Member of [employer] or Organizations: Yes    Attends Music therapist: More than 4 times per year    Marital Status: Married  Human resources officer Violence: Not At Risk (10/21/2022)   Humiliation, Afraid, Rape, and Kick questionnaire    Fear of Current or Ex-Partner: No    Emotionally Abused: No    Physically Abused: No    Sexually Abused: No    Outpatient Medications Prior to Visit  Medication Sig Dispense Refill   apixaban (ELIQUIS) 5 MG TABS tablet Take 1 tablet (5 mg total) by mouth 2 (two) times daily. 60 tablet 0   cholestyramine (QUESTRAN) 4 GM/DOSE powder Take 1 packet (4 g total) by mouth See admin instructions. Mix 1 teaspoon in water and take at 6pm daily with warfarin. 378 g 3   denosumab (PROLIA) 60 MG/ML SOSY injection Inject 60 mg into the skin every 6 (six) months.     empagliflozin (JARDIANCE) 10 MG TABS tablet Take 1 tablet (10 mg total) by mouth daily. 30 tablet 0   ergocalciferol (VITAMIN D2) 1.25 MG (50000 UT) capsule  Take 50,000 Units by mouth every 14 (fourteen) days. Every other saturday     ipratropium-albuterol (DUONEB) 0.5-2.5 (3) MG/3ML SOLN Take 3 mLs by nebulization every 6 (six) hours as needed. 360 mL 1   irbesartan (AVAPRO) 300 MG tablet Take 1 tablet (300 mg total) by mouth daily. 90 tablet 1   magnesium oxide (MAG-OX) 400 (240 Mg) MG tablet Take 400 mg by mouth daily. Taking 500 mg daily     metoprolol tartrate (LOPRESSOR) 50 MG tablet Take 1.5 tablets (75 mg total) by mouth 2 (two) times daily. 270 tablet 1   omeprazole (PRILOSEC) 40 MG capsule Take 1 capsule (  40 mg total) by mouth 2 (two) times daily before a meal. 180 capsule 1   oseltamivir (TAMIFLU) 30 MG capsule Take 1 capsule (30 mg total) by mouth 2 (two) times daily. 10 capsule 0   oxybutynin (DITROPAN XL) 15 MG 24 hr tablet Take 1 tablet (15 mg total) by mouth at bedtime. 90 tablet 3   predniSONE (DELTASONE) 20 MG tablet Take 20 mg daily for 3-5 days 5 tablet 0   promethazine-dextromethorphan (PROMETHAZINE-DM) 6.25-15 MG/5ML syrup Take 2.5-5 mLs by mouth 4 (four) times daily as needed for cough. 118 mL 0   furosemide (LASIX) 20 MG tablet 1 tablet twice a day, 1 tablet once a day, alternate     No facility-administered medications prior to visit.    Allergies  Allergen Reactions   Bee Venom Swelling    Bee stung her on the inside of her gum. Lips and gums started swelling. No SOB.   Levaquin [Levofloxacin] Hives and Itching   Peanut-Containing Drug Products Other (See Comments)    Stomach pain and diarrhea     Review of Systems  Constitutional:  Positive for malaise/fatigue. Negative for fever.  HENT:  Negative for congestion, sinus pain and sore throat.   Eyes:  Negative for blurred vision, double vision and photophobia.  Respiratory:  Negative for cough, shortness of breath and wheezing.   Cardiovascular:  Positive for leg swelling. Negative for chest pain and palpitations.  Gastrointestinal:  Negative for abdominal pain,  constipation, diarrhea, nausea and vomiting.  Genitourinary:  Negative for dysuria and hematuria.       + decreased urination  Neurological:  Positive for headaches.       Objective:    Physical Exam Vitals and nursing note reviewed.  Constitutional:      General: She is not in acute distress.    Appearance: Normal appearance. She is not ill-appearing.  HENT:     Head: Normocephalic and atraumatic.     Right Ear: External ear normal.     Left Ear: External ear normal.  Eyes:     Extraocular Movements: Extraocular movements intact.     Pupils: Pupils are equal, round, and reactive to light.  Cardiovascular:     Rate and Rhythm: Normal rate and regular rhythm.     Heart sounds: Normal heart sounds. No murmur heard.    No gallop.     Comments: BLE up to the knees Right leg with some redness, scratches, and increased swelling and oozing of serosanguinous fluid on the posterior aspect No calf pain Pulmonary:     Effort: Pulmonary effort is normal. No respiratory distress.     Breath sounds: Normal breath sounds. No wheezing or rales.  Musculoskeletal:        General: Swelling present.     Right lower leg: 2+ Pitting Edema present.     Left lower leg: 2+ Pitting Edema present.  Skin:    General: Skin is warm and dry.  Neurological:     General: No focal deficit present.     Mental Status: She is alert and oriented to person, place, and time.  Psychiatric:        Judgment: Judgment normal.     BP 122/82 (BP Location: Right Arm, Patient Position: Sitting, Cuff Size: Large)   Pulse 82   Temp 98.5 F (36.9 C) (Oral)   Resp 20   Ht '4\' 10"'$  (1.473 m)   Wt 195 lb 12.8 oz (88.8 kg)   SpO2 94%  BMI 40.92 kg/m  Wt Readings from Last 3 Encounters:  12/01/22 195 lb 12.8 oz (88.8 kg)  11/20/22 195 lb (88.5 kg)  11/15/22 190 lb (86.2 kg)   CBC    Component Value Date/Time   WBC 10.2 10/21/2022 0016   RBC 3.99 10/21/2022 0016   HGB 11.6 (L) 10/21/2022 0016   HGB 13.6  10/10/2022 1342   HCT 36.5 10/21/2022 0016   PLT 247 10/21/2022 0016   PLT 271 10/10/2022 1342   MCV 91.5 10/21/2022 0016   MCH 29.1 10/21/2022 0016   MCHC 31.8 10/21/2022 0016   RDW 14.6 10/21/2022 0016   LYMPHSABS 1.3 10/20/2022 0959   MONOABS 0.7 10/20/2022 0959   EOSABS 0.1 10/20/2022 0959   BASOSABS 0.0 10/20/2022 0959   CMP     Component Value Date/Time   NA 139 10/25/2022 1028   K 4.7 10/25/2022 1028   CL 103 10/25/2022 1028   CO2 28 10/25/2022 1028   GLUCOSE 88 10/25/2022 1028   BUN 23 10/25/2022 1028   CREATININE 0.77 10/25/2022 1028   CREATININE 0.68 03/10/2022 1011   CALCIUM 11.0 (H) 10/25/2022 1028   PROT 6.9 10/20/2022 0959   ALBUMIN 3.8 10/20/2022 0959   AST 29 10/20/2022 0959   AST 20 03/10/2022 1011   ALT 39 10/20/2022 0959   ALT 26 03/10/2022 1011   ALKPHOS 49 10/20/2022 0959   BILITOT 0.9 10/20/2022 0959   BILITOT 0.5 03/10/2022 1011   GFRNONAA 51 (L) 10/23/2022 0018   GFRNONAA >60 03/10/2022 1011       Assessment & Plan:  Lower extremity edema -     Furosemide; 2 po qd  Dispense: 60 tablet; Refill: 5 -     CBC with Differential/Platelet -     Comprehensive metabolic panel -     NONFORMULARY OR COMPOUNDED ITEM; Compression socks  20-30 mmhg  Dx low ext edema  Dispense: 1 each; Refill: 1  Sebaceous cyst -     Cephalexin; Take 1 capsule (500 mg total) by mouth 2 (two) times daily.  Dispense: 20 capsule; Refill: 0     I,Alexis Herring,acting as a scribe for [employer], DO.,have documented all relevant documentation on the behalf of Loretta Held, DO,as directed by  Loretta Held, DO while in the presence of Booker, DO, personally preformed the services described in this documentation.  All medical record entries made by the scribe were at my direction and in my presence.  I have reviewed the chart and discharge instructions (if applicable) and agree that the record reflects my  personal performance and is accurate and complete. 12/01/22   Loretta Held, DO

## 2022-12-02 LAB — CBC WITH DIFFERENTIAL/PLATELET
Absolute Monocytes: 790 cells/uL (ref 200–950)
Basophils Absolute: 52 cells/uL (ref 0–200)
Basophils Relative: 0.5 %
Eosinophils Absolute: 187 cells/uL (ref 15–500)
Eosinophils Relative: 1.8 %
HCT: 42.2 % (ref 35.0–45.0)
Hemoglobin: 13.5 g/dL (ref 11.7–15.5)
Lymphs Abs: 2080 cells/uL (ref 850–3900)
MCH: 28.5 pg (ref 27.0–33.0)
MCHC: 32 g/dL (ref 32.0–36.0)
MCV: 89 fL (ref 80.0–100.0)
MPV: 11 fL (ref 7.5–12.5)
Monocytes Relative: 7.6 %
Neutro Abs: 7290 cells/uL (ref 1500–7800)
Neutrophils Relative %: 70.1 %
Platelets: 305 10*3/uL (ref 140–400)
RBC: 4.74 10*6/uL (ref 3.80–5.10)
RDW: 13.4 % (ref 11.0–15.0)
Total Lymphocyte: 20 %
WBC: 10.4 10*3/uL (ref 3.8–10.8)

## 2022-12-02 LAB — COMPREHENSIVE METABOLIC PANEL
AG Ratio: 1.7 (calc) (ref 1.0–2.5)
ALT: 57 U/L — ABNORMAL HIGH (ref 6–29)
AST: 28 U/L (ref 10–35)
Albumin: 4.1 g/dL (ref 3.6–5.1)
Alkaline phosphatase (APISO): 65 U/L (ref 37–153)
BUN: 20 mg/dL (ref 7–25)
CO2: 26 mmol/L (ref 20–32)
Calcium: 11.3 mg/dL — ABNORMAL HIGH (ref 8.6–10.4)
Chloride: 105 mmol/L (ref 98–110)
Creat: 0.87 mg/dL (ref 0.60–0.95)
Globulin: 2.4 g/dL (calc) (ref 1.9–3.7)
Glucose, Bld: 103 mg/dL — ABNORMAL HIGH (ref 65–99)
Potassium: 4.4 mmol/L (ref 3.5–5.3)
Sodium: 141 mmol/L (ref 135–146)
Total Bilirubin: 0.9 mg/dL (ref 0.2–1.2)
Total Protein: 6.5 g/dL (ref 6.1–8.1)

## 2022-12-07 ENCOUNTER — Ambulatory Visit: Payer: Medicare HMO | Admitting: Cardiology

## 2022-12-08 ENCOUNTER — Ambulatory Visit (INDEPENDENT_AMBULATORY_CARE_PROVIDER_SITE_OTHER): Payer: Medicare HMO | Admitting: Family Medicine

## 2022-12-08 ENCOUNTER — Encounter: Payer: Self-pay | Admitting: Family Medicine

## 2022-12-08 VITALS — BP 140/80 | HR 80 | Temp 97.7°F | Resp 18 | Ht <= 58 in | Wt 193.6 lb

## 2022-12-08 DIAGNOSIS — R079 Chest pain, unspecified: Secondary | ICD-10-CM | POA: Insufficient documentation

## 2022-12-08 DIAGNOSIS — I4891 Unspecified atrial fibrillation: Secondary | ICD-10-CM | POA: Diagnosis not present

## 2022-12-08 DIAGNOSIS — R600 Localized edema: Secondary | ICD-10-CM

## 2022-12-08 NOTE — Progress Notes (Addendum)
Subjective:   By signing my name below, I, Marlana Latus, attest that this documentation has been prepared under the direction and in the presence of Ann Held, DO 12/08/22   Patient ID: Loretta Gates, female    DOB: 1936-03-04, 87 y.o.   MRN: [id number]  Chief Complaint  Patient presents with   Edema   Follow-up    HPI Patient is in today for a 1 week follow up.   She complains of chest pain on 2/27 that lasted about 10-15 minutes before dissipating. She has not had any episodes since. She reports the last episode was a very long time ago. She notes that it happened while at rest and she avoids lying down when this occurs. She will be following up with cardiology next week. She believes it may be partially attributed to anxiety.   She reports her lower extremity edema has improved. She is compliant with her fluid pill. She has been wearing compression socks she had sized in [place]. She requires help to get them on and off.   She has not had energy or strength she previously had. She hopes that it will return if she continues to walk often.   Past Medical History:  Diagnosis Date   Anemia    Atrial fibrillation (HCC)    on Coumadin   Breast mass, right    Cancer (HCC)    LEFT BREAST-Mastectomy   CHF (congestive heart failure) (HCC)    GERD (gastroesophageal reflux disease)    Hypertension    OAB (overactive bladder)    PONV (postoperative nausea and vomiting)     Past Surgical History:  Procedure Laterality Date   BREAST LUMPECTOMY WITH RADIOACTIVE SEED LOCALIZATION Right 10/25/2021   Procedure: RIGHT BREAST LUMPECTOMY WITH RADIOACTIVE SEED LOCALIZATION;  Surgeon: Erroll Luna, MD;  Location: [place];  Service: General;  Laterality: Right;   CHOLECYSTECTOMY     MASTECTOMY Left 2010   OVARIAN CYST REMOVAL     PARATHYROIDECTOMY      Family History  Problem Relation Age of Onset   Sudden death Neg Hx     Social History    Socioeconomic History   Marital status: Married    Spouse name: Not on file   Number of children: Not on file   Years of education: Not on file   Highest education level: Not on file  Occupational History   Not on file  Tobacco Use   Smoking status: Never   Smokeless tobacco: Never  Vaping Use   Vaping Use: Never used  Substance and Sexual Activity   Alcohol use: Never   Drug use: Never   Sexual activity: Not Currently    Birth control/protection: Post-menopausal  Other Topics Concern   Not on file  Social History Narrative   Not on file   Social Determinants of Health   Financial Resource Strain: Low Risk  (01/27/2022)   Overall Financial Resource Strain (CARDIA)    Difficulty of Paying Living Expenses: Not hard at all  Food Insecurity: No Food Insecurity (10/21/2022)   Hunger Vital Sign    Worried About Running Out of Food in the Last Year: Never true    [place] in the Last Year: Never true  Transportation Needs: No Transportation Needs (10/21/2022)   PRAPARE - Hydrologist (Medical): No    Lack of Transportation (Non-Medical): No  Physical Activity: Insufficiently Active (01/27/2022)   Exercise Vital Sign  Days of Exercise per Week: 3 days    Minutes of Exercise per Session: 30 min  Stress: No Stress Concern Present (01/27/2022)   [place]    Feeling of Stress : Not at all  Social Connections: [place] (01/27/2022)   Social Connection and Isolation Panel [NHANES]    Frequency of Communication with Friends and Family: More than three times a week    Frequency of Social Gatherings with Friends and Family: More than three times a week    Attends Religious Services: More than 4 times per year    Active Member of [employer] or Organizations: Yes    Attends Music therapist: More than 4 times per year    Marital Status: Married  Human resources officer  Violence: Not At Risk (10/21/2022)   Humiliation, Afraid, Rape, and Kick questionnaire    Fear of Current or Ex-Partner: No    Emotionally Abused: No    Physically Abused: No    Sexually Abused: No    Outpatient Medications Prior to Visit  Medication Sig Dispense Refill   apixaban (ELIQUIS) 5 MG TABS tablet Take 1 tablet (5 mg total) by mouth 2 (two) times daily. 60 tablet 0   cephALEXin (KEFLEX) 500 MG capsule Take 1 capsule (500 mg total) by mouth 2 (two) times daily. 20 capsule 0   cholestyramine (QUESTRAN) 4 GM/DOSE powder Take 1 packet (4 g total) by mouth See admin instructions. Mix 1 teaspoon in water and take at 6pm daily with warfarin. 378 g 3   denosumab (PROLIA) 60 MG/ML SOSY injection Inject 60 mg into the skin every 6 (six) months.     empagliflozin (JARDIANCE) 10 MG TABS tablet Take 1 tablet (10 mg total) by mouth daily. 30 tablet 0   ergocalciferol (VITAMIN D2) 1.25 MG (50000 UT) capsule Take 50,000 Units by mouth every 14 (fourteen) days. Every other saturday     furosemide (LASIX) 20 MG tablet 2 po qd 60 tablet 5   ipratropium-albuterol (DUONEB) 0.5-2.5 (3) MG/3ML SOLN Take 3 mLs by nebulization every 6 (six) hours as needed. 360 mL 1   irbesartan (AVAPRO) 300 MG tablet Take 1 tablet (300 mg total) by mouth daily. 90 tablet 1   magnesium oxide (MAG-OX) 400 (240 Mg) MG tablet Take 400 mg by mouth daily. Taking 500 mg daily     metoprolol tartrate (LOPRESSOR) 50 MG tablet Take 1.5 tablets (75 mg total) by mouth 2 (two) times daily. 270 tablet 1   NONFORMULARY OR COMPOUNDED ITEM Compression socks  20-30 mmhg  Dx low ext edema 1 each 1   omeprazole (PRILOSEC) 40 MG capsule Take 1 capsule (40 mg total) by mouth 2 (two) times daily before a meal. 180 capsule 1   oseltamivir (TAMIFLU) 30 MG capsule Take 1 capsule (30 mg total) by mouth 2 (two) times daily. 10 capsule 0   oxybutynin (DITROPAN XL) 15 MG 24 hr tablet Take 1 tablet (15 mg total) by mouth at bedtime. 90 tablet 3    promethazine-dextromethorphan (PROMETHAZINE-DM) 6.25-15 MG/5ML syrup Take 2.5-5 mLs by mouth 4 (four) times daily as needed for cough. 118 mL 0   predniSONE (DELTASONE) 20 MG tablet Take 20 mg daily for 3-5 days 5 tablet 0   No facility-administered medications prior to visit.    Allergies  Allergen Reactions   Bee Venom Swelling    Bee stung her on the inside of her gum. Lips and gums started swelling. No  SOB.   Levaquin [Levofloxacin] Hives and Itching   Peanut-Containing Drug Products Other (See Comments)    Stomach pain and diarrhea     Review of Systems  Constitutional:  Negative for fever and malaise/fatigue.  HENT:  Negative for congestion.   Eyes:  Negative for blurred vision.  Respiratory:  Negative for shortness of breath.   Cardiovascular:  Positive for chest pain and leg swelling (has improved). Negative for palpitations.  Gastrointestinal:  Negative for abdominal pain, blood in stool and nausea.  Genitourinary:  Negative for dysuria and frequency.  Musculoskeletal:  Negative for falls.  Skin:  Negative for rash.  Neurological:  Negative for dizziness, loss of consciousness and headaches.  Endo/Heme/Allergies:  Negative for environmental allergies.  Psychiatric/Behavioral:  Negative for depression. The patient is nervous/anxious.        Objective:    Physical Exam Vitals and nursing note reviewed.  Constitutional:      Appearance: She is well-developed.  HENT:     Head: Normocephalic and atraumatic.  Eyes:     Conjunctiva/sclera: Conjunctivae normal.  Neck:     Thyroid: No thyromegaly.     Vascular: No carotid bruit or JVD.  Cardiovascular:     Rate and Rhythm: Normal rate. Rhythm irregularly irregular.     Heart sounds: Normal heart sounds. No murmur heard. Pulmonary:     Effort: Pulmonary effort is normal. No respiratory distress.     Breath sounds: Normal breath sounds. No wheezing or rales.  Chest:     Chest wall: No tenderness.  Musculoskeletal:         General: No swelling.     Cervical back: Normal range of motion and neck supple.     Comments: Pt has compression socks on   Neurological:     Mental Status: She is alert and oriented to person, place, and time.     BP (!) 140/80 (BP Location: Right Arm, Patient Position: Sitting, Cuff Size: Large)   Pulse 80   Temp 97.7 F (36.5 C) (Oral)   Resp 18   Ht '4\' 10"'$  (1.473 m)   Wt 193 lb 9.6 oz (87.8 kg)   SpO2 97%   BMI 40.46 kg/m  Wt Readings from Last 3 Encounters:  12/08/22 193 lb 9.6 oz (87.8 kg)  12/01/22 195 lb 12.8 oz (88.8 kg)  11/20/22 195 lb (88.5 kg)       Assessment & Plan:  Chest pain, unspecified type Assessment & Plan: Lasted 15 min  Has f/u cardiology Monday EKG ---  a fib  If chest pain returns-- go to ER   Orders: -     EKG 12-Lead  Lower extremity edema Assessment & Plan: Elevate legs Compression socks  Con't lasix Edema is much improved    Atrial fibrillation, unspecified type [facility]) Assessment & Plan: On eliquis  Has f/u with cardiology Monday       I,Rachel Rivera,acting as a scribe for Ann Held, DO.,have documented all relevant documentation on the behalf of Ann Held, DO,as directed by  Ann Held, DO while in the presence of Ann Held, DO.   I, Ann Held, DO, personally preformed the services described in this documentation.  All medical record entries made by the scribe were at my direction and in my presence.  I have reviewed the chart and discharge instructions (if applicable) and agree that the record reflects my personal performance and is accurate and complete. 12/08/22  Ann Held, DO

## 2022-12-08 NOTE — Assessment & Plan Note (Signed)
On eliquis  Has f/u with cardiology Monday

## 2022-12-08 NOTE — Assessment & Plan Note (Signed)
Elevate legs Compression socks  Con't lasix Edema is much improved

## 2022-12-08 NOTE — Assessment & Plan Note (Addendum)
Lasted 15 min  Has f/u cardiology Monday EKG ---  a fib  If chest pain returns-- go to ER

## 2022-12-11 ENCOUNTER — Encounter: Payer: Self-pay | Admitting: Cardiology

## 2022-12-11 ENCOUNTER — Ambulatory Visit: Payer: Medicare HMO | Attending: Cardiology | Admitting: Cardiology

## 2022-12-11 VITALS — BP 126/78 | HR 83 | Ht 59.0 in | Wt 193.2 lb

## 2022-12-11 DIAGNOSIS — I480 Paroxysmal atrial fibrillation: Secondary | ICD-10-CM | POA: Diagnosis not present

## 2022-12-11 DIAGNOSIS — I10 Essential (primary) hypertension: Secondary | ICD-10-CM

## 2022-12-11 DIAGNOSIS — I5030 Unspecified diastolic (congestive) heart failure: Secondary | ICD-10-CM

## 2022-12-11 NOTE — Patient Instructions (Signed)
    Follow-Up: At [facility], you and your health needs are our priority.  As part of our continuing mission to provide you with exceptional heart care, we have created designated Provider Care Teams.  These Care Teams include your primary Cardiologist (physician) and Advanced Practice Providers (APPs -  Physician Assistants and Nurse Practitioners) who all work together to provide you with the care you need, when you need it.  We recommend signing up for the patient portal called "MyChart".  Sign up information is provided on this After Visit Summary.  MyChart is used to connect with patients for Virtual Visits (Telemedicine).  Patients are able to view lab/test results, encounter notes, upcoming appointments, etc.  Non-urgent messages can be sent to your provider as well.   To learn more about what you can do with MyChart, go to [email or web address].    Your next appointment:   3 month(s)  Provider:   Kirk Ruths, MD

## 2022-12-14 ENCOUNTER — Other Ambulatory Visit: Payer: Self-pay | Admitting: Family Medicine

## 2022-12-14 ENCOUNTER — Encounter: Payer: Self-pay | Admitting: Cardiology

## 2022-12-14 DIAGNOSIS — I480 Paroxysmal atrial fibrillation: Secondary | ICD-10-CM

## 2022-12-14 DIAGNOSIS — R600 Localized edema: Secondary | ICD-10-CM

## 2022-12-15 ENCOUNTER — Other Ambulatory Visit: Payer: Self-pay | Admitting: *Deleted

## 2022-12-15 DIAGNOSIS — I480 Paroxysmal atrial fibrillation: Secondary | ICD-10-CM

## 2022-12-15 NOTE — Telephone Encounter (Signed)
RN spoke to patient and daughter [name]. Both had question in regards to having a cardioversion.  Wanted to know about the actual  procedures - where to go ,what to do, and  what is needed.  What medication should she be taking  prior or after the procedures.   Rn answered all question . Both verbalized understanding. Both aware that patient can not miss any dose of Elqiuis  before  cardioversion- the procedure will be cancelled.  They were both concerned about blood clots , since [employer] mention it several times.    Per [name], patient would like to proceed with the cardioversion. They are not available  March 28 or April 4 through the April 13th.  RN informed patient and daughter if cardioversion is schedule before December 30, 2022, she will not need lab work. They are aware that procedure may not be able to be done by then.  Aware will contact with schedule date and instructions

## 2022-12-15 NOTE — Addendum Note (Signed)
Addended by: Cristopher Estimable on: 12/15/2022 03:26 PM   Modules accepted: Orders

## 2022-12-22 DIAGNOSIS — I480 Paroxysmal atrial fibrillation: Secondary | ICD-10-CM | POA: Diagnosis not present

## 2022-12-23 ENCOUNTER — Other Ambulatory Visit: Payer: Self-pay | Admitting: Family Medicine

## 2022-12-23 LAB — CBC
Hematocrit: 46.6 % (ref 34.0–46.6)
Hemoglobin: 15 g/dL (ref 11.1–15.9)
MCH: 28.5 pg (ref 26.6–33.0)
MCHC: 32.2 g/dL (ref 31.5–35.7)
MCV: 89 fL (ref 79–97)
Platelets: 292 10*3/uL (ref 150–450)
RBC: 5.26 x10E6/uL (ref 3.77–5.28)
RDW: 13.8 % (ref 11.7–15.4)
WBC: 9.2 10*3/uL (ref 3.4–10.8)

## 2022-12-23 LAB — BASIC METABOLIC PANEL
BUN/Creatinine Ratio: 23 (ref 12–28)
BUN: 19 mg/dL (ref 8–27)
CO2: 21 mmol/L (ref 20–29)
Calcium: 11.3 mg/dL — ABNORMAL HIGH (ref 8.7–10.3)
Chloride: 104 mmol/L (ref 96–106)
Creatinine, Ser: 0.81 mg/dL (ref 0.57–1.00)
Glucose: 94 mg/dL (ref 70–99)
Potassium: 4.7 mmol/L (ref 3.5–5.2)
Sodium: 141 mmol/L (ref 134–144)
eGFR: 72 mL/min/{1.73_m2} (ref >59)

## 2022-12-25 ENCOUNTER — Encounter: Payer: Self-pay | Admitting: Family Medicine

## 2023-01-01 ENCOUNTER — Ambulatory Visit (HOSPITAL_BASED_OUTPATIENT_CLINIC_OR_DEPARTMENT_OTHER): Payer: Medicare HMO | Admitting: Anesthesiology

## 2023-01-01 ENCOUNTER — Encounter (HOSPITAL_COMMUNITY): Payer: Self-pay | Admitting: Cardiology

## 2023-01-01 ENCOUNTER — Ambulatory Visit (HOSPITAL_COMMUNITY): Payer: Medicare HMO | Admitting: Anesthesiology

## 2023-01-01 ENCOUNTER — Ambulatory Visit (HOSPITAL_COMMUNITY)
Admission: RE | Admit: 2023-01-01 | Discharge: 2023-01-01 | Disposition: A | Discharge status: Home / Self Care | Payer: Medicare HMO | Attending: Cardiology | Admitting: Cardiology

## 2023-01-01 ENCOUNTER — Other Ambulatory Visit: Payer: Self-pay

## 2023-01-01 ENCOUNTER — Encounter (HOSPITAL_COMMUNITY)
Admission: RE | Disposition: A | Discharge status: Home / Self Care | Payer: Self-pay | Source: Home / Self Care | Attending: Cardiology

## 2023-01-01 DIAGNOSIS — I480 Paroxysmal atrial fibrillation: Secondary | ICD-10-CM

## 2023-01-01 DIAGNOSIS — I110 Hypertensive heart disease with heart failure: Secondary | ICD-10-CM | POA: Insufficient documentation

## 2023-01-01 DIAGNOSIS — D649 Anemia, unspecified: Secondary | ICD-10-CM | POA: Diagnosis not present

## 2023-01-01 DIAGNOSIS — Z7984 Long term (current) use of oral hypoglycemic drugs: Secondary | ICD-10-CM | POA: Insufficient documentation

## 2023-01-01 DIAGNOSIS — I509 Heart failure, unspecified: Secondary | ICD-10-CM | POA: Diagnosis not present

## 2023-01-01 DIAGNOSIS — I272 Pulmonary hypertension, unspecified: Secondary | ICD-10-CM | POA: Insufficient documentation

## 2023-01-01 DIAGNOSIS — I4891 Unspecified atrial fibrillation: Secondary | ICD-10-CM | POA: Diagnosis not present

## 2023-01-01 DIAGNOSIS — I5032 Chronic diastolic (congestive) heart failure: Secondary | ICD-10-CM | POA: Diagnosis not present

## 2023-01-01 DIAGNOSIS — K219 Gastro-esophageal reflux disease without esophagitis: Secondary | ICD-10-CM | POA: Diagnosis not present

## 2023-01-01 DIAGNOSIS — Z79899 Other long term (current) drug therapy: Secondary | ICD-10-CM | POA: Insufficient documentation

## 2023-01-01 DIAGNOSIS — I4819 Other persistent atrial fibrillation: Secondary | ICD-10-CM

## 2023-01-01 HISTORY — PX: CARDIOVERSION: SHX1299

## 2023-01-01 SURGERY — CARDIOVERSION
Anesthesia: General

## 2023-01-01 MED ORDER — PROPOFOL 10 MG/ML IV BOLUS
INTRAVENOUS | Status: DC | PRN
  Administered 2023-01-01: 60 mg via INTRAVENOUS

## 2023-01-01 MED ORDER — SODIUM CHLORIDE 0.9 % IV SOLN: INTRAVENOUS | Status: DC

## 2023-01-01 MED ORDER — LIDOCAINE 2% (20 MG/ML) 5 ML SYRINGE
INTRAMUSCULAR | Status: DC | PRN
  Administered 2023-01-01: 60 mg via INTRAVENOUS

## 2023-01-01 NOTE — Anesthesia Postprocedure Evaluation (Signed)
Anesthesia Post Note  Patient: Loretta Gates  Procedure(s) Performed: CARDIOVERSION     Patient location during evaluation: PACU Anesthesia Type: General Level of consciousness: awake and alert Pain management: pain level controlled Vital Signs Assessment: post-procedure vital signs reviewed and stable Respiratory status: spontaneous breathing, nonlabored ventilation, respiratory function stable and patient connected to nasal cannula oxygen Cardiovascular status: blood pressure returned to baseline and stable Postop Assessment: no apparent nausea or vomiting Anesthetic complications: no   No notable events documented.  Last Vitals:  Vitals:   01/01/23 1021 01/01/23 1030  BP:  109/66  Pulse:  (!) 58  Resp:  17  Temp: 36.5 C   SpO2:  92%    Last Pain:  Vitals:   01/01/23 1030  TempSrc:   PainSc: 0-No pain                 Belenda Cruise P Kaidynce Pfister

## 2023-01-01 NOTE — H&P (Signed)
Office Visit 12/11/2022 [facility] at [facility], Denice Bors, MD Cardiology PAF (paroxysmal atrial fibrillation) [facility]) +2 more Dx Follow-up  Congestive Heart Failure  Atrial Fibrillation; Referred by Consuelo Pandy, PA-C Reason for Visit   Additional Documentation  Vitals: BP 126/78   Pulse 83   Ht 4\' 11"  (1.499 m)   Wt 87.6 kg   SpO2 96%   BMI 39.02 kg/m   BSA 1.91 m      More Vitals  Flowsheets: Anthropometrics,   NEWS,   MEWS Score  Encounter Info: Billing Info,   History,   Allergies,   Detailed Report   All Notes   Progress Notes by Lelon Perla, MD at 12/11/2022 10:40 AM  Author: Lelon Perla, MD Author Type: Physician Filed: 12/11/2022 11:41 AM  Note Status: Signed Cosign: Cosign Not Required Encounter Date: 12/11/2022  Editor: Lelon Perla, MD (Physician)                    Cecile Hearing, MD Reason for referral-atrial fibrillation and congestive heart failure   HPI: 87 year old female for evaluation of atrial fibrillation and congestive heart failure at request of Lamar Blinks, MD.  Patient previously followed in [facility].  Echocardiogram January 2024 showed normal LV function, mild right ventricular enlargement, moderate pulmonary hypertension, moderate left atrial enlargement, mild right atrial enlargement, mild mitral regurgitation, mild to moderate tricuspid regurgitation.  Patient was admitted January 2024 with CHF.  Patient was in atrial fibrillation at that time and placed on Cardizem and metoprolol increased.  She was diuresed as well.  She was discharged and outpatient follow-up scheduled with me.  Of note she did have an electrocardiogram July 19, 2022 that by report showed sinus rhythm.  Since last seen she notes some dyspnea on exertion but no orthopnea or PND.  She has chronic pedal edema that may be slightly worse.  She has slightly decreased energy.  She has some chest  discomfort after eating peanut butter last evening but denies exertional chest pain.  She denies palpitations, syncope, melena or hematochezia.         Current Outpatient Medications  Medication Sig Dispense Refill   apixaban (ELIQUIS) 5 MG TABS tablet Take 1 tablet (5 mg total) by mouth 2 (two) times daily. 60 tablet 0   cholestyramine (QUESTRAN) 4 GM/DOSE powder Take 1 packet (4 g total) by mouth See admin instructions. Mix 1 teaspoon in water and take at 6pm daily with warfarin. 378 g 3   denosumab (PROLIA) 60 MG/ML SOSY injection Inject 60 mg into the skin every 6 (six) months.       empagliflozin (JARDIANCE) 10 MG TABS tablet Take 1 tablet (10 mg total) by mouth daily. 30 tablet 0   ergocalciferol (VITAMIN D2) 1.25 MG (50000 UT) capsule Take 50,000 Units by mouth every 14 (fourteen) days. Every other saturday       furosemide (LASIX) 20 MG tablet 2 po qd 60 tablet 5   irbesartan (AVAPRO) 300 MG tablet Take 1 tablet (300 mg total) by mouth daily. 90 tablet 1   magnesium oxide (MAG-OX) 400 (240 Mg) MG tablet Take 400 mg by mouth daily. Taking 500 mg daily       metoprolol tartrate (LOPRESSOR) 50 MG tablet Take 1.5 tablets (75 mg total) by mouth 2 (two) times daily. 270 tablet 1   NONFORMULARY OR COMPOUNDED ITEM Compression socks  20-30 mmhg  Dx low ext edema 1 each  1   omeprazole (PRILOSEC) 40 MG capsule Take 1 capsule (40 mg total) by mouth 2 (two) times daily before a meal. 180 capsule 1   oxybutynin (DITROPAN XL) 15 MG 24 hr tablet Take 1 tablet (15 mg total) by mouth at bedtime. 90 tablet 3    No current facility-administered medications for this visit.           Allergies  Allergen Reactions   Bee Venom Swelling      Bee stung her on the inside of her gum. Lips and gums started swelling. No SOB.   Levaquin [Levofloxacin] Hives and Itching   Peanut-Containing Drug Products Other (See Comments)      Stomach pain and diarrhea            Past Medical History:  Diagnosis Date    Anemia     Atrial fibrillation (HCC)      on Coumadin   Breast mass, right     Cancer (HCC)      LEFT BREAST-Mastectomy   CHF (congestive heart failure) (HCC)     GERD (gastroesophageal reflux disease)     Hypertension     OAB (overactive bladder)     PONV (postoperative nausea and vomiting)             Past Surgical History:  Procedure Laterality Date   BREAST LUMPECTOMY WITH RADIOACTIVE SEED LOCALIZATION Right 10/25/2021    Procedure: RIGHT BREAST LUMPECTOMY WITH RADIOACTIVE SEED LOCALIZATION;  Surgeon: Erroll Luna, MD;  Location: [place];  Service: General;  Laterality: Right;   CHOLECYSTECTOMY       MASTECTOMY Left 2010   OVARIAN CYST REMOVAL       PARATHYROIDECTOMY          Social History         Socioeconomic History   Marital status: Married      Spouse name: Not on file   Number of children: Not on file   Years of education: Not on file   Highest education level: Not on file  Occupational History   Not on file  Tobacco Use   Smoking status: Never   Smokeless tobacco: Never  Vaping Use   Vaping Use: Never used  Substance and Sexual Activity   Alcohol use: Never   Drug use: Never   Sexual activity: Not Currently      Birth control/protection: Post-menopausal  Other Topics Concern   Not on file  Social History Narrative   Not on file    Social Determinants of Health        Financial Resource Strain: Low Risk  (01/27/2022)    Overall Financial Resource Strain (CARDIA)     Difficulty of Paying Living Expenses: Not hard at all  Food Insecurity: No Food Insecurity (10/21/2022)    Hunger Vital Sign     Worried About Running Out of Food in the Last Year: Never true     Ran Out of Food in the Last Year: Never true  Transportation Needs: No Transportation Needs (10/21/2022)    PRAPARE - Armed forces logistics/support/administrative officer (Medical): No     Lack of Transportation (Non-Medical): No  Physical Activity: Insufficiently Active (01/27/2022)     Exercise Vital Sign     Days of Exercise per Week: 3 days     Minutes of Exercise per Session: 30 min  Stress: No Stress Concern Present (01/27/2022)    [place]  Feeling of Stress : Not at all  Social Connections: Socially Integrated (01/27/2022)    Social Connection and Isolation Panel [NHANES]     Frequency of Communication with Friends and Family: More than three times a week     Frequency of Social Gatherings with Friends and Family: More than three times a week     Attends Religious Services: More than 4 times per year     Active Member of [employer] or Organizations: Yes     Attends Music therapist: More than 4 times per year     Marital Status: Married  Human resources officer Violence: Not At Risk (10/21/2022)    Humiliation, Afraid, Rape, and Kick questionnaire     Fear of Current or Ex-Partner: No     Emotionally Abused: No     Physically Abused: No     Sexually Abused: No           Family History  Problem Relation Age of Onset   Sudden death Neg Hx        ROS: no fevers or chills, productive cough, hemoptysis, dysphasia, odynophagia, melena, hematochezia, dysuria, hematuria, rash, seizure activity, orthopnea, PND, claudication. Remaining systems are negative.   Physical Exam:    Blood pressure 126/78, pulse 83, height 4\' 11"  (1.499 m), weight 193 lb 3.2 oz (87.6 kg), SpO2 96 %.   General:  Well developed/well nourished in NAD Skin warm/dry Patient not depressed No peripheral clubbing Back-normal HEENT-normal/normal eyelids Neck supple/normal carotid upstroke bilaterally; no bruits; no JVD; no thyromegaly chest - CTA/ normal expansion CV -irregular Abdomen -NT/ND, no HSM, no mass, + bowel sounds, no bruit 2+ femoral pulses, no bruits Ext-trace to 1+ ankle edema, chords, 2+ DP Neuro-grossly nonfocal   ECG -atrial fibrillation at a rate of 71, no ST changes.  Personally reviewed    A/P   1 persistent atrial fibrillation-patient did have an electrocardiogram July 19, 2022 that showed sinus rhythm.  Long discussion today concerning rhythm control versus rate control.  I think she is symptomatic with some increased dyspnea on exertion and decreased energy.  We will continue metoprolol for rate control.  Continue apixaban.  I would like to proceed with cardioversion to see if she will hold sinus rhythm and to see if it would improve her symptoms.  She would like to consider this and then she will contact us if she is agreeable to proceed.  Note her LV function is normal.   2 chronic diastolic congestive heart failure-she is euvolemic on examination.  Will continue diuretic at present dose.  Continue Jardiance.   3 hypertension-blood pressure controlled.  Continue present medications and follow-up.   Kirk Ruths, MD      For DCCV; compliant with apixaban; no changes Kirk Ruths

## 2023-01-01 NOTE — Interval H&P Note (Signed)
History and Physical Interval Note:  01/01/2023 9:41 AM  Loretta Gates  has presented today for surgery, with the diagnosis of AFIB.  The various methods of treatment have been discussed with the patient and family. After consideration of risks, benefits and other options for treatment, the patient has consented to  Procedure(s): CARDIOVERSION (N/A) as a surgical intervention.  The patient's history has been reviewed, patient examined, no change in status, stable for surgery.  I have reviewed the patient's chart and labs.  Questions were answered to the patient's satisfaction.     Kirk Ruths

## 2023-01-01 NOTE — Anesthesia Procedure Notes (Signed)
Procedure Name: General with mask airway Date/Time: 01/01/2023 10:21 AM  Performed by: Valda Favia, CRNAPre-anesthesia Checklist: Patient identified, Emergency Drugs available, Suction available, Patient being monitored and Timeout performed Patient Re-evaluated:Patient Re-evaluated prior to induction Oxygen Delivery Method: Ambu bag Preoxygenation: Pre-oxygenation with 100% oxygen Induction Type: IV induction Ventilation: Mask ventilation without difficulty Placement Confirmation: positive ETCO2 Dental Injury: Teeth and Oropharynx as per pre-operative assessment

## 2023-01-01 NOTE — Discharge Instructions (Signed)

## 2023-01-01 NOTE — Anesthesia Preprocedure Evaluation (Addendum)
Anesthesia Evaluation  Patient identified by MRN, date of birth, ID band Patient awake    Reviewed: Allergy & Precautions, NPO status , Patient's Chart, lab work & pertinent test results  History of Anesthesia Complications (+) PONV and history of anesthetic complications  Airway Mallampati: II  TM Distance: >3 FB Neck ROM: Full    Dental no notable dental hx.    Pulmonary neg pulmonary ROS   Pulmonary exam normal        Cardiovascular hypertension, Pt. on medications and Pt. on home beta blockers +CHF  + dysrhythmias Atrial Fibrillation  Rhythm:Irregular Rate:Normal     Neuro/Psych negative neurological ROS  negative psych ROS   GI/Hepatic Neg liver ROS,GERD  Medicated,,  Endo/Other  negative endocrine ROS    Renal/GU negative Renal ROS  negative genitourinary   Musculoskeletal negative musculoskeletal ROS (+)    Abdominal Normal abdominal exam  (+)   Peds  Hematology  (+) Blood dyscrasia, anemia   Anesthesia Other Findings   Reproductive/Obstetrics                             Anesthesia Physical Anesthesia Plan  ASA: 3  Anesthesia Plan: General   Post-op Pain Management:    Induction: Intravenous  PONV Risk Score and Plan: 4 or greater and Propofol infusion and Treatment may vary due to age or medical condition  Airway Management Planned: Simple Face Mask and Nasal Cannula  Additional Equipment: None  Intra-op Plan:   Post-operative Plan:   Informed Consent: I have reviewed the patients History and Physical, chart, labs and discussed the procedure including the risks, benefits and alternatives for the proposed anesthesia with the patient or authorized representative who has indicated his/her understanding and acceptance.     Dental advisory given  Plan Discussed with:   Anesthesia Plan Comments:        Anesthesia Quick Evaluation

## 2023-01-01 NOTE — Transfer of Care (Signed)
Immediate Anesthesia Transfer of Care Note  Patient: Loretta Gates  Procedure(s) Performed: CARDIOVERSION  Patient Location: Endoscopy Unit  Anesthesia Type:General  Level of Consciousness: drowsy  Airway & Oxygen Therapy: Patient Spontanous Breathing  Post-op Assessment: Report given to RN and Post -op Vital signs reviewed and stable  Post vital signs: Reviewed and stable  Last Vitals:  Vitals Value Taken Time  BP 117/55 01/01/23 1021  Temp    Pulse 58 01/01/23 1021  Resp 19 01/01/23 1021  SpO2 96 % 01/01/23 1021  Vitals shown include unvalidated device data.  Last Pain:  Vitals:   01/01/23 0900  TempSrc: Temporal  PainSc: 0-No pain         Complications: No notable events documented.

## 2023-01-01 NOTE — CV Procedure (Signed)
Electrical Cardioversion Procedure Note MICKY RODDA [id number] [birth year]  Procedure: Electrical Cardioversion Indications:  Atrial Fibrillation  Procedure Details Consent: Risks of procedure as well as the alternatives and risks of each were explained to the (patient/caregiver).  Consent for procedure obtained. Time Out: Verified patient identification, verified procedure, site/side was marked, verified correct patient position, special equipment/implants available, medications/allergies/relevent history reviewed, required imaging and test results available.  Performed  Patient placed on cardiac monitor, pulse oximetry, supplemental oxygen as necessary.  Sedation given:  Pt sedated by anesthesia with lidocaine 60 mg and 60 diprovan mg IV. Pacer pads placed anterior and posterior chest.  Cardioverted 1 time(s).  Cardioverted at [place].  Evaluation Findings: Post procedure EKG shows: NSR Complications: None Patient did tolerate procedure well.   Kirk Ruths 01/01/2023, 9:34 AM

## 2023-01-02 ENCOUNTER — Encounter (HOSPITAL_COMMUNITY): Payer: Self-pay | Admitting: Cardiology

## 2023-01-03 DIAGNOSIS — E213 Hyperparathyroidism, unspecified: Secondary | ICD-10-CM | POA: Diagnosis not present

## 2023-01-03 DIAGNOSIS — E559 Vitamin D deficiency, unspecified: Secondary | ICD-10-CM | POA: Diagnosis not present

## 2023-01-03 DIAGNOSIS — M810 Age-related osteoporosis without current pathological fracture: Secondary | ICD-10-CM | POA: Diagnosis not present

## 2023-01-08 NOTE — Progress Notes (Unsigned)
Office Visit Note   Patient: Loretta Gates           Date of Birth: Feb 13, 1936           MRN: [id number] Visit Date: 01/09/2023              Requested by: Darreld Mclean, MD [address] [place],  [place] [address] PCP: Darreld Mclean, MD   Assessment & Plan: Visit Diagnoses:  1. Bilateral primary osteoarthritis of knee     Plan: Impression is by lateral knee osteoarthritis.  Steroid injections administered in both knees.  She tolerated this well.  Will see her back as needed.  Follow-Up Instructions: No follow-ups on file.   Orders:  No orders of the defined types were placed in this encounter.  No orders of the defined types were placed in this encounter.     Procedures: Large Joint Inj: bilateral knee on 01/09/2023 9:51 AM Indications: pain Details: 22 G needle  Arthrogram: No  Medications (Right): 2 mL lidocaine 1 %; 2 mL bupivacaine 0.5 %; 40 mg methylPREDNISolone acetate 40 MG/ML Medications (Left): 2 mL lidocaine 1 %; 2 mL bupivacaine 0.5 %; 40 mg methylPREDNISolone acetate 40 MG/ML Outcome: tolerated well, no immediate complications Patient was prepped and draped in the usual sterile fashion.       Clinical Data: No additional findings.   Subjective: Chief Complaint  Patient presents with   Right Knee - Pain   Left Knee - Pain    HPI  Loretta Gates returns today for bilateral knee pain due to severe OA.  Review of Systems  Constitutional: Negative.   HENT: Negative.    Eyes: Negative.   Respiratory: Negative.    Cardiovascular: Negative.   Endocrine: Negative.   Musculoskeletal: Negative.   Neurological: Negative.   Hematological: Negative.   Psychiatric/Behavioral: Negative.    All other systems reviewed and are negative.    Objective: Vital Signs: There were no vitals taken for this visit.  Physical Exam Vitals and nursing note reviewed.  Constitutional:      Appearance: She is well-developed.  HENT:      Head: Normocephalic and atraumatic.  Pulmonary:     Effort: Pulmonary effort is normal.  Abdominal:     Palpations: Abdomen is soft.  Musculoskeletal:     Cervical back: Neck supple.  Skin:    General: Skin is warm.     Capillary Refill: Capillary refill takes less than 2 seconds.  Neurological:     Mental Status: She is alert and oriented to person, place, and time.  Psychiatric:        Behavior: Behavior normal.        Thought Content: Thought content normal.        Judgment: Judgment normal.     Ortho Exam  Knee exam is unchanged  Specialty Comments:  No specialty comments available.  Imaging: No results found.   PMFS History: Patient Active Problem List   Diagnosis Date Noted   Chest pain 12/08/2022   Class 3 obesity 10/21/2022   Acute on chronic diastolic CHF (congestive heart failure) 10/20/2022   IDA (iron deficiency anemia) 01/13/2022   Atrial fibrillation 01/05/2022   Breast cancer 01/05/2022   Primary hyperparathyroidism 01/05/2022   Essential hypertension 01/05/2022   Age-related nuclear cataract of both eyes 12/11/2018   Abnormal INR 01/29/2018   Long term (current) use of anticoagulants 01/29/2018   Encounter for therapeutic drug monitoring 01/29/2018   Vitamin  D deficiency 05/03/2017   S/P laparoscopic cholecystectomy 02/26/2017   Calculus of gallbladder without cholecystitis without obstruction 02/23/2017   Diarrhea of presumed infectious origin 02/23/2017   Hyperglycemia 02/23/2017   Right upper quadrant abdominal pain 02/23/2017   Lower extremity edema 01/18/2016   Age-related osteoporosis without current pathological fracture 01/17/2016   Diverticulosis of large intestine without hemorrhage 01/17/2016   GERD (gastroesophageal reflux disease) 01/17/2016   History of left breast cancer 01/17/2016   Multinodular goiter 01/17/2016   OAB (overactive bladder) 01/17/2016   History of cancer chemotherapy 03/06/2015   History of left mastectomy  03/06/2015   Malignant neoplasm of overlapping sites of left female breast 03/06/2015   Past Medical History:  Diagnosis Date   Anemia    Atrial fibrillation    on Coumadin   Breast mass, right    Cancer    LEFT BREAST-Mastectomy   CHF (congestive heart failure)    GERD (gastroesophageal reflux disease)    Hypertension    OAB (overactive bladder)    PONV (postoperative nausea and vomiting)     Family History  Problem Relation Age of Onset   Sudden death Neg Hx     Past Surgical History:  Procedure Laterality Date   BREAST LUMPECTOMY WITH RADIOACTIVE SEED LOCALIZATION Right 10/25/2021   Procedure: RIGHT BREAST LUMPECTOMY WITH RADIOACTIVE SEED LOCALIZATION;  Surgeon: Erroll Luna, MD;  Location: [place];  Service: General;  Laterality: Right;   CARDIOVERSION N/A 01/01/2023   Procedure: CARDIOVERSION;  Surgeon: Lelon Perla, MD;  [facility] ENDOSCOPY;  Service: Cardiovascular;  Laterality: N/A;   CHOLECYSTECTOMY     MASTECTOMY Left 2010   OVARIAN CYST REMOVAL     PARATHYROIDECTOMY     Social History   Occupational History   Not on file  Tobacco Use   Smoking status: Never   Smokeless tobacco: Never  Vaping Use   Vaping Use: Never used  Substance and Sexual Activity   Alcohol use: Never   Drug use: Never   Sexual activity: Not Currently    Birth control/protection: Post-menopausal

## 2023-01-09 ENCOUNTER — Encounter: Payer: Self-pay | Admitting: Family Medicine

## 2023-01-09 ENCOUNTER — Encounter: Payer: Self-pay | Admitting: Orthopaedic Surgery

## 2023-01-09 ENCOUNTER — Ambulatory Visit: Payer: Medicare HMO | Admitting: Orthopaedic Surgery

## 2023-01-09 DIAGNOSIS — M1711 Unilateral primary osteoarthritis, right knee: Secondary | ICD-10-CM

## 2023-01-09 DIAGNOSIS — M170 Bilateral primary osteoarthritis of knee: Secondary | ICD-10-CM

## 2023-01-09 DIAGNOSIS — R600 Localized edema: Secondary | ICD-10-CM

## 2023-01-09 DIAGNOSIS — M1712 Unilateral primary osteoarthritis, left knee: Secondary | ICD-10-CM | POA: Diagnosis not present

## 2023-01-09 MED ORDER — METHYLPREDNISOLONE ACETATE 40 MG/ML IJ SUSP
40.0000 mg | INTRAMUSCULAR | Status: AC | PRN
  Administered 2023-01-09: 40 mg via INTRA_ARTICULAR

## 2023-01-09 MED ORDER — LIDOCAINE HCL 1 % IJ SOLN
2.0000 mL | INTRAMUSCULAR | Status: AC | PRN
  Administered 2023-01-09: 2 mL

## 2023-01-09 MED ORDER — BUPIVACAINE HCL 0.5 % IJ SOLN
2.0000 mL | INTRAMUSCULAR | Status: AC | PRN
  Administered 2023-01-09: 2 mL via INTRA_ARTICULAR

## 2023-01-10 ENCOUNTER — Ambulatory Visit: Payer: Medicare HMO | Admitting: Orthopaedic Surgery

## 2023-01-10 MED ORDER — FUROSEMIDE 20 MG PO TABS: ORAL_TABLET | ORAL | 3 refills | Status: DC

## 2023-01-10 NOTE — Addendum Note (Signed)
Addended by: Lamar Blinks C on: 01/10/2023 02:54 PM   Modules accepted: Orders

## 2023-01-10 NOTE — Addendum Note (Signed)
Addended by: Lamar Blinks C on: 01/10/2023 08:44 AM   Modules accepted: Orders

## 2023-01-22 DIAGNOSIS — R310 Gross hematuria: Secondary | ICD-10-CM | POA: Diagnosis not present

## 2023-01-22 DIAGNOSIS — K5730 Diverticulosis of large intestine without perforation or abscess without bleeding: Secondary | ICD-10-CM | POA: Diagnosis not present

## 2023-01-22 DIAGNOSIS — R3129 Other microscopic hematuria: Secondary | ICD-10-CM | POA: Diagnosis not present

## 2023-01-26 ENCOUNTER — Other Ambulatory Visit: Payer: Self-pay | Admitting: Urology

## 2023-01-26 ENCOUNTER — Telehealth: Payer: Self-pay | Admitting: *Deleted

## 2023-01-26 DIAGNOSIS — D414 Neoplasm of uncertain behavior of bladder: Secondary | ICD-10-CM | POA: Diagnosis not present

## 2023-01-26 DIAGNOSIS — R310 Gross hematuria: Secondary | ICD-10-CM | POA: Diagnosis not present

## 2023-01-26 NOTE — Telephone Encounter (Signed)
   Pre-operative Risk Assessment    Patient Name: Loretta Gates  DOB: [birth year] MRN: [id number]      Request for Surgical Clearance    Procedure:   Turbt and Gematabine.  Date of Surgery:  Clearance 02/05/23                                 Surgeon:  Dr. Estrellita Ludwig Surgeon's Group or Practice Name:  [facility]  Phone number:  [phone] Fax number:  [phone]   Type of Clearance Requested:   - Medical  - Pharmacy:  Hold Apixaban (Eliquis) 5 days prior.    Type of Anesthesia:  Not Indicated   Additional requests/questions:    Signed, Emmit Pomfret   01/26/2023, 10:52 AM

## 2023-01-29 ENCOUNTER — Telehealth: Payer: Self-pay | Admitting: *Deleted

## 2023-01-29 NOTE — Telephone Encounter (Signed)
Primary Cardiologist:Brian Jens Som, MD   Preoperative team, please contact this patient and set up a phone call appointment for further preoperative risk assessment. Please obtain consent and complete medication review. Thank you for your help.   DCCV - 01/01/2023 4 weeks post procedure uninterrupted anticoagulation ends 01/29/2023 Elimination half life of ~12 hrs, 5 days hold would be too long. patient can hold Eliquis for 3 days prior to procedure.    Levi Aland, NP-C  01/29/2023, 9:53 AM [address], [address] Office [phone] Fax [phone]

## 2023-01-29 NOTE — Telephone Encounter (Signed)
Pt has been scheduled for tele pre op appt 02/01/23 @ 3:20, add on due to med hold and date of procedure.   Med rec and consent are done.     Patient Consent for Virtual Visit        Loretta Gates has provided verbal consent on 01/29/2023 for a virtual visit (video or telephone).   CONSENT FOR VIRTUAL VISIT FOR:  Loretta Gates  By participating in this virtual visit I agree to the following:  I hereby voluntarily request, consent and authorize [facility] and its employed or contracted physicians, physician assistants, nurse practitioners or other licensed health care professionals (the Practitioner), to provide me with telemedicine health care services (the "Services") as deemed necessary by the treating Practitioner. I acknowledge and consent to receive the Services by the Practitioner via telemedicine. I understand that the telemedicine visit will involve communicating with the Practitioner through live audiovisual communication technology and the disclosure of certain medical information by electronic transmission. I acknowledge that I have been given the opportunity to request an in-person assessment or other available alternative prior to the telemedicine visit and am voluntarily participating in the telemedicine visit.  I understand that I have the right to withhold or withdraw my consent to the use of telemedicine in the course of my care at any time, without affecting my right to future care or treatment, and that the Practitioner or I may terminate the telemedicine visit at any time. I understand that I have the right to inspect all information obtained and/or recorded in the course of the telemedicine visit and may receive copies of available information for a reasonable fee.  I understand that some of the potential risks of receiving the Services via telemedicine include:  Delay or interruption in medical evaluation due to technological equipment failure or  disruption; Information transmitted may not be sufficient (e.g. poor resolution of images) to allow for appropriate medical decision making by the Practitioner; and/or  In rare instances, security protocols could fail, causing a breach of personal health information.  Furthermore, I acknowledge that it is my responsibility to provide information about my medical history, conditions and care that is complete and accurate to the best of my ability. I acknowledge that Practitioner's advice, recommendations, and/or decision may be based on factors not within their control, such as incomplete or inaccurate data provided by me or distortions of diagnostic images or specimens that may result from electronic transmissions. I understand that the practice of medicine is not an exact science and that Practitioner makes no warranties or guarantees regarding treatment outcomes. I acknowledge that a copy of this consent can be made available to me via my patient portal [facility] MyChart), or I can request a printed copy by calling the office of [facility].    I understand that my insurance will be billed for this visit.   I have read or had this consent read to me. I understand the contents of this consent, which adequately explains the benefits and risks of the Services being provided via telemedicine.  I have been provided ample opportunity to ask questions regarding this consent and the Services and have had my questions answered to my satisfaction. I give my informed consent for the services to be provided through the use of telemedicine in my medical care

## 2023-01-29 NOTE — Telephone Encounter (Signed)
Left message to call back to schedule a tele pre op appt.  ?

## 2023-01-29 NOTE — Telephone Encounter (Signed)
Pt has been scheduled for tele pre op appt 02/01/23 @ 3:20, add on due to med hold and date of procedure.    Med rec and consent are done.

## 2023-01-29 NOTE — Telephone Encounter (Signed)
Patient with diagnosis of afib on Eliquis for anticoagulation.    Procedure: transurethral resection of bladder tumor (TURBT) and gemcitabine Date of procedure: 02/05/2023   CHA2DS2-VASc Score = 5   This indicates a 7.2% annual risk of stroke. The patient's score is based upon: CHF History: 1 HTN History: 1 Diabetes History: 0 Stroke History: 0 Vascular Disease History: 0 Age Score: 2 Gender Score: 1    CrCl 69 mL/min Platelet count 292 K  DCCV - 01/01/2023 4 weeks post procedure uninterrupted anticoagulation ends 01/29/2023  Elimination half life of ~12 hrs, 5 days hold would be too long. patient can hold Eliquis for 3 days prior to procedure.   .  **This guidance is not considered finalized until pre-operative APP has relayed final recommendations.**

## 2023-01-30 ENCOUNTER — Encounter (HOSPITAL_BASED_OUTPATIENT_CLINIC_OR_DEPARTMENT_OTHER): Payer: Self-pay | Admitting: Urology

## 2023-01-30 ENCOUNTER — Ambulatory Visit (INDEPENDENT_AMBULATORY_CARE_PROVIDER_SITE_OTHER): Payer: Medicare HMO | Admitting: *Deleted

## 2023-01-30 VITALS — Ht 59.0 in | Wt 183.6 lb

## 2023-01-30 DIAGNOSIS — Z0000 Encounter for general adult medical examination without abnormal findings: Secondary | ICD-10-CM | POA: Diagnosis not present

## 2023-01-30 NOTE — Progress Notes (Signed)
Spoke w/ via phone for pre-op interview--- pt Lab needs dos---- [employer] results------ current EKG in epic/ chart COVID test -----patient states asymptomatic no test needed Arrive at ------- 0900 on 02-05-2023 NPO after MN NO Solid Food.  Clear liquids from MN until--- 0800 Med rec completed Medications to take morning of surgery ----- metoprolol, prilosec Diabetic medication ----- n/a Patient instructed no nail polish to be worn day of surgery Patient instructed to bring photo id and insurance card day of surgery Patient aware to have Driver (ride ) / caregiver    for 24 hours after surgery -- daughter , caroline Patient Special Instructions ----- n/a Pre-Op special Instructions ----- pt has cardiac tele visit appt for 02-01-2023 @ 1520 for medical and eliquis clearance in epic Patient verbalized understanding of instructions that were given at this phone interview. Patient denies shortness of breath, chest pain, fever, cough at this phone interview.   Anesthesia Review:  HTN;  PAF s/p DCCV 12-10-23-2024;  chronic diastolic CHF;  hyperparathyroidism/ hypercalcemia  PCP:  Dr Patsy Lager Cardiologist : Dr Jens Som [facility] 12-11-2022) Chest x-ray : 11-20-2022 EKG : 01-01-2023 after DCCV Echo :  10-21-2022 Stress test: no Cardiac Cath : no Activity level: denies sob w/ normal activity Sleep Study/ CPAP : no  Blood Thinner/ Instructions /Last Dose: eliquis ASA / Instructions/ Last Dose : no Pt will be getting recommendation/ instructions from cardiology at tele visit appt on 02-01-2023

## 2023-01-30 NOTE — Progress Notes (Signed)
Subjective:   Loretta Gates is a 87 y.o. female who presents for Medicare Annual (Subsequent) preventive examination.  I connected with  [name] on 01/30/23 by a audio enabled telemedicine application and verified that I am speaking with the correct person using two identifiers.  Patient Location: Home  Provider Location: Office/Clinic  I discussed the limitations of evaluation and management by telemedicine. The patient expressed understanding and agreed to proceed.   Review of Systems     Cardiac Risk Factors include: advanced age (>13men, >28 women);obesity (BMI >30kg/m2);hypertension     Objective:    Today's Vitals   01/30/23 1020  Weight: 183 lb 9.6 oz (83.3 kg)  Height: 4\' 11"  (1.499 m)   Body mass index is 37.08 kg/m.     01/30/2023   10:21 AM 01/01/2023    9:40 AM 10/21/2022   10:00 AM 10/20/2022    9:39 AM 10/10/2022    2:03 PM 06/26/2022    1:20 PM 06/09/2022    2:13 PM  Advanced Directives  Does Patient Have a Medical Advance Directive? Yes No Yes  Yes Yes Yes  Type of Estate agent of [place];Living will  Living will;Healthcare Power of Attorney Living will;Healthcare Power of Attorney Living will Healthcare Power of [place];Living will Healthcare Power of [place];Living will  Does patient want to make changes to medical advance directive?   No - Patient declined   No - Patient declined No - Patient declined  Copy of Healthcare Power of Attorney in Chart? No - copy requested  No - copy requested  No - copy requested No - copy requested No - copy requested  Would patient like information on creating a medical advance directive?      No - Patient declined No - Patient declined    Current Medications (verified) Outpatient Encounter Medications as of 01/30/2023  Medication Sig   apixaban (ELIQUIS) 5 MG TABS tablet TAKE ONE TABLET BY MOUTH TWICE A DAY   cholestyramine (QUESTRAN) 4 GM/DOSE powder Take 1 packet (4 g total) by mouth See  admin instructions. Mix 1 teaspoon in water and take at 6pm daily with warfarin. (Patient taking differently: Take 4 g by mouth See admin instructions. Mix 1 teaspoon in water and take at 6pm daily)   denosumab (PROLIA) 60 MG/ML SOSY injection Inject 60 mg into the skin every 6 (six) months.   empagliflozin (JARDIANCE) 10 MG TABS tablet TAKE ONE TABLET BY MOUTH ONE TIME DAILY   ergocalciferol (VITAMIN D2) 1.25 MG (50000 UT) capsule Take 50,000 Units by mouth every 14 (fourteen) days. Every other saturday   furosemide (LASIX) 20 MG tablet Alternate taking 20 mg po daily with taking 30 mg po daily ever other day   irbesartan (AVAPRO) 300 MG tablet Take 1 tablet (300 mg total) by mouth daily.   Magnesium Oxide -Mg Supplement 500 MG TABS Take 500 mg by mouth daily.   metoprolol tartrate (LOPRESSOR) 50 MG tablet Take 1.5 tablets (75 mg total) by mouth 2 (two) times daily.   NONFORMULARY OR COMPOUNDED ITEM Compression socks  20-30 mmhg  Dx low ext edema   omeprazole (PRILOSEC) 40 MG capsule Take 1 capsule (40 mg total) by mouth 2 (two) times daily before lunch and supper. (Patient taking differently: Take 40 mg by mouth in the morning and at bedtime.)   oxybutynin (DITROPAN XL) 15 MG 24 hr tablet Take 1 tablet (15 mg total) by mouth at bedtime.   No facility-administered encounter medications  on file as of 01/30/2023.    Allergies (verified) Bee venom, Levaquin [levofloxacin], and Peanut-containing drug products   History: Past Medical History:  Diagnosis Date   Anemia    Atrial fibrillation    on Coumadin   Breast mass, right    Cancer    LEFT BREAST-Mastectomy   CHF (congestive heart failure)    GERD (gastroesophageal reflux disease)    Hypertension    OAB (overactive bladder)    PONV (postoperative nausea and vomiting)    Past Surgical History:  Procedure Laterality Date   BREAST LUMPECTOMY WITH RADIOACTIVE SEED LOCALIZATION Right 10/25/2021   Procedure: RIGHT BREAST LUMPECTOMY WITH  RADIOACTIVE SEED LOCALIZATION;  Surgeon: Harriette Bouillon, MD;  Location: [facility];  Service: General;  Laterality: Right;   CARDIOVERSION N/A 01/01/2023   Procedure: CARDIOVERSION;  Surgeon: [name], MD;  Location: [facility] ENDOSCOPY;  Service: Cardiovascular;  Laterality: N/A;   CHOLECYSTECTOMY     MASTECTOMY Left 2010   OVARIAN CYST REMOVAL     PARATHYROIDECTOMY     Family History  Problem Relation Age of Onset   Sudden death Neg Hx    Social History   Socioeconomic History   Marital status: Married    Spouse name: Not on file   Number of children: Not on file   Years of education: Not on file   Highest education level: Not on file  Occupational History   Not on file  Tobacco Use   Smoking status: Never   Smokeless tobacco: Never  Vaping Use   Vaping Use: Never used  Substance and Sexual Activity   Alcohol use: Never   Drug use: Never   Sexual activity: Not Currently    Birth control/protection: Post-menopausal  Other Topics Concern   Not on file  Social History Narrative   Not on file   Social Determinants of Health   Financial Resource Strain: Low Risk  (01/27/2022)   Overall Financial Resource Strain (CARDIA)    Difficulty of Paying Living Expenses: Not hard at all  Food Insecurity: No Food Insecurity (10/21/2022)   Hunger Vital Sign    Worried About Running Out of Food in the Last Year: Never true    Ran Out of Food in the Last Year: Never true  Transportation Needs: No Transportation Needs (10/21/2022)   PRAPARE - Administrator, Civil Service (Medical): No    Lack of Transportation (Non-Medical): No  Physical Activity: Insufficiently Active (01/27/2022)   Exercise Vital Sign    Days of Exercise per Week: 3 days    Minutes of Exercise per Session: 30 min  Stress: No Stress Concern Present (01/27/2022)   [employer] of Occupational Health - Occupational Stress Questionnaire    Feeling of Stress : Not at all  Social  Connections: Socially Integrated (01/27/2022)   Social Connection and Isolation Panel [NHANES]    Frequency of Communication with Friends and Family: More than three times a week    Frequency of Social Gatherings with Friends and Family: More than three times a week    Attends Religious Services: More than 4 times per year    Active Member of [employer] or Organizations: Yes    Attends Engineer, structural: More than 4 times per year    Marital Status: Married    Tobacco Counseling Counseling given: Not Answered   Clinical Intake:  Pre-visit preparation completed: Yes  Pain : No/denies pain  BMI - recorded: 37.08 Nutritional Status: BMI >  30  Obese Diabetes: No  How often do you need to have someone help you when you read instructions, pamphlets, or other written materials from your doctor or pharmacy?: 1 - Never  Activities of Daily Living    01/30/2023   10:24 AM 10/21/2022   10:00 AM  In your present state of health, do you have any difficulty performing the following activities:  Hearing? 1 0  Comment some very slight hearing loss   Vision? 0 0  Difficulty concentrating or making decisions? 0 0  Walking or climbing stairs? 0 0  Dressing or bathing? 0 0  Doing errands, shopping? 0 0  Preparing Food and eating ? N   Using the Toilet? N   In the past six months, have you accidently leaked urine? Y   Do you have problems with loss of bowel control? N   Managing your Medications? N   Managing your Finances? N   Housekeeping or managing your Housekeeping? N     Patient Care Team: Copland, Gwenlyn Found, MD as PCP - General (Family Medicine) Jens Som Madolyn Frieze, MD as PCP - Cardiology (Cardiology)  Indicate any recent Medical Services you may have received from other than [facility] providers in the past year (date may be approximate).     Assessment:   This is a routine wellness examination for [place].  Hearing/Vision screen No results found.  Dietary issues and  exercise activities discussed: Current Exercise Habits: Home exercise routine, Type of exercise: walking, Time (Minutes): 20, Frequency (Times/Week): 3, Weekly Exercise (Minutes/Week): 60, Intensity: Mild, Exercise limited by: None identified   Goals Addressed   None    Depression Screen    01/30/2023   10:23 AM 11/20/2022    2:33 PM 11/10/2022    9:05 AM 01/27/2022   10:57 AM 11/14/2021   11:14 AM  PHQ 2/9 Scores  PHQ - 2 Score 0 0 0 0 0  Exception Documentation     Medical reason    Fall Risk    01/30/2023   10:22 AM 11/20/2022    2:33 PM 11/10/2022    9:06 AM 01/27/2022   11:04 AM  Fall Risk   Falls in the past year? 1 0 0 0  Number falls in past yr: 0 0 0 0  Injury with Fall? 0 0 0 0  Risk for fall due to : Impaired balance/gait   No Fall Risks  Follow up Falls evaluation completed Falls evaluation completed Falls evaluation completed Falls prevention discussed    FALL RISK PREVENTION PERTAINING TO THE HOME:  Any stairs in or around the home? Yes  If so, are there any without handrails? No  Home free of loose throw rugs in walkways, pet beds, electrical cords, etc? Yes  Adequate lighting in your home to reduce risk of falls? Yes   ASSISTIVE DEVICES UTILIZED TO PREVENT FALLS:  Life alert? No  Use of a cane, walker or w/c? Yes  Grab bars in the bathroom? Yes  Shower chair or bench in shower? Yes  Elevated toilet seat or a handicapped toilet? No   TIMED UP AND GO:  Was the test performed?  No, audio visit .   Cognitive Function:        01/30/2023   10:31 AM 01/27/2022   11:39 AM 01/27/2022   11:08 AM  6CIT Screen  What Year? 0 points 0 points 0 points  What month? 0 points 0 points 0 points  What time? 0 points 0 points 0  points  Count back from 20 0 points 0 points 0 points  Months in reverse 0 points 0 points 0 points  Repeat phrase 2 points 0 points 0 points  Total Score 2 points 0 points 0 points    Immunizations Immunization History  Administered  Date(s) Administered   Fluad Quad(high Dose 65+) 06/28/2019, 06/14/2022   Influenza-Unspecified 07/28/2021   Moderna Sars-Covid-2 Vaccination 03/30/2021, 08/02/2021   PFIZER Comirnaty(Gray Top)Covid-19 Tri-Sucrose Vaccine 07/07/2020   PFIZER(Purple Top)SARS-COV-2 Vaccination 10/24/2019, 11/14/2019   Pneumococcal Conjugate-13 09/23/2014   Pneumococcal Polysaccharide-23 09/30/2007   Td 07/09/2002, 01/11/2022   Td (Adult),5 Lf Tetanus Toxid, Preservative Free 07/09/2002   Zoster, Live 03/07/2011    TDAP status: Up to date  Flu Vaccine status: Up to date  Pneumococcal vaccine status: Up to date  Covid-19 vaccine status: Information provided on how to obtain vaccines.   Qualifies for Shingles Vaccine? Yes   Zostavax completed Yes   Shingrix Completed?: No.    Education has been provided regarding the importance of this vaccine. Patient has been advised to call insurance company to determine out of pocket expense if they have not yet received this vaccine. Advised may also receive vaccine at local pharmacy or Health Dept. Verbalized acceptance and understanding.  Screening Tests Health Maintenance  Topic Date Due   Zoster Vaccines- Shingrix (1 of 2) Never done   COVID-19 Vaccine (6 - 2023-24 season) 06/09/2022   Medicare Annual Wellness (AWV)  01/28/2023   INFLUENZA VACCINE  05/10/2023   DTaP/Tdap/Td (4 - Tdap) 01/12/2032   Pneumonia Vaccine 7+ Years old  Completed   DEXA SCAN  Completed   HPV VACCINES  Aged Out    Health Maintenance  Health Maintenance Due  Topic Date Due   Zoster Vaccines- Shingrix (1 of 2) Never done   COVID-19 Vaccine (6 - 2023-24 season) 06/09/2022   Medicare Annual Wellness (AWV)  01/28/2023    Colorectal cancer screening: No longer required.   Mammogram status: Ordered 11/13/22. Pt provided with contact info and advised to call to schedule appt.   Bone Density status: Completed 08/04/21. Results reflect: Bone density results: OSTEOPENIA. Repeat  every 2 years.  Lung Cancer Screening: (Low Dose CT Chest recommended if Age 42-80 years, 30 pack-year currently smoking OR have quit w/in 15years.) does not qualify.   Additional Screening:  Hepatitis C Screening: does not qualify  Vision Screening: Recommended annual ophthalmology exams for early detection of glaucoma and other disorders of the eye. Is the patient up to date with their annual eye exam?  Yes  Who is the provider or what is the name of the office in which the patient attends annual eye exams? In the process of switching to new eye doctor If pt is not established with a provider, would they like to be referred to a provider to establish care? No .   Dental Screening: Recommended annual dental exams for proper oral hygiene  Community Resource Referral / Chronic Care Management: CRR required this visit?  No   CCM required this visit?  No      Plan:     I have personally reviewed and noted the following in the patient's chart:   Medical and social history Use of alcohol, tobacco or illicit drugs  Current medications and supplements including opioid prescriptions. Patient is not currently taking opioid prescriptions. Functional ability and status Nutritional status Physical activity Advanced directives List of other physicians Hospitalizations, surgeries, and ER visits in previous 12 months Vitals Screenings to  include cognitive, depression, and falls Referrals and appointments  In addition, I have reviewed and discussed with patient certain preventive protocols, quality metrics, and best practice recommendations. A written personalized care plan for preventive services as well as general preventive health recommendations were provided to patient.   Due to this being a telephonic visit, the after visit summary with patients personalized plan was offered to patient via mail or my-chart. Patient would like to access on my-chart.  Donne Anon, [place]   01/30/2023    Nurse Notes: None

## 2023-01-30 NOTE — Patient Instructions (Signed)
Loretta Gates , Thank you for taking time to come for your Medicare Wellness Visit. I appreciate your ongoing commitment to your health goals. Please review the following plan we discussed and let me know if I can assist you in the future.     This is a list of the screening recommended for you and due dates:  Health Maintenance  Topic Date Due   Zoster (Shingles) Vaccine (1 of 2) Never done   COVID-19 Vaccine (6 - 2023-24 season) 06/09/2022   Flu Shot  05/10/2023   Medicare Annual Wellness Visit  01/30/2024   DTaP/Tdap/Td vaccine (4 - Tdap) 01/12/2032   Pneumonia Vaccine  Completed   DEXA scan (bone density measurement)  Completed   HPV Vaccine  Aged Out    Next appointment: Follow up in one year for your annual wellness visit.   Preventive Care 85 Years and Older, Female Preventive care refers to lifestyle choices and visits with your health care provider that can promote health and wellness. What does preventive care include? A yearly physical exam. This is also called an annual well check. Dental exams once or twice a year. Routine eye exams. Ask your health care provider how often you should have your eyes checked. Personal lifestyle choices, including: Daily care of your teeth and gums. Regular physical activity. Eating a healthy diet. Avoiding tobacco and drug use. Limiting alcohol use. Practicing safe sex. Taking low-dose aspirin every day. Taking vitamin and mineral supplements as recommended by your health care provider. What happens during an annual well check? The services and screenings done by your health care provider during your annual well check will depend on your age, overall health, lifestyle risk factors, and family history of disease. Counseling  Your health care provider may ask you questions about your: Alcohol use. Tobacco use. Drug use. Emotional well-being. Home and relationship well-being. Sexual activity. Eating habits. History of  falls. Memory and ability to understand (cognition). Work and work Astronomer. Reproductive health. Screening  You may have the following tests or measurements: Height, weight, and BMI. Blood pressure. Lipid and cholesterol levels. These may be checked every 5 years, or more frequently if you are over 85 years old. Skin check. Lung cancer screening. You may have this screening every year starting at age 85 if you have a 30-pack-year history of smoking and currently smoke or have quit within the past 15 years. Fecal occult blood test (FOBT) of the stool. You may have this test every year starting at age 85. Flexible sigmoidoscopy or colonoscopy. You may have a sigmoidoscopy every 5 years or a colonoscopy every 10 years starting at age 85. Hepatitis C blood test. Hepatitis B blood test. Sexually transmitted disease (STD) testing. Diabetes screening. This is done by checking your blood sugar (glucose) after you have not eaten for a while (fasting). You may have this done every 1-3 years. Bone density scan. This is done to screen for osteoporosis. You may have this done starting at age 85. Mammogram. This may be done every 1-2 years. Talk to your health care provider about how often you should have regular mammograms. Talk with your health care provider about your test results, treatment options, and if necessary, the need for more tests. Vaccines  Your health care provider may recommend certain vaccines, such as: Influenza vaccine. This is recommended every year. Tetanus, diphtheria, and acellular pertussis (Tdap, Td) vaccine. You may need a Td booster every 10 years. Zoster vaccine. You may need this after age 85.  Pneumococcal 13-valent conjugate (PCV13) vaccine. One dose is recommended after age 85. Pneumococcal polysaccharide (PPSV23) vaccine. One dose is recommended after age 85. Talk to your health care provider about which screenings and vaccines you need and how often you need  them. This information is not intended to replace advice given to you by your health care provider. Make sure you discuss any questions you have with your health care provider. Document Released: 10/22/2015 Document Revised: 06/14/2016 Document Reviewed: 07/27/2015 Elsevier Interactive Patient Education  2017 [employer].  Fall Prevention in the Home Falls can cause injuries. They can happen to people of all ages. There are many things you can do to make your home safe and to help prevent falls. What can I do on the outside of my home? Regularly fix the edges of walkways and driveways and fix any cracks. Remove anything that might make you trip as you walk through a door, such as a raised step or threshold. Trim any bushes or trees on the path to your home. Use bright outdoor lighting. Clear any walking paths of anything that might make someone trip, such as rocks or tools. Regularly check to see if handrails are loose or broken. Make sure that both sides of any steps have handrails. Any raised decks and porches should have guardrails on the edges. Have any leaves, snow, or ice cleared regularly. Use sand or salt on walking paths during winter. Clean up any spills in your garage right away. This includes oil or grease spills. What can I do in the bathroom? Use night lights. Install grab bars by the toilet and in the tub and shower. Do not use towel bars as grab bars. Use non-skid mats or decals in the tub or shower. If you need to sit down in the shower, use a plastic, non-slip stool. Keep the floor dry. Clean up any water that spills on the floor as soon as it happens. Remove soap buildup in the tub or shower regularly. Attach bath mats securely with double-sided non-slip rug tape. Do not have throw rugs and other things on the floor that can make you trip. What can I do in the bedroom? Use night lights. Make sure that you have a light by your bed that is easy to reach. Do not use  any sheets or blankets that are too big for your bed. They should not hang down onto the floor. Have a firm chair that has side arms. You can use this for support while you get dressed. Do not have throw rugs and other things on the floor that can make you trip. What can I do in the kitchen? Clean up any spills right away. Avoid walking on wet floors. Keep items that you use a lot in easy-to-reach places. If you need to reach something above you, use a strong step stool that has a grab bar. Keep electrical cords out of the way. Do not use floor polish or wax that makes floors slippery. If you must use wax, use non-skid floor wax. Do not have throw rugs and other things on the floor that can make you trip. What can I do with my stairs? Do not leave any items on the stairs. Make sure that there are handrails on both sides of the stairs and use them. Fix handrails that are broken or loose. Make sure that handrails are as long as the stairways. Check any carpeting to make sure that it is firmly attached to the stairs. Fix any  carpet that is loose or worn. Avoid having throw rugs at the top or bottom of the stairs. If you do have throw rugs, attach them to the floor with carpet tape. Make sure that you have a light switch at the top of the stairs and the bottom of the stairs. If you do not have them, ask someone to add them for you. What else can I do to help prevent falls? Wear shoes that: Do not have high heels. Have rubber bottoms. Are comfortable and fit you well. Are closed at the toe. Do not wear sandals. If you use a stepladder: Make sure that it is fully opened. Do not climb a closed stepladder. Make sure that both sides of the stepladder are locked into place. Ask someone to hold it for you, if possible. Clearly mark and make sure that you can see: Any grab bars or handrails. First and last steps. Where the edge of each step is. Use tools that help you move around (mobility aids)  if they are needed. These include: Canes. Walkers. Scooters. Crutches. Turn on the lights when you go into a dark area. Replace any light bulbs as soon as they burn out. Set up your furniture so you have a clear path. Avoid moving your furniture around. If any of your floors are uneven, fix them. If there are any pets around you, be aware of where they are. Review your medicines with your doctor. Some medicines can make you feel dizzy. This can increase your chance of falling. Ask your doctor what other things that you can do to help prevent falls. This information is not intended to replace advice given to you by your health care provider. Make sure you discuss any questions you have with your health care provider. Document Released: 07/22/2009 Document Revised: 03/02/2016 Document Reviewed: 10/30/2014 Elsevier Interactive Patient Education  2017 [employer].

## 2023-02-01 ENCOUNTER — Ambulatory Visit: Payer: Medicare HMO | Attending: Cardiology | Admitting: Nurse Practitioner

## 2023-02-01 DIAGNOSIS — Z01810 Encounter for preprocedural cardiovascular examination: Secondary | ICD-10-CM

## 2023-02-01 NOTE — Telephone Encounter (Signed)
Left message to call back to reschedule her tele pre op appt. Pt missed her tele appt today.

## 2023-02-01 NOTE — Progress Notes (Signed)
   Virtual Visit via Telephone Note   Because of FREDI HURTADO co-morbid illnesses, she is at least at moderate risk for complications without adequate follow up.  This format is felt to be most appropriate for this patient at this time.  The patient did not have access to video technology/had technical difficulties with video requiring transitioning to audio format only (telephone).  All issues noted in this document were discussed and addressed.  No physical exam could be performed with this format.  Please refer to the patient's chart for her consent to telehealth for [facility].  Evaluation Performed:  Preoperative cardiovascular risk assessment _____________   Date:  02/01/2023   Patient ID:  Loretta Gates, DOB [birth year], MRN [id number] Patient Location:  Home Provider location:   Office  Primary Care Provider:  Pearline Cables, MD Primary Cardiologist:  Olga Millers, MD  Chief Complaint / Patient Profile   87 y.o. y/o female with a h/o persistent atrial fibrillation, chronic diastolic heart failure, hypertension, chronic venous insufficiency, bladder cancer, hyperparathyroidism, IDA, and GERD who is pending TURBT and Gematabine on 02/05/2023 with Dr. Estrellita Ludwig of [facility] and presents today for telephonic preoperative cardiovascular risk assessment.  Attempted x 3 to contact patient for scheduled preop telephone visit.  No answer.  Left message for pt call back at earliest convenience.  I will notify our preop coverage team to reschedule patient's virtual visit.  Joylene Grapes, NP  02/01/2023, 3:20 PM

## 2023-02-02 ENCOUNTER — Telehealth: Payer: Self-pay | Admitting: *Deleted

## 2023-02-02 ENCOUNTER — Ambulatory Visit: Payer: Medicare HMO | Attending: Cardiology | Admitting: Nurse Practitioner

## 2023-02-02 DIAGNOSIS — E213 Hyperparathyroidism, unspecified: Secondary | ICD-10-CM | POA: Diagnosis not present

## 2023-02-02 DIAGNOSIS — Z01810 Encounter for preprocedural cardiovascular examination: Secondary | ICD-10-CM | POA: Diagnosis not present

## 2023-02-02 NOTE — Progress Notes (Addendum)
Cardiac clearance note/note to stop eliquis x 3 days emily monge np dated 02-02-2023 on chart for 02-05-2023 surgery.

## 2023-02-02 NOTE — Telephone Encounter (Signed)
  Patient Consent for Virtual Visit         Loretta Gates has provided verbal consent on 02/02/2023 for a virtual visit (video or telephone).   CONSENT FOR VIRTUAL VISIT FOR:  Loretta Gates  By participating in this virtual visit I agree to the following:  I hereby voluntarily request, consent and authorize [facility] and its employed or contracted physicians, physician assistants, nurse practitioners or other licensed health care professionals (the Practitioner), to provide me with telemedicine health care services (the "Services") as deemed necessary by the treating Practitioner. I acknowledge and consent to receive the Services by the Practitioner via telemedicine. I understand that the telemedicine visit will involve communicating with the Practitioner through live audiovisual communication technology and the disclosure of certain medical information by electronic transmission. I acknowledge that I have been given the opportunity to request an in-person assessment or other available alternative prior to the telemedicine visit and am voluntarily participating in the telemedicine visit.  I understand that I have the right to withhold or withdraw my consent to the use of telemedicine in the course of my care at any time, without affecting my right to future care or treatment, and that the Practitioner or I may terminate the telemedicine visit at any time. I understand that I have the right to inspect all information obtained and/or recorded in the course of the telemedicine visit and may receive copies of available information for a reasonable fee.  I understand that some of the potential risks of receiving the Services via telemedicine include:  Delay or interruption in medical evaluation due to technological equipment failure or disruption; Information transmitted may not be sufficient (e.g. poor resolution of images) to allow for appropriate medical decision making by the  Practitioner; and/or  In rare instances, security protocols could fail, causing a breach of personal health information.  Furthermore, I acknowledge that it is my responsibility to provide information about my medical history, conditions and care that is complete and accurate to the best of my ability. I acknowledge that Practitioner's advice, recommendations, and/or decision may be based on factors not within their control, such as incomplete or inaccurate data provided by me or distortions of diagnostic images or specimens that may result from electronic transmissions. I understand that the practice of medicine is not an exact science and that Practitioner makes no warranties or guarantees regarding treatment outcomes. I acknowledge that a copy of this consent can be made available to me via my patient portal [facility] MyChart), or I can request a printed copy by calling the office of [facility].    I understand that my insurance will be billed for this visit.   I have read or had this consent read to me. I understand the contents of this consent, which adequately explains the benefits and risks of the Services being provided via telemedicine.  I have been provided ample opportunity to ask questions regarding this consent and the Services and have had my questions answered to my satisfaction. I give my informed consent for the services to be provided through the use of telemedicine in my medical care

## 2023-02-02 NOTE — H&P (Signed)
CC/HPI: cc: spotting   10/24/22: 87 year old woman comes in with intermittent spotting on her pad over the last year. It most recently happened in December 2023. She does not think this is coming from her vagina. She does still have a uterus. She has a history of breast cancer and underwent a left mastectomy followed by chemotherapy about 10 years ago. She is unsure if it was ER PR positive. She also has a history of anemia and has been undergoing iron infusions. She recently had fluid overload and has been diuresed. During this time she was admitted to the hospital from 10/20/2022 to 10/23/2022 with diagnosis of decompensated heart failure. She has been experiencing nocturnal enuresis as well. She did see red urine in the toilet bowl at 1 point. She does not get UTIs. Patient is here today with her daughter.   01/26/2023: 87 year old woman with intermittent spotting here for cystoscopy as part of hematuria workup. CT scan previously done showed a bladder mass concerning for malignancy. I had called her prior to today's appointment to let her know.     ALLERGIES: bee venom Levaquin Panuts    MEDICATIONS: Metoprolol Succinate 25 mg tablet, extended release 24 hr  Omeprazole 40 mg capsule,delayed release  Amlodipine Besylate 5 mg tablet  Cholestyramine 4 gram powder  Eliquis  Ergocalciferol  Furosemide 20 mg tablet  Hydrocodone-Acetaminophen 5 mg-325 mg tablet  Irbesartan 300 mg tablet  Jardiance  Oxybutynin Chloride Er 15 mg tablet, extended release 24 hr  Prolia 60 mg/ml syringe     GU PSH: Locm 300-399Mg /Ml Iodine,1Ml - 01/22/2023     NON-GU PSH: Parathyroidectomy Remove Gallbladder     GU PMH: Gross hematuria - 01/22/2023, - 10/24/2022 Nocturnal Enuresis - 10/24/2022    NON-GU PMH: Arrhythmia Arthritis Atrial Fibrillation Breast Cancer, History GERD Hypertension    FAMILY HISTORY: No Family History    SOCIAL HISTORY: Marital Status: Married Preferred Language: English;  Ethnicity: Not Hispanic Or Latino; Race: White Current Smoking Status: Patient does not smoke anymore.   Tobacco Use Assessment Completed: Used Tobacco in last 30 days? Has never drank.  Does not drink caffeine.    REVIEW OF SYSTEMS:    GU Review Female:   Patient denies frequent urination, hard to postpone urination, burning /pain with urination, get up at night to urinate, leakage of urine, stream starts and stops, trouble starting your stream, have to strain to urinate, and being pregnant.  Gastrointestinal (Upper):   Patient denies nausea, vomiting, and indigestion/ heartburn.  Gastrointestinal (Lower):   Patient denies diarrhea and constipation.  Constitutional:   Patient denies fever, night sweats, weight loss, and fatigue.  Skin:   Patient denies skin rash/ lesion and itching.  Eyes:   Patient denies blurred vision and double vision.  Ears/ Nose/ Throat:   Patient denies sore throat and sinus problems.  Hematologic/Lymphatic:   Patient denies swollen glands and easy bruising.  Cardiovascular:   Patient denies chest pains and leg swelling.  Respiratory:   Patient denies cough and shortness of breath.  Endocrine:   Patient denies excessive thirst.  Musculoskeletal:   Patient denies back pain and joint pain.  Neurological:   Patient denies headaches and dizziness.  Psychologic:   Patient denies depression and anxiety.   VITAL SIGNS:      01/26/2023 09:35 AM  Weight 182 lb / 82.55 kg  Height 50 in / 127 cm  BP 196/98 mmHg  Heart Rate 68 /min  Temperature 98.4 F / 36.8 C  BMI  51.2 kg/m   GU PHYSICAL EXAMINATION:    External Genitalia: No hirsutism, no rash, no scarring, no cyst, no erythematous lesion, no papular lesion, no blanched lesion, no warty lesion. No edema.  Urethral Meatus: Normal size. Normal position. No discharge.  Vagina: Moderate vaginal atrophy.    MULTI-SYSTEM PHYSICAL EXAMINATION:    Constitutional: Well-nourished. No physical deformities. Normally  developed. Good grooming.  Neck: Neck symmetrical, not swollen. Normal tracheal position.  Respiratory: No labored breathing, no use of accessory muscles.   Skin: No paleness, no jaundice, no cyanosis. No lesion, no ulcer, no rash.  Neurologic / Psychiatric: Oriented to time, oriented to place, oriented to person. No depression, no anxiety, no agitation.  Eyes: Normal conjunctivae. Normal eyelids.  Ears, Nose, Mouth, and Throat: Left ear no scars, no lesions, no masses. Right ear no scars, no lesions, no masses. Nose no scars, no lesions, no masses. Normal hearing. Normal lips.  Musculoskeletal: Normal gait and station of head and neck.     Complexity of Data:  Records Review:   Previous Patient Records, POC Tool  Urine Test Review:   Urinalysis  X-Ray Review: C.T. Abdomen/Pelvis: Reviewed Films. Reviewed Report. Discussed With Patient. IMPRESSION:  1. Contrast enhancing endoluminal masses of the posterior right  aspect of the urinary bladder, components measuring 1.5 x 1.2 cm  inferiorly, and 1.0 x 0.9 cm superiorly. Findings are consistent  with primary bladder malignancy.  2. No evidence of lymphadenopathy or metastatic disease in the  abdomen or pelvis.  3. Descending and sigmoid diverticulosis without evidence of acute  diverticulitis.  4. Coronary artery disease.   These results will be called to the ordering clinician or  representative by the Radiologist Assistant, and communication  documented in the PACS or [employer].   Aortic Atherosclerosis (ICD10-I70.0).    Electronically Signed  By: Jearld Lesch M.D.  On: 01/23/2023 22:26     PROCEDURES:         Flexible Cystoscopy - 52000  Risks, benefits, and some of the potential complications of the procedure were discussed at length with the patient including infection, bleeding, voiding discomfort, urinary retention, fever, chills, sepsis, and others. All questions were answered. Informed consent was obtained.  Antibiotic prophylaxis was given. Sterile technique and intraurethral analgesia were used.  Meatus:  Normal size. Normal location. Normal condition.  Urethra:  Normal  Ureteral Orifices:  Normal location. Normal size. Normal shape. Effluxed clear urine.  Bladder:  3 cm papillary bladder mass - approx 3 different tumors on right suprior posterior wall concerning for malignancy      The lower urinary tract was carefully examined. The procedure was well-tolerated and without complications. Antibiotic instructions were given. Instructions were given to call the office immediately for bloody urine, difficulty urinating, urinary retention, painful or frequent urination, fever, chills, nausea, vomiting or other illness. The patient stated that she understood these instructions and would comply with them.         Urinalysis w/Scope Dipstick Dipstick Cont'd Micro  Color: Yellow Bilirubin: Neg mg/dL WBC/hpf: NS (Not Seen)  Appearance: Clear Ketones: Neg mg/dL RBC/hpf: 0 - 2/hpf  Specific Gravity: <=1.005 Blood: 1+ ery/uL Bacteria: NS (Not Seen)  pH: 7.5 Protein: Neg mg/dL Cystals: NS (Not Seen)  Glucose: 3+ mg/dL Urobilinogen: 0.2 mg/dL Casts: NS (Not Seen)    Nitrites: Neg Trichomonas: Not Present    Leukocyte Esterase: Neg leu/uL Mucous: Not Present      Epithelial Cells: 6 - 10/hpf  Yeast: NS (Not Seen)      Sperm: Not Present    ASSESSMENT:      ICD-10 Details  1 GU:   Gross hematuria - R31.0 Undiagnosed New Problem  2   Bladder tumor/neoplasm - D41.4 Undiagnosed New Problem   PLAN:           Document Letter(s):  Created for Patient: Clinical Summary         Notes:   Bladder tumor:  -Patient was given printed copy of radiology report and had previously discussed findings of bladder tumor  -Cystoscopy confirmed papillary bladder mass on right posterior superior bladder wall 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   Schedule next available surgery. Will reach out to cardiology for risk assessment.

## 2023-02-02 NOTE — Progress Notes (Signed)
Virtual Visit via Telephone Note   Because of Loretta Gates co-morbid illnesses, she is at least at moderate risk for complications without adequate follow up.  This format is felt to be most appropriate for this patient at this time.  The patient did not have access to video technology/had technical difficulties with video requiring transitioning to audio format only (telephone).  All issues noted in this document were discussed and addressed.  No physical exam could be performed with this format.  Please refer to the patient's chart for her consent to telehealth for [facility].  Evaluation Performed:  Preoperative cardiovascular risk assessment _____________   Date:  02/02/2023   Patient ID:  Loretta Gates, DOB [birth year], MRN [id number] Patient Location:  Home Provider location:   Office  Primary Care Provider:  Pearline Cables, MD Primary Cardiologist:  Olga Millers, MD  Chief Complaint / Patient Profile   87 y.o. y/o female with a h/o persistent atrial fibrillation, chronic diastolic heart failure, hypertension, chronic venous insufficiency, bladder cancer, hyperparathyroidism, IDA, and GERD who is pending TURBT and Gematabine on 02/05/2023 with Dr. Estrellita Ludwig of [facility] and presents today for telephonic preoperative cardiovascular risk assessment.  History of Present Illness    Loretta Gates is a 87 y.o. female who presents via audio/video conferencing for a telehealth visit today.  Pt was last seen in cardiology clinic on 12/11/2022 by Dr. Jens Som.  At that time Loretta Gates was doing well.  The patient is now pending procedure as outlined above. Since her last visit, she has done well from a cardiac standpoint.   She denies chest pain, palpitations, dyspnea, pnd, orthopnea, n, v, dizziness, syncope, edema, weight gain, or early satiety. All other systems reviewed and are otherwise negative except as noted above.   Past Medical History     Past Medical History:  Diagnosis Date   Anemia    Bladder cancer [facility])    urologist--- dr pace   Chronic diastolic (congestive) heart failure (HCC)    followed by dr Jens Som   Chronic venous insufficiency    Edema of both lower extremities    per pt wears compression hose   GERD (gastroesophageal reflux disease)    History of cancer chemotherapy    completed 2008 for left breast cancer   History of head and neck radiation 1946   per pt approx 1946 or 1947  (age 22) due to profound bilateral hearing loss told from scarlett fever/ measles,  once weekly for several weeks had  head/ neck radiation,  hearing was restored without neededing hearing aids   History of left breast cancer 2008   malignant neoplasm of overlapping sites of left breast, ER+;  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 patel;   2006  s/p left thyroidectomy w/ right inferior parathyoidectomy  (per path speciman marked parathyroid but only thyroid tissue)   Hypertension    IDA (iron deficiency anemia)    hematology/ oncologist--- dr ennever/ sarah carter NP;  treated w/ IV iron infusions   Multinodular thyroid    Neuropathy    mild hands/ feet, uses cane   Nocturia more than twice per night    Nocturnal leg cramps    OA (osteoarthritis)    knees   OAB (overactive bladder)    Osteoporosis    PAF (paroxysmal  atrial fibrillation) [facility])    cardiologist--- dr Jens Som   PONV (postoperative nausea and vomiting)    Vitamin D deficiency    Wears glasses    Past Surgical History:  Procedure Laterality Date   BREAST LUMPECTOMY WITH RADIOACTIVE SEED LOCALIZATION Right 10/25/2021   Procedure: RIGHT BREAST LUMPECTOMY WITH RADIOACTIVE SEED LOCALIZATION;  Surgeon: Harriette Bouillon, MD;  Location: [facility];  Service: General;  Laterality: Right;   CARDIOVERSION N/A 01/01/2023    Procedure: CARDIOVERSION;  Surgeon: [name], MD;  Location: [facility];  Service: Cardiovascular;  Laterality: N/A;   CHOLECYSTECTOMY, LAPAROSCOPIC  02/25/2017   @[facility]    COLONOSCOPY WITH ESOPHAGOGASTRODUODENOSCOPY (EGD)  2022   MASTECTOMY WITH AXILLARY LYMPH NODE DISSECTION Left 03/05/2007   @ [facility]   OVARIAN CYST SURGERY     age 41;   abdominal   THYROIDECTOMY, PARTIAL Left 2006   left lobectomy and right infertior parathyroidectomy   TONSILLECTOMY     child    Allergies  Allergies  Allergen Reactions   Bee Venom Swelling    Bee stung her on the inside of her gum. Lips and gums started swelling. No SOB.   Levaquin [Levofloxacin] Hives and Itching   Peanut-Containing Drug Products Other (See Comments)    Stomach pain and diarrhea     Home Medications    Prior to Admission medications   Medication Sig Start Date End Date Taking? Authorizing Provider  apixaban (ELIQUIS) 5 MG TABS tablet TAKE ONE TABLET BY MOUTH TWICE A DAY 12/14/22   Copland, Gwenlyn Found, MD  cholestyramine (QUESTRAN) 4 GM/DOSE powder Take 1 packet (4 g total) by mouth See admin instructions. Mix 1 teaspoon in water and take at 6pm daily with warfarin. Patient taking differently: Take 4 g by mouth See admin instructions. Mix 1 teaspoon in water and take at 6pm daily 11/06/22   Copland, Gwenlyn Found, MD  cinacalcet (SENSIPAR) 30 MG tablet Take 30 mg by mouth daily with breakfast.    [provider]  denosumab (PROLIA) 60 MG/ML SOSY injection Inject 60 mg into the skin every 6 (six) months.    [provider]  empagliflozin (JARDIANCE) 10 MG TABS tablet TAKE ONE TABLET BY MOUTH ONE TIME DAILY Patient taking differently: Take 10 mg by mouth daily. 12/14/22   Copland, Gwenlyn Found, MD  ergocalciferol (VITAMIN D2) 1.25 MG (50000 UT) capsule Take 50,000 Units by mouth every 14 (fourteen) days. Every other saturday    [provider]  furosemide (LASIX) 20 MG tablet Alternate taking 20 mg po  daily with taking 30 mg po daily ever other day Patient taking differently: Take 20 mg by mouth as directed. Alternate taking 20 mg po daily with taking 30 mg po daily ever other day 01/10/23   Copland, Gwenlyn Found, MD  irbesartan (AVAPRO) 300 MG tablet Take 1 tablet (300 mg total) by mouth daily. Patient taking differently: Take 300 mg by mouth daily. 05/29/22   Copland, Gwenlyn Found, MD  Magnesium Oxide -Mg Supplement 500 MG TABS Take 500 mg by mouth daily.    [provider]  metoprolol tartrate (LOPRESSOR) 50 MG tablet Take 1.5 tablets (75 mg total) by mouth 2 (two) times daily. 11/20/22   Copland, Gwenlyn Found, MD  omeprazole (PRILOSEC) 40 MG capsule Take 1 capsule (40 mg total) by mouth 2 (two) times daily before lunch and supper. Patient taking differently: Take 40 mg by mouth in the morning and at bedtime. 12/25/22   Copland, Gwenlyn Found, MD  oxybutynin (DITROPAN XL) 15 MG 24 hr tablet Take 1 tablet (15 mg total) by mouth at bedtime. Patient taking differently: Take 15 mg by mouth at bedtime. 05/05/22   Copland, Gwenlyn Found, MD    Physical Exam    Vital Signs:  Loretta Gates does not have vital signs available for review today.  Given telephonic nature of communication, physical exam is limited. AAOx3. NAD. Normal affect.  Speech and respirations are unlabored.  Accessory Clinical Findings    None  Assessment & Plan    1.  Preoperative Cardiovascular Risk Assessment:  According to the Revised Cardiac Risk Index (RCRI), her Perioperative Risk of Major Cardiac Event is (%): 0.9. Her Functional Capacity in METs is: 5.72 according to the Duke Activity Status Index (DASI). Therefore, based on ACC/AHA guidelines, patient would be at acceptable risk for the planned procedure without further cardiovascular testing.   The patient was advised that if she develops new symptoms prior to surgery to contact our office to arrange for a follow-up visit, and she verbalized understanding.  Per office  protocol, patient can hold Eliquis for 3 days prior to procedure.  Please resume Eliquis as soon as possible postprocedure, at the discretion of the surgeon.   A copy of this note will be routed to requesting surgeon.  Time:   Today, I have spent 10 minutes with the patient with telehealth technology discussing medical history, symptoms, and management plan.     Joylene Grapes, NP  02/02/2023, 11:35 AM

## 2023-02-02 NOTE — Telephone Encounter (Signed)
Patient returned called returned call please call she can be reached at [phone], if you can't reach her at that number call [phone].

## 2023-02-05 ENCOUNTER — Ambulatory Visit (HOSPITAL_BASED_OUTPATIENT_CLINIC_OR_DEPARTMENT_OTHER)
Admission: RE | Admit: 2023-02-05 | Discharge: 2023-02-05 | Disposition: A | Discharge status: Home / Self Care | Payer: Medicare HMO | Source: Home / Self Care | Attending: Urology | Admitting: Urology

## 2023-02-05 ENCOUNTER — Ambulatory Visit (HOSPITAL_BASED_OUTPATIENT_CLINIC_OR_DEPARTMENT_OTHER): Payer: Medicare HMO | Admitting: Certified Registered"

## 2023-02-05 ENCOUNTER — Encounter (HOSPITAL_BASED_OUTPATIENT_CLINIC_OR_DEPARTMENT_OTHER)
Admission: RE | Disposition: A | Discharge status: Home / Self Care | Payer: Self-pay | Source: Home / Self Care | Attending: Urology

## 2023-02-05 ENCOUNTER — Encounter (HOSPITAL_BASED_OUTPATIENT_CLINIC_OR_DEPARTMENT_OTHER): Payer: Self-pay | Admitting: Urology

## 2023-02-05 ENCOUNTER — Other Ambulatory Visit: Payer: Self-pay

## 2023-02-05 DIAGNOSIS — I509 Heart failure, unspecified: Secondary | ICD-10-CM

## 2023-02-05 DIAGNOSIS — Z9012 Acquired absence of left breast and nipple: Secondary | ICD-10-CM | POA: Insufficient documentation

## 2023-02-05 DIAGNOSIS — C679 Malignant neoplasm of bladder, unspecified: Secondary | ICD-10-CM

## 2023-02-05 DIAGNOSIS — Z7901 Long term (current) use of anticoagulants: Secondary | ICD-10-CM | POA: Insufficient documentation

## 2023-02-05 DIAGNOSIS — R310 Gross hematuria: Secondary | ICD-10-CM | POA: Insufficient documentation

## 2023-02-05 DIAGNOSIS — Z87891 Personal history of nicotine dependence: Secondary | ICD-10-CM | POA: Insufficient documentation

## 2023-02-05 DIAGNOSIS — K219 Gastro-esophageal reflux disease without esophagitis: Secondary | ICD-10-CM | POA: Insufficient documentation

## 2023-02-05 DIAGNOSIS — I4891 Unspecified atrial fibrillation: Secondary | ICD-10-CM

## 2023-02-05 DIAGNOSIS — D630 Anemia in neoplastic disease: Secondary | ICD-10-CM | POA: Diagnosis not present

## 2023-02-05 DIAGNOSIS — Z853 Personal history of malignant neoplasm of breast: Secondary | ICD-10-CM | POA: Insufficient documentation

## 2023-02-05 DIAGNOSIS — Z01818 Encounter for other preprocedural examination: Secondary | ICD-10-CM

## 2023-02-05 DIAGNOSIS — C672 Malignant neoplasm of lateral wall of bladder: Secondary | ICD-10-CM | POA: Diagnosis not present

## 2023-02-05 DIAGNOSIS — D494 Neoplasm of unspecified behavior of bladder: Secondary | ICD-10-CM | POA: Diagnosis not present

## 2023-02-05 DIAGNOSIS — H539 Unspecified visual disturbance: Secondary | ICD-10-CM | POA: Diagnosis not present

## 2023-02-05 DIAGNOSIS — I110 Hypertensive heart disease with heart failure: Secondary | ICD-10-CM

## 2023-02-05 DIAGNOSIS — I63232 Cerebral infarction due to unspecified occlusion or stenosis of left carotid arteries: Secondary | ICD-10-CM | POA: Diagnosis not present

## 2023-02-05 HISTORY — DX: Iron deficiency anemia, unspecified: D50.9

## 2023-02-05 HISTORY — DX: Age-related osteoporosis without current pathological fracture: M81.0

## 2023-02-05 HISTORY — DX: Hypercalcemia: E83.52

## 2023-02-05 HISTORY — DX: Localized edema: R60.0

## 2023-02-05 HISTORY — DX: Presence of spectacles and contact lenses: Z97.3

## 2023-02-05 HISTORY — DX: Venous insufficiency (chronic) (peripheral): I87.2

## 2023-02-05 HISTORY — DX: Unspecified osteoarthritis, unspecified site: M19.90

## 2023-02-05 HISTORY — DX: Hyperparathyroidism, unspecified: E21.3

## 2023-02-05 HISTORY — DX: Polyneuropathy, unspecified: G62.9

## 2023-02-05 HISTORY — DX: Malignant neoplasm of bladder, unspecified: C67.9

## 2023-02-05 HISTORY — DX: Nontoxic multinodular goiter: E04.2

## 2023-02-05 HISTORY — DX: Nocturia: R35.1

## 2023-02-05 HISTORY — DX: Paroxysmal atrial fibrillation: I48.0

## 2023-02-05 HISTORY — DX: Personal history of antineoplastic chemotherapy: Z92.21

## 2023-02-05 HISTORY — DX: Sleep related leg cramps: G47.62

## 2023-02-05 HISTORY — DX: Vitamin D deficiency, unspecified: E55.9

## 2023-02-05 HISTORY — DX: Chronic diastolic (congestive) heart failure: I50.32

## 2023-02-05 LAB — POCT I-STAT, CHEM 8
BUN: 25 mg/dL — ABNORMAL HIGH (ref 8–23)
Calcium, Ion: 1.24 mmol/L (ref 1.15–1.40)
Chloride: 102 mmol/L (ref 98–111)
Creatinine, Ser: 0.8 mg/dL (ref 0.44–1.00)
Glucose, Bld: 111 mg/dL — ABNORMAL HIGH (ref 70–99)
HCT: 48 % — ABNORMAL HIGH (ref 36.0–46.0)
Hemoglobin: 16.3 g/dL — ABNORMAL HIGH (ref 12.0–15.0)
Potassium: 4.3 mmol/L (ref 3.5–5.1)
Sodium: 142 mmol/L (ref 135–145)
TCO2: 28 mmol/L (ref 22–32)

## 2023-02-05 SURGERY — TURBT, WITH CHEMOTHERAPEUTIC AGENT INSTILLATION INTO BLADDER
Anesthesia: General | Site: Bladder

## 2023-02-05 MED ORDER — ROCURONIUM BROMIDE 10 MG/ML (PF) SYRINGE
PREFILLED_SYRINGE | INTRAVENOUS | Status: AC
  Filled 2023-02-05: qty 10

## 2023-02-05 MED ORDER — TRAMADOL HCL 50 MG PO TABS: 50.0000 mg | ORAL_TABLET | Freq: Four times a day (QID) | ORAL | 0 refills | Status: DC | PRN

## 2023-02-05 MED ORDER — FENTANYL CITRATE (PF) 100 MCG/2ML IJ SOLN
25.0000 ug | INTRAMUSCULAR | Status: DC | PRN
  Administered 2023-02-05 (×3): 25 ug via INTRAVENOUS

## 2023-02-05 MED ORDER — LIDOCAINE 2% (20 MG/ML) 5 ML SYRINGE
INTRAMUSCULAR | Status: DC | PRN
  Administered 2023-02-05: 60 mg via INTRAVENOUS

## 2023-02-05 MED ORDER — FENTANYL CITRATE (PF) 100 MCG/2ML IJ SOLN
INTRAMUSCULAR | Status: AC
  Filled 2023-02-05: qty 2

## 2023-02-05 MED ORDER — SODIUM CHLORIDE 0.9 % IR SOLN
Status: DC | PRN
  Administered 2023-02-05: 6000 mL via INTRAVESICAL
  Administered 2023-02-05: 3000 mL via INTRAVESICAL

## 2023-02-05 MED ORDER — SUGAMMADEX SODIUM 200 MG/2ML IV SOLN
INTRAVENOUS | Status: DC | PRN
  Administered 2023-02-05: 200 mg via INTRAVENOUS

## 2023-02-05 MED ORDER — PROPOFOL 10 MG/ML IV BOLUS
INTRAVENOUS | Status: DC | PRN
  Administered 2023-02-05: 150 mg via INTRAVENOUS

## 2023-02-05 MED ORDER — ONDANSETRON HCL 4 MG/2ML IJ SOLN
INTRAMUSCULAR | Status: DC | PRN
  Administered 2023-02-05: 4 mg via INTRAVENOUS

## 2023-02-05 MED ORDER — LACTATED RINGERS IV SOLN: INTRAVENOUS | Status: DC

## 2023-02-05 MED ORDER — FENTANYL CITRATE (PF) 100 MCG/2ML IJ SOLN
INTRAMUSCULAR | Status: DC | PRN
  Administered 2023-02-05 (×2): 50 ug via INTRAVENOUS

## 2023-02-05 MED ORDER — DEXAMETHASONE SODIUM PHOSPHATE 10 MG/ML IJ SOLN
INTRAMUSCULAR | Status: AC
  Filled 2023-02-05: qty 1

## 2023-02-05 MED ORDER — GEMCITABINE CHEMO FOR BLADDER INSTILLATION 2000 MG
2000.0000 mg | Freq: Once | INTRAVENOUS | Status: AC
  Administered 2023-02-05: 2000 mg via INTRAVESICAL
  Filled 2023-02-05: qty 2000

## 2023-02-05 MED ORDER — CEFAZOLIN SODIUM-DEXTROSE 2-4 GM/100ML-% IV SOLN
INTRAVENOUS | Status: AC
  Filled 2023-02-05: qty 100

## 2023-02-05 MED ORDER — CEFAZOLIN SODIUM-DEXTROSE 2-4 GM/100ML-% IV SOLN
2.0000 g | INTRAVENOUS | Status: AC
  Administered 2023-02-05: 2 g via INTRAVENOUS

## 2023-02-05 MED ORDER — DEXAMETHASONE SODIUM PHOSPHATE 10 MG/ML IJ SOLN
INTRAMUSCULAR | Status: DC | PRN
  Administered 2023-02-05: 5 mg via INTRAVENOUS

## 2023-02-05 MED ORDER — GEMCITABINE CHEMO FOR BLADDER INSTILLATION 2000 MG: 2000.0000 mg | Freq: Once | INTRAVENOUS | Status: DC

## 2023-02-05 MED ORDER — AMISULPRIDE (ANTIEMETIC) 5 MG/2ML IV SOLN: 10.0000 mg | Freq: Once | INTRAVENOUS | Status: DC | PRN

## 2023-02-05 MED ORDER — PROPOFOL 10 MG/ML IV BOLUS
INTRAVENOUS | Status: AC
  Filled 2023-02-05: qty 20

## 2023-02-05 MED ORDER — ROCURONIUM BROMIDE 10 MG/ML (PF) SYRINGE
PREFILLED_SYRINGE | INTRAVENOUS | Status: DC | PRN
  Administered 2023-02-05: 50 mg via INTRAVENOUS

## 2023-02-05 MED ORDER — PROPOFOL 500 MG/50ML IV EMUL
INTRAVENOUS | Status: DC | PRN
  Administered 2023-02-05: 150 ug/kg/min via INTRAVENOUS

## 2023-02-05 MED ORDER — LIDOCAINE HCL (PF) 2 % IJ SOLN
INTRAMUSCULAR | Status: AC
  Filled 2023-02-05: qty 5

## 2023-02-05 MED ORDER — ACETAMINOPHEN 10 MG/ML IV SOLN: 1000.0000 mg | Freq: Once | INTRAVENOUS | Status: DC | PRN

## 2023-02-05 MED ORDER — ONDANSETRON HCL 4 MG/2ML IJ SOLN
INTRAMUSCULAR | Status: AC
  Filled 2023-02-05: qty 2

## 2023-02-05 SURGICAL SUPPLY — 22 items
BAG DRAIN URO-CYSTO SKYTR STRL (DRAIN) ×1 IMPLANT
BAG DRN RND TRDRP ANRFLXCHMBR (UROLOGICAL SUPPLIES) ×1
BAG DRN UROCATH (DRAIN) ×1
BAG URINE DRAIN 2000ML AR STRL (UROLOGICAL SUPPLIES) IMPLANT
CATH FOLEY 2WAY SLVR  5CC 18FR (CATHETERS) ×1
CATH FOLEY 2WAY SLVR 5CC 18FR (CATHETERS) IMPLANT
CLOTH BEACON ORANGE TIMEOUT ST (SAFETY) ×1 IMPLANT
ELECT REM PT RETURN 9FT ADLT (ELECTROSURGICAL) ×1
ELECTRODE REM PT RTRN 9FT ADLT (ELECTROSURGICAL) ×1 IMPLANT
GLOVE BIO SURGEON STRL SZ 6.5 (GLOVE) ×1 IMPLANT
GOWN STRL REUS W/TWL LRG LVL3 (GOWN DISPOSABLE) ×1 IMPLANT
HOLDER FOLEY CATH W/STRAP (MISCELLANEOUS) IMPLANT
IV NS IRRIG 3000ML ARTHROMATIC (IV SOLUTION) ×1 IMPLANT
KIT TURNOVER CYSTO (KITS) ×1 IMPLANT
LOOP CUT BIPOLAR 24F LRG (ELECTROSURGICAL) IMPLANT
MANIFOLD NEPTUNE II (INSTRUMENTS) ×1 IMPLANT
PACK CYSTO (CUSTOM PROCEDURE TRAY) ×1 IMPLANT
SLEEVE SCD COMPRESS KNEE MED (STOCKING) ×1 IMPLANT
SYR TOOMEY IRRIG 70ML (MISCELLANEOUS) ×1
SYRINGE TOOMEY IRRIG 70ML (MISCELLANEOUS) ×1 IMPLANT
TUBE CONNECTING 12X1/4 (SUCTIONS) ×1 IMPLANT
TUBING UROLOGY SET (TUBING) ×1 IMPLANT

## 2023-02-05 NOTE — Discharge Instructions (Addendum)
Transurethral Resection of Bladder Tumor (TURBT)   Definition:  Transurethral Resection of the Bladder Tumor is a surgical procedure used to diagnose and remove tumors within the bladder. TURBT is the most common treatment for early stage bladder cancer.  General instructions:     Your recent bladder surgery requires very little post hospital care but some definite precautions.  Despite the fact that no skin incisions were used, the area around the bladder incisions are raw and covered with scabs to promote healing and prevent bleeding. Certain precautions are needed to insure that the scabs are not disturbed over the next 2-4 weeks while the healing proceeds.  Because the raw surface inside your bladder and the irritating effects of urine you may expect frequency of urination and/or urgency (a stronger desire to urinate) and perhaps even getting up at night more often. This will usually resolve or improve slowly over the healing period. You may see some blood in your urine over the first 6 weeks. Do not be alarmed, even if the urine was clear for a while. Get off your feet and drink lots of fluids until clearing occurs. If you start to pass clots or don't improve call us.  Catheter: (If you are discharged with a catheter.)  1. Keep your catheter secured to your leg at all times with tape or the supplied strap. 2. You may experience leakage of urine around your catheter- as long as the  catheter continues to drain, this is normal.  If your catheter stops draining  go to the ER. 3. You may also have blood in your urine, even after it has been clear for  several days; you may even pass some small blood clots or other material.  This  is normal as well.  If this happens, sit down and drink plenty of water to help  make urine to flush out your bladder.  If the blood in your urine becomes worse  after doing this, contact our office or return to the ER. 4. You may use the leg bag (small bag)  during the day, but use the large bag at  night.  Diet:  You may return to your normal diet immediately. Because of the raw surface of your bladder, alcohol, spicy foods, foods high in acid and drinks with caffeine may cause irritation or frequency and should be used in moderation. To keep your urine flowing freely and avoid constipation, drink plenty of fluids during the day (8-10 glasses). Tip: Avoid cranberry juice because it is very acidic.  Activity:  Your physical activity doesn't need to be restricted. However, if you are very active, you may see some blood in the urine. We suggest that you reduce your activity under the circumstances until the bleeding has stopped.  Bowels:  It is important to keep your bowels regular during the postoperative period. Straining with bowel movements can cause bleeding. A bowel movement every other day is reasonable. Use a mild laxative if needed, such as milk of magnesia 2-3 tablespoons, or 2 Dulcolax tablets. Call if you continue to have problems. If you had been taking narcotics for pain, before, during or after your surgery, you may be constipated. Take a laxative if necessary.    Medication:  You should resume your pre-surgery medications unless told not to. In addition you may be given an antibiotic to prevent or treat infection. Antibiotics are not always necessary. All medication should be taken as prescribed until the bottles are finished unless you are having   an unusual reaction to one of the drugs.   Do NOT resume eliquis until urine has been clear without blood or clots for 24 hours.

## 2023-02-05 NOTE — Anesthesia Procedure Notes (Signed)
Procedure Name: Intubation Date/Time: 02/05/2023 10:44 AM  Performed by: Francie Massing, CRNAPre-anesthesia Checklist: Patient identified, Emergency Drugs available, Suction available and Patient being monitored Patient Re-evaluated:Patient Re-evaluated prior to induction Oxygen Delivery Method: Circle system utilized Preoxygenation: Pre-oxygenation with 100% oxygen Induction Type: IV induction Ventilation: Mask ventilation without difficulty Laryngoscope Size: Mac and 3 Grade View: Grade I Tube type: Oral Tube size: 7.5 mm Number of attempts: 1 Airway Equipment and Method: Stylet and Oral airway Placement Confirmation: ETT inserted through vocal cords under direct vision, positive ETCO2 and breath sounds checked- equal and bilateral Secured at: 22 cm Tube secured with: Tape Dental Injury: Teeth and Oropharynx as per pre-operative assessment

## 2023-02-05 NOTE — Interval H&P Note (Signed)
History and Physical Interval Note:  02/05/2023 10:26 AM  Loretta Gates  has presented today for surgery, with the diagnosis of BLADDER CANCER.  The various methods of treatment have been discussed with the patient and family. After consideration of risks, benefits and other options for treatment, the patient has consented to  Procedure(s) with comments: TRANSURETHRAL RESECTION OF BLADDER TUMOR (TURBT) with GEMCITABINE (N/A) - 45 MINUTES as a surgical intervention.  The patient's history has been reviewed, patient examined, no change in status, stable for surgery.  I have reviewed the patient's chart and labs.  Questions were answered to the patient's satisfaction.     Jara Feider D Lucile Didonato

## 2023-02-05 NOTE — Anesthesia Preprocedure Evaluation (Addendum)
Anesthesia Evaluation  Patient identified by MRN, date of birth, ID band Patient awake    Reviewed: Allergy & Precautions, NPO status , Patient's Chart, lab work & pertinent test results  History of Anesthesia Complications (+) PONV and history of anesthetic complications  Airway Mallampati: II  TM Distance: >3 FB Neck ROM: Full    Dental no notable dental hx.    Pulmonary neg pulmonary ROS   Pulmonary exam normal        Cardiovascular hypertension, Pt. on medications and Pt. on home beta blockers +CHF  + dysrhythmias Atrial Fibrillation  Rhythm:Regular Rate:Normal     Neuro/Psych negative neurological ROS  negative psych ROS   GI/Hepatic Neg liver ROS,GERD  Medicated,,  Endo/Other  negative endocrine ROS    Renal/GU negative Renal ROS Bladder dysfunction      Musculoskeletal  (+) Arthritis , Osteoarthritis,    Abdominal Normal abdominal exam  (+)   Peds  Hematology  (+) Blood dyscrasia, anemia   Anesthesia Other Findings   Reproductive/Obstetrics                             Anesthesia Physical Anesthesia Plan  ASA: 3  Anesthesia Plan: General   Post-op Pain Management:    Induction: Intravenous  PONV Risk Score and Plan: 4 or greater and Ondansetron, Dexamethasone, Treatment may vary due to age or medical condition, TIVA and Amisulpride  Airway Management Planned: Mask and LMA  Additional Equipment: None  Intra-op Plan:   Post-operative Plan: Extubation in OR  Informed Consent: I have reviewed the patients History and Physical, chart, labs and discussed the procedure including the risks, benefits and alternatives for the proposed anesthesia with the patient or authorized representative who has indicated his/her understanding and acceptance.     Dental advisory given  Plan Discussed with: CRNA  Anesthesia Plan Comments:        Anesthesia Quick Evaluation

## 2023-02-05 NOTE — Anesthesia Postprocedure Evaluation (Signed)
Anesthesia Post Note  Patient: SIMRA FIEBIG  Procedure(s) Performed: TRANSURETHRAL RESECTION OF BLADDER TUMOR (TURBT) with post operative GEMCITABINE (Bladder)     Patient location during evaluation: PACU Anesthesia Type: General Level of consciousness: awake and alert Pain management: pain level controlled Vital Signs Assessment: post-procedure vital signs reviewed and stable Respiratory status: spontaneous breathing, nonlabored ventilation, respiratory function stable and patient connected to nasal cannula oxygen Cardiovascular status: blood pressure returned to baseline and stable Postop Assessment: no apparent nausea or vomiting Anesthetic complications: no   No notable events documented.  Last Vitals:  Vitals:   02/05/23 1245 02/05/23 1300  BP: (!) 142/73 (!) 162/83  Pulse: 74 71  Resp: 14 12  Temp:    SpO2: 97% 91%    Last Pain:  Vitals:   02/05/23 1245  TempSrc:   PainSc: 4                  Jovonda Selner P Kyran Whittier

## 2023-02-05 NOTE — Op Note (Signed)
PATIENT:  Loretta Gates  PRE-OPERATIVE DIAGNOSIS: Bladder tumor  POST-OPERATIVE DIAGNOSIS: Same  PROCEDURE:  Procedure(s): 1. TRANSURETHRAL RESECTION OF BLADDER TUMOR (TURBT) (3cm.) 2. Instillation of intravesical chemotherapy (Gemcitabine)  SURGEON:  Kasandra Knudsen, MD  ANESTHESIA:   General  EBL:  less than 50 mL  DRAINS: Urethral catheter (18 Fr. Foley)   SPECIMEN:  Bladder tumor  DISPOSITION OF SPECIMEN:  PATHOLOGY  Findings: Moderate cystocele Normal urethra Bilateral orthotopic Uos with bilateral clear efflux 3 cm papillary bladder mass with surrounding lesions on right lateral wall  Indication:  87 yo woman who presented with intermittent gross hematuria found to have multifocal papillary bladder mass.   Description of operation: The patient was taken to the operating room and administered general anesthesia. They were then placed on the table and moved to the dorsal lithotomy position after which the genitalia was sterilely prepped and draped. An official timeout was then performed.  The 95 French resectoscope with the 30 lens and visual obturator were then passed into the bladder under direct visualization. Urethra appeared normal. The visual obturator was then removed and the Gyrus resectoscope element with 30  lens was then inserted and the bladder was fully and systematically inspected. Ureteral orifices were noted to be in the normal anatomic positions.   I first began by resecting the papillary bladder mass seen on the right anterior bladder wall.  There were several smaller superficial appearing bladder masses surrounding the largest mass.  The ureteral orifices were seen and uninvolved.    Reinspection of the bladder revealed all obvious tumor had been fully resected and there was no evidence of perforation. The Microvasive evacuator was then used to irrigate the bladder and remove all of the portions of bladder tumor which were sent to pathology. I then  removed the resectoscope.  An 18 French Foley catheter was then inserted in the bladder and irrigated. The irrigant returned slightly pink with no clots. The patient was awakened and taken to the recovery room.  While in the recovery room 2 g of gemcitabine in 52.6 cc of sterile water was instilled in the bladder through the catheter and the catheter was plugged. This will remain indwelling for approximately one hour. It will then be drained from the bladder and the catheter will be removed and the patient discharged home.  PLAN OF CARE: Discharge to home after PACU  PATIENT DISPOSITION:  PACU - hemodynamically stable.

## 2023-02-05 NOTE — Transfer of Care (Signed)
Immediate Anesthesia Transfer of Care Note  Patient: KIMBLERY DIOP  Procedure(s) Performed: Procedure(s) (LRB): TRANSURETHRAL RESECTION OF BLADDER TUMOR (TURBT) with post operative GEMCITABINE (N/A)  Patient Location: PACU  Anesthesia Type: General  Level of Consciousness: awake, oriented, sedated and patient cooperative  Airway & Oxygen Therapy: Patient Spontanous Breathing and Patient connected to face mask oxygen  Post-op Assessment: Report given to PACU RN and Post -op Vital signs reviewed and stable  Post vital signs: Reviewed and stable  Complications: No apparent anesthesia complications Last Vitals:  Vitals Value Taken Time  BP    Temp    Pulse 63 02/05/23 1127  Resp 15 02/05/23 1127  SpO2 99 % 02/05/23 1127  Vitals shown include unvalidated device data.  Last Pain:  Vitals:   02/05/23 0930  TempSrc: Oral  PainSc: 0-No pain      Patients Stated Pain Goal: 5 (02/05/23 0930)  Complications: No notable events documented.

## 2023-02-06 ENCOUNTER — Telehealth: Payer: Self-pay | Admitting: Family Medicine

## 2023-02-06 ENCOUNTER — Emergency Department (HOSPITAL_COMMUNITY): Payer: Medicare HMO | Admitting: Certified Registered Nurse Anesthetist

## 2023-02-06 ENCOUNTER — Inpatient Hospital Stay (HOSPITAL_BASED_OUTPATIENT_CLINIC_OR_DEPARTMENT_OTHER)
Admission: EM | Admit: 2023-02-06 | Discharge: 2023-02-13 | DRG: 023 | Disposition: A | Discharge status: Rehabilitation Facility | Payer: Medicare HMO | Attending: Neurology | Admitting: Neurology

## 2023-02-06 ENCOUNTER — Emergency Department (HOSPITAL_BASED_OUTPATIENT_CLINIC_OR_DEPARTMENT_OTHER): Payer: Medicare HMO

## 2023-02-06 ENCOUNTER — Encounter (HOSPITAL_COMMUNITY)
Admission: EM | Disposition: A | Discharge status: Other Acute Inpatient Hospital | Payer: Self-pay | Source: Home / Self Care | Attending: Neurology

## 2023-02-06 ENCOUNTER — Other Ambulatory Visit: Payer: Self-pay

## 2023-02-06 ENCOUNTER — Encounter (HOSPITAL_BASED_OUTPATIENT_CLINIC_OR_DEPARTMENT_OTHER): Payer: Self-pay | Admitting: Pediatrics

## 2023-02-06 ENCOUNTER — Emergency Department (HOSPITAL_COMMUNITY): Payer: Medicare HMO

## 2023-02-06 DIAGNOSIS — R58 Hemorrhage, not elsewhere classified: Secondary | ICD-10-CM | POA: Diagnosis not present

## 2023-02-06 DIAGNOSIS — G453 Amaurosis fugax: Secondary | ICD-10-CM | POA: Diagnosis not present

## 2023-02-06 DIAGNOSIS — I4891 Unspecified atrial fibrillation: Secondary | ICD-10-CM | POA: Diagnosis not present

## 2023-02-06 DIAGNOSIS — H53122 Transient visual loss, left eye: Secondary | ICD-10-CM | POA: Diagnosis not present

## 2023-02-06 DIAGNOSIS — B372 Candidiasis of skin and nail: Secondary | ICD-10-CM | POA: Diagnosis not present

## 2023-02-06 DIAGNOSIS — I110 Hypertensive heart disease with heart failure: Secondary | ICD-10-CM | POA: Diagnosis not present

## 2023-02-06 DIAGNOSIS — H539 Unspecified visual disturbance: Secondary | ICD-10-CM | POA: Diagnosis not present

## 2023-02-06 DIAGNOSIS — F419 Anxiety disorder, unspecified: Secondary | ICD-10-CM | POA: Diagnosis not present

## 2023-02-06 DIAGNOSIS — Z853 Personal history of malignant neoplasm of breast: Secondary | ICD-10-CM | POA: Diagnosis not present

## 2023-02-06 DIAGNOSIS — Z9221 Personal history of antineoplastic chemotherapy: Secondary | ICD-10-CM | POA: Diagnosis not present

## 2023-02-06 DIAGNOSIS — I63231 Cerebral infarction due to unspecified occlusion or stenosis of right carotid arteries: Secondary | ICD-10-CM | POA: Diagnosis not present

## 2023-02-06 DIAGNOSIS — Z7409 Other reduced mobility: Secondary | ICD-10-CM | POA: Diagnosis not present

## 2023-02-06 DIAGNOSIS — D649 Anemia, unspecified: Secondary | ICD-10-CM

## 2023-02-06 DIAGNOSIS — I609 Nontraumatic subarachnoid hemorrhage, unspecified: Secondary | ICD-10-CM | POA: Diagnosis not present

## 2023-02-06 DIAGNOSIS — I6522 Occlusion and stenosis of left carotid artery: Secondary | ICD-10-CM | POA: Diagnosis present

## 2023-02-06 DIAGNOSIS — R29810 Facial weakness: Secondary | ICD-10-CM | POA: Diagnosis not present

## 2023-02-06 DIAGNOSIS — R7303 Prediabetes: Secondary | ICD-10-CM | POA: Diagnosis not present

## 2023-02-06 DIAGNOSIS — E890 Postprocedural hypothyroidism: Secondary | ICD-10-CM | POA: Diagnosis not present

## 2023-02-06 DIAGNOSIS — H918X3 Other specified hearing loss, bilateral: Secondary | ICD-10-CM | POA: Diagnosis present

## 2023-02-06 DIAGNOSIS — I63512 Cerebral infarction due to unspecified occlusion or stenosis of left middle cerebral artery: Secondary | ICD-10-CM | POA: Diagnosis not present

## 2023-02-06 DIAGNOSIS — Z79899 Other long term (current) drug therapy: Secondary | ICD-10-CM

## 2023-02-06 DIAGNOSIS — Z87891 Personal history of nicotine dependence: Secondary | ICD-10-CM

## 2023-02-06 DIAGNOSIS — I63232 Cerebral infarction due to unspecified occlusion or stenosis of left carotid arteries: Secondary | ICD-10-CM | POA: Diagnosis not present

## 2023-02-06 DIAGNOSIS — I482 Chronic atrial fibrillation, unspecified: Secondary | ICD-10-CM | POA: Diagnosis not present

## 2023-02-06 DIAGNOSIS — Z7984 Long term (current) use of oral hypoglycemic drugs: Secondary | ICD-10-CM | POA: Diagnosis not present

## 2023-02-06 DIAGNOSIS — K219 Gastro-esophageal reflux disease without esophagitis: Secondary | ICD-10-CM | POA: Diagnosis not present

## 2023-02-06 DIAGNOSIS — C679 Malignant neoplasm of bladder, unspecified: Secondary | ICD-10-CM | POA: Diagnosis present

## 2023-02-06 DIAGNOSIS — Z7983 Long term (current) use of bisphosphonates: Secondary | ICD-10-CM

## 2023-02-06 DIAGNOSIS — Z9012 Acquired absence of left breast and nipple: Secondary | ICD-10-CM | POA: Diagnosis not present

## 2023-02-06 DIAGNOSIS — Z9049 Acquired absence of other specified parts of digestive tract: Secondary | ICD-10-CM

## 2023-02-06 DIAGNOSIS — I63412 Cerebral infarction due to embolism of left middle cerebral artery: Secondary | ICD-10-CM | POA: Diagnosis not present

## 2023-02-06 DIAGNOSIS — G936 Cerebral edema: Secondary | ICD-10-CM | POA: Diagnosis not present

## 2023-02-06 DIAGNOSIS — M810 Age-related osteoporosis without current pathological fracture: Secondary | ICD-10-CM | POA: Diagnosis present

## 2023-02-06 DIAGNOSIS — R414 Neurologic neglect syndrome: Secondary | ICD-10-CM | POA: Diagnosis not present

## 2023-02-06 DIAGNOSIS — M1990 Unspecified osteoarthritis, unspecified site: Secondary | ICD-10-CM | POA: Diagnosis present

## 2023-02-06 DIAGNOSIS — R4589 Other symptoms and signs involving emotional state: Secondary | ICD-10-CM | POA: Diagnosis not present

## 2023-02-06 DIAGNOSIS — G8191 Hemiplegia, unspecified affecting right dominant side: Secondary | ICD-10-CM | POA: Diagnosis not present

## 2023-02-06 DIAGNOSIS — Z7982 Long term (current) use of aspirin: Secondary | ICD-10-CM

## 2023-02-06 DIAGNOSIS — I611 Nontraumatic intracerebral hemorrhage in hemisphere, cortical: Secondary | ICD-10-CM | POA: Diagnosis not present

## 2023-02-06 DIAGNOSIS — G629 Polyneuropathy, unspecified: Secondary | ICD-10-CM | POA: Diagnosis not present

## 2023-02-06 DIAGNOSIS — I69092 Facial weakness following nontraumatic subarachnoid hemorrhage: Secondary | ICD-10-CM | POA: Diagnosis not present

## 2023-02-06 DIAGNOSIS — N8110 Cystocele, unspecified: Secondary | ICD-10-CM | POA: Diagnosis present

## 2023-02-06 DIAGNOSIS — I480 Paroxysmal atrial fibrillation: Secondary | ICD-10-CM | POA: Diagnosis present

## 2023-02-06 DIAGNOSIS — Z7901 Long term (current) use of anticoagulants: Secondary | ICD-10-CM

## 2023-02-06 DIAGNOSIS — I69020 Aphasia following nontraumatic subarachnoid hemorrhage: Secondary | ICD-10-CM | POA: Diagnosis not present

## 2023-02-06 DIAGNOSIS — Z6835 Body mass index (BMI) 35.0-35.9, adult: Secondary | ICD-10-CM | POA: Diagnosis not present

## 2023-02-06 DIAGNOSIS — Z6837 Body mass index (BMI) 37.0-37.9, adult: Secondary | ICD-10-CM | POA: Diagnosis not present

## 2023-02-06 DIAGNOSIS — Z8551 Personal history of malignant neoplasm of bladder: Secondary | ICD-10-CM | POA: Diagnosis not present

## 2023-02-06 DIAGNOSIS — Z923 Personal history of irradiation: Secondary | ICD-10-CM

## 2023-02-06 DIAGNOSIS — I10 Essential (primary) hypertension: Secondary | ICD-10-CM | POA: Diagnosis not present

## 2023-02-06 DIAGNOSIS — R29700 NIHSS score 0: Secondary | ICD-10-CM | POA: Diagnosis present

## 2023-02-06 DIAGNOSIS — R310 Gross hematuria: Secondary | ICD-10-CM | POA: Diagnosis present

## 2023-02-06 DIAGNOSIS — I5032 Chronic diastolic (congestive) heart failure: Secondary | ICD-10-CM | POA: Diagnosis present

## 2023-02-06 DIAGNOSIS — H53461 Homonymous bilateral field defects, right side: Secondary | ICD-10-CM | POA: Diagnosis not present

## 2023-02-06 DIAGNOSIS — Z881 Allergy status to other antibiotic agents status: Secondary | ICD-10-CM

## 2023-02-06 DIAGNOSIS — Z91030 Bee allergy status: Secondary | ICD-10-CM

## 2023-02-06 DIAGNOSIS — I6389 Other cerebral infarction: Secondary | ICD-10-CM | POA: Diagnosis not present

## 2023-02-06 DIAGNOSIS — K5900 Constipation, unspecified: Secondary | ICD-10-CM | POA: Diagnosis not present

## 2023-02-06 DIAGNOSIS — Z7902 Long term (current) use of antithrombotics/antiplatelets: Secondary | ICD-10-CM

## 2023-02-06 DIAGNOSIS — I69012 Visuospatial deficit and spatial neglect following nontraumatic subarachnoid hemorrhage: Secondary | ICD-10-CM | POA: Diagnosis not present

## 2023-02-06 DIAGNOSIS — I872 Venous insufficiency (chronic) (peripheral): Secondary | ICD-10-CM | POA: Diagnosis present

## 2023-02-06 DIAGNOSIS — Z91010 Allergy to peanuts: Secondary | ICD-10-CM

## 2023-02-06 DIAGNOSIS — I6602 Occlusion and stenosis of left middle cerebral artery: Secondary | ICD-10-CM | POA: Diagnosis present

## 2023-02-06 DIAGNOSIS — I639 Cerebral infarction, unspecified: Secondary | ICD-10-CM | POA: Diagnosis not present

## 2023-02-06 DIAGNOSIS — E669 Obesity, unspecified: Secondary | ICD-10-CM | POA: Diagnosis not present

## 2023-02-06 HISTORY — PX: RADIOLOGY WITH ANESTHESIA: SHX6223

## 2023-02-06 LAB — COMPREHENSIVE METABOLIC PANEL
ALT: 19 U/L (ref 0–44)
AST: 20 U/L (ref 15–41)
Albumin: 3.5 g/dL (ref 3.5–5.0)
Alkaline Phosphatase: 43 U/L (ref 38–126)
Anion gap: 7 (ref 5–15)
BUN: 24 mg/dL — ABNORMAL HIGH (ref 8–23)
CO2: 26 mmol/L (ref 22–32)
Calcium: 8.9 mg/dL (ref 8.9–10.3)
Chloride: 105 mmol/L (ref 98–111)
Creatinine, Ser: 0.88 mg/dL (ref 0.44–1.00)
GFR, Estimated: >60 mL/min (ref >60)
Glucose, Bld: 100 mg/dL — ABNORMAL HIGH (ref 70–99)
Potassium: 4.1 mmol/L (ref 3.5–5.1)
Sodium: 138 mmol/L (ref 135–145)
Total Bilirubin: 0.8 mg/dL (ref 0.3–1.2)
Total Protein: 6.3 g/dL — ABNORMAL LOW (ref 6.5–8.1)

## 2023-02-06 LAB — CBC WITH DIFFERENTIAL/PLATELET
Abs Immature Granulocytes: 0.04 10*3/uL (ref 0.00–0.07)
Basophils Absolute: 0 10*3/uL (ref 0.0–0.1)
Basophils Relative: 0 %
Eosinophils Absolute: 0.1 10*3/uL (ref 0.0–0.5)
Eosinophils Relative: 1 %
HCT: 44.4 % (ref 36.0–46.0)
Hemoglobin: 13.9 g/dL (ref 12.0–15.0)
Immature Granulocytes: 0 %
Lymphocytes Relative: 17 %
Lymphs Abs: 2.2 10*3/uL (ref 0.7–4.0)
MCH: 27.5 pg (ref 26.0–34.0)
MCHC: 31.3 g/dL (ref 30.0–36.0)
MCV: 87.9 fL (ref 80.0–100.0)
Monocytes Absolute: 0.8 10*3/uL (ref 0.1–1.0)
Monocytes Relative: 6 %
Neutro Abs: 10.3 10*3/uL — ABNORMAL HIGH (ref 1.7–7.7)
Neutrophils Relative %: 76 %
Platelets: 244 10*3/uL (ref 150–400)
RBC: 5.05 MIL/uL (ref 3.87–5.11)
RDW: 16.1 % — ABNORMAL HIGH (ref 11.5–15.5)
WBC: 13.6 10*3/uL — ABNORMAL HIGH (ref 4.0–10.5)
nRBC: 0 % (ref 0.0–0.2)

## 2023-02-06 LAB — TSH: TSH: 1.154 u[IU]/mL (ref 0.350–4.500)

## 2023-02-06 SURGERY — IR WITH ANESTHESIA
Anesthesia: General

## 2023-02-06 MED ORDER — NITROGLYCERIN 1 MG/10 ML FOR IR/CATH LAB
INTRA_ARTERIAL | Status: AC
  Filled 2023-02-06: qty 10

## 2023-02-06 MED ORDER — PANTOPRAZOLE SODIUM 40 MG IV SOLR: 40.0000 mg | Freq: Every day | INTRAVENOUS | Status: DC

## 2023-02-06 MED ORDER — ACETAMINOPHEN 160 MG/5ML PO SOLN: 650.0000 mg | ORAL | Status: DC | PRN

## 2023-02-06 MED ORDER — IOHEXOL 300 MG/ML  SOLN
100.0000 mL | Freq: Once | INTRAMUSCULAR | Status: AC | PRN
  Administered 2023-02-06: 70 mL via INTRA_ARTERIAL

## 2023-02-06 MED ORDER — SODIUM CHLORIDE 0.9 % IV SOLN: INTRAVENOUS | Status: DC | PRN

## 2023-02-06 MED ORDER — CEFAZOLIN SODIUM-DEXTROSE 2-3 GM-%(50ML) IV SOLR
INTRAVENOUS | Status: DC | PRN
  Administered 2023-02-06: 2 g via INTRAVENOUS

## 2023-02-06 MED ORDER — LACTATED RINGERS IV SOLN: INTRAVENOUS | Status: DC | PRN

## 2023-02-06 MED ORDER — SUCCINYLCHOLINE CHLORIDE 200 MG/10ML IV SOSY
PREFILLED_SYRINGE | INTRAVENOUS | Status: DC | PRN
  Administered 2023-02-06: 80 mg via INTRAVENOUS

## 2023-02-06 MED ORDER — HEPARIN (PORCINE) 25000 UT/250ML-% IV SOLN
750.0000 [IU]/h | INTRAVENOUS | Status: DC
  Filled 2023-02-06: qty 250

## 2023-02-06 MED ORDER — HEPARIN (PORCINE) 25000 UT/250ML-% IV SOLN: 750.0000 [IU]/h | INTRAVENOUS | Status: DC

## 2023-02-06 MED ORDER — SODIUM CHLORIDE 0.9 % IV SOLN
INTRAVENOUS | Status: AC | PRN
  Administered 2023-02-06: 1 ug/kg/min via INTRAVENOUS

## 2023-02-06 MED ORDER — SODIUM CHLORIDE 0.9 % IV SOLN: INTRAVENOUS | Status: DC

## 2023-02-06 MED ORDER — PROPOFOL 10 MG/ML IV BOLUS
INTRAVENOUS | Status: DC | PRN
  Administered 2023-02-06: 50 mg via INTRAVENOUS
  Administered 2023-02-06: 100 mg via INTRAVENOUS

## 2023-02-06 MED ORDER — ROCURONIUM BROMIDE 10 MG/ML (PF) SYRINGE
PREFILLED_SYRINGE | INTRAVENOUS | Status: DC | PRN
  Administered 2023-02-06: 50 mg via INTRAVENOUS
  Administered 2023-02-06: 20 mg via INTRAVENOUS

## 2023-02-06 MED ORDER — TICAGRELOR 90 MG PO TABS
90.0000 mg | ORAL_TABLET | Freq: Once | ORAL | Status: AC
  Administered 2023-02-06: 90 mg via ORAL
  Filled 2023-02-06: qty 1

## 2023-02-06 MED ORDER — CANGRELOR TETRASODIUM 50 MG IV SOLR
INTRAVENOUS | Status: AC
  Filled 2023-02-06: qty 50

## 2023-02-06 MED ORDER — ASPIRIN 81 MG PO TBEC
81.0000 mg | DELAYED_RELEASE_TABLET | Freq: Once | ORAL | Status: AC
  Administered 2023-02-06: 81 mg via ORAL
  Filled 2023-02-06: qty 1

## 2023-02-06 MED ORDER — PROPOFOL 500 MG/50ML IV EMUL
INTRAVENOUS | Status: DC | PRN
  Administered 2023-02-06: 25 ug/kg/min via INTRAVENOUS

## 2023-02-06 MED ORDER — ONDANSETRON HCL 4 MG/2ML IJ SOLN
INTRAMUSCULAR | Status: DC | PRN
  Administered 2023-02-06: 4 mg via INTRAVENOUS

## 2023-02-06 MED ORDER — FENTANYL CITRATE (PF) 100 MCG/2ML IJ SOLN
INTRAMUSCULAR | Status: DC | PRN
  Administered 2023-02-06: 100 ug via INTRAVENOUS

## 2023-02-06 MED ORDER — DEXAMETHASONE SODIUM PHOSPHATE 10 MG/ML IJ SOLN
INTRAMUSCULAR | Status: DC | PRN
  Administered 2023-02-06: 5 mg via INTRAVENOUS

## 2023-02-06 MED ORDER — ACETAMINOPHEN 650 MG RE SUPP: 650.0000 mg | RECTAL | Status: DC | PRN

## 2023-02-06 MED ORDER — FENTANYL CITRATE (PF) 100 MCG/2ML IJ SOLN
INTRAMUSCULAR | Status: AC
  Filled 2023-02-06: qty 2

## 2023-02-06 MED ORDER — CLEVIDIPINE BUTYRATE 0.5 MG/ML IV EMUL
INTRAVENOUS | Status: AC
  Filled 2023-02-06: qty 50

## 2023-02-06 MED ORDER — CLEVIDIPINE BUTYRATE 0.5 MG/ML IV EMUL
INTRAVENOUS | Status: DC | PRN
  Administered 2023-02-06: 2 mg/h via INTRAVENOUS

## 2023-02-06 MED ORDER — SENNOSIDES-DOCUSATE SODIUM 8.6-50 MG PO TABS
1.0000 | ORAL_TABLET | Freq: Every evening | ORAL | Status: DC | PRN
  Filled 2023-02-06: qty 1

## 2023-02-06 MED ORDER — PHENYLEPHRINE 80 MCG/ML (10ML) SYRINGE FOR IV PUSH (FOR BLOOD PRESSURE SUPPORT)
PREFILLED_SYRINGE | INTRAVENOUS | Status: DC | PRN
  Administered 2023-02-06: 160 ug via INTRAVENOUS

## 2023-02-06 MED ORDER — EPHEDRINE SULFATE-NACL 50-0.9 MG/10ML-% IV SOSY
PREFILLED_SYRINGE | INTRAVENOUS | Status: DC | PRN
  Administered 2023-02-06: 2.5 mg via INTRAVENOUS
  Administered 2023-02-06: 5 mg via INTRAVENOUS

## 2023-02-06 MED ORDER — CANGRELOR BOLUS VIA INFUSION
INTRAVENOUS | Status: DC | PRN
  Administered 2023-02-06: 626.25 ug via INTRAVENOUS

## 2023-02-06 MED ORDER — ACETAMINOPHEN 325 MG PO TABS: 650.0000 mg | ORAL_TABLET | ORAL | Status: DC | PRN

## 2023-02-06 MED ORDER — SODIUM CHLORIDE (PF) 0.9 % IJ SOLN
INTRAVENOUS | Status: DC | PRN
  Administered 2023-02-06: 25 ug via INTRA_ARTERIAL

## 2023-02-06 MED ORDER — PHENYLEPHRINE HCL-NACL 20-0.9 MG/250ML-% IV SOLN
INTRAVENOUS | Status: DC | PRN
  Administered 2023-02-06: 40 ug/min via INTRAVENOUS

## 2023-02-06 MED ORDER — STROKE: EARLY STAGES OF RECOVERY BOOK: Freq: Once | Status: AC

## 2023-02-06 MED ORDER — CEFAZOLIN SODIUM-DEXTROSE 2-4 GM/100ML-% IV SOLN
INTRAVENOUS | Status: AC
  Filled 2023-02-06: qty 100

## 2023-02-06 MED ORDER — IOHEXOL 300 MG/ML  SOLN
50.0000 mL | Freq: Once | INTRAMUSCULAR | Status: AC | PRN
  Administered 2023-02-06: 40 mL via INTRA_ARTERIAL

## 2023-02-06 MED ORDER — LIDOCAINE 2% (20 MG/ML) 5 ML SYRINGE
INTRAMUSCULAR | Status: DC | PRN
  Administered 2023-02-06: 40 mg via INTRAVENOUS

## 2023-02-06 MED ORDER — IOHEXOL 350 MG/ML SOLN
75.0000 mL | Freq: Once | INTRAVENOUS | Status: AC | PRN
  Administered 2023-02-06: 75 mL via INTRAVENOUS

## 2023-02-06 NOTE — Anesthesia Procedure Notes (Signed)
Procedure Name: Intubation Date/Time: 02/06/2023 8:38 PM  Performed by: Tressia Miners, CRNAPre-anesthesia Checklist: Patient identified, Emergency Drugs available, Suction available and Patient being monitored Patient Re-evaluated:Patient Re-evaluated prior to induction Oxygen Delivery Method: Circle System Utilized Preoxygenation: Pre-oxygenation with 100% oxygen Induction Type: IV induction, Rapid sequence and Cricoid Pressure applied Laryngoscope Size: Mac and 3 Grade View: Grade I Tube type: Oral Number of attempts: 1 Airway Equipment and Method: Stylet Placement Confirmation: ETT inserted through vocal cords under direct vision, positive ETCO2 and breath sounds checked- equal and bilateral Secured at: 21 cm Tube secured with: Tape Dental Injury: Teeth and Oropharynx as per pre-operative assessment

## 2023-02-06 NOTE — H&P (Addendum)
Neurology H&P  CC: Intermittent transient left eye vision loss  History is obtained from: Patient and chart review  HPI: Loretta Gates is a 87 y.o. female with a past medical history significant for atrial fibrillation on Eliquis (last dose 4/30 a.m. s/p failed cardioversion 1 to 2 months ago), hypertension bladder cancer s/p TURBT (02/05/2023), heart failure, chronic venous insufficiency, left breast cancer s/p chemotherapy and mastectomy (2008), remote head/neck radiation (1940s at age 26), left thyroidectomy and right inferior parathyroidectomy, anemia, mild neuropathy  She first had an episode on Sunday at 5 PM lasting 45 seconds of her vision changing in the right eye, resolving to baseline after 3 minutes with 2 additional spells today (2 PM and 6:10 PM) both lasting a few minutes, with full return to baseline as of 6:14 PM.  She denies any other focal neurological symptoms lately.  She does report some mild hematuria today after her transurethral resection of bladder tumor with postoperative gemcitabine yesterday.  Otherwise on brief review of systems prior to emergent procedure, denies any other symptoms.  Due to recurrent symptoms at [facility] she was emergently evaluated by video neurology and decision was made to emergently assess her left carotid occlusion with potential for stent placement due to concern for stump embolization versus collateral failure versus acute occlusion.  Case was discussed with Dr. Otelia Limes (Teleneurologist), Dr. Rush Landmark ED provider, Dr. Corliss Skains neurointerventional radiology, Dr. Roda Shutters of stroke team, and myself  LKW: N/A, symptoms are fully resolved at this time Thrombolytic given?: No, symptoms resolved, last dose of Eliquis this morning IA performed?:  Yes, diagnostic angiogram with high likelihood of intervention due to recurrent symptoms Premorbid modified rankin scale:      0 - No symptoms.  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 patel;   2006  s/p left thyroidectomy w/ right inferior parathyoidectomy  (per path speciman marked parathyroid but only thyroid tissue)   Hypertension    IDA (iron deficiency anemia)    hematology/ oncologist--- dr ennever/ sarah carter NP;  treated w/ IV iron infusions   Multinodular thyroid    Neuropathy    mild hands/ feet, uses cane   Nocturia more than twice per night    Nocturnal leg cramps    OA (osteoarthritis)    knees   OAB (overactive bladder)    Osteoporosis    PAF (paroxysmal atrial fibrillation) [facility])    cardiologist--- dr Jens Som   PONV (postoperative nausea and  vomiting)    Vitamin D deficiency    Wears glasses    Past Surgical History:  Procedure Laterality Date   BREAST LUMPECTOMY WITH RADIOACTIVE SEED LOCALIZATION Right 10/25/2021   Procedure: RIGHT BREAST LUMPECTOMY WITH RADIOACTIVE SEED LOCALIZATION;  Surgeon: Harriette Bouillon, MD;  Location: [facility];  Service: General;  Laterality: Right;   CARDIOVERSION N/A 01/01/2023   Procedure: CARDIOVERSION;   Surgeon: [name], MD;  Location: [facility] ENDOSCOPY;  Service: Cardiovascular;  Laterality: N/A;   CHOLECYSTECTOMY, LAPAROSCOPIC  02/25/2017   @[facility]    COLONOSCOPY WITH ESOPHAGOGASTRODUODENOSCOPY (EGD)  2022   MASTECTOMY WITH AXILLARY LYMPH NODE DISSECTION Left 03/05/2007   @ [facility]   OVARIAN CYST SURGERY     age 32;   abdominal   THYROIDECTOMY, PARTIAL Left 2006   left lobectomy and right infertior parathyroidectomy   TONSILLECTOMY     child    Current Outpatient Medications  Medication Instructions   cholestyramine (QUESTRAN) 4 g, Oral, See admin instructions, Mix 1 teaspoon in water and take at 6pm daily with warfarin.   cinacalcet (SENSIPAR) 30 mg, Oral, Daily with breakfast   denosumab (PROLIA) 60 mg, Subcutaneous, Every 6 months   Eliquis 5 mg, Oral, 2 times daily   ergocalciferol (VITAMIN D2) 50,000 Units, Oral, Every 14 days, Every other saturday   furosemide (LASIX) 20 MG tablet Alternate taking 20 mg po daily with taking 30 mg po daily ever other day   irbesartan (AVAPRO) 300 mg, Oral, Daily   Jardiance 10 mg, Oral, Daily   Magnesium Oxide -Mg Supplement 500 mg, Oral, Daily   metoprolol tartrate (LOPRESSOR) 75 mg, Oral, 2 times daily   omeprazole (PRILOSEC) 40 mg, Oral, 2 times daily before lunch and supper   oxybutynin (DITROPAN XL) 15 mg, Oral, Daily at bedtime   traMADol (ULTRAM) 50 mg, Oral, Every 6 hours PRN     Family History  Problem Relation Age of Onset   Sudden death Neg Hx     Social History:  reports that she has never smoked. She has never used smokeless tobacco. She reports that she does not drink alcohol and does not use drugs.   Exam: Current vital signs: BP (!) 150/89   Pulse 76   Temp 98.1 F (36.7 C) (Oral)   Resp (!) 21   Ht 4\' 11"  (1.499 m)   Wt 83.5 kg   SpO2 91%   BMI 37.16 kg/m  Vital signs in last 24 hours: Temp:  [98.1 F (36.7 C)] 98.1 F (36.7 C) (04/30 1441) Pulse Rate:  [76-88] 76 (04/30 1915) Resp:  [16-21] 21 (04/30  1915) BP: (100-150)/(62-89) 150/89 (04/30 1915) SpO2:  [91 %-97 %] 91 % (04/30 1915) Weight:  [83.5 kg] 83.5 kg (04/30 1441)   Physical Exam  Constitutional: Appears well-developed and well-nourished.  Psych: Affect pleasant and cooperative Eyes: No scleral injection HENT: No oropharyngeal obstruction.  MSK: no joint deformities.  Cardiovascular: Normal rate and regular rhythm. Perfusing extremities well Respiratory: Effort normal, non-labored breathing GI: Soft.  No distension. There is no tenderness.  Skin: Warm dry and intact visible skin  Neuro: Mental Status: Patient is awake, alert, oriented to person, place, month (reports it is May), year, and situation. Patient is able to give a clear and coherent history. No signs of aphasia or neglect Cranial Nerves: II: Visual Fields are full. Pupils are equal, round, and reactive to light. No afferent pupillary defect, mild hippus bilaterally III,IV, VI: EOMI without ptosis or diploplia.  V:  Facial sensation is symmetric to light touch  VII: Facial movement is symmetric.  VIII: hearing is intact to voice X: Uvula elevates symmetrically XI: Shoulder shrug is symmetric. XII: tongue is midline without atrophy or fasciculations.  Motor: Tone is normal. Bulk is normal. No pronator drift. No drift of bilateral lower extremities Sensory: Sensation is symmetric to light touch and temperature in the arms and legs. Cerebellar: FNF and HKS are intact bilaterally Gait:  Deferred in acute setting   NIHSS total 0 Performed at time of patient arrival to ED    I have reviewed labs in epic and the results pertinent to this consultation are:  Basic Metabolic Panel: Recent Labs  Lab 02/05/23 0949 02/06/23 1532  NA 142 138  K 4.3 4.1  CL 102 105  CO2  --  26  GLUCOSE 111* 100*  BUN 25* 24*  CREATININE 0.80 0.88  CALCIUM  --  8.9    CBC: Recent Labs  Lab 02/05/23 0949 02/06/23 1532  WBC  --  13.6*  NEUTROABS  --  10.3*  HGB  16.3* 13.9  HCT 48.0* 44.4  MCV  --  87.9  PLT  --  244    Coagulation Studies: No results for input(s): "LABPROT", "INR" in the last 72 hours.    I have reviewed the images obtained:  CTA head and neck personally reviewed, agree with radiology:   No acute intracranial process on Head CT. On CTA: 1. 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 No other intracranial large vessel occlusion or significant stenosis. 3. No hemodynamically significant stenosis in the neck. 4. 3 mm subsolid nodule in the right upper lobe. No follow-up needed if patient is low-risk.This recommendation follows the consensus statement: Guidelines for Management of Incidental Pulmonary Nodules Detected on CT Images: From the Fleischner Society 2017; Radiology 2017; 284:228-243. 5. Aortic atherosclerosis.   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   Recommendations: # Left carotid artery occlusion - Emergent diagnostic angio and potential intervention - Stroke labs HgbA1c, fasting lipid panel - MRI brain when stabilized  - Frequent neuro checks per post-procedure protocol  - Echocardiogram not indicated in the absence of new cardiac symptoms and known atrial fibrillation  - Antiplatelets per NeuroIR  - Hold anticoagulation pending MRI brain and NeuroIR clearance  - Telemetry monitoring - Blood pressure goal   - Post successful uncomplicated revascularization SBP 120 - 140 for 24 hours; if complications have arisen or only partial revascularization reach out to interventionalist or neurologist on call for BP goal - PT consult, OT consult, Speech consult, unless patient is back to baseline - Admitted to stroke service  # Afib - Hold AC - may replace home  metoprolol 75 mg BID with 5 mg IV q6hr (slightly lower than home dose based on 5:1 PO to IV conversion), to avoid rebound tachycardia (after confirming home medication regimen given some dose adjustments noted on recent records)  - most recent dispense was 30 tabs of 25 mg, filled 02/01/2023 so holding off on replacement at this time  # HTN - Holding home meds for now  # Cholesterol  - Goal LDL < 70  # Recent TURBT  - Monitor   Brooke Dare MD-PhD [facility] [phone] Available 7 AM to 7 PM, outside these hours please contact  Neurologist on call listed on AMION   Total critical care time: 45 minutes   Critical care time was exclusive of separately billable procedures and treating other patients.   Critical care was necessary to treat or prevent imminent or life-threatening deterioration.   Critical care was time spent personally by me on the following activities: development of treatment plan with patient and/or surrogate as well as nursing, discussions with consultants/primary team, evaluation of patient's response to treatment, examination of patient, obtaining history from patient or surrogate, ordering and performing treatments and interventions, ordering and review of laboratory studies, ordering and review of radiographic studies, and re-evaluation of patient's condition as needed, as documented above.

## 2023-02-06 NOTE — ED Notes (Signed)
Pt reports loss of vision in left eye that lasted approximately 45 seconds, then resolved. Provider informed

## 2023-02-06 NOTE — ED Provider Notes (Signed)
[facility] Provider Note   CSN: [id number] Arrival date & time: 02/06/23  1429     History  Chief Complaint  Patient presents with   Loss of Vision    Loretta Gates is a 87 y.o. female.  The history is provided by the patient, the spouse and medical records. No language interpreter was used.  Neurologic Problem This is a recurrent problem. The current episode started 2 days ago. The problem occurs every several days. The problem has been resolved. Pertinent negatives include no chest pain, no abdominal pain, no headaches and no shortness of breath. Nothing aggravates the symptoms. Nothing relieves the symptoms. She has tried nothing for the symptoms. The treatment provided no relief.       Home Medications Prior to Admission medications   Medication Sig Start Date End Date Taking? Authorizing Provider  apixaban (ELIQUIS) 5 MG TABS tablet TAKE ONE TABLET BY MOUTH TWICE A DAY 12/14/22   Copland, Gwenlyn Found, MD  cholestyramine (QUESTRAN) 4 GM/DOSE powder Take 1 packet (4 g total) by mouth See admin instructions. Mix 1 teaspoon in water and take at 6pm daily with warfarin. Patient taking differently: Take 4 g by mouth See admin instructions. Mix 1 teaspoon in water and take at 6pm daily 11/06/22   Copland, Gwenlyn Found, MD  cinacalcet (SENSIPAR) 30 MG tablet Take 30 mg by mouth daily with breakfast.    [provider]  denosumab (PROLIA) 60 MG/ML SOSY injection Inject 60 mg into the skin every 6 (six) months.    [provider]  empagliflozin (JARDIANCE) 10 MG TABS tablet TAKE ONE TABLET BY MOUTH ONE TIME DAILY Patient taking differently: Take 10 mg by mouth daily. 12/14/22   Copland, Gwenlyn Found, MD  ergocalciferol (VITAMIN D2) 1.25 MG (50000 UT) capsule Take 50,000 Units by mouth every 14 (fourteen) days. Every other saturday    [provider]  furosemide (LASIX) 20 MG tablet Alternate taking 20 mg po daily with taking  30 mg po daily ever other day Patient taking differently: Take 20 mg by mouth as directed. Alternate taking 20 mg po daily with taking 30 mg po daily ever other day 01/10/23   Copland, Gwenlyn Found, MD  irbesartan (AVAPRO) 300 MG tablet Take 1 tablet (300 mg total) by mouth daily. Patient taking differently: Take 300 mg by mouth daily. 05/29/22   Copland, Gwenlyn Found, MD  Magnesium Oxide -Mg Supplement 500 MG TABS Take 500 mg by mouth daily.    [provider]  metoprolol tartrate (LOPRESSOR) 50 MG tablet Take 1.5 tablets (75 mg total) by mouth 2 (two) times daily. 11/20/22   Copland, Gwenlyn Found, MD  omeprazole (PRILOSEC) 40 MG capsule Take 1 capsule (40 mg total) by mouth 2 (two) times daily before lunch and supper. Patient taking differently: Take 40 mg by mouth in the morning and at bedtime. 12/25/22   Copland, Gwenlyn Found, MD  oxybutynin (DITROPAN XL) 15 MG 24 hr tablet Take 1 tablet (15 mg total) by mouth at bedtime. Patient taking differently: Take 15 mg by mouth at bedtime. 05/05/22   Copland, Gwenlyn Found, MD  traMADol (ULTRAM) 50 MG tablet Take 1 tablet (50 mg total) by mouth every 6 (six) hours as needed. 02/05/23 02/05/24  Noel Christmas, MD      Allergies    Bee venom, Levaquin [levofloxacin], and Peanut-containing drug products    Review of Systems   Review of Systems  Constitutional:  Negative for chills, fatigue and fever.  HENT:  Negative for congestion.   Eyes:  Positive for visual disturbance (resolved now). Negative for photophobia, pain and redness.  Respiratory:  Negative for cough, chest tightness and shortness of breath.   Cardiovascular:  Negative for chest pain.  Gastrointestinal:  Negative for abdominal pain, constipation, diarrhea, nausea and vomiting.  Genitourinary:  Negative for dysuria, flank pain and frequency.  Musculoskeletal:  Negative for back pain, neck pain and neck stiffness.  Skin:  Negative for rash and wound.  Neurological:  Negative for dizziness, speech  difficulty, weakness, light-headedness, numbness and headaches.  Psychiatric/Behavioral:  Negative for agitation and confusion.   All other systems reviewed and are negative.   Physical Exam Updated Vital Signs BP 117/62 (BP Location: Left Arm)   Pulse 88   Temp 98.1 F (36.7 C) (Oral)   Resp 17   Ht 4\' 11"  (1.499 m)   Wt 83.5 kg   SpO2 97%   BMI 37.16 kg/m  Physical Exam Vitals and nursing note reviewed.  Constitutional:      General: She is not in acute distress.    Appearance: She is well-developed. She is not ill-appearing, toxic-appearing or diaphoretic.  HENT:     Head: Normocephalic and atraumatic.     Nose: No congestion or rhinorrhea.     Mouth/Throat:     Mouth: Mucous membranes are moist.     Pharynx: No oropharyngeal exudate or posterior oropharyngeal erythema.  Eyes:     Extraocular Movements: Extraocular movements intact.     Conjunctiva/sclera: Conjunctivae normal.     Pupils: Pupils are equal, round, and reactive to light.  Neck:     Vascular: No carotid bruit.  Cardiovascular:     Rate and Rhythm: Normal rate. Rhythm irregular.     Heart sounds: No murmur heard. Pulmonary:     Effort: Pulmonary effort is normal. No respiratory distress.     Breath sounds: Normal breath sounds. No wheezing, rhonchi or rales.  Chest:     Chest wall: No tenderness.  Abdominal:     Palpations: Abdomen is soft.     Tenderness: There is no abdominal tenderness.  Musculoskeletal:        General: No swelling or tenderness.     Cervical back: Neck supple. No tenderness.     Right lower leg: No edema.     Left lower leg: No edema.  Skin:    General: Skin is warm and dry.     Capillary Refill: Capillary refill takes less than 2 seconds.     Findings: No erythema or rash.  Neurological:     General: No focal deficit present.     Mental Status: She is alert.     Cranial Nerves: No cranial nerve deficit.     Sensory: No sensory deficit.     Motor: No weakness.      Coordination: Coordination normal.  Psychiatric:        Mood and Affect: Mood normal.     ED Results / Procedures / Treatments   Labs (all labs ordered are listed, but only abnormal results are displayed) Labs Reviewed  CBC WITH DIFFERENTIAL/PLATELET - Abnormal; Notable for the following components:      Result Value   WBC 13.6 (*)    RDW 16.1 (*)    Neutro Abs 10.3 (*)    All other components within normal limits  COMPREHENSIVE METABOLIC PANEL - Abnormal; Notable for the following components:   Glucose, Bld  100 (*)    BUN 24 (*)    Total Protein 6.3 (*)    All other components within normal limits  TSH    EKG EKG Interpretation  Date/Time:  Tuesday February 06 2023 15:03:51 EDT Ventricular Rate:  65 PR Interval:    QRS Duration: 78 QT Interval:  376 QTC Calculation: 391 R Axis:   67 Text Interpretation: Atrial fibrillation when compared to prior, now afib. No STEMI Confirmed by Theda Belfast ([id number]) on 02/06/2023 3:42:53 PM  Radiology CT ANGIO HEAD NECK W WO CM  Result Date: 02/06/2023 CLINICAL DATA:  Transient monocular vision loss in the left RI EXAM: CT ANGIOGRAPHY HEAD AND NECK WITH AND WITHOUT CONTRAST TECHNIQUE: Multidetector CT imaging of the head and neck was performed using the standard protocol during bolus administration of intravenous contrast. Multiplanar CT image reconstructions and MIPs were obtained to evaluate the vascular anatomy. Carotid stenosis measurements (when applicable) are obtained utilizing NASCET criteria, using the distal internal carotid diameter as the denominator. RADIATION DOSE REDUCTION: This exam was performed according to the departmental dose-optimization program which includes automated exposure control, adjustment of the mA and/or kV according to patient size and/or use of iterative reconstruction technique. CONTRAST:  75mL OMNIPAQUE IOHEXOL 350 MG/ML SOLN COMPARISON:  None Available. FINDINGS: CT HEAD FINDINGS Brain: No evidence of acute  infarct, hemorrhage, mass, mass effect, or midline shift. No hydrocephalus or extra-axial fluid collection. Periventricular white matter changes, likely the sequela of chronic small vessel ischemic disease. Vascular: No hyperdense vessel. Skull: Negative for fracture or focal lesion. Sinuses/Orbits: No acute finding. Other: The mastoid air cells are well aerated. CTA NECK FINDINGS Aortic arch: Standard branching. Imaged portion shows no evidence of aneurysm or dissection. No significant stenosis of the major arch vessel origins. Mild aortic atherosclerosis. Right carotid system: No evidence of dissection, occlusion, or hemodynamically significant stenosis (greater than 50%). Retropharyngeal course of the mid right ICA Left carotid system: Occlusion of the left ICA just distal to its take-off (series 12, image 117 and series 11, image 116). No other significant stenosis in the opacified portion of the left carotid. No evidence of dissection. Vertebral arteries: No evidence of dissection, occlusion, or hemodynamically significant stenosis (greater than 50%). Skeleton: No acute osseous abnormality. Degenerative changes in the cervical spine. Other neck: Negative. Upper chest: 3 mm subsolid nodule in the right upper lobe (series 11, image 34). Emphysema. Review of the MIP images confirms the above findings CTA HEAD FINDINGS Anterior circulation: Nonopacification of the left ICA until the distal cavernous segment, where there is minimal opacification, with more robust opacification in the supraclinoid segment, likely retrograde. There is opacification the ophthalmic artery. The right internal carotid artery is patent to the terminus without significant stenosis. A1 segments patent, hypoplastic on the left. Normal anterior communicating artery. Anterior cerebral arteries are patent to their distal aspects without significant stenosis. No M1 stenosis or occlusion. MCA branches perfused to their distal aspects without  significant stenosis. Posterior circulation: Vertebral arteries patent to the vertebrobasilar junction without significant stenosis. Posterior inferior cerebellar arteries patent proximally. Basilar patent to its distal aspect without significant stenosis. Superior cerebellar arteries patent proximally. Patent left P1. Fetal origin of the right PCA from the right posterior communicating artery. PCAs perfused to their distal aspects without significant stenosis. The bilateral posterior communicating arteries are patent. Venous sinuses: As permitted by contrast timing, patent. Anatomic variants: Fetal origin of the right PCA. Review of the MIP images confirms the above findings IMPRESSION: 1. Occlusion of  the left ICA just distal to its take-off, with minimal opacification in the distal cavernous segment and more robust opacification in the supraclinoid segment, likely from the anterior communicating artery and posterior communicating artery 2. No other intracranial large vessel occlusion or significant stenosis. 3. No hemodynamically significant stenosis in the neck. 4. 3 mm subsolid nodule in the right upper lobe. No follow-up needed if patient is low-risk.This recommendation follows the consensus statement: Guidelines for Management of Incidental Pulmonary Nodules Detected on CT Images: From the Fleischner Society 2017; Radiology 2017; 284:228-243. 5. Aortic atherosclerosis. Aortic Atherosclerosis (ICD10-I70.0). These results were called by telephone at the time of interpretation on 02/06/2023 at 5:23 pm to provider [facility] , who verbally acknowledged these results. Electronically Signed   By: Wiliam Ke M.D.   On: 02/06/2023 17:24    Procedures Procedures    CRITICAL CARE Performed by: Canary Brim Leverne Tessler Total critical care time: 60 minutes Critical care time was exclusive of separately billable procedures and treating other patients. Critical care was necessary to treat or prevent  imminent or life-threatening deterioration. Critical care was time spent personally by me on the following activities: development of treatment plan with patient and/or surrogate as well as nursing, discussions with consultants, evaluation of patient's response to treatment, examination of patient, obtaining history from patient or surrogate, ordering and performing treatments and interventions, ordering and review of laboratory studies, ordering and review of radiographic studies, pulse oximetry and re-evaluation of patient's condition.  Medications Ordered in ED Medications  iohexol (OMNIPAQUE) 350 MG/ML injection 75 mL (75 mLs Intravenous Contrast Given 02/06/23 1635)    ED Course/ Medical Decision Making/ A&P                             Medical Decision Making Amount and/or Complexity of Data Reviewed Labs: ordered. Radiology: ordered.  Risk Prescription drug management. Decision regarding hospitalization.    KEYETTA HOLLINGWORTH is a 87 y.o. female with a past medical history significant of atrial fibrillation on Eliquis therapy, GERD, hypertension, previous breast cancer, CHF, osteoporosis, previous cardioversion, and recent bladder scraping procedure yesterday at [facility] who presents for transient neurologic problem.  According to patient, she has held her Eliquis for the last 2 days so that she could have a bladder scraping procedure at [facility] long yesterday.  She says that it was successful but 2 days ago she had a transient left eye complete vision loss lasting for several minutes.  It then returned.  She reports no associated temporal pain, headache, or trauma.  She denies any other neurologic complaints such as numbness, tingling, weakness of extremities, speech difficulties, or coordination problems.  She reports she felt at her baseline yesterday but then today after restarting her Eliquis this morning, she had an episode around 130 lasting for several minutes where she had  transient left eye vision loss again.  She reports it was bright with some flashes and lasted several minutes.  She reports again no other neurologic complaints but came in for evaluation.  On exam, I cannot appreciate a carotid bruit.  Lungs were clear and chest was nontender.  No murmur.  Abdomen nontender.  Good pulses in extremities.  No focal neurologic deficits on my exam with intact sensation, strength, and pulses in extremities.  Normal finger-nose-finger testing.  Symmetric smile.  Clear speech.  Pupils head intact extract movements and were round and reactive symmetrically.  Clinically I am concerned about TIA  and possible amaurosis fugax.  I spoke to Dr. Otelia Limes with neurology on consultation who recommended admission to [facility] for full TIA workup including MRIs.  We will get CTA head and neck now and screening labs but after will discuss with patient about admission for full stroke workup.  I am concerned that patient may have developed some clots during the Eliquis holiday so her she could have a procedure yesterday.  Anticipate admission for stroke workup.  CTA was completed and shows a left internal carotid occlusion.  This is likely the cause of her symptoms.  I spoke to neurology who would like her admitted to [facility] for MRI and further workup.  She may need either vascular or neurovascular intervention and I will see her when she gets admitted.  Patient and I had a long conversation and she is now amenable to the admission.  Will order the MRI and call hospitalist for admission.  Neurology requested heparin without a bolus for her and admission.  6:23 PM Was just informed by nursing and then family that patient had another 45-second episode of left eye vision loss.  She is now back to baseline again.  It just happened several months ago.  Patient says she is having no speech difficulties or other symptoms.  She reports her vision is at baseline now.  I spoke to neurology  who would like her care expedited and we will do ED to ED transfer to get seen.  Patient may need IR evaluation as well.  Will defer to [facility] neurology team when she arrives.  6:28 PM Patient is amenable to ED to ED transfer while awaiting admission.  Spoke to Dr. Chaney Malling who accept the patient for transfer.  6:39 PM Spoke to the neurology teams and they would like to activate the teleneurology robot so they can evaluate to determine if patient needs to go straight to IR.  Will await their further recommendations but will continue to start the admission process.  6:43 PM Neurology just confirmed they want to fully activate as a code stroke at this time.  Will start it.  7:48 PM Neurology now would like to not do heparin and instead do aspirin and Brilinta and she was activated as a code IR to get taken directly to the interventional radiology suite for angiogram and possible thrombectomy.  Neurology had a long conversation consenting her and we will fill out the consent with her.  She will be admitted to neurology after interventional radiology management.  Patient transported by [facility] without difficulty.        Final Clinical Impression(s) / ED Diagnoses Final diagnoses:  Transient vision disturbance of left eye  Carotid occlusion, left     Clinical Impression: 1. Transient vision disturbance of left eye   2. Carotid occlusion, left     Disposition: Admit but ED to ED transfer first to get seen by neurology faster.  This note was prepared with assistance of Conservation officer, historic buildings. Occasional wrong-word or sound-a-like substitutions may have occurred due to the inherent limitations of voice recognition software.      Estephanie Hubbs, Canary Brim, MD 02/06/23 2000

## 2023-02-06 NOTE — ED Notes (Signed)
Lab contacted about [facility] for Hep transport. Will draw upon their arrival.

## 2023-02-06 NOTE — ED Triage Notes (Signed)
Reports sudden onset of left sided vision lost, episode on Sunday lasted bout 3 mins and then it got better. Another similar episode occurred today around noon. Denies any unilateral weakness, -headache.

## 2023-02-06 NOTE — ED Notes (Signed)
Pt placed on bedside commode with assistance from daughter.

## 2023-02-06 NOTE — Consult Note (Signed)
[facility] TeleNeurology Consult Services    Date of Service:  02/06/2023      Metrics: Last Known Well: 6:14 PM Symptoms: As per HPI.  Patient is not a candidate for thrombolytic due to NIHSS of 0 and poor risk to benefit ratio given the extensive length of the left ICA clot seen on CTA.   Location of the provider: [facility]  Location of the patient: [facility] Pre-Morbid Modified Rankin Scale: 0 Time Code Stroke Initated:  6:50 PM Time neurologist arrived:  6:50 PM Time NIHSS completed: 7:05 PM    This consult was provided via telemedicine with 2-way video and audio communication. The patient/family was informed that care would be provided in this way and agreed to receive care in this manner.   ED Physician notified of diagnostic impression and management plan at: 7:27 PM   Assessment: 87 year old female presenting with repeat episodes of amaurosis fugax  - Exam is nonfocal. Vision back to baseline. NIHSS 0 - CT head: No evidence of acute infarct, hemorrhage, mass, mass effect, or midline shift. Periventricular white matter changes, likely the sequela of chronic small vessel ischemic disease. 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 No other intracranial large vessel occlusion or significant stenosis. No hemodynamically significant stenosis in the neck.  - Recent TTE in January: Left ventricular ejection fraction, by estimation, is 55 to 60%.  Left atrial size was moderately dilated. Right atrial size was mildly dilated. No mural thrombus or valvular vegetation mentioned in the report.  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  - The patient is a VIR candidate. Risks/benefits of the procedure were discussed extensively with patient and her daughter, including approximately 50% chance of significant improvement relative to an approximate 10% chance of subarachnoid hemorrhage with possibility of significant worsening including death. The patient expressed understanding and provided informed consent to proceed with VIR. All questions answered. Consent form signed with the assistance of the ED team at [facility].     Recommendations: - Transferring emergently to [facility] for 4-vessel cerebral angiogram with possible thrombectomy under Dr. Corliss Skains - Admitting to Neuro ICU after VIR.  - Post-IR order set to include frequent neuro checks and BP management.   - Post-IR medications per Dr. Corliss Skains  - Loading with ASA 81 mg and Brilinta 90 mg po while at [facility] prior to transfer.   - DVT prophylaxis with SCDs.  - MRI brain  - PT/OT/Speech.  - NPO until passes swallow evaluation.  - Telemetry monitoring - Fasting lipid panel, HgbA1c    ------------------------------------------------------------------------------   History of Present Illness: Loretta Gates is an 87 year old female with a history of bladder cancer, chronic diastolic HF, chronic venous insufficiency, breast cancer s/p chemo in 2008, prior head and neck radiation in the 1940's when she was age 47, prior left thyroidectomy and right inferior parathyroidectomy, HTN, anemia, mild neuropathy, vitamin D deficiency and paroxysmal atrial fibrillation on Eliquis s/p failed cardioversion approximately 1-2  months ago who presents to the [facility] ED with recurrent spells of left amaurosis fugax. First spell was on Sunday at 5 PM initially with "whited out" vision that progressed  to total blackening of vision lasting for 45 seconds then gradually resolving back to baseline over about 3 minutes. The second spell was at 2 PM today of similar duration, followed by a 3rd spell this evening at 6:10 PM while watching TV in the ED, also lasting for the same amount of time. She is now back to baseline. LKN 6:14 PM after resolution of the most recent attack. She has had no right eye vision symptoms as well as no right sided numbness, weakness, incoordination, facial droop, speech deficit or gait unsteadiness.   Of note, she had been off her Eliquis for 3 days starting on Friday for a bladder scraping procedure. She restarted her Eliquis at 9 AM today.   She is completely independent at baseline, with an mRS of 0.      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 patel;   2006  s/p left thyroidectomy w/ right inferior parathyoidectomy  (per path speciman marked parathyroid but only thyroid tissue)   Hypertension    IDA (iron deficiency anemia)    hematology/ oncologist--- dr  ennever/ sarah carter NP;  treated w/ IV iron infusions   Multinodular thyroid    Neuropathy    mild hands/ feet, uses cane   Nocturia more than twice per night    Nocturnal leg cramps    OA (osteoarthritis)    knees   OAB (overactive bladder)    Osteoporosis    PAF (paroxysmal atrial fibrillation) [facility])    cardiologist--- dr Jens Som   PONV (postoperative nausea and vomiting)    Vitamin D deficiency    Wears glasses       Past Surgical History: Past Surgical History:  Procedure Laterality Date   BREAST LUMPECTOMY WITH RADIOACTIVE SEED LOCALIZATION Right 10/25/2021   Procedure: RIGHT BREAST LUMPECTOMY WITH RADIOACTIVE SEED LOCALIZATION;  Surgeon: Harriette Bouillon, MD;  Location: [facility];  Service: General;  Laterality: Right;   CARDIOVERSION N/A 01/01/2023   Procedure: CARDIOVERSION;  Surgeon: [name], MD;  [facility];  Service: Cardiovascular;  Laterality: N/A;   CHOLECYSTECTOMY, LAPAROSCOPIC  02/25/2017   @[facility]    COLONOSCOPY WITH ESOPHAGOGASTRODUODENOSCOPY (EGD)  2022   MASTECTOMY WITH AXILLARY LYMPH NODE DISSECTION Left 03/05/2007   @ [facility]   OVARIAN CYST SURGERY  age 27;   abdominal   THYROIDECTOMY, PARTIAL Left 2006   left lobectomy and right infertior parathyroidectomy   TONSILLECTOMY     child       Medications:  No current facility-administered medications on file prior to encounter.   Current Outpatient Medications on File Prior to Encounter  Medication Sig Dispense Refill   apixaban (ELIQUIS) 5 MG TABS tablet TAKE ONE TABLET BY MOUTH TWICE A DAY 60 tablet 3   cholestyramine (QUESTRAN) 4 GM/DOSE powder Take 1 packet (4 g total) by mouth See admin instructions. Mix 1 teaspoon in water and take at 6pm daily with warfarin. (Patient taking differently: Take 4 g by mouth See admin instructions. Mix 1 teaspoon in water and take at 6pm daily) 378 g 3   cinacalcet (SENSIPAR) 30 MG tablet Take 30 mg by mouth daily with breakfast.      denosumab (PROLIA) 60 MG/ML SOSY injection Inject 60 mg into the skin every 6 (six) months.     empagliflozin (JARDIANCE) 10 MG TABS tablet TAKE ONE TABLET BY MOUTH ONE TIME DAILY (Patient taking differently: Take 10 mg by mouth daily.) 30 tablet 3   ergocalciferol (VITAMIN D2) 1.25 MG (50000 UT) capsule Take 50,000 Units by mouth every 14 (fourteen) days. Every other saturday     furosemide (LASIX) 20 MG tablet Alternate taking 20 mg po daily with taking 30 mg po daily ever other day (Patient taking differently: Take 20 mg by mouth as directed. Alternate taking 20 mg po daily with taking 30 mg po daily ever other day) 135 tablet 3   irbesartan (AVAPRO) 300 MG tablet Take 1 tablet (300 mg total) by mouth daily. (Patient taking differently: Take 300 mg by mouth daily.) 90 tablet 1   Magnesium Oxide -Mg Supplement 500 MG TABS Take 500 mg by mouth daily.     metoprolol tartrate (LOPRESSOR) 50 MG tablet Take 1.5 tablets (75 mg total) by mouth 2 (two) times daily. 270 tablet 1   omeprazole (PRILOSEC) 40 MG capsule Take 1 capsule (40 mg total) by mouth 2 (two) times daily before lunch and supper. (Patient taking differently: Take 40 mg by mouth in the morning and at bedtime.) 180 capsule 1   oxybutynin (DITROPAN XL) 15 MG 24 hr tablet Take 1 tablet (15 mg total) by mouth at bedtime. (Patient taking differently: Take 15 mg by mouth at bedtime.) 90 tablet 3   traMADol (ULTRAM) 50 MG tablet Take 1 tablet (50 mg total) by mouth every 6 (six) hours as needed. 20 tablet 0         Social History: Non-smoker.    Family History:  None listed in Epic   ROS: As per HPI. Denies any additional neurological complaints.    Anticoagulant use:  Eliquis   Antiplatelet use: No   Examination:   BP 100/64   Pulse 76   Temp 98.1 F (36.7 C) (Oral)   Resp 18   Ht 4\' 11"  (1.499 m)   Wt 83.5 kg   SpO2 96%   BMI 37.16 kg/m      1A: Level of Consciousness - 0 1B: Ask Month and Age - 0 1C: Blink Eyes &  Squeeze Hands - 0 2: Test Horizontal Extraocular Movements - 0 3: Test Visual Fields - 0 4: Test Facial Palsy (Use Grimace if Obtunded) - 0 5A: Test Left Arm Motor Drift - 0 5B: Test Right Arm Motor Drift - 0 6A: Test Left Leg Motor Drift - 0 6B:  Test Right Leg Motor Drift - 0 7: Test Limb Ataxia (FNF/Heel-Shin) - 0 8: Test Sensation -  0 9: Test Language/Aphasia - 0 10: Test Dysarthria - Severe Dysarthria: 0 11: Test Extinction/Inattention - Extinction to bilateral simultaneous stimulation 0   NIHSS Score: 0     Patient/Family was informed the Neurology Consult would occur via TeleHealth consult by way of interactive audio and video telecommunications and consented to receiving care in this manner.   Patient is being evaluated for possible acute neurologic impairment and high pretest probability of imminent or life-threatening deterioration. I spent total of 40 minutes providing care to this patient, including time for face to face visit via telemedicine, review of medical records, imaging studies and discussion of findings with providers, the patient and/or family.   Electronically signed: Dr. Caryl Pina

## 2023-02-06 NOTE — Telephone Encounter (Signed)
Initial Comment Caller states she was just eating lunch and she lost her vision in her right eye. It came back after a couple of minutes. This happened Sunday also. Her blood pressure is not elevated and she is not having any other symptoms. Translation No Nurse Assessment Nurse: Annye English, RN, Denise Date/Time (Eastern Time): 02/06/2023 1:37:45 PM Confirm and document reason for call. If symptomatic, describe symptoms. ---Caller states she was just eating lunch and she lost her vision in her left eye. It came back after a couple of minutes. This happened Sunday also. Her blood pressure is not elevated and she is not having any other symptoms. Does the patient have any new or worsening symptoms? ---Yes Will a triage be completed? ---Yes Related visit to physician within the last 2 weeks? ---No Does the PT have any chronic conditions? (i.e. diabetes, asthma, this includes High risk factors for pregnancy, etc.) ---No Is this a behavioral health or substance abuse call? ---No Guidelines Guideline Title Affirmed Question Affirmed Notes Nurse Date/Time (Eastern Time) Vision Loss or Change Complete loss of vision in one or both eyes Carmon, RN, Angelique Blonder 02/06/2023 1:38:21 PM Disp. Time Lamount Cohen Time) Disposition Final User 02/06/2023 1:34:42 PM Send to Urgent Viann Fish, Arline Asp PLEASE NOTE: All timestamps contained within this report are represented as [place] Standard Time. CONFIDENTIALTY NOTICE: This fax transmission is intended only for the addressee. It contains information that is legally privileged, confidential or otherwise protected from use or disclosure. If you are not the intended recipient, you are strictly prohibited from reviewing, disclosing, copying using or disseminating any of this information or taking any action in reliance on or regarding this information. If you have received this fax in error, please notify us immediately by telephone so that we can arrange for  its return to [place]. Phone: [phone], Toll-Free: [phone], Fax: [phone] Page: 2 of 2 Call Id: [id number] Disp. Time Lamount Cohen Time) Disposition Final User 02/06/2023 1:39:49 PM Go to ED Now Yes Annye English, RN, Angelique Blonder Final Disposition 02/06/2023 1:39:49 PM Go to ED Now Yes Carmon, RN, Leighton Ruff Disagree/Comply Comply Caller Understands No PreDisposition Call Doctor Care Advice Given Per Guideline GO TO ED NOW: ANOTHER ADULT SHOULD DRIVE: * It is better and safer if another adult drives instead of you. CARE ADVICE given per Vision Loss or Change (Adult) guideline. Referrals [facility] - ED

## 2023-02-06 NOTE — Telephone Encounter (Signed)
FYI: This call has been transferred to Access Nurse. Once the result note has been entered staff can address the message at that time.  Patient called in with the following symptoms:  Red Word: Loss of vision for 3 minutes   Please advise at [facility] [phone]  Message is routed to Provider Pool and [facility] Triage

## 2023-02-06 NOTE — ED Notes (Signed)
Report given to [employer]

## 2023-02-06 NOTE — Anesthesia Preprocedure Evaluation (Addendum)
Anesthesia Evaluation  Patient identified by MRN, date of birth, ID band Patient awake    Reviewed: Allergy & Precautions, H&P , NPO status , Patient's Chart, lab work & pertinent test results, reviewed documented beta blocker date and time   History of Anesthesia Complications (+) PONV and history of anesthetic complications  Airway Mallampati: II  TM Distance: >3 FB Neck ROM: Full    Dental no notable dental hx. (+) Teeth Intact, Dental Advisory Given   Pulmonary neg pulmonary ROS   Pulmonary exam normal breath sounds clear to auscultation       Cardiovascular hypertension, Pt. on medications and Pt. on home beta blockers + dysrhythmias Atrial Fibrillation  Rhythm:Regular Rate:Normal     Neuro/Psych CVA  negative psych ROS   GI/Hepatic Neg liver ROS,GERD  Medicated,,  Endo/Other  negative endocrine ROS    Renal/GU negative Renal ROS     Musculoskeletal  (+) Arthritis , Osteoarthritis,    Abdominal   Peds  Hematology  (+) Blood dyscrasia, anemia   Anesthesia Other Findings   Reproductive/Obstetrics negative OB ROS                             Anesthesia Physical Anesthesia Plan  ASA: 3 and emergent  Anesthesia Plan: General   Post-op Pain Management: Minimal or no pain anticipated   Induction: Intravenous  PONV Risk Score and Plan: 4 or greater and Ondansetron and Treatment may vary due to age or medical condition  Airway Management Planned: Oral ETT  Additional Equipment: Arterial line  Intra-op Plan:   Post-operative Plan: Extubation in OR  Informed Consent: I have reviewed the patients History and Physical, chart, labs and discussed the procedure including the risks, benefits and alternatives for the proposed anesthesia with the patient or authorized representative who has indicated his/her understanding and acceptance.     Dental advisory given  Plan Discussed with:  CRNA  Anesthesia Plan Comments:        Anesthesia Quick Evaluation

## 2023-02-06 NOTE — Telephone Encounter (Signed)
Pt in ED.  

## 2023-02-06 NOTE — Progress Notes (Addendum)
ANTICOAGULATION CONSULT NOTE - Initial Consult  Pharmacy Consult for Heparin Indication: stroke  Allergies  Allergen Reactions   Bee Venom Swelling    Bee stung her on the inside of her gum. Lips and gums started swelling. No SOB.   Levaquin [Levofloxacin] Hives and Itching   Peanut-Containing Drug Products Other (See Comments)    Stomach pain and diarrhea     Patient Measurements: Height: 4\' 11"  (149.9 cm) Weight: 83.5 kg (184 lb) IBW/kg (Calculated) : 43.2 Heparin Dosing Weight: 62.8 kg  Vital Signs: Temp: 98.1 F (36.7 C) (04/30 1441) Temp Source: Oral (04/30 1441) BP: 100/64 (04/30 1530) Pulse Rate: 76 (04/30 1530)  Labs: Recent Labs    02/05/23 0949 02/06/23 1532  HGB 16.3* 13.9  HCT 48.0* 44.4  PLT  --  244  CREATININE 0.80 0.88    Estimated Creatinine Clearance: 44.5 mL/min (by C-G formula based on SCr of 0.88 mg/dL).   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 patel;   2006  s/p left thyroidectomy w/ right inferior parathyoidectomy  (per path speciman marked  parathyroid but only thyroid tissue)   Hypertension    IDA (iron deficiency anemia)    hematology/ oncologist--- dr ennever/ sarah carter NP;  treated w/ IV iron infusions   Multinodular thyroid    Neuropathy    mild hands/ feet, uses cane   Nocturia more than twice per night    Nocturnal leg cramps    OA (osteoarthritis)    knees   OAB (overactive bladder)    Osteoporosis    PAF (paroxysmal atrial fibrillation) [facility])    cardiologist--- dr Jens Som   PONV (postoperative nausea and vomiting)    Vitamin D deficiency    Wears glasses     Medications:  (Not in a hospital admission)  Scheduled:  Infusions:  PRN:   Assessment: 87 yof with a history of AF on eliquis, GERD, HTN, previous breast cancer, HF, osteoporosis, previous cardioversion, and recent bladder scraping procedure yesterday. Patient is presenting with transient left eye complete vision loss . Heparin per pharmacy consult placed for  Left ICA occlusion  by ED MD upon consulting neurology.  Patient is on apixaban prior to arrival. Last dose 4/30 AM (held prior two days of doses for procedure). Will require aPTT monitoring due to likely falsely high anti-Xa level secondary to DOAC use.  Hgb 13.9; plt 244  Goal of Therapy:  Heparin level 0.3-0.5 units/ml aPTT 66-85 seconds Monitor platelets by anticoagulation protocol:  Yes   Plan:  No bolus stroke protocol heparin Start heparin infusion at 750 units/hr @ 2100 Check aPTT & anti-Xa level in 8 hours and daily while on heparin Continue to monitor via aPTT until levels are correlated Continue to monitor H&H and platelets  Delmar Landau, PharmD, BCPS 02/06/2023 6:08 PM ED Clinical Pharmacist -  [phone]  ADDENDUM 02/06/23 7:46 PM Neurology requesting heparin gtt to start now so start time changed. Shortly thereafter, IR stopped heparin gtt. Currently, heparin is not running. Pharmacy to monitor peripherally.  Delmar Landau, PharmD, BCPS 02/06/2023 7:47  PM ED Clinical Pharmacist -  [phone]

## 2023-02-06 NOTE — ED Provider Triage Note (Signed)
Emergency Medicine Provider Triage Evaluation Note  Loretta Gates , a 87 y.o. female  was evaluated in triage.  Pt complains of transient monocular vision loss of the left eye.  Occurred on Sunday and again today while eating lunch. Painless, everything went white with some areas of light at the side. Lasted about 3 minutes each time. Vision is back to normal now. No other symptoms. Denies numbness, weakness, difficulty talking or walking, current visual changes or facial droop.  No double vision. No headache, chest pain, nausea.   Review of Systems  Positive: Transient vision loss of left eye Negative: See above.  Physical Exam  BP 117/62 (BP Location: Left Arm)   Pulse 88   Temp 98.1 F (36.7 C) (Oral)   Resp 17   Ht 4\' 11"  (1.499 m)   Wt 83.5 kg   SpO2 97%   BMI 37.16 kg/m  Gen:   Awake, no distress   Resp:  Normal effort  Neurologic exam normal including normal CN II-XII, normal strength, sensation, finger to nose, gait with cane, normal visual fields. Normal pupils  Medical Decision Making  Medically screening exam initiated at 3:21 PM.  Appropriate orders placed.  CYTHNIA OSMUN was informed that the remainder of the evaluation will be completed by another provider, this initial triage assessment does not replace that evaluation, and the importance of remaining in the ED until their evaluation is complete.  Discussed concern for transient monocular vision loss may be due to embolic phenomena, "mini stroke" of eye, and need for further evaluation. Paged Neurology and Ophthalmology. Placing patient in room for further work up.    Alvira Monday, MD 02/06/23 1527

## 2023-02-06 NOTE — Progress Notes (Signed)
Telestroke RN Note:  Systems analyst - Bedside staff stated they needed a consult and that the pt was not a code stroke Re elerted at 1845 requesting for Dr. Otelia Limes to evaluate the pt at this time 63 - Dr. Otelia Limes messaged asking if he was aware and ready to eval the pt 1851 - Dr. Otelia Limes on camera  LNW 1814 mRS 0 NIH 0 On eliquis for afib - no TNK

## 2023-02-06 NOTE — Anesthesia Procedure Notes (Signed)
Arterial Line Insertion Start/End4/30/2024 8:42 PM, 02/06/2023 8:47 PM Performed by: Tressia Miners, CRNA, CRNA  Patient location: OR. Preanesthetic checklist: patient identified, IV checked, site marked, risks and benefits discussed, surgical consent, monitors and equipment checked, pre-op evaluation, timeout performed and anesthesia consent Left, radial was placed Catheter size: 20 G Hand hygiene performed , maximum sterile barriers used  and Seldinger technique used Allen's test indicative of satisfactory collateral circulation Attempts: 1 Procedure performed without using ultrasound guided technique. Following insertion, dressing applied and Biopatch. Post procedure assessment: normal and unchanged  Patient tolerated the procedure well with no immediate complications. Additional procedure comments: Confirmed with Dr. Corliss Skains and Dr. Sampson Goon okay to place arterial line in Left radial artery.

## 2023-02-06 NOTE — ED Notes (Signed)
Caryl Pina requested Code Stroke cart activation in order to be able to use the teleneurology cart, per [facility] EDP. Cart activated and staff attempting to assist.

## 2023-02-06 NOTE — ED Notes (Signed)
[facility] at bedside 

## 2023-02-06 NOTE — ED Notes (Signed)
ED Provider at bedside. 

## 2023-02-06 NOTE — ED Notes (Signed)
[facility] has departed with the patient.

## 2023-02-07 ENCOUNTER — Inpatient Hospital Stay (HOSPITAL_COMMUNITY): Payer: Medicare HMO

## 2023-02-07 ENCOUNTER — Inpatient Hospital Stay: Payer: Medicare HMO | Admitting: Family

## 2023-02-07 ENCOUNTER — Encounter (HOSPITAL_COMMUNITY): Payer: Self-pay | Admitting: Radiology

## 2023-02-07 ENCOUNTER — Inpatient Hospital Stay: Payer: Medicare HMO

## 2023-02-07 DIAGNOSIS — I6522 Occlusion and stenosis of left carotid artery: Secondary | ICD-10-CM

## 2023-02-07 DIAGNOSIS — I6602 Occlusion and stenosis of left middle cerebral artery: Secondary | ICD-10-CM | POA: Diagnosis present

## 2023-02-07 HISTORY — PX: IR INTRAVSC STENT CERV CAROTID W/O EMB-PROT MOD SED INC ANGIO: IMG2304

## 2023-02-07 HISTORY — PX: IR ANGIO INTRA EXTRACRAN SEL COM CAROTID INNOMINATE UNI R MOD SED: IMG5359

## 2023-02-07 HISTORY — PX: IR CT HEAD LTD: IMG2386

## 2023-02-07 HISTORY — PX: IR ANGIO VERTEBRAL SEL SUBCLAVIAN INNOMINATE UNI L MOD SED: IMG5364

## 2023-02-07 HISTORY — PX: IR PERCUTANEOUS ART THROMBECTOMY/INFUSION INTRACRANIAL INC DIAG ANGIO: IMG6087

## 2023-02-07 LAB — BASIC METABOLIC PANEL
Anion gap: 8 (ref 5–15)
BUN: 15 mg/dL (ref 8–23)
CO2: 22 mmol/L (ref 22–32)
Calcium: 8.4 mg/dL — ABNORMAL LOW (ref 8.9–10.3)
Chloride: 111 mmol/L (ref 98–111)
Creatinine, Ser: 0.72 mg/dL (ref 0.44–1.00)
GFR, Estimated: >60 mL/min (ref >60)
Glucose, Bld: 149 mg/dL — ABNORMAL HIGH (ref 70–99)
Potassium: 4.2 mmol/L (ref 3.5–5.1)
Sodium: 141 mmol/L (ref 135–145)

## 2023-02-07 LAB — URINALYSIS, ROUTINE W REFLEX MICROSCOPIC
Bilirubin Urine: NEGATIVE
Glucose, UA: >=500 mg/dL — AB
Ketones, ur: 5 mg/dL — AB
Leukocytes,Ua: NEGATIVE
Nitrite: NEGATIVE
Protein, ur: NEGATIVE mg/dL
Specific Gravity, Urine: >1.046 — ABNORMAL HIGH (ref 1.005–1.030)
pH: 6 (ref 5.0–8.0)

## 2023-02-07 LAB — LIPID PANEL
Cholesterol: 122 mg/dL (ref 0–200)
HDL: 46 mg/dL (ref >40)
LDL Cholesterol: 58 mg/dL (ref 0–99)
Total CHOL/HDL Ratio: 2.7 RATIO
Triglycerides: 92 mg/dL (ref <150)
VLDL: 18 mg/dL (ref 0–40)

## 2023-02-07 LAB — CBC WITH DIFFERENTIAL/PLATELET
Abs Immature Granulocytes: 0.06 10*3/uL (ref 0.00–0.07)
Basophils Absolute: 0 10*3/uL (ref 0.0–0.1)
Basophils Relative: 0 %
Eosinophils Absolute: 0 10*3/uL (ref 0.0–0.5)
Eosinophils Relative: 0 %
HCT: 43.8 % (ref 36.0–46.0)
Hemoglobin: 13.8 g/dL (ref 12.0–15.0)
Immature Granulocytes: 1 %
Lymphocytes Relative: 4 %
Lymphs Abs: 0.5 10*3/uL — ABNORMAL LOW (ref 0.7–4.0)
MCH: 27.5 pg (ref 26.0–34.0)
MCHC: 31.5 g/dL (ref 30.0–36.0)
MCV: 87.4 fL (ref 80.0–100.0)
Monocytes Absolute: 0.2 10*3/uL (ref 0.1–1.0)
Monocytes Relative: 1 %
Neutro Abs: 12.3 10*3/uL — ABNORMAL HIGH (ref 1.7–7.7)
Neutrophils Relative %: 94 %
Platelets: 224 10*3/uL (ref 150–400)
RBC: 5.01 MIL/uL (ref 3.87–5.11)
RDW: 16.1 % — ABNORMAL HIGH (ref 11.5–15.5)
WBC: 13 10*3/uL — ABNORMAL HIGH (ref 4.0–10.5)
nRBC: 0 % (ref 0.0–0.2)

## 2023-02-07 LAB — HEMOGLOBIN A1C
Hgb A1c MFr Bld: 6.2 % — ABNORMAL HIGH (ref 4.8–5.6)
Mean Plasma Glucose: 131.24 mg/dL

## 2023-02-07 LAB — RAPID URINE DRUG SCREEN, HOSP PERFORMED
Amphetamines: NOT DETECTED
Barbiturates: NOT DETECTED
Benzodiazepines: NOT DETECTED
Cocaine: NOT DETECTED
Opiates: NOT DETECTED
Tetrahydrocannabinol: NOT DETECTED

## 2023-02-07 LAB — SURGICAL PATHOLOGY

## 2023-02-07 LAB — MRSA NEXT GEN BY PCR, NASAL: MRSA by PCR Next Gen: NOT DETECTED

## 2023-02-07 MED ORDER — SODIUM CHLORIDE 0.9 % IV SOLN: INTRAVENOUS | Status: DC

## 2023-02-07 MED ORDER — ACETAMINOPHEN 160 MG/5ML PO SOLN
650.0000 mg | ORAL | Status: DC | PRN
  Administered 2023-02-10: 650 mg
  Filled 2023-02-07: qty 20.3

## 2023-02-07 MED ORDER — LORAZEPAM 2 MG/ML IJ SOLN: 1.0000 mg | Freq: Once | INTRAMUSCULAR | Status: DC

## 2023-02-07 MED ORDER — ORAL CARE MOUTH RINSE: 15.0000 mL | OROMUCOSAL | Status: DC | PRN

## 2023-02-07 MED ORDER — TICAGRELOR 90 MG PO TABS: 90.0000 mg | ORAL_TABLET | Freq: Two times a day (BID) | ORAL | Status: DC

## 2023-02-07 MED ORDER — TICAGRELOR 90 MG PO TABS
90.0000 mg | ORAL_TABLET | Freq: Two times a day (BID) | ORAL | Status: DC
  Administered 2023-02-07 (×2): 90 mg via ORAL
  Filled 2023-02-07 (×2): qty 1

## 2023-02-07 MED ORDER — SUGAMMADEX SODIUM 200 MG/2ML IV SOLN
INTRAVENOUS | Status: DC | PRN
  Administered 2023-02-07: 200 mg via INTRAVENOUS

## 2023-02-07 MED ORDER — CHLORHEXIDINE GLUCONATE CLOTH 2 % EX PADS
6.0000 | MEDICATED_PAD | Freq: Every day | CUTANEOUS | Status: DC
  Administered 2023-02-07 – 2023-02-10 (×4): 6 via TOPICAL

## 2023-02-07 MED ORDER — CLEVIDIPINE BUTYRATE 0.5 MG/ML IV EMUL: 0.0000 mg/h | INTRAVENOUS | Status: DC

## 2023-02-07 MED ORDER — ASPIRIN 81 MG PO CHEW
81.0000 mg | CHEWABLE_TABLET | Freq: Every day | ORAL | Status: DC
  Filled 2023-02-07: qty 1

## 2023-02-07 MED ORDER — ACETAMINOPHEN 650 MG RE SUPP: 650.0000 mg | RECTAL | Status: DC | PRN

## 2023-02-07 MED ORDER — LORAZEPAM 2 MG/ML IJ SOLN
1.0000 mg | Freq: Once | INTRAMUSCULAR | Status: AC
  Administered 2023-02-07: 2 mg via INTRAVENOUS
  Filled 2023-02-07: qty 1

## 2023-02-07 MED ORDER — PANTOPRAZOLE SODIUM 40 MG PO TBEC
40.0000 mg | DELAYED_RELEASE_TABLET | Freq: Every day | ORAL | Status: DC
  Administered 2023-02-08 – 2023-02-13 (×6): 40 mg via ORAL
  Filled 2023-02-07 (×7): qty 1

## 2023-02-07 MED ORDER — ACETAMINOPHEN 325 MG PO TABS
650.0000 mg | ORAL_TABLET | ORAL | Status: DC | PRN
  Administered 2023-02-12: 650 mg via ORAL
  Filled 2023-02-07 (×2): qty 2

## 2023-02-07 MED ORDER — ASPIRIN 81 MG PO CHEW
81.0000 mg | CHEWABLE_TABLET | Freq: Every day | ORAL | Status: DC
  Administered 2023-02-07 – 2023-02-13 (×7): 81 mg via ORAL
  Filled 2023-02-07 (×7): qty 1

## 2023-02-07 NOTE — Evaluation (Signed)
Physical Therapy Evaluation Patient Details Name: Loretta Gates MRN: [id number] DOB: 06/30/1936 Today's Date: 02/07/2023  History of Present Illness  Loretta Gates is a 87 y.o. female  who presented to ED with transient changes in vision. 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 s/p failed cardioversion 1 to 2 months ago), hypertension bladder cancer s/p TURBT (02/05/2023), heart failure, chronic venous insufficiency, left breast cancer s/p chemotherapy and mastectomy (2008), remote head/neck radiation (1940s at age 62), left thyroidectomy and right inferior parathyroidectomy, anemia, mild neuropathy   Clinical Impression  Pt admitted with above. Pt presenting with R inattention, impaired vision, R sided weakness, impaired balance, and requiring increased assist for ADLs and transfers. Pt was indep and just recently took a 9 day trip to [place] with dtr to see the solar eclipse without difficulty. Pt to greatly benefit from inpatient rehab program >3 hours a day for maximal functional recovery for safe transition home with spouse and daughter. Acute PT to cont to follow.       Recommendations for follow up therapy are one component of a multi-disciplinary discharge planning process, led by the attending physician.  Recommendations may be updated based on patient status, additional functional criteria and insurance authorization.  Follow Up Recommendations       Assistance Recommended at Discharge Frequent or constant Supervision/Assistance  Patient can return home with the following  A little help with walking and/or transfers;A little help with bathing/dressing/bathroom;Assist for transportation;Help with stairs or ramp for entrance    Equipment Recommendations  (TBD)  Recommendations for Other Services  Rehab consult    Functional Status Assessment Patient has had a recent decline in their functional status  and demonstrates the ability to make significant improvements in function in a reasonable and predictable amount of time.     Precautions / Restrictions Precautions Precautions: Fall Precaution Comments: decreased right visual and physical attention Restrictions Weight Bearing Restrictions: No      Mobility  Bed Mobility Overal bed mobility: Needs Assistance Bed Mobility: Supine to Sit, Sit to Supine     Supine to sit: Max assist, +2 for physical assistance Sit to supine: Total assist, +2 for physical assistance   General bed mobility comments: VC for hand placement and sequencing    Transfers Overall transfer level: Needs assistance Equipment used: 2 person hand held assist Transfers: Sit to/from Stand Sit to Stand: Min assist, +2 physical assistance, +2 safety/equipment, From elevated surface           General transfer comment: 2 person face to face technique.    Ambulation/Gait               General Gait Details: upon standing pt's HR increased to 156bpm and noted to be in afib. limited to 4 side steps to [facility], assist to move R LE  Stairs            Wheelchair Mobility    Modified Rankin (Stroke Patients Only)       Balance Overall balance assessment: Needs assistance Sitting-balance support: Feet supported, No upper extremity supported Sitting balance-Leahy Scale: Poor Sitting balance - Comments: sitting EOB Postural control: Right lateral lean (with onset of fatigue, s/p 6 min of sitting)   Standing balance-Leahy Scale: Poor Standing balance comment: standing at EOB with 2 person assist  Pertinent Vitals/Pain Pain Assessment Pain Assessment: Faces Faces Pain Scale: No hurt    Home Living Family/patient expects to be discharged to:: Private residence Living Arrangements: Spouse/significant other;Children (adult daughter) Available Help at Discharge: Family;Available 24 hours/day Type of Home:  House Home Access: Stairs to enter Entrance Stairs-Rails: Right;Left;Can reach both Entrance Stairs-Number of Steps: 4   Home Layout: Able to live on main level with bedroom/bathroom;Laundry or work area in [employer] Equipment: Shower seat;Cane - quad;Cane - single point;Wheelchair - Forensic psychologist (2 wheels);Crutches;Adaptive equipment;Grab bars - tub/shower      Prior Function Prior Level of Function : Independent/Modified Independent             Mobility Comments: SPC used during the day. Quad cane is used during the night. ADLs Comments: has sock aid, needs help with fastening bra     Hand Dominance   Dominant Hand: Left    Extremity/Trunk Assessment   Upper Extremity Assessment Upper Extremity Assessment: Defer to OT evaluation RUE Deficits / Details: Demonstrates generalized weakness including gross grasp. Able to raise UE to touch therapist's hand above pt's shoulder height. Slow motor movement. Impaired finger nose test. RUE Coordination: decreased fine motor;decreased gross motor LUE Deficits / Details: demonstrates generalized weakness. A/ROM appears WFL.    Lower Extremity Assessment Lower Extremity Assessment: Generalized weakness (pt unable to follow commands to complete MMT, able to stand without buckling)    Cervical / Trunk Assessment Cervical / Trunk Assessment: Other exceptions Cervical / Trunk Exceptions: body habitus  Communication   Communication: Expressive difficulties (slurred speech)  Cognition Arousal/Alertness: Lethargic Behavior During Therapy: Flat affect Overall Cognitive Status: Impaired/Different from baseline Area of Impairment: Attention, Memory, Following commands, Safety/judgement, Awareness, Problem solving                   Current Attention Level: Focused Memory: Decreased short-term memory Following Commands: Follows one step commands with increased time Safety/Judgement: Decreased awareness of deficits,  Decreased awareness of safety   Problem Solving: Slow processing, Requires tactile cues, Requires verbal cues General Comments: A&O x4, perseverated on "i feel so strange." educated given on artery occlusion and the effect on the brain, dtr present and appreciative        General Comments General comments (skin integrity, edema, etc.): HR elevated to 156 at the highest and noted to be in afib, returned pt to supine    Exercises     Assessment/Plan    PT Assessment Patient needs continued PT services  PT Problem List Decreased strength;Decreased range of motion;Decreased activity tolerance;Decreased balance;Decreased mobility;Decreased coordination;Decreased cognition;Decreased knowledge of use of DME;Decreased safety awareness;Impaired sensation       PT Treatment Interventions DME instruction;Gait training;Stair training;Therapeutic activities;Functional mobility training;Therapeutic exercise;Balance training;Neuromuscular re-education;Cognitive remediation;Patient/family education    PT Goals (Current goals can be found in the Care Plan section)  Acute Rehab PT Goals Patient Stated Goal: feel better PT Goal Formulation: With patient/family Time For Goal Achievement: 02/21/23 Potential to Achieve Goals: Good    Frequency Min 4X/week     Co-evaluation PT/OT/SLP Co-Evaluation/Treatment: Yes Reason for Co-Treatment: For patient/therapist safety;To address functional/ADL transfers PT goals addressed during session: Mobility/safety with mobility OT goals addressed during session: Strengthening/ROM;Other (comment) (vision and cognition)       AM-PAC PT "6 Clicks" Mobility  Outcome Measure Help needed turning from your back to your side while in a flat bed without using bedrails?: A Lot Help needed moving from lying on your back to sitting on the side of  a flat bed without using bedrails?: A Lot Help needed moving to and from a bed to a chair (including a wheelchair)?: A  Lot Help needed standing up from a chair using your arms (e.g., wheelchair or bedside chair)?: A Lot Help needed to walk in hospital room?: Total Help needed climbing 3-5 steps with a railing? : Total 6 Click Score: 10    End of Session Equipment Utilized During Treatment: Gait belt Activity Tolerance: Patient tolerated treatment well Patient left: in bed;with call bell/phone within reach;with bed alarm set;with family/visitor present Nurse Communication: Mobility status PT Visit Diagnosis: Unsteadiness on feet (R26.81);Muscle weakness (generalized) (M62.81);Other symptoms and signs involving the nervous system (R29.898)    Time: 1610-9604 PT Time Calculation (min) (ACUTE ONLY): 42 min   Charges:   PT Evaluation $PT Eval Moderate Complexity: 1 Mod PT Treatments $Therapeutic Activity: 8-22 mins        Lewis Shock, PT, DPT Acute Rehabilitation Services Secure chat preferred Office #: [phone]   Iona Hansen 02/07/2023, 2:55 PM

## 2023-02-07 NOTE — Progress Notes (Signed)
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  eICU Interventions  New Patient Evaluation.        Mykai Wendorf U Caryl Fate 02/07/2023, 3:02 AM

## 2023-02-07 NOTE — Evaluation (Signed)
Speech Language Pathology Evaluation Patient Details Name: Loretta Gates MRN: [id number] DOB: [birth year] Today's Date: 02/07/2023 Time: 4098-1191 SLP Time Calculation (min) (ACUTE ONLY): 20 min  Problem List:  Patient Active Problem List   Diagnosis Date Noted   Middle cerebral artery embolism, left 02/07/2023   ICAO (internal carotid artery occlusion), left 02/06/2023   Chest pain 12/08/2022   Class 3 obesity (HCC) 10/21/2022   Acute on chronic diastolic CHF (congestive heart failure) (HCC) 10/20/2022   IDA (iron deficiency anemia) 01/13/2022   Atrial fibrillation (HCC) 01/05/2022   Breast cancer (HCC) 01/05/2022   Primary hyperparathyroidism (HCC) 01/05/2022   Essential hypertension 01/05/2022   Age-related nuclear cataract of both eyes 12/11/2018   Abnormal INR 01/29/2018   Long term (current) use of anticoagulants 01/29/2018   Encounter for therapeutic drug monitoring 01/29/2018   Vitamin D deficiency 05/03/2017   S/P laparoscopic cholecystectomy 02/26/2017   Calculus of gallbladder without cholecystitis without obstruction 02/23/2017   Diarrhea of presumed infectious origin 02/23/2017   Hyperglycemia 02/23/2017   Right upper quadrant abdominal pain 02/23/2017   Lower extremity edema 01/18/2016   Age-related osteoporosis without current pathological fracture 01/17/2016   Diverticulosis of large intestine without hemorrhage 01/17/2016   GERD (gastroesophageal reflux disease) 01/17/2016   History of left breast cancer 01/17/2016   Multinodular goiter 01/17/2016   OAB (overactive bladder) 01/17/2016   History of cancer chemotherapy 03/06/2015   History of left mastectomy 03/06/2015   Malignant neoplasm of overlapping sites of left female breast (HCC) 03/06/2015   Past Medical History:  Past Medical History:  Diagnosis Date   Anemia    Bladder cancer [facility])    urologist--- dr pace   Chronic diastolic (congestive) heart failure (HCC)    followed by dr Jens Som   Chronic  venous insufficiency    Edema of both lower extremities    per pt wears compression hose   GERD (gastroesophageal reflux disease)    History of cancer chemotherapy    completed 2008 for left breast cancer   History of head and neck radiation 1946   per pt approx 1946 or 1947  (age 77) due to profound bilateral hearing loss told from scarlett fever/ measles,  once weekly for several weeks had  head/ neck radiation,  hearing was restored without neededing hearing aids   History of left breast cancer 2008   malignant 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 patel;   2006  s/p left thyroidectomy w/ right inferior parathyoidectomy  (per path speciman marked parathyroid but only thyroid tissue)   Hypertension    IDA (iron deficiency anemia)    hematology/ oncologist--- dr ennever/ sarah carter NP;  treated w/ IV iron infusions   Multinodular thyroid    Neuropathy    mild hands/ feet, uses cane   Nocturia more than twice per night    Nocturnal leg cramps    OA (osteoarthritis)    knees   OAB (overactive bladder)    Osteoporosis    PAF (paroxysmal atrial fibrillation) [facility])    cardiologist--- dr Jens Som   PONV (postoperative nausea and vomiting)    Vitamin D deficiency    Wears glasses    Past Surgical History:  Past Surgical History:  Procedure Laterality Date   BREAST LUMPECTOMY WITH RADIOACTIVE SEED LOCALIZATION Right 10/25/2021  Procedure: RIGHT BREAST LUMPECTOMY WITH RADIOACTIVE SEED LOCALIZATION;  Surgeon: Harriette Bouillon, MD;  Location: [facility];  Service: General;  Laterality: Right;   CARDIOVERSION N/A 01/01/2023   Procedure: CARDIOVERSION;  Surgeon: [name], MD;  Location: [facility] END[facility]COPY;  Service: Cardiovascular;  Laterality: N/A;   CHOLECYSTECTOMY, LAPAROSCOPIC   02/25/2017   @[facility]    COLONOSCOPY WITH ESOPHAGOGASTRODUODENOSCOPY (EGD)  2022   MASTECTOMY WITH AXILLARY LYMPH NODE DISSECTION Left 03/05/2007   @ [facility]   OVARIAN CYST SURGERY     age 64;   abdominal   RADIOLOGY WITH ANESTHESIA N/A 02/06/2023   Procedure: IR WITH ANESTHESIA;  Surgeon: Radiologist, Medication, MD;  Location: [facility] OR;  Service: Radiology;  Laterality: N/A;   THYROIDECTOMY, PARTIAL Left 2006   left lobectomy and right infertior parathyroidectomy   TONSILLECTOMY     child   HPI:  Loretta Gates is a 87 y.o. female with a past medical history significant for atrial fibrillation on Eliquis (last dose 4/30 a.m. s/p failed cardioversion 1 to 2 months ago), hypertension bladder cancer s/p TURBT (02/05/2023), heart failure, chronic venous insufficiency, left breast cancer s/p chemotherapy and mastectomy (2008), remote head/neck radiation (1940s at age 66), left thyroidectomy and right inferior parathyroidectomy, anemia, mild neuropathy. Prior to admission, she had several episodes of transient vision loss with rapid return to baseline. Due to recurrent symptoms at [facility] she was emergently evaluated by video neurology and decision was made to emergently assess her left carotid occlusion with potential for stent placement due to concern for stump embolization versus collateral failure versus acute occlusion. s/p Status post left common carotid arteriogram right common carotid arteriogram and right vertebral arteriogram 5/1.   Assessment / Plan / Recommendation Clinical Impression  Cognitive linguistic evaluation complete. Patient lethargic following PT/OT and swallow evaluation. Deficits in the areas of orientation, awareness, attention (including right sided attention), speech intelligibility, and problem solving noted. Patient will benefit from ongoing SLP f/u as well as AIR when medically stable.    SLP Assessment  SLP Recommendation/Assessment: Patient needs continued Speech  [place] [facility] Services SLP Visit Diagnosis: Dysphagia, oropharyngeal phase (R13.12);Dysarthria and anarthria (R47.1);Cognitive communication deficit (R41.841)    Recommendations for follow up therapy are one component of a multi-disciplinary discharge planning process, led by the attending physician.  Recommendations may be updated based on patient status, additional functional criteria and insurance authorization.    Follow Up Recommendations  Acute inpatient rehab (3hours/day)       Functional Status Assessment Patient has had a recent decline in their functional status and demonstrates the ability to make significant improvements in function in a reasonable and predictable amount of time.  Frequency and Duration min 2x/week  2 weeks      SLP Evaluation Cognition  Overall Cognitive Status: Impaired/Different from baseline Arousal/Alertness: Lethargic Orientation Level: Oriented to person;Oriented to place;Disoriented to time;Disoriented to situation Attention: Sustained Sustained Attention: Impaired Sustained Attention Impairment: Verbal complex;Functional complex Memory: Impaired Memory Impairment: Retrieval deficit Awareness: Impaired Awareness Impairment: Intellectual impairment Problem Solving: Impaired Problem Solving Impairment: Verbal complex       Comprehension  Auditory Comprehension Overall Auditory Comprehension: Appears within functional limits for tasks assessed (for basic multi step commands) Visual Recognition/Discrimination Discrimination: Not tested Reading Comprehension Reading Status: Not tested    Expression Expression Primary Mode of Expression: Verbal Verbal Expression Overall Verbal Expression: Appears within functional limits for tasks assessed Written Expression Dominant Hand: Left   Oral / Motor  Oral Motor/Sensory Function Overall Oral  Motor/Sensory Function: Moderate impairment Facial ROM: Reduced right;Suspected CN VII (facial)  dysfunction Facial Symmetry: Abnormal symmetry right;Suspected CN VII (facial) dysfunction Facial Strength: Reduced right;Suspected CN VII (facial) dysfunction Facial Sensation: Within Functional Limits Lingual ROM: Reduced right;Suspected CN XII (hypoglossal) dysfunction Lingual Symmetry: Abnormal symmetry right;Suspected CN XII (hypoglossal) dysfunction Lingual Strength: Reduced;Suspected CN XII (hypoglossal) dysfunction Mandible: Within Functional Limits Motor Speech Overall Motor Speech: Impaired Respiration: Impaired Level of Impairment: Word Phonation: Low vocal intensity;Breathy (intermittently breathy) Resonance: Within functional limits Articulation: Impaired Level of Impairment: Word Intelligibility: Intelligibility reduced Word: 50-74% accurate Phrase: 50-74% accurate Sentence: 50-74% accurate Conversation: 50-74% accurate Motor Planning: Witnin functional limits Effective Techniques: Increased vocal intensity           Ferdinand Lango MA, CCC-SLP  Jahmad Petrich Meryl 02/07/2023, 12:26 PM

## 2023-02-07 NOTE — Anesthesia Postprocedure Evaluation (Signed)
Anesthesia Post Note  Patient: Loretta Gates  Procedure(s) Performed: IR WITH ANESTHESIA     Patient location during evaluation: ICU Anesthesia Type: General Level of consciousness: awake and confused Pain management: pain level controlled Vital Signs Assessment: post-procedure vital signs reviewed and stable Respiratory status: spontaneous breathing, nonlabored ventilation, respiratory function stable and patient connected to face mask oxygen Cardiovascular status: blood pressure returned to baseline and stable Postop Assessment: no apparent nausea or vomiting Anesthetic complications: no  No notable events documented.  Last Vitals:  Vitals:   02/07/23 0104 02/07/23 0105  BP:  100/64  Pulse: (!) 126 90  Resp:  19  Temp:  (!) 36.2 C  SpO2: 90% 96%    Last Pain:  Vitals:   02/07/23 0105  TempSrc: Axillary  PainSc:                  Yasmin Bronaugh,W. EDMOND

## 2023-02-07 NOTE — Progress Notes (Signed)
Inpatient Rehab Admissions Coordinator:  ? ?Per therapy recommendations,  patient was screened for CIR candidacy by Fard Borunda, MS, CCC-SLP. At this time, Pt. Appears to be a a potential candidate for CIR. I will place   order for rehab consult per protocol for full assessment. Please contact me any with questions. ? ?Kelce Bouton, MS, CCC-SLP ?Rehab Admissions Coordinator  ?[phone] (celll) ?[phone] (office) ? ?

## 2023-02-07 NOTE — Procedures (Addendum)
INR.  Status post left common carotid arteriogram right common carotid arteriogram and right vertebral arteriogram.  Right CFA approach.  Findings.  Occluded internal carotid artery from the bulb to the paraophthalmic region.  No occlusive thrombus in the inferior division distal M2 segment.  Status post complete revascularization of the left internal carotid artery with 1 pass with complex aspiration, and a 4 mm x 40 mm Solitaire X retrieval device.  Status post near complete revascularization of the M2 segment of the inferior division, with 1 pass with a 4 mm x 30 mm Solitaire X scleral device i.e. complex aspiration, and 1 pass with a 38 Socrates aspiration catheter achieving aTICI 2C revascularization.  Status post stent assisted angioplasty of symptomatic atherosclerotic stenosis of the proximal left ICA with proximal flow arrest.  8French Angio-Seal closure device deployed for hemostasis at the right groin puncture site.  Distal pulses all dopplerable in  both feet unchanged.  Post CT of the brain demonstrates mild Lt perisylvian hyperattenuation ?SAH Extubated  Pupils 3 nmm RT = LT  moves RT LE spontaneously. Withdraws RT UE intermittently.Not responsive to simple commands.  Meds  Cangrelor  quarter bolus dose and quarter dose  infusion  . Cangrelor stopped at 12.55 am

## 2023-02-07 NOTE — Progress Notes (Addendum)
Patient remains very sleepy since 2 mg Ativan for MRI Head CT to rule out any acute process NGT if unable to wake enough to swallow ticagrelor    Addendum: After repeat HCT awake enough to take ticag PO per nursing  Head CT personally reviewed and discussed with radiology: The area of parenchymal hemorrhage is new compared to the prior CT but was present on the interval MRI and appears stable from that exam.  Addendum   RVR to 180s at ~1:15 AM, resolved with IV metoprolol 5 mg  Nursing notes somewhat low urine output, 500 cc bolus at 50 cc/hr ordered  Patient slightly delirious but still strong on the left, slightly weak on the right, stating she wants to wash her hands and get out of bed. Redirectable.   Home dose of metoprolol confirmed with family at bedside, 75 mg BID reordered   Brooke Dare MD-PhD [facility] [phone] Available 7 PM to 7 AM, outside of these hours please call Neurologist on call as listed on Amion.   30 min critical care time

## 2023-02-07 NOTE — Transfer of Care (Signed)
Immediate Anesthesia Transfer of Care Note  Patient: Loretta Gates  Procedure(s) Performed: IR WITH ANESTHESIA  Patient Location: ICU  Anesthesia Type:General  Level of Consciousness: drowsy  Airway & Oxygen Therapy: Patient Spontanous Breathing and Patient connected to face mask oxygen  Post-op Assessment: Report given to RN, Post -op Vital signs reviewed and stable, and Patient moving all extremities X 4  Post vital signs: Reviewed and stable  Last Vitals:  Vitals Value Taken Time  BP 126/69 02/07/23 0055  Temp 36.2 C 02/07/23 0055  Pulse 107 02/07/23 0055  Resp 17 02/07/23 0055  SpO2 97 % 02/07/23 0055  Vitals shown include unvalidated device data.  Last Pain:  Vitals:   02/07/23 0105  TempSrc: Axillary  PainSc:          Complications: No notable events documented.

## 2023-02-07 NOTE — Evaluation (Signed)
Clinical/Bedside Swallow Evaluation Patient Details  Name: Loretta Gates MRN: [id number] Date of Birth: 1935/10/16  Today's Date: 02/07/2023 Time: SLP Start Time (ACUTE ONLY): 1120 SLP Stop Time (ACUTE ONLY): 1135 SLP Time Calculation (min) (ACUTE ONLY): 15 min  Past Medical History:  Past Medical History:  Diagnosis Date   Anemia    Bladder cancer [facility])    urologist--- dr pace   Chronic diastolic (congestive) heart failure (HCC)    followed by dr Jens Som   Chronic venous insufficiency    Edema of both lower extremities    per pt wears compression hose   GERD (gastroesophageal reflux disease)    History of cancer chemotherapy    completed 2008 for left breast cancer   History of head and neck radiation 1946   per pt approx 1946 or 17  (age 89) due to 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 patel;   2006  s/p left thyroidectomy w/ right inferior parathyoidectomy  (per path speciman marked parathyroid but only thyroid tissue)   Hypertension    IDA (iron deficiency anemia)    hematology/ oncologist--- dr ennever/ sarah carter NP;  treated w/ IV iron infusions   Multinodular thyroid    Neuropathy    mild hands/ feet, uses cane   Nocturia more than twice per night    Nocturnal leg cramps    OA (osteoarthritis)    knees   OAB (overactive bladder)    Osteoporosis    PAF (paroxysmal atrial fibrillation) [facility])    cardiologist--- dr Jens Som   PONV (postoperative nausea and vomiting)    Vitamin D deficiency    Wears glasses    Past Surgical History:  Past Surgical  History:  Procedure Laterality Date   BREAST LUMPECTOMY WITH RADIOACTIVE SEED LOCALIZATION Right 10/25/2021   Procedure: RIGHT BREAST LUMPECTOMY WITH RADIOACTIVE SEED LOCALIZATION;  Surgeon: Harriette Bouillon, MD;  Location: [facility];  Service: General;  Laterality: Right;   CARDIOVERSION N/A 01/01/2023   Procedure: CARDIOVERSION;  Surgeon: [name], MD;  Location: [facility] END[facility]COPY;  Service: Cardiovascular;  Laterality: N/A;   CHOLECYSTECTOMY, LAPAROSCOPIC  02/25/2017   @[facility]    COLONOSCOPY WITH ESOPHAGOGASTRODUODENOSCOPY (EGD)  2022   MASTECTOMY WITH AXILLARY LYMPH NODE DISSECTION Left 03/05/2007   @ [facility]   OVARIAN CYST SURGERY     age 14;   abdominal   RADIOLOGY WITH ANESTHESIA N/A 02/06/2023   Procedure: IR WITH ANESTHESIA;  Surgeon: Radiologist, Medication, MD;  Location: [facility] OR;  Service: Radiology;  Laterality: N/A;   THYROIDECTOMY, PARTIAL Left 2006   left lobectomy and right infertior parathyroidectomy   TONSILLECTOMY     child   HPI:  SHAWNTAY Gates is a 87 y.o. female with a past medical history significant for atrial fibrillation on Eliquis (last dose 4/30 a.m. s/p failed cardioversion 1 to 2 months ago), hypertension bladder cancer s/p TURBT (02/05/2023), heart failure, chronic venous insufficiency, left breast cancer s/p chemotherapy and mastectomy (2008), remote head/neck radiation (1940s at age 26), left thyroidectomy and right inferior parathyroidectomy, anemia, mild neuropathy. Prior to admission, she had  several episodes of transient vision loss with rapid return to baseline. Due to recurrent symptoms at [facility] she was emergently evaluated by video neurology and decision was made to emergently assess her left carotid occlusion with potential for stent placement due to concern for stump embolization versus collateral failure versus acute occlusion. s/p Status post left common carotid arteriogram right common carotid arteriogram and right vertebral  arteriogram 5/1.    Assessment / Plan / Recommendation  Clinical Impression  Patient presents with a moderate oropharyngeal dysphagia. Orally, she has CN VII and XII dysfunction wtih resultant right sided labial and lingual weakness. Intermittent anterior labial spillage noted primarily with pureed solids with intact awareness. She was able to orally contain thin liquids and initiate a swallow consistently without overt s/s of aspiration when consuming via small cup sips. With straw however she demonstrated an immediate cough post swallow indicative of decreased airway protection. Observable decrease in oral control with a straw likely resulting in premature spillage of bolus and delay in swallow initiation. Discussed performance with daughter. Recommend dysphagia 1 solids (due to combination of mastication deficits and lethargy) and thin liquids via straw. If s/s of aspiration occur, will plan for instrumental testing next date. Otherwise will f/u at bedside 5/2. SLP Visit Diagnosis: Dysphagia, oropharyngeal phase (R13.12)    Aspiration Risk  Moderate aspiration risk    Diet Recommendation Dysphagia 1 (Puree);Thin liquid   Liquid Administration via: No straw;Cup Medication Administration: Whole meds with puree Supervision: Staff to assist with self feeding;Full supervision/cueing for compensatory strategies Compensations: Slow rate;Small sips/bites;Lingual sweep for clearance of pocketing;Monitor for anterior loss Postural Changes: Seated upright at 90 degrees    Other  Recommendations Oral Care Recommendations: Oral care BID    Recommendations for follow up therapy are one component of a multi-disciplinary discharge planning process, led by the attending physician.  Recommendations may be updated based on patient status, additional functional criteria and insurance authorization.  Follow up Recommendations Acute inpatient rehab (3hours/day)      Assistance Recommended at Discharge     Functional Status Assessment Patient has had a recent decline in their functional status and demonstrates the ability to make significant improvements in function in a reasonable and predictable amount of time.  Frequency and Duration min 2x/week  2 weeks       Prognosis Prognosis for improved oropharyngeal function: Good      Swallow Study   General HPI: JERICKA KADAR is a 87 y.o. female with a past medical history significant for atrial fibrillation on Eliquis (last dose 4/30 a.m. s/p failed cardioversion 1 to 2 months ago), hypertension bladder cancer s/p TURBT (02/05/2023), heart failure, chronic venous insufficiency, left breast cancer s/p chemotherapy and mastectomy (2008), remote head/neck radiation (1940s at age 37), left thyroidectomy and right inferior parathyroidectomy, anemia, mild neuropathy. Prior to admission, she had several episodes of transient vision loss with rapid return to baseline. Due to recurrent symptoms at [facility] she was emergently evaluated by video neurology and decision was made to emergently assess her left carotid occlusion with potential for stent placement due to concern for stump embolization versus collateral failure versus acute occlusion. s/p Status post left common carotid arteriogram right common carotid arteriogram and right vertebral arteriogram 5/1. Type of Study: Bedside Swallow Evaluation Previous Swallow Assessment: none Diet Prior to this Study: NPO Temperature Spikes Noted: No Respiratory Status: Room air History of Recent Intubation: No Behavior/Cognition: Cooperative;Pleasant mood;Lethargic/Drowsy;Requires cueing Oral Cavity Assessment: Within Functional Limits Oral Care  Completed by SLP: No Oral Cavity - Dentition: Adequate natural dentition Vision: Functional for self-feeding Self-Feeding Abilities: Able to feed self;Needs assist Patient Positioning: Upright in bed Baseline Vocal Quality: Low vocal intensity Volitional  Cough: Weak Volitional Swallow: Able to elicit    Oral/Motor/Sensory Function Overall Oral Motor/Sensory Function: Moderate impairment Facial ROM: Reduced right;Suspected CN VII (facial) dysfunction Facial Symmetry: Abnormal symmetry right;Suspected CN VII (facial) dysfunction Facial Strength: Reduced right;Suspected CN VII (facial) dysfunction Facial Sensation: Within Functional Limits Lingual ROM: Reduced right;Suspected CN XII (hypoglossal) dysfunction Lingual Symmetry: Abnormal symmetry right;Suspected CN XII (hypoglossal) dysfunction Lingual Strength: Reduced;Suspected CN XII (hypoglossal) dysfunction Mandible: Within Functional Limits   Ice Chips Ice chips: Not tested   Thin Liquid Thin Liquid: Impaired Presentation: Straw;Self Fed Pharyngeal  Phase Impairments: Cough - Immediate    Nectar Thick Nectar Thick Liquid: Not tested   Honey Thick Honey Thick Liquid: Not tested   Puree Puree: Impaired Presentation: Spoon Oral Phase Impairments: Reduced labial seal Oral Phase Functional Implications: Right anterior spillage   Solid     Solid: Impaired Presentation: Self Fed Oral Phase Impairments: Impaired mastication Oral Phase Functional Implications: Prolonged oral transit;Impaired mastication;Oral residue     [employer] MA, CCC-SLP  Lateef Juncaj Meryl 02/07/2023,12:21 PM

## 2023-02-07 NOTE — Progress Notes (Addendum)
Exam:  Able to state her name and her age as well as the month.  Eyes only cross slightly past midline to the right.  Right hemianopia to blink to threat.  Orienting to fingers on the left side but not reporting the number of fingers correctly.  Naming thumb as right hand.  Slightly drift in the right upper extremity.  No drift in the lower extremities.  Reactive to touch throughout  Head CT personally reviewed, agree with radiology:   Acute subarachnoid hemorrhage in the left Sylvian fissure and over the left convexity. Unchanged compared to the intraprocedural CT obtained during the earlier procedure.  Reviewed with family at bedside, feel her exam is consistent with her subarachnoid hemorrhage, cannot exclude underlying stroke at this time, MRI will provide more information when obtained  20 minutes of critical care, billing per Dr. Roda Shutters

## 2023-02-07 NOTE — Progress Notes (Addendum)
Referring Physician(s): Code Stroke   Supervising Physician: Julieanne Cotton  Patient Status:  [facility] - In-pt  Chief Complaint: Transient left eye vision loss; Left ICA occlusion s/p revascularization via thrombectomy and stent-assisted angioplasty 02/07/23 with Dr. Corliss Skains   Subjective: Patient awake/alert in bed. Husband and daughter at the bedside. Right facial droop, mild dysarthria. Patient oriented x 3. No apparent discomfort or distress observed.   Allergies: Bee venom, Levaquin [levofloxacin], and Peanut-containing drug products  Medications: Prior to Admission medications   Medication Sig Start Date End Date Taking? Authorizing Provider  apixaban (ELIQUIS) 5 MG TABS tablet TAKE ONE TABLET BY MOUTH TWICE A DAY 12/14/22  Yes Copland, Gwenlyn Found, MD  cholestyramine (QUESTRAN) 4 GM/DOSE powder Take 1 packet (4 g total) by mouth See admin instructions. Mix 1 teaspoon in water and take at 6pm daily with warfarin. Patient taking differently: Take 4 g by mouth See admin instructions. Mix 1 teaspoon in water and take daily 11/06/22  Yes Copland, Gwenlyn Found, MD  cinacalcet (SENSIPAR) 30 MG tablet Take 30 mg by mouth daily with breakfast.   Yes [provider]  denosumab (PROLIA) 60 MG/ML SOSY injection Inject 60 mg into the skin every 6 (six) months.   Yes [provider]  empagliflozin (JARDIANCE) 10 MG TABS tablet TAKE ONE TABLET BY MOUTH ONE TIME DAILY Patient taking differently: Take 10 mg by mouth daily. 12/14/22  Yes Copland, Gwenlyn Found, MD  ergocalciferol (VITAMIN D2) 1.25 MG (50000 UT) capsule Take 50,000 Units by mouth every 14 (fourteen) days. Every other saturday   Yes [provider]  furosemide (LASIX) 20 MG tablet Alternate taking 20 mg po daily with taking 30 mg po daily ever other day Patient taking differently: Take 20 mg by mouth as directed. Alternate taking 20 mg po daily with taking 30 mg po daily ever other day 01/10/23  Yes Copland, Gwenlyn Found, MD   irbesartan (AVAPRO) 300 MG tablet Take 1 tablet (300 mg total) by mouth daily. Patient taking differently: Take 300 mg by mouth daily. 05/29/22  Yes Copland, Gwenlyn Found, MD  Magnesium Oxide -Mg Supplement 500 MG TABS Take 500 mg by mouth daily.   Yes [provider]  metoprolol tartrate (LOPRESSOR) 50 MG tablet Take 1.5 tablets (75 mg total) by mouth 2 (two) times daily. 11/20/22  Yes Copland, Gwenlyn Found, MD  omeprazole (PRILOSEC) 40 MG capsule Take 1 capsule (40 mg total) by mouth 2 (two) times daily before lunch and supper. Patient taking differently: Take 40 mg by mouth in the morning and at bedtime. 12/25/22  Yes Copland, Gwenlyn Found, MD  oxybutynin (DITROPAN XL) 15 MG 24 hr tablet Take 1 tablet (15 mg total) by mouth at bedtime. Patient taking differently: Take 15 mg by mouth at bedtime. 05/05/22  Yes Copland, Gwenlyn Found, MD  traMADol (ULTRAM) 50 MG tablet Take 1 tablet (50 mg total) by mouth every 6 (six) hours as needed. Patient not taking: Reported on 02/06/2023 02/05/23 02/05/24  Noel Christmas, MD     Vital Signs: BP (!) 105/54   Pulse 74   Temp 97.7 F (36.5 C) (Axillary)   Resp 19   Ht 4\' 11"  (1.499 m)   Wt 184 lb (83.5 kg)   SpO2 93%   BMI 37.16 kg/m   Physical Exam Constitutional:      General: She is not in acute distress.    Appearance: She is not ill-appearing.  Eyes:     General: Visual field  deficit present.  Cardiovascular:     Rate and Rhythm: Rhythm irregular.     Comments: Atrial fibrillation. Right groin vascular site is clean, soft, dry and non-tender.  Pulmonary:     Effort: Pulmonary effort is normal.  Genitourinary:    Comments: Foley with clear yellow urine.  Skin:    General: Skin is warm and dry.  Neurological:     Mental Status: She is alert.     Cranial Nerves: Dysarthria and facial asymmetry present.     Comments: Mild dysarthria. Mild right arm drift. Alert and oriented to person, place, time and situation.      Imaging: CT HEAD WO  CONTRAST ( )  Result Date: 02/07/2023 CLINICAL DATA:  Stroke follow-up EXAM: CT HEAD WITHOUT CONTRAST TECHNIQUE: Contiguous axial images were obtained from the base of the skull through the vertex without intravenous contrast. RADIATION DOSE REDUCTION: This exam was performed according to the departmental dose-optimization program which includes automated exposure control, adjustment of the mA and/or kV according to patient size and/or use of iterative reconstruction technique. COMPARISON:  Angiography 02/06/2023 FINDINGS: Brain: There is acute subarachnoid hemorrhage in the left sylvian fissure and over the left convexity. There is periventricular hypoattenuation compatible with chronic microvascular disease. Vascular: No abnormal hyperdensity of the major intracranial arteries or dural venous sinuses. No intracranial atherosclerosis. Skull: The visualized skull base, calvarium and extracranial soft tissues are normal. Sinuses/Orbits: No fluid levels or advanced mucosal thickening of the visualized paranasal sinuses. No mastoid or middle ear effusion. The orbits are normal. IMPRESSION: Acute subarachnoid hemorrhage in the left Sylvian fissure and over the left convexity. Unchanged compared to the intraprocedural CT obtained during the earlier procedure. Electronically Signed   By: Deatra Robinson M.D.   On: 02/07/2023 02:11   CT ANGIO HEAD NECK W WO CM  Result Date: 02/06/2023 CLINICAL DATA:  Transient monocular vision loss in the left RI EXAM: CT ANGIOGRAPHY HEAD AND NECK WITH AND WITHOUT CONTRAST TECHNIQUE: Multidetector CT imaging of the head and neck was performed using the standard protocol during bolus administration of intravenous contrast. Multiplanar CT image reconstructions and MIPs were obtained to evaluate the vascular anatomy. Carotid stenosis measurements (when applicable) are obtained utilizing NASCET criteria, using the distal internal carotid diameter as the denominator. RADIATION DOSE  REDUCTION: This exam was performed according to the departmental dose-optimization program which includes automated exposure control, adjustment of the mA and/or kV according to patient size and/or use of iterative reconstruction technique. CONTRAST:  75mL OMNIPAQUE IOHEXOL 350 MG/ML SOLN COMPARISON:  None Available. FINDINGS: CT HEAD FINDINGS Brain: No evidence of acute infarct, hemorrhage, mass, mass effect, or midline shift. No hydrocephalus or extra-axial fluid collection. Periventricular white matter changes, likely the sequela of chronic small vessel ischemic disease. Vascular: No hyperdense vessel. Skull: Negative for fracture or focal lesion. Sinuses/Orbits: No acute finding. Other: The mastoid air cells are well aerated. CTA NECK FINDINGS Aortic arch: Standard branching. Imaged portion shows no evidence of aneurysm or dissection. No significant stenosis of the major arch vessel origins. Mild aortic atherosclerosis. Right carotid system: No evidence of dissection, occlusion, or hemodynamically significant stenosis (greater than 50%). Retropharyngeal course of the mid right ICA Left carotid system: Occlusion of the left ICA just distal to its take-off (series 12, image 117 and series 11, image 116). No other significant stenosis in the opacified portion of the left carotid. No evidence of dissection. Vertebral arteries: No evidence of dissection, occlusion, or hemodynamically significant stenosis (greater than 50%). Skeleton: No  acute osseous abnormality. Degenerative changes in the cervical spine. Other neck: Negative. Upper chest: 3 mm subsolid nodule in the right upper lobe (series 11, image 34). Emphysema. Review of the MIP images confirms the above findings CTA HEAD FINDINGS Anterior circulation: Nonopacification of the left ICA until the distal cavernous segment, where there is minimal opacification, with more robust opacification in the supraclinoid segment, likely retrograde. There is opacification  the ophthalmic artery. The right internal carotid artery is patent to the terminus without significant stenosis. A1 segments patent, hypoplastic on the left. Normal anterior communicating artery. Anterior cerebral arteries are patent to their distal aspects without significant stenosis. No M1 stenosis or occlusion. MCA branches perfused to their distal aspects without significant stenosis. Posterior circulation: Vertebral arteries patent to the vertebrobasilar junction without significant stenosis. Posterior inferior cerebellar arteries patent proximally. Basilar patent to its distal aspect without significant stenosis. Superior cerebellar arteries patent proximally. Patent left P1. Fetal origin of the right PCA from the right posterior communicating artery. PCAs perfused to their distal aspects without significant stenosis. The bilateral posterior communicating arteries are patent. Venous sinuses: As permitted by contrast timing, patent. Anatomic variants: Fetal origin of the right PCA. Review of the MIP images confirms the above findings IMPRESSION: 1. 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 No other intracranial large vessel occlusion or significant stenosis. 3. No hemodynamically significant stenosis in the neck. 4. 3 mm subsolid nodule in the right upper lobe. No follow-up needed if patient is low-risk.This recommendation follows the consensus statement: Guidelines for Management of Incidental Pulmonary Nodules Detected on CT Images: From the Fleischner Society 2017; Radiology 2017; 284:228-243. 5. Aortic atherosclerosis. Aortic Atherosclerosis (ICD10-I70.0). These results were called by telephone at the time of interpretation on 02/06/2023 at 5:23 pm to provider [facility] , who verbally  acknowledged these results. Electronically Signed   By: Wiliam Ke M.D.   On: 02/06/2023 17:24    Labs:  CBC: Recent Labs    12/01/22 1625 12/22/22 1308 02/05/23 0949 02/06/23 1532 02/07/23 0128  WBC 10.4 9.2  --  13.6* 13.0*  HGB 13.5 15.0 16.3* 13.9 13.8  HCT 42.2 46.6 48.0* 44.4 43.8  PLT 305 292  --  244 224    COAGS: Recent Labs    10/20/22 0959 10/21/22 0016 10/22/22 0026 10/22/22 1213  INR 3.2* 3.0* 2.4* 1.7*    BMP: Recent Labs    10/22/22 0026 10/23/22 0018 10/25/22 1028 12/01/22 1625 12/22/22 1308 02/05/23 0949 02/06/23 1532 02/07/23 0128  NA 139 139   < > 141 141 142 138 141  K 3.6 4.3   < > 4.4 4.7 4.3 4.1 4.2  CL 105 106   < > 105 104 102 105 111  CO2 27 26   < > 26 21  --  26 22  GLUCOSE 118* 125*   < > 103* 94 111* 100* 149*  BUN 18 21   < > 20 19 25* 24* 15  CALCIUM 10.2 10.6*   < > 11.3* 11.3*  --  8.9 8.4*  CREATININE 0.95 1.08*   < > 0.87 0.81 0.80 0.88 0.72  GFRNONAA 59* 51*  --   --   --   --  >60 >60   < > = values in this interval not displayed.    LIVER FUNCTION  TESTS: Recent Labs    03/10/22 1011 07/12/22 1141 10/20/22 0959 12/01/22 1625 02/06/23 1532  BILITOT 0.5 0.7 0.9 0.9 0.8  AST 20 25 29 28 20   ALT 26 35 39 57* 19  ALKPHOS 65 52 49  --  43  PROT 7.2 7.1 6.9 6.5 6.3*  ALBUMIN 4.5 4.5 3.8  --  3.5    Assessment and Plan:  Transient left eye vision loss; Left ICA occlusion s/p revascularization via thrombectomy and stent-assisted angioplasty 02/07/23 with Dr. Corliss Skains   Patient awake, alert and oriented. She is able to move all extremities and follow all commands. Right facial droop, right arm drift. Visual impairment.   MRI/MRA brain pending. Patient started on aspirin and Brilinta this morning.   NIR to follow.   Electronically Signed: Alwyn Ren, AGACNP-BC [phone] 02/07/2023, 1:46 PM   I spent a total of 15 Minutes at the the patient's bedside AND on the patient's hospital floor or unit,  greater than 50% of which was counseling/coordinating care for left ICA occlusion

## 2023-02-07 NOTE — Progress Notes (Addendum)
STROKE TEAM PROGRESS NOTE   INTERVAL HISTORY Is seen in her room with her daughter at the bedside.  On Sunday, she had an episode of amaurosis fugax followed by a repeat episode of this yesterday.  Of note, she is taking Eliquis for atrial fibrillation but had paused this medication for bladder surgery on 4/29.  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  As she was symptomatic, she was taken to IR for angiogram and revascularization of left carotid artery as well as occluded left M2 segment.  Post IR CT demonstrated small left subarachnoid hemorrhage versus contrast.  Vitals:   02/07/23 1000 02/07/23 1100 02/07/23 1200 02/07/23 1300  BP: 90/69 101/70 102/67 (!) 105/54  Pulse: (!) 103 88 86 74  Resp: 12 (!) 27 18 19   Temp:      TempSrc:      SpO2: 96% 90% 91% 93%  Weight:      Height:       CBC:  Recent Labs  Lab 02/06/23 1532 02/07/23 0128  WBC 13.6* 13.0*  NEUTROABS 10.3* 12.3*  HGB 13.9 13.8  HCT 44.4 43.8  MCV 87.9 87.4  PLT 244 224   Basic Metabolic Panel:  Recent Labs  Lab 02/06/23 1532 02/07/23 0128  NA 138 141  K 4.1 4.2  CL 105 111  CO2 26 22  GLUCOSE 100* 149*  BUN 24* 15  CREATININE 0.88 0.72  CALCIUM 8.9 8.4*   Lipid Panel:  Recent Labs  Lab 02/07/23 0128  CHOL 122  TRIG 92  HDL 46  CHOLHDL 2.7  VLDL 18  LDLCALC 58   HgbA1c:  Recent Labs  Lab 02/07/23 0128  HGBA1C 6.2*   Urine Drug Screen:  Recent Labs  Lab 02/07/23 0132  LABOPIA NONE DETECTED  COCAINSCRNUR NONE DETECTED  LABBENZ NONE DETECTED  AMPHETMU NONE DETECTED  THCU NONE DETECTED  LABBARB NONE DETECTED    Alcohol Level No results for input(s): "ETH" in the last 168 hours.  IMAGING past 24 hours CT HEAD WO CONTRAST ( )  Result Date: 02/07/2023 CLINICAL DATA:  Stroke follow-up EXAM: CT HEAD WITHOUT CONTRAST TECHNIQUE: Contiguous axial images were obtained from the base of the skull through the vertex without intravenous contrast. RADIATION  DOSE REDUCTION: This exam was performed according to the departmental dose-optimization program which includes automated exposure control, adjustment of the mA and/or kV according to patient size and/or use of iterative reconstruction technique. COMPARISON:  Angiography 02/06/2023 FINDINGS: Brain: There is acute subarachnoid hemorrhage in the left sylvian fissure and over the left convexity. There is periventricular hypoattenuation compatible with chronic microvascular disease. Vascular: No abnormal hyperdensity of the major intracranial arteries or dural venous sinuses. No intracranial atherosclerosis. Skull: The visualized skull base, calvarium and extracranial soft tissues are normal. Sinuses/Orbits: No fluid levels or advanced mucosal thickening of the visualized paranasal sinuses. No mastoid or middle ear effusion. The orbits are normal. IMPRESSION: Acute subarachnoid hemorrhage in the left Sylvian fissure and over the left convexity. Unchanged compared to the intraprocedural CT obtained during the earlier procedure. Electronically Signed   By: Deatra Robinson M.D.   On: 02/07/2023 02:11   CT ANGIO HEAD NECK W WO CM  Result Date: 02/06/2023 CLINICAL DATA:  Transient monocular vision loss in the left RI EXAM: CT ANGIOGRAPHY HEAD AND NECK WITH AND WITHOUT CONTRAST TECHNIQUE: Multidetector CT imaging of the head and neck was performed using the standard protocol during bolus administration of intravenous contrast. Multiplanar CT image  reconstructions and MIPs were obtained to evaluate the vascular anatomy. Carotid stenosis measurements (when applicable) are obtained utilizing NASCET criteria, using the distal internal carotid diameter as the denominator. RADIATION DOSE REDUCTION: This exam was performed according to the departmental dose-optimization program which includes automated exposure control, adjustment of the mA and/or kV according to patient size and/or use of iterative reconstruction technique.  CONTRAST:  75mL OMNIPAQUE IOHEXOL 350 MG/ML SOLN COMPARISON:  None Available. FINDINGS: CT HEAD FINDINGS Brain: No evidence of acute infarct, hemorrhage, mass, mass effect, or midline shift. No hydrocephalus or extra-axial fluid collection. Periventricular white matter changes, likely the sequela of chronic small vessel ischemic disease. Vascular: No hyperdense vessel. Skull: Negative for fracture or focal lesion. Sinuses/Orbits: No acute finding. Other: The mastoid air cells are well aerated. CTA NECK FINDINGS Aortic arch: Standard branching. Imaged portion shows no evidence of aneurysm or dissection. No significant stenosis of the major arch vessel origins. Mild aortic atherosclerosis. Right carotid system: No evidence of dissection, occlusion, or hemodynamically significant stenosis (greater than 50%). Retropharyngeal course of the mid right ICA Left carotid system: Occlusion of the left ICA just distal to its take-off (series 12, image 117 and series 11, image 116). No other significant stenosis in the opacified portion of the left carotid. No evidence of dissection. Vertebral arteries: No evidence of dissection, occlusion, or hemodynamically significant stenosis (greater than 50%). Skeleton: No acute osseous abnormality. Degenerative changes in the cervical spine. Other neck: Negative. Upper chest: 3 mm subsolid nodule in the right upper lobe (series 11, image 34). Emphysema. Review of the MIP images confirms the above findings CTA HEAD FINDINGS Anterior circulation: Nonopacification of the left ICA until the distal cavernous segment, where there is minimal opacification, with more robust opacification in the supraclinoid segment, likely retrograde. There is opacification the ophthalmic artery. The right internal carotid artery is patent to the terminus without significant stenosis. A1 segments patent, hypoplastic on the left. Normal anterior communicating artery. Anterior cerebral arteries are patent to their  distal aspects without significant stenosis. No M1 stenosis or occlusion. MCA branches perfused to their distal aspects without significant stenosis. Posterior circulation: Vertebral arteries patent to the vertebrobasilar junction without significant stenosis. Posterior inferior cerebellar arteries patent proximally. Basilar patent to its distal aspect without significant stenosis. Superior cerebellar arteries patent proximally. Patent left P1. Fetal origin of the right PCA from the right posterior communicating artery. PCAs perfused to their distal aspects without significant stenosis. The bilateral posterior communicating arteries are patent. Venous sinuses: As permitted by contrast timing, patent. Anatomic variants: Fetal origin of the right PCA. Review of the MIP images confirms the above findings IMPRESSION: 1. 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 No other intracranial large vessel occlusion or significant stenosis. 3. No hemodynamically significant stenosis in the neck. 4. 3 mm subsolid nodule in the right upper lobe. No follow-up needed if patient is low-risk.This recommendation follows the consensus statement: Guidelines for Management of Incidental Pulmonary Nodules Detected on CT Images: From the Fleischner Society 2017; Radiology 2017; 284:228-243. 5. Aortic atherosclerosis. Aortic Atherosclerosis (ICD10-I70.0). These results were called by telephone at the time of interpretation on 02/06/2023 at 5:23 pm to provider [facility] , who verbally acknowledged these results. Electronically Signed   By: Wiliam Ke M.D.   On: 02/06/2023 17:24    PHYSICAL EXAM  General: Alert, well-nourished, well-developed elderly patient in no acute distress Respiratory: Regular, unlabored  respirations on room air  NEURO:  Mental Status: AA&Ox3  Speech/Language: speech is without dysarthria or aphasia.    Cranial Nerves:  II: PERRL.  Right hemianopsia III, IV, VI: Left gaze preference V: Sensation is intact to light touch on the left but absent on the right VII: Smile is symmetrical.  VIII: hearing intact to voice. IX, X: Phonation is normal.  XII: tongue is midline without fasciculations. Motor: 5/5 strength to all muscle groups tested.  Tone: is normal and bulk is normal Sensation-diminished on the right Gait- deferred   ASSESSMENT/PLAN Loretta Gates is a 87 y.o. female with history of A-fib on Eliquis, hypertension, bladder cancer status post TURBT on 4/29, CHF, chronic venous insufficiency, left breast cancer status post chemo and mastectomy, left thyroidectomy, right inferior parathyroidectomy, anemia and neuropathy.  On Sunday, she had an episode of amaurosis fugax followed by a repeat episode of this yesterday.  Of note, she is taking Eliquis for atrial fibrillation but had paused this medication for bladder surgery on 4/29.  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  As she was symptomatic, she was taken to IR for angiogram and revascularization of left carotid artery as well as occluded left M2 segment.  Post IR CT demonstrated small left subarachnoid hemorrhage versus contrast.  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 Small vessel disease.  CTA head & neck occlusion of left ICA just distal to the takeoff, no other hemodynamically significant stenosis IR left ICA occlusion with TICI3 and left M2 nonocclusive clot with TICI2c, as well as left ICA stenting Post IR CT mild left perisylvian hyperattenuation possibly SAH MRI large posterior left  MCA infarct, small left cerebellum, left occipital lobe infarcts.  SAH in the left sylvian fissure and over the posterior left frontal lobe MRA Flow signal is now seen in the cervical, petrous, cavernous, and supraclinoid segments of the left ICA. There is a small filling defect of the petrous segment of the left ICA, which could represent residual thrombus or possibly a dissection. 2D Echo EF 55 to 60% (10/2022) LDL 58 HgbA1c 6.2 UDS negative VTE prophylaxis -SCDs Eliquis (apixaban) daily prior to admission, now on aspirin 81 mg daily and Brilinta (ticagrelor) 90 mg bid due to [facility]. May resume Eliquis after [facility] resolves Therapy recommendations: CIR Disposition: Pending  Atrial fibrillation Failed cardioversion Patient has history of atrial fibrillation on Eliquis Eliquis had been paused for bladder procedure Eliquis held at this time due to [facility], may resume after [facility] resolves  Hypertension Home meds: Irbesartan 300 mg daily Stable BP goal less than 160 given SAH Long-term BP goal normotensive  Lipid management Home meds: None LDL 58, goal < 70 No statin needed at this time given LDL at goal and advanced age  Other Stroke Risk Factors Advanced Age >/= 60  Obesity, Body mass index is 37.16 kg/m., BMI >/= 30 associated with increased stroke risk, recommend weight loss, diet and exercise as appropriate   Other Active Problems Bladder cancer with TURBT 02/05/2023-monitor for hematuria History of left breast cancer status post mastectomy and chemotherapy in 2008 Remote head and neck radiation in 1940s Status post left thyroidectomy Status post right inferior parathyroidectomy  Hospital day # 1  Loretta Gates , MSN, AGACNP-BC [facility] See Amion for schedule and pager information 02/07/2023 2:06 PM   ATTENDING NOTE: I reviewed above note and agree with the assessment and plan. Pt was seen and examined.   Daughter at bedside.  Patient lying bed, lethargic,  but eyes open, awake alert, orientated but psychomotor slowing, still has right facial droop, right hemianopia, mild dysarthria but moving all extremities equally.  MRI showed large left posterior MCA infarct with SAH in sylvian fissure.  MRI showed left ICA now patent.  Currently on aspirin 81 and Brilinta post stenting.  BP goal less than 160.  She passed a swallow, on diet.  PT and OT recommend CIR.  For detailed assessment and plan, please refer to above/below as I have made changes wherever appropriate.   Marvel Plan, MD PhD Stroke Neurology 02/07/2023 5:34 PM  This patient is critically ill due to left MCA stroke, status post thrombectomy, SAH, A-fib not on AC now and at significant risk of neurological worsening, death form recurrent stroke, hemorrhagic transformation, heart failure, respiratory failure, seizure. This patient's care requires constant monitoring of vital signs, hemodynamics, respiratory and cardiac monitoring, review of multiple databases, neurological assessment, discussion with family, other specialists and medical decision making of high complexity. I spent 50 minutes of neurocritical care time in the care of this patient. I had long discussion with daughter at bedside, updated pt current condition, treatment plan and potential prognosis, and answered all the questions.  She expressed understanding and appreciation.       To contact Stroke Continuity provider, please refer to [email or web address]. After hours, contact General Neurology

## 2023-02-07 NOTE — Evaluation (Signed)
Occupational Therapy Evaluation Patient Details Name: Loretta Gates MRN: [id number] DOB: December 13, 1935 Today's Date: 02/07/2023   History of Present Illness 87 y/o female s/p acute left SAH. Status post left common carotid arteriogram right common carotid arteriogram and right vertebral arteriogram 5/1.   Clinical Impression   Pt admitted with the above diagnosis. Pt currently with functional limitations due to the deficits listed below (see OT Problem List). Prior to admit, pt was living with husband and adult daughter at home, Mod I for functional mobility and BADL tasks. Pt was driving at baseline. Pt will benefit from acute skilled OT to increase their safety and independence with ADL and functional mobility for ADL to facilitate discharge. Patient will benefit from intensive inpatient follow up therapy, >3 hours/day. OT will continue to follow patient acutely.         Recommendations for follow up therapy are one component of a multi-disciplinary discharge planning process, led by the attending physician.  Recommendations may be updated based on patient status, additional functional criteria and insurance authorization.   Assistance Recommended at Discharge Frequent or constant Supervision/Assistance  Patient can return home with the following Two people to help with walking and/or transfers;Two people to help with bathing/dressing/bathroom;Assistance with cooking/housework;Help with stairs or ramp for entrance;Assistance with feeding;Assist for transportation;Direct supervision/assist for financial management;Direct supervision/assist for medications management    Functional Status Assessment  Patient has had a recent decline in their functional status and demonstrates the ability to make significant improvements in function in a reasonable and predictable amount of time.  Equipment Recommendations  Other (comment) (TBD)    Recommendations for Other Services Rehab consult      Precautions / Restrictions Precautions Precautions: Fall Precaution Comments: decreased right visual and physical attention Restrictions Weight Bearing Restrictions: No      Mobility Bed Mobility Overal bed mobility: Needs Assistance Bed Mobility: Supine to Sit, Sit to Supine     Supine to sit: Max assist, +2 for physical assistance Sit to supine: Total assist, +2 for physical assistance   General bed mobility comments: VC for hand placement and sequencing Patient Response: Flat affect, Cooperative  Transfers Overall transfer level: Needs assistance Equipment used: 2 person hand held assist Transfers: Sit to/from Stand Sit to Stand: Min assist, +2 physical assistance, +2 safety/equipment, From elevated surface    General transfer comment: 2 person face to face technique.      Balance Overall balance assessment: Needs assistance Sitting-balance support: Feet supported, No upper extremity supported Sitting balance-Leahy Scale: Poor Sitting balance - Comments: sitting EOB Postural control: Right lateral lean (occassional)   Standing balance-Leahy Scale: Poor Standing balance comment: standing at EOB with 2 person assist       ADL either performed or assessed with clinical judgement   ADL Overall ADL's : Needs assistance/impaired Eating/Feeding: Maximal assistance;Bed level   Grooming: Wash/dry hands;Wash/dry face;Maximal assistance;Sitting Grooming Details (indicate cue type and reason): hand over hand assist Upper Body Bathing: Total assistance;Bed level   Lower Body Bathing: Total assistance;Bed level   Upper Body Dressing : Total assistance;Sitting   Lower Body Dressing: Total assistance;Bed level     Toilet Transfer Details (indicate cue type and reason): Unable to complete functional transfer during evaluaton due to elevated HR when standing at EOB. Toileting- Clothing Manipulation and Hygiene: Total assistance;Bed level          Vision Baseline  Vision/History: 1 Wears glasses Ability to See in Adequate Light: 0 Adequate Patient Visual Report: No change from baseline  Vision Assessment?: Yes Eye Alignment: Impaired (comment) Ocular Range of Motion: Restricted on the right Alignment/Gaze Preference: Gaze left Tracking/Visual Pursuits: Decreased smoothness of horizontal tracking;Decreased smoothness of vertical tracking Visual Fields: Right visual field deficit Depth Perception: Undershoots (with right hand) Additional Comments: Nystagmus noted when left eye tracked to the left visual field            Pertinent Vitals/Pain Pain Assessment Pain Assessment: Faces Faces Pain Scale: No hurt     Hand Dominance Left   Extremity/Trunk Assessment Upper Extremity Assessment Upper Extremity Assessment: Difficult to assess due to impaired cognition;RUE deficits/detail;LUE deficits/detail RUE Deficits / Details: Demonstrates generalized weakness including gross grasp. Able to raise UE to touch therapist's hand above pt's shoulder height. Slow motor movement. Impaired finger nose test. RUE Coordination: decreased fine motor;decreased gross motor LUE Deficits / Details: demonstrates generalized weakness. A/ROM appears WFL.   Lower Extremity Assessment Lower Extremity Assessment: Defer to PT evaluation   Cervical / Trunk Assessment Cervical / Trunk Assessment: Other exceptions Cervical / Trunk Exceptions: body habitus   Communication Communication Communication: Expressive difficulties (slurred speech)   Cognition Arousal/Alertness: Lethargic Behavior During Therapy: Flat affect Overall Cognitive Status: Impaired/Different from baseline Area of Impairment: Attention, Memory, Following commands, Safety/judgement, Awareness, Problem solving  Current Attention Level: Focused Memory: Decreased short-term memory Following Commands: Follows one step commands with increased time Safety/Judgement: Decreased awareness of deficits,  Decreased awareness of safety   Problem Solving: Slow processing, Requires tactile cues, Requires verbal cues General Comments: A&O x4     General Comments  HR elevated to 147 at the highest when standing at EOB. Pt reported throughout session that she just felt "off."            Home Living Family/patient expects to be discharged to:: Private residence Living Arrangements: Spouse/significant other;Children (adult daughter) Available Help at Discharge: Family;Available 24 hours/day Type of Home: House Home Access: Stairs to enter [employer] of Steps: 4 Entrance Stairs-Rails: Right;Left;Can reach both Home Layout: Able to live on main level with bedroom/bathroom;Laundry or work area in basement     [employer] Shower/Tub: Chief Strategy Officer: Standard     Home Equipment: Shower seat;Cane - quad;Cane - single point;Wheelchair - Forensic psychologist (2 wheels);Crutches;Adaptive equipment;Grab bars - tub/shower Adaptive Equipment: Sock aid        Prior Functioning/Environment Prior Level of Function : Independent/Modified Independent      Mobility Comments: SPC used during the day. Quad cane is used during the night. ADLs Comments: has sock aid, needs help with fastening bra        OT Problem List: Decreased strength;Decreased coordination;Impaired sensation;Decreased cognition;Decreased range of motion;Decreased activity tolerance;Impaired balance (sitting and/or standing);Impaired vision/perception;Impaired UE functional use      OT Treatment/Interventions: Self-care/ADL training;Splinting;Therapeutic activities;Therapeutic exercise;Neuromuscular education;Cognitive remediation/compensation;Visual/perceptual remediation/compensation;Energy conservation;DME and/or AE instruction;Patient/family education;Balance training;Manual therapy;Modalities    OT Goals(Current goals can be found in the care plan section) Acute Rehab OT Goals Patient Stated  Goal: to sit up OT Goal Formulation: Patient unable to participate in goal setting Time For Goal Achievement: 02/21/23 Potential to Achieve Goals: Good  OT Frequency: Min 2X/week    Co-evaluation PT/OT/SLP Co-Evaluation/Treatment: Yes Reason for Co-Treatment: For patient/therapist safety;To address functional/ADL transfers   OT goals addressed during session: Strengthening/ROM;Other (comment) (vision and cognition)      AM-PAC OT "6 Clicks" Daily Activity     Outcome Measure Help from another person eating meals?: A Lot Help from another person taking care of personal grooming?:  A Lot Help from another person toileting, which includes using toliet, bedpan, or urinal?: Total Help from another person bathing (including washing, rinsing, drying)?: Total Help from another person to put on and taking off regular upper body clothing?: A Lot Help from another person to put on and taking off regular lower body clothing?: Total 6 Click Score: 9   End of Session Equipment Utilized During Treatment: Gait belt Nurse Communication: Other (comment) (HR during session)  Activity Tolerance: Patient tolerated treatment well;Other (comment) (limited by elevated HR) Patient left: in bed;with call bell/phone within reach;with bed alarm set;with family/visitor present  OT Visit Diagnosis: Muscle weakness (generalized) (M62.81);Ataxia, unspecified (R27.0);Other symptoms and signs involving cognitive function;Hemiplegia and hemiparesis Hemiplegia - Right/Left: Right Hemiplegia - dominant/non-dominant: Dominant Hemiplegia - caused by: Nontraumatic SAH                Time: 8119-1478 OT Time Calculation (min): 45 min Charges:  OT General Charges $OT Visit: 1 Visit OT Evaluation $OT Eval High Complexity: 1 High OT Treatments $Therapeutic Activity: 8-22 mins  Loretta Gates, OTR/L,CBIS  Supplemental OT - [facility] and [facility] Secure Chat Preferred    Elza Sortor, Charisse March 02/07/2023, 2:11 PM

## 2023-02-07 NOTE — Progress Notes (Signed)
   02/07/23 1000  Spiritual Encounters  Type of Visit Initial  Care provided to: Patient  Referral source Chaplain team  Reason for visit Routine spiritual support  OnCall Visit No  Spiritual Framework  Values/beliefs faith  Community/Connection Family  Patient Stress Factors Health changes  Interventions  Spiritual Care Interventions Made Compassionate presence;Reflective listening;Normalization of emotions;Established relationship of care and support   Ch responded to request for emotional and spiritual support. Pt's daughter was at bedside. Pt could not understand why they were keeping her in the hospital. She loves her family and would rather be at home. Pt said, "the Loretta Gates is the source of my joy." Ch provided hospitality and encourage storytelling. No follow-up needed at this time.

## 2023-02-08 ENCOUNTER — Telehealth (HOSPITAL_COMMUNITY): Payer: Self-pay | Admitting: Pharmacy Technician

## 2023-02-08 ENCOUNTER — Other Ambulatory Visit (HOSPITAL_COMMUNITY): Payer: Self-pay

## 2023-02-08 ENCOUNTER — Other Ambulatory Visit: Payer: Self-pay | Admitting: Family Medicine

## 2023-02-08 DIAGNOSIS — H539 Unspecified visual disturbance: Secondary | ICD-10-CM

## 2023-02-08 DIAGNOSIS — I63412 Cerebral infarction due to embolism of left middle cerebral artery: Secondary | ICD-10-CM

## 2023-02-08 DIAGNOSIS — G8191 Hemiplegia, unspecified affecting right dominant side: Secondary | ICD-10-CM

## 2023-02-08 DIAGNOSIS — I6522 Occlusion and stenosis of left carotid artery: Secondary | ICD-10-CM | POA: Diagnosis not present

## 2023-02-08 DIAGNOSIS — I63512 Cerebral infarction due to unspecified occlusion or stenosis of left middle cerebral artery: Secondary | ICD-10-CM | POA: Diagnosis not present

## 2023-02-08 MED ORDER — METOPROLOL TARTRATE 5 MG/5ML IV SOLN
INTRAVENOUS | Status: AC
  Filled 2023-02-08: qty 5

## 2023-02-08 MED ORDER — TICAGRELOR 90 MG PO TABS
45.0000 mg | ORAL_TABLET | Freq: Two times a day (BID) | ORAL | Status: DC
  Administered 2023-02-08 – 2023-02-13 (×11): 45 mg via ORAL
  Filled 2023-02-08 (×11): qty 1

## 2023-02-08 MED ORDER — SODIUM CHLORIDE 0.9 % IV BOLUS
500.0000 mL | Freq: Once | INTRAVENOUS | Status: AC
  Administered 2023-02-08: 500 mL via INTRAVENOUS

## 2023-02-08 MED ORDER — METOPROLOL TARTRATE 5 MG/5ML IV SOLN
5.0000 mg | INTRAVENOUS | Status: DC | PRN
  Administered 2023-02-08: 5 mg via INTRAVENOUS
  Filled 2023-02-08: qty 5

## 2023-02-08 MED ORDER — METOPROLOL TARTRATE 50 MG PO TABS
75.0000 mg | ORAL_TABLET | Freq: Two times a day (BID) | ORAL | Status: DC
  Administered 2023-02-08 – 2023-02-13 (×11): 75 mg via ORAL
  Filled 2023-02-08 (×11): qty 1

## 2023-02-08 MED ORDER — QUETIAPINE FUMARATE 25 MG PO TABS
25.0000 mg | ORAL_TABLET | Freq: Every day | ORAL | Status: DC
  Administered 2023-02-08: 25 mg via ORAL
  Filled 2023-02-08: qty 1

## 2023-02-08 NOTE — Progress Notes (Signed)
Orthopedic Tech Progress Note Patient Details:  FLYNN LININGER 30-Nov-1935 [id number]  [facility] and resting WHO called into [facility].  Patient ID: DESHA BITNER, female   DOB: 04/19/1936, 87 y.o.   MRN: [id number]  Docia Furl 02/08/2023, 12:01 PM

## 2023-02-08 NOTE — TOC Benefit Eligibility Note (Signed)
Patient Advocate Encounter  Insurance verification completed.    The patient is currently admitted and upon discharge could be taking Brilinta 90 mg.  The current 30 day co-pay is $100.00.   The patient is insured through [employer] Medicare Part D   This test claim was processed through [facility]- copay amounts may vary at other pharmacies due to pharmacy/plan contracts, or as the patient moves through the different stages of their insurance plan.  Olamide Carattini, CPHT Pharmacy Patient Advocate Specialist [facility] Patient Advocate Team Direct Number: [phone]  Fax: [phone]       

## 2023-02-08 NOTE — Progress Notes (Signed)
Speech Language Pathology Treatment: Dysphagia  Patient Details Name: Loretta Gates MRN: [id number] DOB: 1936/03/08 Today's Date: 02/08/2023 Time: 7829-5621 SLP Time Calculation (min) (ACUTE ONLY): 13 min  Assessment / Plan / Recommendation Clinical Impression  [name] was lethargic today; would rouse to answer questions or feed herself briefly with mod verbal/tactile cues. She fed herself several bites of applesauce and drank sips of water from the cup with adequate oral attention, a palpable swallow, and no s/s of aspiration. Voice was clear, no cough.  Recommend continuing current diet for now. D/W Loretta Gates, daughter, that SLP will follow to advance diet as her mother begins to wake up; will also follow for communication.  D/W RN.   HPI HPI: Loretta Gates is a 87 y.o. female with a past medical history significant for atrial fibrillation on Eliquis (last dose 4/30 a.m. s/p failed cardioversion 1 to 2 months ago), hypertension bladder cancer s/p TURBT (02/05/2023), heart failure, chronic venous insufficiency, left breast cancer s/p chemotherapy and mastectomy (2008), remote head/neck radiation (1940s at age 58), left thyroidectomy and right inferior parathyroidectomy, anemia, mild neuropathy. Prior to admission, she had several episodes of transient vision loss with rapid return to baseline. Due to recurrent symptoms at [facility] she was emergently evaluated by video neurology and decision was made to emergently assess her left carotid occlusion with potential for stent placement due to concern for stump embolization versus collateral failure versus acute occlusion. s/p Status post left common carotid arteriogram right common carotid arteriogram and right vertebral arteriogram 5/1.      SLP Plan  Continue with current plan of care      Recommendations for follow up therapy are one component of a multi-disciplinary discharge planning process, led by the attending physician.   Recommendations may be updated based on patient status, additional functional criteria and insurance authorization.    Recommendations  Diet recommendations: Dysphagia 1 (puree);Thin liquid Liquids provided via: Cup Medication Administration: Whole meds with puree Supervision: Staff to assist with self feeding Compensations: Slow rate;Small sips/bites;Lingual sweep for clearance of pocketing;Monitor for anterior loss Postural Changes and/or Swallow Maneuvers: Seated upright 90 degrees                  Oral care BID   Frequent or constant Supervision/Assistance Dysphagia, oropharyngeal phase (R13.12);Dysarthria and anarthria (R47.1);Cognitive communication deficit (R41.841)     Continue with current plan of care    Loretta Arizpe L. [name], MA CCC/SLP Clinical Specialist - Acute Care SLP Acute Rehabilitation Services Office number [phone]  Loretta Gates Loretta Gates  02/08/2023, 1:38 PM

## 2023-02-08 NOTE — Telephone Encounter (Signed)
Pharmacy Patient Advocate Encounter  Insurance verification completed.    The patient is insured through [employer] Medicare Part D   The patient is currently admitted and ran test claims for the following: Brilinta.  Copays and coinsurance results were relayed to Inpatient clinical team.  

## 2023-02-08 NOTE — Progress Notes (Signed)
Physical Therapy Treatment Patient Details Name: Loretta Gates MRN: [id number] DOB: 06-Dec-1935 Today's Date: 02/08/2023   History of Present Illness Loretta Gates is a 87 y.o. female  who presented to ED with transient changes in vision. 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 s/p failed cardioversion 1 to 2 months ago), hypertension bladder cancer s/p TURBT (02/05/2023), heart failure, chronic venous insufficiency, left breast cancer s/p chemotherapy and mastectomy (2008), remote head/neck radiation (1940s at age 49), left thyroidectomy and right inferior parathyroidectomy, anemia, mild neuropathy    PT Comments    The pt was agreeable to session, and pt family present and very supportive. Pt presenting with some lethargy but does open eyes to max stimulation and cues, was able to perform some self-care tasks such as wiping her face without additional cues or assist. However, the pt remained lethargic even with transition to sitting EOB (maxA) and required constant assist to maintain seated balance. Pt with no change in vitals, and no change in alertness or participation with transition to sitting, therefore returned to supine at end of session. Will continue to benefit from skilled PT and attempt to progress activity tolerance and capacity for OOB transfers and gait.     Recommendations for follow up therapy are one component of a multi-disciplinary discharge planning process, led by the attending physician.  Recommendations may be updated based on patient status, additional functional criteria and insurance authorization.  Follow Up Recommendations       Assistance Recommended at Discharge Frequent or constant Supervision/Assistance  Patient can return home with the following A little help with walking and/or transfers;A little help with bathing/dressing/bathroom;Assist for transportation;Help with stairs or  ramp for entrance   Equipment Recommendations   (TBD)    Recommendations for Other Services       Precautions / Restrictions Precautions Precautions: Fall Precaution Comments: decreased right visual and physical attention Restrictions Weight Bearing Restrictions: No     Mobility  Bed Mobility Overal bed mobility: Needs Assistance Bed Mobility: Supine to Sit, Sit to Supine     Supine to sit: Max assist Sit to supine: Total assist   General bed mobility comments: pt needing repeated cues for hand placement, assist with all extremities and sequential cues    Transfers                   General transfer comment: unsafe to attempt due to lethargy      Balance Overall balance assessment: Needs assistance Sitting-balance support: Feet supported, No upper extremity supported Sitting balance-Leahy Scale: Poor Sitting balance - Comments: needed constant hands-on to maintain static sitting EOB, pt with head forwards and did not lift or open eyes to voice (VSS, suspect lethargy) Postural control: Right lateral lean, Posterior lean                                  Cognition Arousal/Alertness: Lethargic Behavior During Therapy: Flat affect Overall Cognitive Status: Impaired/Different from baseline Area of Impairment: Attention, Memory, Following commands, Safety/judgement, Awareness, Problem solving                   Current Attention Level: Focused Memory: Decreased short-term memory Following Commands: Follows one step commands with increased time Safety/Judgement: Decreased awareness of deficits, Decreased awareness of safety   Problem Solving: Slow processing, Requires tactile cues,  Requires verbal cues General Comments: pt perseverating on feeling strange, but not able to answer many additional questions well this session due to lethargy. very intermittent command following, likely due to lethargy        Exercises      General Comments  General comments (skin integrity, edema, etc.): Hr to 93 with sitting EOB, BP stable      Pertinent Vitals/Pain Pain Assessment Pain Assessment: Faces Faces Pain Scale: No hurt Pain Intervention(s): Monitored during session     PT Goals (current goals can now be found in the care plan section) Acute Rehab PT Goals Patient Stated Goal: feel better PT Goal Formulation: With patient/family Time For Goal Achievement: 02/21/23 Potential to Achieve Goals: Good Progress towards PT goals: Not progressing toward goals - comment (limited by lethargy)    Frequency    Min 4X/week      PT Plan Current plan remains appropriate       AM-PAC PT "6 Clicks" Mobility   Outcome Measure  Help needed turning from your back to your side while in a flat bed without using bedrails?: Total Help needed moving from lying on your back to sitting on the side of a flat bed without using bedrails?: A Lot Help needed moving to and from a bed to a chair (including a wheelchair)?: Total Help needed standing up from a chair using your arms (e.g., wheelchair or bedside chair)?: Total Help needed to walk in hospital room?: Total Help needed climbing 3-5 steps with a railing? : Total 6 Click Score: 7    End of Session   Activity Tolerance: Patient tolerated treatment well Patient left: in bed;with call bell/phone within reach;with bed alarm set;with family/visitor present Nurse Communication: Mobility status PT Visit Diagnosis: Unsteadiness on feet (R26.81);Muscle weakness (generalized) (M62.81);Other symptoms and signs involving the nervous system (R29.898)     Time: 4098-1191 PT Time Calculation (min) (ACUTE ONLY): 34 min  Charges:  $Therapeutic Exercise: 8-22 mins $Therapeutic Activity: 8-22 mins                     Vickki Muff, PT, DPT   Acute Rehabilitation Department Office [phone] Secure Chat Communication Preferred   Ronnie Derby 02/08/2023, 4:15 PM

## 2023-02-08 NOTE — Consult Note (Signed)
Physical Medicine and Rehabilitation Consult Reason for Consult:impaired functional mobility after a stroke Referring Physician: Roda Shutters   HPI: Loretta Gates is a 87 y.o. female with a past medical history of atrial fibrillation on Eliquis (which she had been holding for bladder surgery), hypertension, bladder cancer (with hx of TURBT), left breast cancer who presented on 02/06/2023 with a 2-day history of visual changes in her right eye.  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  She was taken to IR for angiogram and revascularization of the left carotid artery as well as includes a left M2 segment.  Post radiology CT demonstrated small left subarachnoid hemorrhage potentially.  MRI revealed a left MCA infarct involving large portions of the left parietal and temporal lobes and to a lesser degree the left frontal lobe and insula.  There were additional small acute infarcts also present in the left cerebellum and left occipital lobe.  Subarachnoid hemorrhage was redemonstrated in the left sylvian fissure and over the posterior left frontal lobe.  Patient was placed on aspirin 81 mg daily as well as Brilinta 90 mg twice daily given her subarachnoid hemorrhage.  Plan is to resume Eliquis after subarachnoid hemorrhage resolves.  Patient's also had hematuria related to her bladder cancer in prior anticoagulation.  Patient was up with physical therapy yesterday and was min assist for sit to stand transfers +2.  She only took 4 steps his heart rate increased to 156 upon standing.  Rate eventually climbed to 180 last night which resolved with IV metoprolol.  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 patel;   2006  s/p left thyroidectomy w/ right inferior parathyoidectomy  (per path speciman marked parathyroid but only thyroid tissue)   Hypertension    IDA (iron deficiency anemia)    hematology/ oncologist--- dr ennever/ sarah carter NP;  treated w/ IV iron infusions   Multinodular thyroid    Neuropathy    mild hands/ feet, uses cane   Nocturia more than twice per night    Nocturnal leg cramps    OA (osteoarthritis)    knees   OAB (overactive bladder)    Osteoporosis    PAF (paroxysmal atrial fibrillation) [facility])    cardiologist--- dr Jens Som   PONV (postoperative nausea and vomiting)    Vitamin D deficiency    Wears glasses    Past Surgical  History:  Procedure Laterality Date   BREAST LUMPECTOMY WITH RADIOACTIVE SEED LOCALIZATION Right 10/25/2021   Procedure: RIGHT BREAST LUMPECTOMY WITH RADIOACTIVE SEED LOCALIZATION;  Surgeon: Harriette Bouillon, MD;  Location: [facility];  Service: General;  Laterality: Right;   CARDIOVERSION N/A 01/01/2023   Procedure: CARDIOVERSION;  Surgeon: [name], MD;  Location: [facility] ENDOSCOPY;  Service: Cardiovascular;  Laterality: N/A;   CHOLECYSTECTOMY, LAPAROSCOPIC  02/25/2017   @[facility]    COLONOSCOPY WITH ESOPHAGOGASTRODUODENOSCOPY (EGD)  2022   MASTECTOMY WITH  AXILLARY LYMPH NODE DISSECTION Left 03/05/2007   @ [facility]   OVARIAN CYST SURGERY     age 57;   abdominal   RADIOLOGY WITH ANESTHESIA N/A 02/06/2023   Procedure: IR WITH ANESTHESIA;  Surgeon: Radiologist, Medication, MD;  Location: [facility] OR;  Service: Radiology;  Laterality: N/A;   THYROIDECTOMY, PARTIAL Left 2006   left lobectomy and right infertior parathyroidectomy   TONSILLECTOMY     child   Family History  Problem Relation Age of Onset   Sudden death Neg Hx    Social History:  reports that she has never smoked. She has never used smokeless tobacco. She reports that she does not drink alcohol and does not use drugs. Allergies:  Allergies  Allergen Reactions   Bee Venom Swelling    Bee stung her on the inside of her gum. Lips and gums started swelling. No SOB.   Levaquin [Levofloxacin] Hives and Itching   Peanut-Containing Drug Products Other (See Comments)    Stomach pain and diarrhea    Medications Prior to Admission  Medication Sig Dispense Refill   apixaban (ELIQUIS) 5 MG TABS tablet TAKE ONE TABLET BY MOUTH TWICE A DAY 60 tablet 3   cholestyramine (QUESTRAN) 4 GM/DOSE powder Take 1 packet (4 g total) by mouth See admin instructions. Mix 1 teaspoon in water and take at 6pm daily with warfarin. (Patient taking differently: Take 4 g by mouth See admin instructions. Mix 1 teaspoon in water and take daily) 378 g 3   cinacalcet (SENSIPAR) 30 MG tablet Take 30 mg by mouth daily with breakfast.     denosumab (PROLIA) 60 MG/ML SOSY injection Inject 60 mg into the skin every 6 (six) months.     empagliflozin (JARDIANCE) 10 MG TABS tablet TAKE ONE TABLET BY MOUTH ONE TIME DAILY (Patient taking differently: Take 10 mg by mouth daily.) 30 tablet 3   ergocalciferol (VITAMIN D2) 1.25 MG (50000 UT) capsule Take 50,000 Units by mouth every 14 (fourteen) days. Every other saturday     furosemide (LASIX) 20 MG tablet Alternate taking 20 mg po daily with taking 30 mg po daily ever other day (Patient  taking differently: Take 20 mg by mouth as directed. Alternate taking 20 mg po daily with taking 30 mg po daily ever other day) 135 tablet 3   irbesartan (AVAPRO) 300 MG tablet Take 1 tablet (300 mg total) by mouth daily. (Patient taking differently: Take 300 mg by mouth daily.) 90 tablet 1   Magnesium Oxide -Mg Supplement 500 MG TABS Take 500 mg by mouth daily.     metoprolol tartrate (LOPRESSOR) 50 MG tablet Take 1.5 tablets (75 mg total) by mouth 2 (two) times daily. 270 tablet 1   omeprazole (PRILOSEC) 40 MG capsule Take 1 capsule (40 mg total) by mouth 2 (two) times daily before lunch and supper. (Patient taking differently: Take 40 mg by mouth in the morning and at bedtime.) 180 capsule 1   oxybutynin (DITROPAN XL) 15 MG  24 hr tablet Take 1 tablet (15 mg total) by mouth at bedtime. (Patient taking differently: Take 15 mg by mouth at bedtime.) 90 tablet 3   traMADol (ULTRAM) 50 MG tablet Take 1 tablet (50 mg total) by mouth every 6 (six) hours as needed. (Patient not taking: Reported on 02/06/2023) 20 tablet 0    Home: Home Living Family/patient expects to be discharged to:: Private residence Living Arrangements: Spouse/significant other, Children (adult daughter) Available Help at Discharge: Family, Available 24 hours/day Type of Home: House Home Access: Stairs to enter [employer] of Steps: 4 Entrance Stairs-Rails: Right, Left, Can reach both Home Layout: Able to live on main level with bedroom/bathroom, Laundry or work area in basement [employer] Shower/Tub: Engineer, manufacturing systems: Standard Home Equipment: Information systems manager, Medical laboratory scientific officer - quad, Medical laboratory scientific officer - single point, Wheelchair - manual, Agricultural consultant (2 wheels), Crutches, Adaptive equipment, Grab bars - tub/shower Adaptive Equipment: Sock aid  Functional History: Prior Function Prior Level of Function : Independent/Modified Independent Mobility Comments: SPC used during the day. Quad cane is used during the night. ADLs  Comments: has sock aid, needs help with fastening bra Functional Status:  Mobility: Bed Mobility Overal bed mobility: Needs Assistance Bed Mobility: Supine to Sit, Sit to Supine Supine to sit: Max assist, +2 for physical assistance Sit to supine: Total assist, +2 for physical assistance General bed mobility comments: VC for hand placement and sequencing Transfers Overall transfer level: Needs assistance Equipment used: 2 person hand held assist Transfers: Sit to/from Stand Sit to Stand: Min assist, +2 physical assistance, +2 safety/equipment, From elevated surface General transfer comment: 2 person face to face technique. Ambulation/Gait General Gait Details: upon standing pt's HR increased to 156bpm and noted to be in afib. limited to 4 side steps to [facility], assist to move R LE    ADL: ADL Overall ADL's : Needs assistance/impaired Eating/Feeding: Maximal assistance, Bed level Grooming: Wash/dry hands, Wash/dry face, Maximal assistance, Sitting Grooming Details (indicate cue type and reason): hand over hand assist Upper Body Bathing: Total assistance, Bed level Lower Body Bathing: Total assistance, Bed level Upper Body Dressing : Total assistance, Sitting Lower Body Dressing: Total assistance, Bed level Toilet Transfer Details (indicate cue type and reason): Unable to complete functional transfer during evaluaton due to elevated HR when standing at EOB. Toileting- Clothing Manipulation and Hygiene: Total assistance, Bed level  Cognition: Cognition Overall Cognitive Status: Impaired/Different from baseline Arousal/Alertness: Lethargic Orientation Level: Oriented to person, Oriented to place, Oriented to time, Disoriented to situation Attention: Sustained Sustained Attention: Impaired Sustained Attention Impairment: Verbal complex, Functional complex Memory: Impaired Memory Impairment: Retrieval deficit Awareness: Impaired Awareness Impairment: Intellectual impairment Problem  Solving: Impaired Problem Solving Impairment: Verbal complex Cognition Arousal/Alertness: Lethargic Behavior During Therapy: Flat affect Overall Cognitive Status: Impaired/Different from baseline Area of Impairment: Attention, Memory, Following commands, Safety/judgement, Awareness, Problem solving Current Attention Level: Focused Memory: Decreased short-term memory Following Commands: Follows one step commands with increased time Safety/Judgement: Decreased awareness of deficits, Decreased awareness of safety Problem Solving: Slow processing, Requires tactile cues, Requires verbal cues General Comments: A&O x4, perseverated on "i feel so strange." educated given on artery occlusion and the effect on the brain, dtr present and appreciative  Blood pressure 107/78, pulse 79, temperature 97.7 F (36.5 C), temperature source Axillary, resp. rate 19, height 4\' 11"  (1.499 m), weight 83.5 kg, SpO2 95 %. Physical Exam Constitutional:      Comments: somnolent  HENT:     Head: Normocephalic.     Nose: Nose  normal.  Eyes:     Comments: Ptosis left   Cardiovascular:     Rate and Rhythm: Normal rate. Rhythm irregular.  Pulmonary:     Effort: Pulmonary effort is normal.  Abdominal:     Palpations: Abdomen is soft.  Musculoskeletal:     Cervical back: Normal range of motion.     Comments: Valgus deformity right knee. Both knees with chronic changes, tender with PROM  Neurological:     Comments: Pt arouses with tactile and verbal stim. Otherwise fairly somnolent. Was non-verbal. Did attempt to open eyes and was able to partially open on left. Right central 7 and tongue deviation. Motor: no activ movement today RUE. RLE grossly 1-2/5. LUE grossly 3-4/5. LLE 2-3/5. Did sense pain in all 4 limbs. DTR's 2+ on right. Perhaps mild resting tone RUE and RLE.   Psychiatric:     Comments: somnolent     No results found for this or any previous visit (from the past 24 hour(s)). CT HEAD WO CONTRAST  ( )  Addendum Date: 02/08/2023   ADDENDUM REPORT: 02/08/2023 01:11 ADDENDUM: The area of parenchymal hemorrhage is new compared to the prior CT but was present on the interval MRI and appears stable from that exam. These findings were discussed by telephone on 02/08/2023 at 1:11 am with provider SRISHTI BHAGAT . Electronically Signed   By: Wiliam Ke M.D.   On: 02/08/2023 01:11   Result Date: 02/08/2023 CLINICAL DATA:  Stroke, follow-up EXAM: CT HEAD WITHOUT CONTRAST TECHNIQUE: Contiguous axial images were obtained from the base of the skull through the vertex without intravenous contrast. RADIATION DOSE REDUCTION: This exam was performed according to the departmental dose-optimization program which includes automated exposure control, adjustment of the mA and/or kV according to patient size and/or use of iterative reconstruction technique. COMPARISON:  02/07/2023 CT head 1:56 a.m., correlation is also made with 02/07/2023 MRI head FINDINGS: Brain: New area of parenchymal hemorrhage in the left temporal lobe, adjacent to the sylvian fissure, which measures approximately 2.0 x 1.3 x 1.5 cm (AP x TR x CC) (series 6, image 46 and series 5, image 38), with a volume of approximately 2 mL. Mild surrounding hypodensity, likely edema. Redemonstrated subarachnoid hemorrhage in the left sylvian fissure and along the left frontotemporal convexity. Hypodensity and loss of gray-white differentiation in the left posterior MCA territory (series 3, image 19), consistent with of all vein Ng infarct. No evidence of hemorrhage in this area. No significant mass effect or midline shift. No hydrocephalus. Vascular: No hyperdense vessel. Skull: Negative for fracture or focal lesion. Sinuses/Orbits: No acute finding. Other: The mastoid air cells are well aerated. IMPRESSION: 1. New area of parenchymal hemorrhage in the left temporal lobe, adjacent to the Sylvian fissure, which measures up to 2 cm, with a volume of approximately 2 mL.  Heidelberg classification 1c: PH1, hematoma within infarcted tissue, occupying <30%, no substantive mass effect. 2. Redemonstrated subarachnoid hemorrhage in the left Sylvian fissure and along the left frontotemporal convexity. 3. Hypodensity and loss of gray-white differentiation in the left posterior MCA territory, consistent with evolving acute infarct. No evidence of hemorrhage in this area. 4. No significant mass effect or midline shift. These results will be called to the ordering clinician or representative by the Radiologist Assistant, and communication documented in the PACS or [employer]. Electronically Signed: By: Wiliam Ke M.D. On: 02/08/2023 00:11   MR ANGIO HEAD WO CONTRAST  Result Date: 02/07/2023 CLINICAL DATA:  Stroke suspected EXAM: MRI HEAD WITHOUT  CONTRAST MRA HEAD WITHOUT CONTRAST TECHNIQUE: Multiplanar, multi-echo pulse sequences of the brain and surrounding structures were acquired without intravenous contrast. Angiographic images of the Circle of Willis were acquired using MRA technique without intravenous contrast. COMPARISON:  CTA head and neck angiogram 02/06/2023 FINDINGS: MRI HEAD FINDINGS Brain: Acute left MCA territory infarct involving large portions of the left parietal and temporal lobes, as well as the left frontal lobe in the opercular regions. Additional small acute infarcts are also present in the left cerebellum, left occipital lobe. Redemonstrated is subarachnoid hemorrhage in the left sylvian fissure and over the posterior left frontal lobe. Sequela of moderate chronic microvascular ischemic change. No hydrocephalus. Vascular: See below Skull and upper cervical spine: Normal marrow signal. Sinuses/Orbits: Trace bilateral mastoid effusions. No middle ear effusion. Paranasal sinuses are clear. Orbits are unremarkable. Other: None. MRA HEAD FINDINGS Anterior circulation: Flow signal is now seen in the cervical, petrous, cavernous, and supraclinoid segments of the  left ICA. Vessels in the left MCA territory appear asymmetrically small in size compared to the right. There is a small filling defect of the petrous segment of the left ICA (series 9, image 52). Posterior circulation: Normal Anatomic variants: IMPRESSION: 1. Acute left MCA territory infarct involving large portions of the left parietal and temporal lobes, and to a lesser degree the left frontal lobe and insula. 2. Additional small acute infarcts are also present in the left cerebellum and the left occipital lobe, which raises the possibility for a central embolic phenomenon. 3. Redemonstrated subarachnoid hemorrhage in the left Sylvian fissure and over the posterior left frontal lobe. 4. Flow signal is now seen in the cervical, petrous, cavernous, and supraclinoid segments of the left ICA. There is a small filling defect of the petrous segment of the left ICA, which could represent residual thrombus or possibly a dissection. Consider CTA head for further evaluation. These results will be called to the ordering clinician or representative by the Radiologist Assistant, and communication documented in the PACS or [employer]. Electronically Signed   By: Lorenza Cambridge M.D.   On: 02/07/2023 15:17   MR BRAIN WO CONTRAST  Result Date: 02/07/2023 CLINICAL DATA:  Stroke suspected EXAM: MRI HEAD WITHOUT CONTRAST MRA HEAD WITHOUT CONTRAST TECHNIQUE: Multiplanar, multi-echo pulse sequences of the brain and surrounding structures were acquired without intravenous contrast. Angiographic images of the Circle of Willis were acquired using MRA technique without intravenous contrast. COMPARISON:  CTA head and neck angiogram 02/06/2023 FINDINGS: MRI HEAD FINDINGS Brain: Acute left MCA territory infarct involving large portions of the left parietal and temporal lobes, as well as the left frontal lobe in the opercular regions. Additional small acute infarcts are also present in the left cerebellum, left occipital lobe.  Redemonstrated is subarachnoid hemorrhage in the left sylvian fissure and over the posterior left frontal lobe. Sequela of moderate chronic microvascular ischemic change. No hydrocephalus. Vascular: See below Skull and upper cervical spine: Normal marrow signal. Sinuses/Orbits: Trace bilateral mastoid effusions. No middle ear effusion. Paranasal sinuses are clear. Orbits are unremarkable. Other: None. MRA HEAD FINDINGS Anterior circulation: Flow signal is now seen in the cervical, petrous, cavernous, and supraclinoid segments of the left ICA. Vessels in the left MCA territory appear asymmetrically small in size compared to the right. There is a small filling defect of the petrous segment of the left ICA (series 9, image 52). Posterior circulation: Normal Anatomic variants: IMPRESSION: 1. Acute left MCA territory infarct involving large portions of the left parietal and temporal lobes, and  to a lesser degree the left frontal lobe and insula. 2. Additional small acute infarcts are also present in the left cerebellum and the left occipital lobe, which raises the possibility for a central embolic phenomenon. 3. Redemonstrated subarachnoid hemorrhage in the left Sylvian fissure and over the posterior left frontal lobe. 4. Flow signal is now seen in the cervical, petrous, cavernous, and supraclinoid segments of the left ICA. There is a small filling defect of the petrous segment of the left ICA, which could represent residual thrombus or possibly a dissection. Consider CTA head for further evaluation. These results will be called to the ordering clinician or representative by the Radiologist Assistant, and communication documented in the PACS or [employer]. Electronically Signed   By: Lorenza Cambridge M.D.   On: 02/07/2023 15:17   CT HEAD WO CONTRAST ( )  Result Date: 02/07/2023 CLINICAL DATA:  Stroke follow-up EXAM: CT HEAD WITHOUT CONTRAST TECHNIQUE: Contiguous axial images were obtained from the base of the  skull through the vertex without intravenous contrast. RADIATION DOSE REDUCTION: This exam was performed according to the departmental dose-optimization program which includes automated exposure control, adjustment of the mA and/or kV according to patient size and/or use of iterative reconstruction technique. COMPARISON:  Angiography 02/06/2023 FINDINGS: Brain: There is acute subarachnoid hemorrhage in the left sylvian fissure and over the left convexity. There is periventricular hypoattenuation compatible with chronic microvascular disease. Vascular: No abnormal hyperdensity of the major intracranial arteries or dural venous sinuses. No intracranial atherosclerosis. Skull: The visualized skull base, calvarium and extracranial soft tissues are normal. Sinuses/Orbits: No fluid levels or advanced mucosal thickening of the visualized paranasal sinuses. No mastoid or middle ear effusion. The orbits are normal. IMPRESSION: Acute subarachnoid hemorrhage in the left Sylvian fissure and over the left convexity. Unchanged compared to the intraprocedural CT obtained during the earlier procedure. Electronically Signed   By: Deatra Robinson M.D.   On: 02/07/2023 02:11   CT ANGIO HEAD NECK W WO CM  Result Date: 02/06/2023 CLINICAL DATA:  Transient monocular vision loss in the left RI EXAM: CT ANGIOGRAPHY HEAD AND NECK WITH AND WITHOUT CONTRAST TECHNIQUE: Multidetector CT imaging of the head and neck was performed using the standard protocol during bolus administration of intravenous contrast. Multiplanar CT image reconstructions and MIPs were obtained to evaluate the vascular anatomy. Carotid stenosis measurements (when applicable) are obtained utilizing NASCET criteria, using the distal internal carotid diameter as the denominator. RADIATION DOSE REDUCTION: This exam was performed according to the departmental dose-optimization program which includes automated exposure control, adjustment of the mA and/or kV according to  patient size and/or use of iterative reconstruction technique. CONTRAST:  75mL OMNIPAQUE IOHEXOL 350 MG/ML SOLN COMPARISON:  None Available. FINDINGS: CT HEAD FINDINGS Brain: No evidence of acute infarct, hemorrhage, mass, mass effect, or midline shift. No hydrocephalus or extra-axial fluid collection. Periventricular white matter changes, likely the sequela of chronic small vessel ischemic disease. Vascular: No hyperdense vessel. Skull: Negative for fracture or focal lesion. Sinuses/Orbits: No acute finding. Other: The mastoid air cells are well aerated. CTA NECK FINDINGS Aortic arch: Standard branching. Imaged portion shows no evidence of aneurysm or dissection. No significant stenosis of the major arch vessel origins. Mild aortic atherosclerosis. Right carotid system: No evidence of dissection, occlusion, or hemodynamically significant stenosis (greater than 50%). Retropharyngeal course of the mid right ICA Left carotid system: Occlusion of the left ICA just distal to its take-off (series 12, image 117 and series 11, image 116). No other significant stenosis  in the opacified portion of the left carotid. No evidence of dissection. Vertebral arteries: No evidence of dissection, occlusion, or hemodynamically significant stenosis (greater than 50%). Skeleton: No acute osseous abnormality. Degenerative changes in the cervical spine. Other neck: Negative. Upper chest: 3 mm subsolid nodule in the right upper lobe (series 11, image 34). Emphysema. Review of the MIP images confirms the above findings CTA HEAD FINDINGS Anterior circulation: Nonopacification of the left ICA until the distal cavernous segment, where there is minimal opacification, with more robust opacification in the supraclinoid segment, likely retrograde. There is opacification the ophthalmic artery. The right internal carotid artery is patent to the terminus without significant stenosis. A1 segments patent, hypoplastic on the left. Normal anterior  communicating artery. Anterior cerebral arteries are patent to their distal aspects without significant stenosis. No M1 stenosis or occlusion. MCA branches perfused to their distal aspects without significant stenosis. Posterior circulation: Vertebral arteries patent to the vertebrobasilar junction without significant stenosis. Posterior inferior cerebellar arteries patent proximally. Basilar patent to its distal aspect without significant stenosis. Superior cerebellar arteries patent proximally. Patent left P1. Fetal origin of the right PCA from the right posterior communicating artery. PCAs perfused to their distal aspects without significant stenosis. The bilateral posterior communicating arteries are patent. Venous sinuses: As permitted by contrast timing, patent. Anatomic variants: Fetal origin of the right PCA. Review of the MIP images confirms the above findings IMPRESSION: 1. 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 No other intracranial large vessel occlusion or significant stenosis. 3. No hemodynamically significant stenosis in the neck. 4. 3 mm subsolid nodule in the right upper lobe. No follow-up needed if patient is low-risk.This recommendation follows the consensus statement: Guidelines for Management of Incidental Pulmonary Nodules Detected on CT Images: From the Fleischner Society 2017; Radiology 2017; 284:228-243. 5. Aortic atherosclerosis. Aortic Atherosclerosis (ICD10-I70.0). These results were called by telephone at the time of interpretation on 02/06/2023 at 5:23 pm to provider [facility] , who verbally acknowledged these results. Electronically Signed   By: Wiliam Ke M.D.   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 Does the need for close, 24 hr/day medical supervision in concert with the patient's rehab needs make it unreasonable for this patient to be served in a less intensive setting? Yes Co-Morbidities requiring supervision/potential complications:  -Atrial fibrillation with RVR -Bladder cancer with hematuria -Hypertension Due to bladder management, bowel management, safety, skin/wound care, disease management, medication administration, pain management, and patient education, does the patient require 24 hr/day rehab nursing? Yes Does the patient require coordinated care of a physician, rehab nurse, therapy disciplines of PT, OT, SLP to address physical and functional deficits in the context of the above medical diagnosis(es)? Yes Addressing deficits in the following areas: balance, endurance, locomotion, strength, transferring, bowel/bladder control, bathing, dressing, feeding, grooming, toileting, cognition, speech, language, swallowing, and psychosocial support Can the patient actively participate in an intensive therapy program of at least 3 hrs of therapy per day at least 5 days per week? Yes and Potentially The potential for patient to make measurable gains while on inpatient rehab is excellent Anticipated functional outcomes upon discharge from inpatient rehab are supervision and min assist  with PT, supervision and min assist with OT,  supervision with SLP. Estimated rehab length of stay to reach the above functional goals is: 17-24 days Anticipated discharge destination: Home Overall Rehab/Functional Prognosis: excellent  POST ACUTE RECOMMENDATIONS: This patient's condition is appropriate for continued rehabilitative care in the following setting: CIR Patient has agreed to participate in recommended program. N/A Note that insurance prior authorization may be required for reimbursement for recommended care.  Comment:  Pt was very active PTA. Has supportive daughter and husband. Rehab Admissions Coordinator to follow up for ongoing functional and medical progress.    MEDICAL RECOMMENDATIONS: Emerging tone RUE and RLE: recommend PRAFO, WHO to maintain proper positioning. I have ordered.  Discussed with husband and daughter ways to stimulate her and to engage her. Reviewed some basic ROM concepts as well.    I have personally performed a face to face diagnostic evaluation of this patient. Additionally, I have examined the patient's medical record including any pertinent labs and radiographic images. If the physician assistant has documented in this note, I have reviewed and edited or otherwise concur with the physician assistant's documentation.  Thanks,  Ranelle Oyster, MD 02/08/2023

## 2023-02-08 NOTE — Progress Notes (Signed)
Referring Provider(s): Srishti Bhagat  Supervising Physician: Julieanne Cotton  Patient Status:  [facility] - In-pt  Chief Complaint:  Transient left eye vision loss; Left ICA occlusion s/p revascularization via thrombectomy and stent-assisted angioplasty 02/07/23 with Dr. Corliss Skains   Brief History:  Loretta Gates is a 87 y.o. female with atrial fibrillation on Eliquis , hypertension, bladder cancer s/p TURBT (02/05/2023), heart failure, chronic venous insufficiency, left breast cancer s/p chemotherapy and mastectomy (2008), remote head/neck radiation (1940s at age 10), left thyroidectomy and right inferior parathyroidectomy, anemia, mild neuropathy   Last Sunday she had an episode of vision changes of the right eye that lasted 45 seconds.  She had 2 additional spells on Tuesday.  She presented to the ED at [facility] and evaluated by video neurology.  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  She underwent intervention by Dr. Corliss Skains =  Complete revascularization of the left internal carotid artery with 1 pass with complex aspiration, and a 4 mm x 40 mm Solitaire X retrieval device.   Status post near complete revascularization of the M2 segment of the inferior division, with 1 pass with a 4 mm x 30 mm Solitaire X scleral device i.e. complex aspiration, and 1 pass with a 38 Socrates aspiration catheter achieving aTICI 2C revascularization.   Status post stent assisted angioplasty of symptomatic atherosclerotic stenosis of the proximal left ICA with proximal flow arrest.  Post CT of the brain demonstrates mild Lt perisylvian hyperattenuation ?SAH   CT scan early this am showed new area of parenchymal hemorrhage in the left temporal lobe.  She is currently on Brilinta 90 mg BID.  Subjective:  Was given 2 mg Ativan yesterday for anxiety prior to MRI. Still groggy.   Allergies: Bee venom, Levaquin [levofloxacin], and Peanut-containing drug  products  Medications: Prior to Admission medications   Medication Sig Start Date End Date Taking? Authorizing Provider  apixaban (ELIQUIS) 5 MG TABS tablet TAKE ONE TABLET BY MOUTH TWICE A DAY 12/14/22  Yes Copland, Gwenlyn Found, MD  cholestyramine (QUESTRAN) 4 GM/DOSE powder Take 1 packet (4 g total) by mouth See admin instructions. Mix 1 teaspoon in water and take at 6pm daily with warfarin. Patient taking differently: Take 4 g by mouth See admin instructions. Mix 1 teaspoon in water and take daily 11/06/22  Yes Copland, Gwenlyn Found, MD  cinacalcet (SENSIPAR) 30 MG tablet Take 30 mg by mouth daily with breakfast.   Yes [provider]  denosumab (PROLIA) 60 MG/ML SOSY injection Inject 60 mg into the skin every 6 (six) months.   Yes [provider]  empagliflozin (JARDIANCE) 10 MG TABS tablet TAKE ONE TABLET BY MOUTH ONE TIME DAILY Patient taking differently: Take 10 mg by mouth daily. 12/14/22  Yes Copland, Gwenlyn Found, MD  ergocalciferol (VITAMIN D2) 1.25 MG (50000 UT) capsule Take 50,000 Units by mouth every 14 (fourteen) days. Every other saturday   Yes [provider]  furosemide (LASIX) 20 MG tablet Alternate taking 20 mg po daily with taking 30 mg po daily ever other day Patient taking differently: Take 20 mg by mouth as directed. Alternate taking 20 mg po daily with taking 30 mg po daily ever other day 01/10/23  Yes Copland, Gwenlyn Found, MD  irbesartan (AVAPRO) 300 MG tablet Take 1 tablet (300 mg total) by mouth daily. Patient taking differently: Take 300 mg by mouth daily. 05/29/22  Yes Copland, Gwenlyn Found, MD  Magnesium Oxide -Mg Supplement 500 MG TABS  Take 500 mg by mouth daily.   Yes [provider]  metoprolol tartrate (LOPRESSOR) 50 MG tablet Take 1.5 tablets (75 mg total) by mouth 2 (two) times daily. 11/20/22  Yes Copland, Gwenlyn Found, MD  omeprazole (PRILOSEC) 40 MG capsule Take 1 capsule (40 mg total) by mouth 2 (two) times daily before lunch and supper. Patient  taking differently: Take 40 mg by mouth in the morning and at bedtime. 12/25/22  Yes Copland, Gwenlyn Found, MD  oxybutynin (DITROPAN XL) 15 MG 24 hr tablet Take 1 tablet (15 mg total) by mouth at bedtime. Patient taking differently: Take 15 mg by mouth at bedtime. 05/05/22  Yes Copland, Gwenlyn Found, MD  traMADol (ULTRAM) 50 MG tablet Take 1 tablet (50 mg total) by mouth every 6 (six) hours as needed. Patient not taking: Reported on 02/06/2023 02/05/23 02/05/24  Noel Christmas, MD     Vital Signs: BP (!) 121/98   Pulse (!) 104   Temp 97.7 F (36.5 C) (Axillary)   Resp (!) 32   Ht 4\' 11"  (1.499 m)   Wt 184 lb (83.5 kg)   SpO2 96%   BMI 37.16 kg/m   Physical Exam Very sleepy, struggles to open eyes. Follows commands. Does not move right arm well. Left arm good ROM Can move toes both feet. Groin access ok.  Labs:  CBC: Recent Labs    12/01/22 1625 12/22/22 1308 02/05/23 0949 02/06/23 1532 02/07/23 0128  WBC 10.4 9.2  --  13.6* 13.0*  HGB 13.5 15.0 16.3* 13.9 13.8  HCT 42.2 46.6 48.0* 44.4 43.8  PLT 305 292  --  244 224    COAGS: Recent Labs    10/20/22 0959 10/21/22 0016 10/22/22 0026 10/22/22 1213  INR 3.2* 3.0* 2.4* 1.7*    BMP: Recent Labs    10/22/22 0026 10/23/22 0018 10/25/22 1028 12/01/22 1625 12/22/22 1308 02/05/23 0949 02/06/23 1532 02/07/23 0128  NA 139 139   < > 141 141 142 138 141  K 3.6 4.3   < > 4.4 4.7 4.3 4.1 4.2  CL 105 106   < > 105 104 102 105 111  CO2 27 26   < > 26 21  --  26 22  GLUCOSE 118* 125*   < > 103* 94 111* 100* 149*  BUN 18 21   < > 20 19 25* 24* 15  CALCIUM 10.2 10.6*   < > 11.3* 11.3*  --  8.9 8.4*  CREATININE 0.95 1.08*   < > 0.87 0.81 0.80 0.88 0.72  GFRNONAA 59* 51*  --   --   --   --  >60 >60   < > = values in this interval not displayed.    LIVER FUNCTION TESTS: Recent Labs    03/10/22 1011 07/12/22 1141 10/20/22 0959 12/01/22 1625 02/06/23 1532  BILITOT 0.5 0.7 0.9 0.9 0.8  AST 20 25 29 28 20   ALT 26 35  39 57* 19  ALKPHOS 65 52 49  --  43  PROT 7.2 7.1 6.9 6.5 6.3*  ALBUMIN 4.5 4.5 3.8  --  3.5    Assessment and Plan:  Transient left eye vision loss; Left ICA occlusion s/p revascularization via thrombectomy and stent-assisted angioplasty 02/07/23 with Dr. Corliss Skains   CT done last night showed area of parenchymal hemorrhage which is new from prior CT.  Will decrease Brilinta to 45 mg BID - will order P2Y12.   Electronically Signed: Gwynneth Macleod, PA-C 02/08/2023, 8:56  AM    I spent a total of 25 Minutes at the the patient's bedside AND on the patient's hospital floor or unit, greater than 50% of which was counseling/coordinating care for f/u cerebral intervention.

## 2023-02-08 NOTE — Progress Notes (Signed)
  Transition of Care [employer]) Screening Note   Patient Details  Name: Loretta Gates Date of Birth: 01-01-1936   Transition of Care [facility]) CM/SW Contact:    Glennon Mac, RN Phone Number: 02/08/2023, 1:53 PM    Transition of Care Department [facility]) has reviewed patient and no TOC needs have been identified at this time. We will continue to monitor patient advancement through interdisciplinary progression rounds. If new patient transition needs arise, please place a TOC consult.  Quintella Baton, RN, BSN  Trauma/Neuro ICU Case Manager [phone]

## 2023-02-08 NOTE — Progress Notes (Addendum)
STROKE TEAM PROGRESS NOTE   INTERVAL HISTORY Is seen in her room with her daughter at the bedside.   States she has a small headache, drowsy but awakens with repeated stimulation  Still in afib, decreased brilinta today per NIR. On Sunday, she had an episode of amaurosis fugax followed by a repeat episode of this yesterday.  Of note, she is taking Eliquis for atrial fibrillation but had paused this medication for bladder surgery on 4/29.  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  As she was symptomatic, she was taken to IR for angiogram and revascularization of left carotid artery as well as occluded left M2 segment.  Post IR CT demonstrated small left subarachnoid hemorrhage versus contrast. Daughter is going to touch base with urology team. If she needs to be in and out catheterized again we will order a foley. She has had pink tinged urine.  Vitals:   02/08/23 0500 02/08/23 0600 02/08/23 0700 02/08/23 0718  BP: 105/80 (!) 99/59  (!) 121/98  Pulse: (!) 106 82 (!) 104   Resp: (!) 21 (!) 24 (!) 32   Temp:      TempSrc:      SpO2: 97% 99% 96%   Weight:      Height:       CBC:  Recent Labs  Lab 02/06/23 1532 02/07/23 0128  WBC 13.6* 13.0*  NEUTROABS 10.3* 12.3*  HGB 13.9 13.8  HCT 44.4 43.8  MCV 87.9 87.4  PLT 244 224    Basic Metabolic Panel:  Recent Labs  Lab 02/06/23 1532 02/07/23 0128  NA 138 141  K 4.1 4.2  CL 105 111  CO2 26 22  GLUCOSE 100* 149*  BUN 24* 15  CREATININE 0.88 0.72  CALCIUM 8.9 8.4*    Lipid Panel:  Recent Labs  Lab 02/07/23 0128  CHOL 122  TRIG 92  HDL 46  CHOLHDL 2.7  VLDL 18  LDLCALC 58    HgbA1c:  Recent Labs  Lab 02/07/23 0128  HGBA1C 6.2*    Urine Drug Screen:  Recent Labs  Lab 02/07/23 0132  LABOPIA NONE DETECTED  COCAINSCRNUR NONE DETECTED  LABBENZ NONE DETECTED  AMPHETMU NONE DETECTED  THCU NONE DETECTED  LABBARB NONE DETECTED     Alcohol Level No results for input(s): "ETH" in  the last 168 hours.  IMAGING past 24 hours CT HEAD WO CONTRAST ( )  Addendum Date: 02/08/2023   ADDENDUM REPORT: 02/08/2023 01:11 ADDENDUM: The area of parenchymal hemorrhage is new compared to the prior CT but was present on the interval MRI and appears stable from that exam. These findings were discussed by telephone on 02/08/2023 at 1:11 am with provider SRISHTI BHAGAT . Electronically Signed   By: Wiliam Ke M.D.   On: 02/08/2023 01:11   Result Date: 02/08/2023 CLINICAL DATA:  Stroke, follow-up EXAM: CT HEAD WITHOUT CONTRAST TECHNIQUE: Contiguous axial images were obtained from the base of the skull through the vertex without intravenous contrast. RADIATION DOSE REDUCTION: This exam was performed according to the departmental dose-optimization program which includes automated exposure control, adjustment of the mA and/or kV according to patient size and/or use of iterative reconstruction technique. COMPARISON:  02/07/2023 CT head 1:56 a.m., correlation is also made with 02/07/2023 MRI head FINDINGS: Brain: New area of parenchymal hemorrhage in the left temporal lobe, adjacent to the sylvian fissure, which measures approximately 2.0 x 1.3 x 1.5 cm (AP x TR x CC) (series 6, image 46  and series 5, image 38), with a volume of approximately 2 mL. Mild surrounding hypodensity, likely edema. Redemonstrated subarachnoid hemorrhage in the left sylvian fissure and along the left frontotemporal convexity. Hypodensity and loss of gray-white differentiation in the left posterior MCA territory (series 3, image 19), consistent with of all vein Ng infarct. No evidence of hemorrhage in this area. No significant mass effect or midline shift. No hydrocephalus. Vascular: No hyperdense vessel. Skull: Negative for fracture or focal lesion. Sinuses/Orbits: No acute finding. Other: The mastoid air cells are well aerated. IMPRESSION: 1. New area of parenchymal hemorrhage in the left temporal lobe, adjacent to the Sylvian  fissure, which measures up to 2 cm, with a volume of approximately 2 mL. Heidelberg classification 1c: PH1, hematoma within infarcted tissue, occupying <30%, no substantive mass effect. 2. Redemonstrated subarachnoid hemorrhage in the left Sylvian fissure and along the left frontotemporal convexity. 3. Hypodensity and loss of gray-white differentiation in the left posterior MCA territory, consistent with evolving acute infarct. No evidence of hemorrhage in this area. 4. No significant mass effect or midline shift. These results will be called to the ordering clinician or representative by the Radiologist Assistant, and communication documented in the PACS or [employer]. Electronically Signed: By: Wiliam Ke M.D. On: 02/08/2023 00:11   MR ANGIO HEAD WO CONTRAST  Result Date: 02/07/2023 CLINICAL DATA:  Stroke suspected EXAM: MRI HEAD WITHOUT CONTRAST MRA HEAD WITHOUT CONTRAST TECHNIQUE: Multiplanar, multi-echo pulse sequences of the brain and surrounding structures were acquired without intravenous contrast. Angiographic images of the Circle of Willis were acquired using MRA technique without intravenous contrast. COMPARISON:  CTA head and neck angiogram 02/06/2023 FINDINGS: MRI HEAD FINDINGS Brain: Acute left MCA territory infarct involving large portions of the left parietal and temporal lobes, as well as the left frontal lobe in the opercular regions. Additional small acute infarcts are also present in the left cerebellum, left occipital lobe. Redemonstrated is subarachnoid hemorrhage in the left sylvian fissure and over the posterior left frontal lobe. Sequela of moderate chronic microvascular ischemic change. No hydrocephalus. Vascular: See below Skull and upper cervical spine: Normal marrow signal. Sinuses/Orbits: Trace bilateral mastoid effusions. No middle ear effusion. Paranasal sinuses are clear. Orbits are unremarkable. Other: None. MRA HEAD FINDINGS Anterior circulation: Flow signal is now seen  in the cervical, petrous, cavernous, and supraclinoid segments of the left ICA. Vessels in the left MCA territory appear asymmetrically small in size compared to the right. There is a small filling defect of the petrous segment of the left ICA (series 9, image 52). Posterior circulation: Normal Anatomic variants: IMPRESSION: 1. Acute left MCA territory infarct involving large portions of the left parietal and temporal lobes, and to a lesser degree the left frontal lobe and insula. 2. Additional small acute infarcts are also present in the left cerebellum and the left occipital lobe, which raises the possibility for a central embolic phenomenon. 3. Redemonstrated subarachnoid hemorrhage in the left Sylvian fissure and over the posterior left frontal lobe. 4. Flow signal is now seen in the cervical, petrous, cavernous, and supraclinoid segments of the left ICA. There is a small filling defect of the petrous segment of the left ICA, which could represent residual thrombus or possibly a dissection. Consider CTA head for further evaluation. These results will be called to the ordering clinician or representative by the Radiologist Assistant, and communication documented in the PACS or [employer]. Electronically Signed   By: Lorenza Cambridge M.D.   On: 02/07/2023 15:17  MR BRAIN WO CONTRAST  Result Date: 02/07/2023 CLINICAL DATA:  Stroke suspected EXAM: MRI HEAD WITHOUT CONTRAST MRA HEAD WITHOUT CONTRAST TECHNIQUE: Multiplanar, multi-echo pulse sequences of the brain and surrounding structures were acquired without intravenous contrast. Angiographic images of the Circle of Willis were acquired using MRA technique without intravenous contrast. COMPARISON:  CTA head and neck angiogram 02/06/2023 FINDINGS: MRI HEAD FINDINGS Brain: Acute left MCA territory infarct involving large portions of the left parietal and temporal lobes, as well as the left frontal lobe in the opercular regions. Additional small acute infarcts  are also present in the left cerebellum, left occipital lobe. Redemonstrated is subarachnoid hemorrhage in the left sylvian fissure and over the posterior left frontal lobe. Sequela of moderate chronic microvascular ischemic change. No hydrocephalus. Vascular: See below Skull and upper cervical spine: Normal marrow signal. Sinuses/Orbits: Trace bilateral mastoid effusions. No middle ear effusion. Paranasal sinuses are clear. Orbits are unremarkable. Other: None. MRA HEAD FINDINGS Anterior circulation: Flow signal is now seen in the cervical, petrous, cavernous, and supraclinoid segments of the left ICA. Vessels in the left MCA territory appear asymmetrically small in size compared to the right. There is a small filling defect of the petrous segment of the left ICA (series 9, image 52). Posterior circulation: Normal Anatomic variants: IMPRESSION: 1. Acute left MCA territory infarct involving large portions of the left parietal and temporal lobes, and to a lesser degree the left frontal lobe and insula. 2. Additional small acute infarcts are also present in the left cerebellum and the left occipital lobe, which raises the possibility for a central embolic phenomenon. 3. Redemonstrated subarachnoid hemorrhage in the left Sylvian fissure and over the posterior left frontal lobe. 4. Flow signal is now seen in the cervical, petrous, cavernous, and supraclinoid segments of the left ICA. There is a small filling defect of the petrous segment of the left ICA, which could represent residual thrombus or possibly a dissection. Consider CTA head for further evaluation. These results will be called to the ordering clinician or representative by the Radiologist Assistant, and communication documented in the PACS or [employer]. Electronically Signed   By: Lorenza Cambridge M.D.   On: 02/07/2023 15:17    PHYSICAL EXAM General: Alert, well-nourished, well-developed elderly patient in no acute distress Respiratory: Regular,  unlabored respirations on room air  NEURO:  Mental Status: AA&Ox3  Speech/Language: speech is without dysarthria or aphasia.    Cranial Nerves:  II: PERRL.  Right hemianopsia III, IV, VI: Left gaze preference V: Sensation is intact to light touch on the left but absent on the right VII: Smile is symmetrical.  VIII: hearing intact to voice. IX, X: Phonation is normal.  XII: tongue is midline without fasciculations. Motor: 5/5 strength to all muscle groups tested.  Tone: is normal and bulk is normal Sensation-diminished on the right Gait- deferred   ASSESSMENT/PLAN [name] is a 87 y.o. female with history of A-fib on Eliquis, hypertension, bladder cancer status post TURBT on 4/29, CHF, chronic venous insufficiency, left breast cancer status post chemo and mastectomy, left thyroidectomy, right inferior parathyroidectomy, anemia and neuropathy.  On Sunday, she had an episode of amaurosis fugax followed by a repeat episode of this yesterday.  Of note, she is taking Eliquis for atrial fibrillation but had paused this medication for bladder surgery on 4/29.  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  As she was symptomatic, she was taken to IR for angiogram and  revascularization of left carotid artery as well as occluded left M2 segment.  Post IR CT demonstrated small left subarachnoid hemorrhage versus contrast.  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 Small vessel disease.  CTA head & neck occlusion of left ICA just distal to the takeoff, no other hemodynamically significant stenosis IR left ICA occlusion with TICI3 and left M2 nonocclusive clot with TICI2c, as well as left ICA stenting Post IR CT mild left perisylvian hyperattenuation possibly SAH MRI large  posterior left MCA infarct, small left cerebellum, left occipital lobe infarcts.  SAH in the left sylvian fissure and over the posterior left frontal lobe MRA Flow signal is now seen in the cervical, petrous, cavernous, and supraclinoid segments of the left ICA. There is a small filling defect of the petrous segment of the left ICA, which could represent residual thrombus or possibly a dissection. CT head 5/2 left temporal small ICH as well as some inferior SAH, stable.  Left posterior MCA infarct involving, stable. 2D Echo EF 55 to 60% (10/2022) LDL 58 HgbA1c 6.2 UDS negative VTE prophylaxis -SCDs Eliquis (apixaban) daily prior to admission, now on aspirin 81 mg daily and Brilinta (ticagrelor) 90->45 mg bid due to [facility]. May resume Eliquis after [facility] resolves Therapy recommendations: CIR Disposition: Pending  Atrial fibrillation Failed cardioversion Patient has history of atrial fibrillation on Eliquis Eliquis had been paused for bladder procedure Eliquis held at this time due to [facility], may resume after [facility] resolves  Hypertension Home meds: Irbesartan 300 mg daily Stable BP goal less than 160 given SAH Long-term BP goal normotensive  Lipid management Home meds: None LDL 58, goal < 70 No statin needed at this time given LDL at goal and advanced age  Other Stroke Risk Factors Advanced Age >/= 65  Obesity, Body mass index is 37.16 kg/m., BMI >/= 30 associated with increased stroke risk, recommend weight loss, diet and exercise as appropriate   Other Active Problems Bladder cancer with TURBT 02/05/2023-monitor for hematuria History of left breast cancer status post mastectomy and chemotherapy in 2008 Remote head and neck radiation in 1940s Status post left thyroidectomy Status post right inferior parathyroidectomy  Hospital day # 2  Patient seen and examined by NP/APP with MD. MD to update note as needed.   Elmer Picker, DNP, FNP-BC [facility] Pager: [phone]  ATTENDING NOTE: I reviewed above note and agree with the assessment and plan. Pt was seen and examined.   Daughter at bedside.  Patient lying in bed, received Ativan yesterday afternoon for MRI.  However continued to be drowsy sleepy since, able to answer questions however difficulty open eyes or maintain eye opening.  Orientated to place, age but not to time.  Still has right hemianopia, mild right facial droop, right upper extremity 2+/5, right lower extremity 3 -/5, seems to be some worse from yesterday.  CT overnight stable stroke, ICH and SAH.  Dr. Lynnea Maizes for recommend decrease Brilinta from 90-45 twice daily and check P2 Y12.  Continue ICU care and monitoring. Discussed with Dr. Corliss Skains.   For detailed assessment and plan, please refer to above/below as I have made changes wherever appropriate.   Marvel Plan, MD PhD Stroke Neurology 02/08/2023 1:31 PM  This patient is critically ill due to left MCA stroke, status post thrombectomy, SAH, A-fib  not on [facility] now and at significant risk of neurological worsening, death form recurrent stroke, hemorrhagic transformation, heart failure, respiratory failure, seizure. This patient's care requires constant monitoring of vital signs, hemodynamics, respiratory and cardiac monitoring, review of multiple databases, neurological assessment, discussion with family, other specialists and medical decision making of high complexity. I spent 40 minutes of neurocritical care time in the care of this patient. I had long discussion with daughter at bedside, updated pt current condition, treatment plan and potential prognosis, and answered all the questions.  She expressed understanding and appreciation. Discussed with Dr. Corliss Skains.  To contact Stroke Continuity provider, please refer to [email or web address]. After hours, contact General Neurology

## 2023-02-08 NOTE — Progress Notes (Signed)
  Inpatient Rehabilitation Admissions Coordinator   Met with patient, spouse and daughter at bedside for rehab assessment. We discussed goals and expectations of a possible CIR admit. They prefer CIR for rehab. Family can provide expected caregiver support that is recommended of Min assist level. Daughter lives with her parents and works, but can take time off to assist as needed. I await further progress with therapies before proceeding with insurance Auth for a possible CIR admit. Please call me with any questions.   Ottie Glazier, RN, MSN Rehab Admissions Coordinator [phone]

## 2023-02-09 ENCOUNTER — Inpatient Hospital Stay (HOSPITAL_COMMUNITY): Payer: Medicare HMO

## 2023-02-09 DIAGNOSIS — I639 Cerebral infarction, unspecified: Secondary | ICD-10-CM | POA: Diagnosis not present

## 2023-02-09 DIAGNOSIS — I609 Nontraumatic subarachnoid hemorrhage, unspecified: Secondary | ICD-10-CM

## 2023-02-09 DIAGNOSIS — I6389 Other cerebral infarction: Secondary | ICD-10-CM | POA: Diagnosis not present

## 2023-02-09 DIAGNOSIS — I6522 Occlusion and stenosis of left carotid artery: Secondary | ICD-10-CM | POA: Diagnosis not present

## 2023-02-09 MED ORDER — QUETIAPINE FUMARATE 25 MG PO TABS
25.0000 mg | ORAL_TABLET | Freq: Every evening | ORAL | Status: DC | PRN
  Filled 2023-02-09 (×2): qty 1

## 2023-02-09 MED ORDER — ENOXAPARIN SODIUM 40 MG/0.4ML IJ SOSY
40.0000 mg | PREFILLED_SYRINGE | INTRAMUSCULAR | Status: DC
  Administered 2023-02-09 – 2023-02-13 (×5): 40 mg via SUBCUTANEOUS
  Filled 2023-02-09 (×5): qty 0.4

## 2023-02-09 NOTE — Progress Notes (Addendum)
STROKE TEAM PROGRESS NOTE   INTERVAL HISTORY Is seen in her room with her daughter and husband at the bedside.   Worked with PT this morning and stood at the side of the bed with assist of two. Awaiting P2Y12. Remains in afib. Brilinta at 45mg  BID. CT head is stable. Transfer out of ICU today. CT head needed prior to starting eliquis early next week. CIR consult placed 5/2.   Vitals:   02/09/23 0600 02/09/23 0700 02/09/23 0800 02/09/23 0900  BP: 123/64 121/67 138/80 120/86  Pulse: 82 78 83 93  Resp: 20 (!) 25 (!) 25 16  Temp:      TempSrc:      SpO2: 92% 93% 95% (!) 72%  Weight:      Height:       CBC:  Recent Labs  Lab 02/06/23 1532 02/07/23 0128  WBC 13.6* 13.0*  NEUTROABS 10.3* 12.3*  HGB 13.9 13.8  HCT 44.4 43.8  MCV 87.9 87.4  PLT 244 224    Basic Metabolic Panel:  Recent Labs  Lab 02/06/23 1532 02/07/23 0128  NA 138 141  K 4.1 4.2  CL 105 111  CO2 26 22  GLUCOSE 100* 149*  BUN 24* 15  CREATININE 0.88 0.72  CALCIUM 8.9 8.4*    Lipid Panel:  Recent Labs  Lab 02/07/23 0128  CHOL 122  TRIG 92  HDL 46  CHOLHDL 2.7  VLDL 18  LDLCALC 58    HgbA1c:  Recent Labs  Lab 02/07/23 0128  HGBA1C 6.2*    Urine Drug Screen:  Recent Labs  Lab 02/07/23 0132  LABOPIA NONE DETECTED  COCAINSCRNUR NONE DETECTED  LABBENZ NONE DETECTED  AMPHETMU NONE DETECTED  THCU NONE DETECTED  LABBARB NONE DETECTED     Alcohol Level No results for input(s): "ETH" in the last 168 hours.  IMAGING past 24 hours CT HEAD WO CONTRAST ( )  Result Date: 02/09/2023 CLINICAL DATA:  87 year old female status post code stroke presentation on 02/06/2023 with left ICA ELVO. Treated with endovascular reperfusion. Left cerebral infarcts, predominantly the MCA territory. EXAM: CT HEAD WITHOUT CONTRAST TECHNIQUE: Contiguous axial images were obtained from the base of the skull through the vertex without intravenous contrast. RADIATION DOSE REDUCTION: This exam was performed according  to the departmental dose-optimization program which includes automated exposure control, adjustment of the mA and/or kV according to patient size and/or use of iterative reconstruction technique. COMPARISON:  Post treatment MRI and MRA 02/07/2023. Head CT 02/07/2023 and earlier. FINDINGS: Brain: Oval hyperdense intra-axial hemorrhage in the left temporal lobe underlying the middle and superior temporal gyri (coronal image 31). Hyperdense blood products are 24 x 18 x 21 mm (AP by transverse by CC), stable since 02/07/2023 estimated intra-axial volume 4-5 mL (Heidelberg classification 1c: PH1, hematoma within infarcted tissue, occupying <30%, no substantive mass effect). Small volume subarachnoid hemorrhage (Heidelberg classification 3c: Subarachnoid hemorrhage). In the left sylvian fissure has regressed. No superimposed IVH. No ventriculomegaly. Confluent cytotoxic edema in the posterior left MCA territory with only mild mass effect on the adjacent ventricle. Stable gray-white differentiation elsewhere. Basilar cisterns are patent. No new intracranial hemorrhage. No new cytotoxic edema Vascular: Calcified atherosclerosis at the skull base. Skull: No acute osseous abnormality identified. Sinuses/Orbits: Visualized paranasal sinuses and mastoids are stable and well aerated. Other: No acute orbit or scalp soft tissue finding. IMPRESSION: 1. Regression of left hemisphere subarachnoid hemorrhage. Otherwise stable since 02/07/2023 left temporal lobe intra-axial hematoma, left MCA cytotoxic edema. 2. No significant intracranial mass  effect. No new intracranial abnormality. Electronically Signed   By: Odessa Fleming M.D.   On: 02/09/2023 06:14    PHYSICAL EXAM General: Alert, well-nourished, well-developed elderly patient in no acute distress Respiratory: Regular, unlabored respirations on room air  NEURO:  Mental Status: AA&Ox3, drowsy Speech/Language: speech is without dysarthria or aphasia.    Cranial Nerves:  II:  PERRL.  Right hemianopsia III, IV, VI: Left gaze preference V: Sensation is intact to light touch on the left but absent on the right VII: Smile is symmetrical.  VIII: hearing intact to voice. IX, X: Phonation is normal.  XII: tongue is midline without fasciculations. Motor: 5/5 strength to all muscle groups tested.  Tone: is normal and bulk is normal Sensation-diminished on the right Gait- deferred   ASSESSMENT/PLAN [name] is a 87 y.o. female with history of A-fib on Eliquis, hypertension, bladder cancer status post TURBT on 4/29, CHF, chronic venous insufficiency, left breast cancer status post chemo and mastectomy, left thyroidectomy, right inferior parathyroidectomy, anemia and neuropathy.  On Sunday, she had an episode of amaurosis fugax followed by a repeat episode of this yesterday.  Of note, she is taking Eliquis for atrial fibrillation but had paused this medication for bladder surgery on 4/29.  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  As she was symptomatic, she was taken to IR for angiogram and revascularization of left carotid artery as well as occluded left M2 segment.  Post IR CT demonstrated small left subarachnoid hemorrhage versus contrast.  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 Small vessel disease.  CTA head & neck occlusion of left ICA just distal to the takeoff, no other hemodynamically significant stenosis IR left ICA occlusion with TICI3 and left M2 nonocclusive clot with TICI2c, as well as left ICA stenting Post IR CT mild left perisylvian hyperattenuation possibly SAH MRI large posterior left MCA infarct, small left cerebellum, left occipital lobe infarcts.  SAH in the left sylvian fissure and over the posterior left frontal  lobe MRA Flow signal is now seen in the cervical, petrous, cavernous, and supraclinoid segments of the left ICA. There is a small filling defect of the petrous segment of the left ICA, which could represent residual thrombus or possibly a dissection. CT head 5/2 left temporal small ICH as well as some inferior SAH, stable.  Left posterior MCA infarct involving, stable. CT Head 5/3- Regression of left hemisphere SAH. Otherwise stable since 02/07/2023 left temporal lobe intra-axial hematoma, left MCA cytotoxic edema. 2D Echo EF 55 to 60% (10/2022) LDL 58 HgbA1c 6.2 UDS negative VTE prophylaxis -SCDs Eliquis (apixaban) daily prior to admission, now on aspirin 81 mg daily and Brilinta (ticagrelor) 90->45 mg bid due to [facility]. May resume Eliquis after SAH/ICH resolves P2Y12- pending  Therapy recommendations: CIR- consult placed Disposition: Pending  Atrial fibrillation Failed cardioversion Patient has history of atrial fibrillation on Eliquis Eliquis had been paused for bladder procedure Eliquis held at this time due to [facility], may resume after SAH/ICH resolves  Hypertension Home meds: Irbesartan 300 mg daily Stable BP goal less than 160 given SAH Long-term BP goal normotensive  Lipid management Home meds: None LDL 58, goal < 70 No statin needed at this time given LDL at goal and advanced age  Other Stroke  Risk Factors Advanced Age >/= 40  Obesity, Body mass index is 37.16 kg/m., BMI >/= 30 associated with increased stroke risk, recommend weight loss, diet and exercise as appropriate   Other Active Problems Bladder cancer with TURBT 02/05/2023-monitor for hematuria Urine is straw colored History of left breast cancer status post mastectomy and chemotherapy in 2008 Remote head and neck radiation in 1940s Status post left thyroidectomy Status post right inferior parathyroidectomy  Hospital day # 3  Patient seen and examined by NP/APP with MD. MD to update note as needed.   Elmer Picker, DNP, FNP-BC [facility] Pager: [phone]  ATTENDING NOTE: I reviewed above note and agree with the assessment and plan. Pt was seen and examined.   Daughter and husband are at bedside.  Per daughter, patient more active and awake alert working with PT and OT.  Currently patient worn out and sleepy and drowsy.  She attempted to open eyes on voice deletion but difficulty opening eyes due to drowsiness.  With forced opening, eyes midline, not blinking to visual threat on the right.  Right upper extremity no significant movement on pain stimulation which was worse than yesterday, however also likely due to her drowsiness.  Right lower extremity weaker withdrawal to pain estimation than the left.  CT repeat showed stable cerebral edema and ICH/SAH. P2Y12 still pending, currently on aspirin and Brilinta 45.  Consider Brilinta dose adjustment based on P2 Y12, consider resume Eliquis once ICH/SAH resolves.  PT and OT recommend CIR.  For detailed assessment and plan, please refer to above/below as I have made changes wherever appropriate.   I had long discussion with daughter and husband at bedside, and granddaughter in law over the phone, updated pt current condition, treatment plan and potential prognosis, and answered all the questions.  They expressed understanding and appreciation.    Marvel Plan, MD PhD Stroke Neurology 02/09/2023 4:10 PM  This patient is critically ill due to left ICA occlusion status post thrombectomy, left MCA large infarct with hemorrhagic transformation and SAH A-fib now off AC and at significant risk of neurological worsening, death form recurrent stroke, hemorrhagic transformation, hematoma expansion, hydrocephalus, heart failure, seizure. This patient's care requires constant monitoring of vital signs, hemodynamics, respiratory and cardiac monitoring, review of multiple databases, neurological assessment, discussion with family, other specialists and  medical decision making of high complexity. I spent 50 minutes of neurocritical care time in the care of this patient.    To contact Stroke Continuity provider, please refer to [email or web address]. After hours, contact General Neurology

## 2023-02-09 NOTE — Progress Notes (Signed)
Inpatient Rehabilitation Admissions Coordinator   Due to patient lethargy with therapy, not yet at a level to pursue insurance Auth for possible CIR admit. We will follow.  Ottie Glazier, RN, MSN Rehab Admissions Coordinator [phone] 02/09/2023 2:21 PM

## 2023-02-09 NOTE — Progress Notes (Signed)
Referring Physician(s): Dr. Roda Shutters  Supervising Physician: Julieanne Cotton  Patient Status:  [facility] - In-pt  Chief Complaint: Amaurosis fugax  Subjective: Patient sitting up in bed.  Attempting applesauce with daughter.  Groggy, cranky.  Got seroquel overnight due to delirium.  Not moving right arm during visit today, but appears still may be sedated from meds.   Allergies: Bee venom, Levaquin [levofloxacin], and Peanut-containing drug products  Medications: Prior to Admission medications   Medication Sig Start Date End Date Taking? Authorizing Provider  apixaban (ELIQUIS) 5 MG TABS tablet TAKE ONE TABLET BY MOUTH TWICE A DAY 12/14/22  Yes Copland, Gwenlyn Found, MD  cholestyramine (QUESTRAN) 4 GM/DOSE powder Take 1 packet (4 g total) by mouth See admin instructions. Mix 1 teaspoon in water and take at 6pm daily with warfarin. Patient taking differently: Take 4 g by mouth See admin instructions. Mix 1 teaspoon in water and take daily 11/06/22  Yes Copland, Gwenlyn Found, MD  cinacalcet (SENSIPAR) 30 MG tablet Take 30 mg by mouth daily with breakfast.   Yes [provider]  denosumab (PROLIA) 60 MG/ML SOSY injection Inject 60 mg into the skin every 6 (six) months.   Yes [provider]  empagliflozin (JARDIANCE) 10 MG TABS tablet TAKE ONE TABLET BY MOUTH ONE TIME DAILY Patient taking differently: Take 10 mg by mouth daily. 12/14/22  Yes Copland, Gwenlyn Found, MD  ergocalciferol (VITAMIN D2) 1.25 MG (50000 UT) capsule Take 50,000 Units by mouth every 14 (fourteen) days. Every other saturday   Yes [provider]  furosemide (LASIX) 20 MG tablet Alternate taking 20 mg po daily with taking 30 mg po daily ever other day Patient taking differently: Take 20 mg by mouth as directed. Alternate taking 20 mg po daily with taking 30 mg po daily ever other day 01/10/23  Yes Copland, Gwenlyn Found, MD  irbesartan (AVAPRO) 300 MG tablet Take 1 tablet (300 mg total) by mouth daily. Patient  taking differently: Take 300 mg by mouth daily. 05/29/22  Yes Copland, Gwenlyn Found, MD  Magnesium Oxide -Mg Supplement 500 MG TABS Take 500 mg by mouth daily.   Yes [provider]  metoprolol tartrate (LOPRESSOR) 50 MG tablet Take 1.5 tablets (75 mg total) by mouth 2 (two) times daily. 11/20/22  Yes Copland, Gwenlyn Found, MD  omeprazole (PRILOSEC) 40 MG capsule Take 1 capsule (40 mg total) by mouth 2 (two) times daily before lunch and supper. Patient taking differently: Take 40 mg by mouth in the morning and at bedtime. 12/25/22  Yes Copland, Gwenlyn Found, MD  oxybutynin (DITROPAN XL) 15 MG 24 hr tablet Take 1 tablet (15 mg total) by mouth at bedtime. Patient taking differently: Take 15 mg by mouth at bedtime. 05/05/22  Yes Copland, Gwenlyn Found, MD  traMADol (ULTRAM) 50 MG tablet Take 1 tablet (50 mg total) by mouth every 6 (six) hours as needed. Patient not taking: Reported on 02/06/2023 02/05/23 02/05/24  Noel Christmas, MD     Vital Signs: BP 120/86   Pulse 93   Temp 98.4 F (36.9 C) (Axillary)   Resp 16   Ht 4\' 11"  (1.499 m)   Wt 184 lb (83.5 kg)   SpO2 (!) 72%   BMI 37.16 kg/m   Physical Exam NAD, alert Neuro: does not follow commands, states "leave me alone" multiple times during assessment, ongoing right upper extremity weakness.  Moving bilateral lower extremities spontaneously and in response to pain.   Imaging: CT HEAD WO CONTRAST (  )  Result Date: 02/09/2023 CLINICAL DATA:  87 year old female status post code stroke presentation on 02/06/2023 with left ICA ELVO. Treated with endovascular reperfusion. Left cerebral infarcts, predominantly the MCA territory. EXAM: CT HEAD WITHOUT CONTRAST TECHNIQUE: Contiguous axial images were obtained from the base of the skull through the vertex without intravenous contrast. RADIATION DOSE REDUCTION: This exam was performed according to the departmental dose-optimization program which includes automated exposure control, adjustment of the mA  and/or kV according to patient size and/or use of iterative reconstruction technique. COMPARISON:  Post treatment MRI and MRA 02/07/2023. Head CT 02/07/2023 and earlier. FINDINGS: Brain: Oval hyperdense intra-axial hemorrhage in the left temporal lobe underlying the middle and superior temporal gyri (coronal image 31). Hyperdense blood products are 24 x 18 x 21 mm (AP by transverse by CC), stable since 02/07/2023 estimated intra-axial volume 4-5 mL (Heidelberg classification 1c: PH1, hematoma within infarcted tissue, occupying <30%, no substantive mass effect). Small volume subarachnoid hemorrhage (Heidelberg classification 3c: Subarachnoid hemorrhage). In the left sylvian fissure has regressed. No superimposed IVH. No ventriculomegaly. Confluent cytotoxic edema in the posterior left MCA territory with only mild mass effect on the adjacent ventricle. Stable gray-white differentiation elsewhere. Basilar cisterns are patent. No new intracranial hemorrhage. No new cytotoxic edema Vascular: Calcified atherosclerosis at the skull base. Skull: No acute osseous abnormality identified. Sinuses/Orbits: Visualized paranasal sinuses and mastoids are stable and well aerated. Other: No acute orbit or scalp soft tissue finding. IMPRESSION: 1. Regression of left hemisphere subarachnoid hemorrhage. Otherwise stable since 02/07/2023 left temporal lobe intra-axial hematoma, left MCA cytotoxic edema. 2. No significant intracranial mass effect. No new intracranial abnormality. Electronically Signed   By: Odessa Fleming M.D.   On: 02/09/2023 06:14   IR PERCUTANEOUS ART THROMBECTOMY/INFUSION INTRACRANIAL INC DIAG ANGIO  Result Date: 02/08/2023 INDICATION: Crescendo left eye amaurosis fugax lasting minutes, increasing in frequency. 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 EXAM: 1. EMERGENT LARGE VESSEL OCCLUSION THROMBOLYSIS (anterior CIRCULATION) COMPARISON:   CT angiogram of the head and neck February 06, 2023. MEDICATIONS: Ancef 2 g IV antibiotic was administered within 1 hour of the procedure. ANESTHESIA/SEDATION: General anesthesia. CONTRAST:  Omnipaque 300 approximately 220 cc FLUOROSCOPY TIME:  Fluoroscopy Time: 53 minutes 30 seconds (7 6 mGy). COMPLICATIONS: None immediate. TECHNIQUE: Following a full explanation of the procedure along with the potential associated complications, an informed witnessed consent was obtained. The risks of intracranial hemorrhage of 10%, worsening neurological deficit, ventilator dependency, death and inability to revascularize were all reviewed in detail with the patient. The patient was then put under general anesthesia by the Department of Anesthesiology at [facility]. The right groin was prepped and draped in the usual sterile fashion. Thereafter using modified Seldinger technique, transfemoral access into the right common femoral artery was obtained without difficulty. Over a 0.035 inch guidewire a 125 cm 5.5 French Simmons 2 support catheter inside of an 087 95 cm balloon guide catheter was advanced to the aortic arch region, and selectively positioned in the left common carotid artery. FINDINGS: The left common carotid arteriogram demonstrates the left external carotid artery and its major branches to be widely patent. The left internal carotid artery at the bulb has a stent with no evidence of a string sign on the delayed arterial phase. No reconstitution is seen of the left internal carotid artery in the cavernous segment from the external carotid artery branches. PROCEDURE: Through the balloon guide  catheter in the left internal carotid artery bulb region, a 160 cm microcatheter was advanced over an 014 inch standard Aristotle micro guidewire with a moderate J configuration distal to the bulb. Using a torque device, the micro guidewire was gently manipulated with mild resistance through the occluded left internal  carotid artery followed by the microcatheter without resistance to the proximal cavernous segment. The guidewire was removed. Good aspiration obtained from the hub of the microcatheter. A control arteriogram performed through this demonstrated copious amounts of filling defects extending from the paraophthalmic region to the carotid bulb. Also no evidence of antegrade flow was noted in the supraclinoid left ICA or the left middle cerebral artery. The micro guidewire was then gently manipulated with a torque device and advanced into the left MCA M1 M2 region followed by the microcatheter. The guidewire was removed. Good aspiration obtained from the hub of the microcatheter which was then connected to continuous heparinized saline infusion. At this time a 4 mm x 40 mm Solitaire X retrieval device was advanced to the distal end of the microcatheter and positioned such the proximal portion of the retriever was at the left internal carotid artery terminus. At this time, a [place] sterile 4 mm x 30 mm angioplasty balloon catheter which had been prepped with 50% contrast and 50% heparinized saline infusion was advanced with the retrieval pusher wire and positioned with its distal and proximal markers adequate distant from the site of the occlusion. Control angioplasty was then performed using micro inflation syringe device via micro tubing with the balloon being inflated twice each time for approximately a minute to 4 mm. The balloon was then deflated and retrieved proximally. A control arteriogram performed through the balloon guide catheter now demonstrated revascularization of the previously occluded proximal left internal carotid artery with minimal flow noted distally. The balloon guide catheter was advanced through the proximal left internal carotid artery into its middle third. This while aspiration was performed at the hub of the balloon guide catheter. The retriever was then gently retrieved into the balloon guide  catheter as aspiration was being conducted and removed. The balloon guide catheter was retrieved just proximal to the internal carotid artery bulb. Copious amounts of clot were noted in the aspirate from the balloon guide catheter hub, and also the retriever and the Tuohy Borst. A control arteriogram performed through the balloon guide catheter now demonstrated brisk flow through the previously occluded proximal left internal carotid artery distally to the petrous, cavernous and supraclinoid ICAs. The revascularized left middle cerebral artery demonstrated a TICI 3 revascularization. Flash filling was noted in the hypoplastic stenotic proximal left A1. Attempts were made with the Embotrap filter device to be advanced to the distal cervical left ICA. Tortuosity in the mid cervical left ICA provided significant resistance to the distal advancement of the filter device catheter. This was then retrieved and removed. Over an 014 inch 300 cm exchange micro guidewire, a 6/8 mm x 40 mm Xact stent delivery apparatus which had been retrogradely purged with heparinized saline infusion was advanced using the rapid exchange technique and positioned without difficulty across the stenosis and proximal left ICA. The during the deployment of the stent, aspiration was applied at the hub of the balloon guide catheter. Following deployment, a control arteriogram performed through the balloon guide catheter demonstrated excellent apposition and coverage of the diseased proximal left ICA. A control arteriogram performed through the stented left internal carotid artery demonstrated wide patency of the left internal carotid artery extra  cranially and intracranially. However, now evident was nonocclusive filling defect within the distal M2 segment of the inferior division 055 132 Zoom aspiration catheter with an 021 160 cm microcatheter was advanced over an 018 inch Aristotle micro guidewire with a J configuration to the left middle  cerebral artery inferior division. The micro guidewire was then gently advanced through the occluded distal M2 segment followed by the microcatheter. The guidewire was removed. Good aspiration obtained from the hub of the microcatheter. Following confirmation on safe positioning of the tip of the microcatheter, a 4 mm x 40 mm Solitaire X retrieval device was then deployed in the usual manner covering the previous area of subtotal occlusion of the distal left MCA inferior division M2 segment. The Zoom aspiration catheter was advanced to the distal inferior division. Aspiration was then performed at the hub of the Zoom aspiration catheter for approximately 2 minutes, and also through the balloon guide catheter in the left common carotid artery. The combination of the retrieval device, and the Zoom aspiration catheter was then gently retrieved with the retriever being captured into the Zoom aspiration catheter as the combination was withdrawn through the stented segment. A control arteriogram performed through the balloon guide catheter following flow reversal now demonstrated significantly improved flow through the previously occluded distal M2 segment with a TICI 2C revascularization. The inferior and posterior angular branch continued to demonstrate slow flow in its proximal 1/3. At this time, a Socrates 155 cm aspiration catheter was advanced over an 018 inch Aristotle micro guidewire with a moderate J configuration to the distal left M2 inferior division segment. The guidewire was then advanced without difficulty through the occluded segment followed by the Socrates aspiration catheter. The Socrates catheter was advanced distally as the wire was withdrawn. Aspiration was then applied at the hub of the Socrates guide catheter for approximately 2 minute. Following this, the aspiration catheter was removed. Arteriogram performed through the balloon guide catheter now demonstrated the revascularized previously noted  inferior division continued to be a small non occlusive filling defects in the more distal segment. This remained stable over the next 10 minutes. A final control arteriogram performed through the balloon guide catheter in the left common carotid artery now demonstrated excellent flow through the stented left ICA proximally and through the left middle cerebral artery territory with a TICI 2C revascularization. The balloon guide catheter was removed. A 5 [place] JB 1 diagnostic catheter was then advanced over an 035 inch guidewire and positioned in the right common carotid artery and the left vertebral artery. The right common carotid arteriogram demonstrates a stenosis of 50% at the origin of the right external carotid artery. The right internal carotid artery at the bulb to the cranial skull base is widely patent. The petrous, the cavernous and the supraclinoid right ICA demonstrate wide patency. The right middle and the right anterior cerebral artery opacify into the capillary and venous phases. A right posterior communicating artery is seen opacifying the right posterior cerebral artery distribution. Cross-filling via the anterior communicating artery of the left anterior cerebral A2 segment and distally is noted. The left vertebral artery origin is widely patent. Moderate tortuosity is noted in the proximal left vertebral artery. More distally, the vessel is seen to opacify to the cranial skull base. The left vertebrobasilar junction and the left posterior-inferior cerebellar artery demonstrate opacification. The basilar artery, the superior cerebellar arteries and the left posterior cerebral arteries opacify into the capillary and venous phases. Non visualization of the right posterior  cerebral arteries due to inflow from the more dominant anterior circulation as described above. An 8 French Angio-Seal closure device was deployed for hemostasis for hemostasis at the right groin puncture site. Distal pulses  remained present bilaterally unchanged from prior to the procedure. A flat panel CT of the brain demonstrated mild subarachnoid hyperattenuation in the left perisylvian and left anterior parietal convexity. No mass effect or shift was noted. The patient's general anesthesia was reversed, and patient was extubated. Upon recovery, the patient was noted to move her left side spontaneously. Patient was able to move her right leg spontaneously, and barely able to lift her right arm. Pupils were 3 mm round regular reactive. Tongue was in the midline. The patient remained unresponsive to verbal instructions. She was then transferred to the neuro ICU for post revascularization care. The patient received 81 mg of a aspirin, and 90 mg of Brilinta in route from referring hospital. She received quite a bolus dose of cangrelor, and a quarter does infusion which was stopped after about 2 hours. 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 Status post revascularization of occluded left internal carotid artery intracranially and extra cranially with 1 pass with a a 4 mm x 40 mm Solitaire X retrieval device contact aspiration achieving a TICI 3 revascularization of the left middle cerebral artery distribution. Status post revascularization of occluded distal M2 segment of the inferior division with 1 pass with a 4 mm x 40 mm Solitaire X retrieval device, and contact aspiration, and 1 pass with contact with an Socrates aspiration catheter achieving a TICI 2C revascularization. PLAN: Follow-up in the clinic 2-4 weeks post discharge. Electronically Signed   By: Julieanne Cotton M.D.   On: 02/08/2023 10:37   IR ANGIO INTRA EXTRACRAN SEL COM CAROTID INNOMINATE UNI R MOD SED  Result Date: 02/08/2023 INDICATION: Crescendo left eye amaurosis fugax lasting minutes, increasing in frequency.  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 EXAM: 1. EMERGENT LARGE VESSEL OCCLUSION THROMBOLYSIS (anterior CIRCULATION) COMPARISON:  CT angiogram of the head and neck February 06, 2023. MEDICATIONS: Ancef 2 g IV antibiotic was administered within 1 hour of the procedure. ANESTHESIA/SEDATION: General anesthesia. CONTRAST:  Omnipaque 300 approximately 220 cc FLUOROSCOPY TIME:  Fluoroscopy Time: 53 minutes 30 seconds (7 6 mGy). COMPLICATIONS: None immediate. TECHNIQUE: Following a full explanation of the procedure along with the potential associated complications, an informed witnessed consent was obtained. The risks of intracranial hemorrhage of 10%, worsening neurological deficit, ventilator dependency, death and inability to revascularize were all reviewed in detail with the patient. The patient was then put under general anesthesia by the Department of Anesthesiology at [facility]. The right groin was prepped and draped in the usual sterile fashion. Thereafter using modified Seldinger technique, transfemoral access into the right common femoral artery was obtained without difficulty. Over a 0.035 inch guidewire a 125 cm 5.5 French Simmons 2 support catheter inside of an 087 95 cm balloon guide catheter was advanced to the aortic arch region, and selectively positioned in the left common carotid artery. FINDINGS: The left common carotid arteriogram demonstrates the left external carotid artery and its major branches to be widely patent. The left internal carotid artery at the bulb has a stent with no evidence of a string sign on the delayed arterial phase. No reconstitution is seen of  the left internal carotid artery in the cavernous segment from the external carotid artery branches. PROCEDURE: Through the balloon guide catheter in the left internal carotid artery bulb region, a 160 cm microcatheter was advanced over an 014 inch  standard Aristotle micro guidewire with a moderate J configuration distal to the bulb. Using a torque device, the micro guidewire was gently manipulated with mild resistance through the occluded left internal carotid artery followed by the microcatheter without resistance to the proximal cavernous segment. The guidewire was removed. Good aspiration obtained from the hub of the microcatheter. A control arteriogram performed through this demonstrated copious amounts of filling defects extending from the paraophthalmic region to the carotid bulb. Also no evidence of antegrade flow was noted in the supraclinoid left ICA or the left middle cerebral artery. The micro guidewire was then gently manipulated with a torque device and advanced into the left MCA M1 M2 region followed by the microcatheter. The guidewire was removed. Good aspiration obtained from the hub of the microcatheter which was then connected to continuous heparinized saline infusion. At this time a 4 mm x 40 mm Solitaire X retrieval device was advanced to the distal end of the microcatheter and positioned such the proximal portion of the retriever was at the left internal carotid artery terminus. At this time, a [place] sterile 4 mm x 30 mm angioplasty balloon catheter which had been prepped with 50% contrast and 50% heparinized saline infusion was advanced with the retrieval pusher wire and positioned with its distal and proximal markers adequate distant from the site of the occlusion. Control angioplasty was then performed using micro inflation syringe device via micro tubing with the balloon being inflated twice each time for approximately a minute to 4 mm. The balloon was then deflated and retrieved proximally. A control arteriogram performed through the balloon guide catheter now demonstrated revascularization of the previously occluded proximal left internal carotid artery with minimal flow noted distally. The balloon guide catheter was advanced  through the proximal left internal carotid artery into its middle third. This while aspiration was performed at the hub of the balloon guide catheter. The retriever was then gently retrieved into the balloon guide catheter as aspiration was being conducted and removed. The balloon guide catheter was retrieved just proximal to the internal carotid artery bulb. Copious amounts of clot were noted in the aspirate from the balloon guide catheter hub, and also the retriever and the Tuohy Borst. A control arteriogram performed through the balloon guide catheter now demonstrated brisk flow through the previously occluded proximal left internal carotid artery distally to the petrous, cavernous and supraclinoid ICAs. The revascularized left middle cerebral artery demonstrated a TICI 3 revascularization. Flash filling was noted in the hypoplastic stenotic proximal left A1. Attempts were made with the Embotrap filter device to be advanced to the distal cervical left ICA. Tortuosity in the mid cervical left ICA provided significant resistance to the distal advancement of the filter device catheter. This was then retrieved and removed. Over an 014 inch 300 cm exchange micro guidewire, a 6/8 mm x 40 mm Xact stent delivery apparatus which had been retrogradely purged with heparinized saline infusion was advanced using the rapid exchange technique and positioned without difficulty across the stenosis and proximal left ICA. The during the deployment of the stent, aspiration was applied at the hub of the balloon guide catheter. Following deployment, a control arteriogram performed through the balloon guide catheter demonstrated excellent apposition and coverage of the diseased proximal left ICA. A  control arteriogram performed through the stented left internal carotid artery demonstrated wide patency of the left internal carotid artery extra cranially and intracranially. However, now evident was nonocclusive filling defect within the  distal M2 segment of the inferior division 055 132 Zoom aspiration catheter with an 021 160 cm microcatheter was advanced over an 018 inch Aristotle micro guidewire with a J configuration to the left middle cerebral artery inferior division. The micro guidewire was then gently advanced through the occluded distal M2 segment followed by the microcatheter. The guidewire was removed. Good aspiration obtained from the hub of the microcatheter. Following confirmation on safe positioning of the tip of the microcatheter, a 4 mm x 40 mm Solitaire X retrieval device was then deployed in the usual manner covering the previous area of subtotal occlusion of the distal left MCA inferior division M2 segment. The Zoom aspiration catheter was advanced to the distal inferior division. Aspiration was then performed at the hub of the Zoom aspiration catheter for approximately 2 minutes, and also through the balloon guide catheter in the left common carotid artery. The combination of the retrieval device, and the Zoom aspiration catheter was then gently retrieved with the retriever being captured into the Zoom aspiration catheter as the combination was withdrawn through the stented segment. A control arteriogram performed through the balloon guide catheter following flow reversal now demonstrated significantly improved flow through the previously occluded distal M2 segment with a TICI 2C revascularization. The inferior and posterior angular branch continued to demonstrate slow flow in its proximal 1/3. At this time, a Socrates 155 cm aspiration catheter was advanced over an 018 inch Aristotle micro guidewire with a moderate J configuration to the distal left M2 inferior division segment. The guidewire was then advanced without difficulty through the occluded segment followed by the Socrates aspiration catheter. The Socrates catheter was advanced distally as the wire was withdrawn. Aspiration was then applied at the hub of the Socrates  guide catheter for approximately 2 minute. Following this, the aspiration catheter was removed. Arteriogram performed through the balloon guide catheter now demonstrated the revascularized previously noted inferior division continued to be a small non occlusive filling defects in the more distal segment. This remained stable over the next 10 minutes. A final control arteriogram performed through the balloon guide catheter in the left common carotid artery now demonstrated excellent flow through the stented left ICA proximally and through the left middle cerebral artery territory with a TICI 2C revascularization. The balloon guide catheter was removed. A 5 [place] JB 1 diagnostic catheter was then advanced over an 035 inch guidewire and positioned in the right common carotid artery and the left vertebral artery. The right common carotid arteriogram demonstrates a stenosis of 50% at the origin of the right external carotid artery. The right internal carotid artery at the bulb to the cranial skull base is widely patent. The petrous, the cavernous and the supraclinoid right ICA demonstrate wide patency. The right middle and the right anterior cerebral artery opacify into the capillary and venous phases. A right posterior communicating artery is seen opacifying the right posterior cerebral artery distribution. Cross-filling via the anterior communicating artery of the left anterior cerebral A2 segment and distally is noted. The left vertebral artery origin is widely patent. Moderate tortuosity is noted in the proximal left vertebral artery. More distally, the vessel is seen to opacify to the cranial skull base. The left vertebrobasilar junction and the left posterior-inferior cerebellar artery demonstrate opacification. The basilar artery, the superior cerebellar  arteries and the left posterior cerebral arteries opacify into the capillary and venous phases. Non visualization of the right posterior cerebral arteries due to  inflow from the more dominant anterior circulation as described above. An 8 French Angio-Seal closure device was deployed for hemostasis for hemostasis at the right groin puncture site. Distal pulses remained present bilaterally unchanged from prior to the procedure. A flat panel CT of the brain demonstrated mild subarachnoid hyperattenuation in the left perisylvian and left anterior parietal convexity. No mass effect or shift was noted. The patient's general anesthesia was reversed, and patient was extubated. Upon recovery, the patient was noted to move her left side spontaneously. Patient was able to move her right leg spontaneously, and barely able to lift her right arm. Pupils were 3 mm round regular reactive. Tongue was in the midline. The patient remained unresponsive to verbal instructions. She was then transferred to the neuro ICU for post revascularization care. The patient received 81 mg of a aspirin, and 90 mg of Brilinta in route from referring hospital. She received quite a bolus dose of cangrelor, and a quarter does infusion which was stopped after about 2 hours. 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 Status post revascularization of occluded left internal carotid artery intracranially and extra cranially with 1 pass with a a 4 mm x 40 mm Solitaire X retrieval device contact aspiration achieving a TICI 3 revascularization of the left middle cerebral artery distribution. Status post revascularization of occluded distal M2 segment of the inferior division with 1 pass with a 4 mm x 40 mm Solitaire X retrieval device, and contact aspiration, and 1 pass with contact with an Socrates aspiration catheter achieving a TICI 2C revascularization. PLAN: Follow-up in the clinic 2-4 weeks post discharge. Electronically Signed   By: Julieanne Cotton M.D.   On:  02/08/2023 10:37   IR ANGIO VERTEBRAL SEL SUBCLAVIAN INNOMINATE UNI L MOD SED  Result Date: 02/08/2023 INDICATION: Crescendo left eye amaurosis fugax lasting minutes, increasing in frequency. 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 EXAM: 1. EMERGENT LARGE VESSEL OCCLUSION THROMBOLYSIS (anterior CIRCULATION) COMPARISON:  CT angiogram of the head and neck February 06, 2023. MEDICATIONS: Ancef 2 g IV antibiotic was administered within 1 hour of the procedure. ANESTHESIA/SEDATION: General anesthesia. CONTRAST:  Omnipaque 300 approximately 220 cc FLUOROSCOPY TIME:  Fluoroscopy Time: 53 minutes 30 seconds (7 6 mGy). COMPLICATIONS: None immediate. TECHNIQUE: Following a full explanation of the procedure along with the potential associated complications, an informed witnessed consent was obtained. The risks of intracranial hemorrhage of 10%, worsening neurological deficit, ventilator dependency, death and inability to revascularize were all reviewed in detail with the patient. The patient was then put under general anesthesia by the Department of Anesthesiology at [facility]. The right groin was prepped and draped in the usual sterile fashion. Thereafter using modified Seldinger technique, transfemoral access into the right common femoral artery was obtained without difficulty. Over a 0.035 inch guidewire a 125 cm 5.5 French Simmons 2 support catheter inside of an 087 95 cm balloon guide catheter was advanced to the aortic arch region, and selectively positioned in the left common carotid artery. FINDINGS: The left common carotid arteriogram demonstrates the left external carotid artery and its major branches to be widely patent. The left internal carotid artery at the bulb has a  stent with no evidence of a string sign on the delayed arterial phase. No reconstitution is seen of the left internal carotid artery in the cavernous  segment from the external carotid artery branches. PROCEDURE: Through the balloon guide catheter in the left internal carotid artery bulb region, a 160 cm microcatheter was advanced over an 014 inch standard Aristotle micro guidewire with a moderate J configuration distal to the bulb. Using a torque device, the micro guidewire was gently manipulated with mild resistance through the occluded left internal carotid artery followed by the microcatheter without resistance to the proximal cavernous segment. The guidewire was removed. Good aspiration obtained from the hub of the microcatheter. A control arteriogram performed through this demonstrated copious amounts of filling defects extending from the paraophthalmic region to the carotid bulb. Also no evidence of antegrade flow was noted in the supraclinoid left ICA or the left middle cerebral artery. The micro guidewire was then gently manipulated with a torque device and advanced into the left MCA M1 M2 region followed by the microcatheter. The guidewire was removed. Good aspiration obtained from the hub of the microcatheter which was then connected to continuous heparinized saline infusion. At this time a 4 mm x 40 mm Solitaire X retrieval device was advanced to the distal end of the microcatheter and positioned such the proximal portion of the retriever was at the left internal carotid artery terminus. At this time, a [place] sterile 4 mm x 30 mm angioplasty balloon catheter which had been prepped with 50% contrast and 50% heparinized saline infusion was advanced with the retrieval pusher wire and positioned with its distal and proximal markers adequate distant from the site of the occlusion. Control angioplasty was then performed using micro inflation syringe device via micro tubing with the balloon being inflated twice each time for approximately a minute to 4 mm. The balloon was then deflated and retrieved proximally. A control arteriogram performed through the  balloon guide catheter now demonstrated revascularization of the previously occluded proximal left internal carotid artery with minimal flow noted distally. The balloon guide catheter was advanced through the proximal left internal carotid artery into its middle third. This while aspiration was performed at the hub of the balloon guide catheter. The retriever was then gently retrieved into the balloon guide catheter as aspiration was being conducted and removed. The balloon guide catheter was retrieved just proximal to the internal carotid artery bulb. Copious amounts of clot were noted in the aspirate from the balloon guide catheter hub, and also the retriever and the Tuohy Borst. A control arteriogram performed through the balloon guide catheter now demonstrated brisk flow through the previously occluded proximal left internal carotid artery distally to the petrous, cavernous and supraclinoid ICAs. The revascularized left middle cerebral artery demonstrated a TICI 3 revascularization. Flash filling was noted in the hypoplastic stenotic proximal left A1. Attempts were made with the Embotrap filter device to be advanced to the distal cervical left ICA. Tortuosity in the mid cervical left ICA provided significant resistance to the distal advancement of the filter device catheter. This was then retrieved and removed. Over an 014 inch 300 cm exchange micro guidewire, a 6/8 mm x 40 mm Xact stent delivery apparatus which had been retrogradely purged with heparinized saline infusion was advanced using the rapid exchange technique and positioned without difficulty across the stenosis and proximal left ICA. The during the deployment of the stent, aspiration was applied at the hub of the balloon guide catheter. Following deployment, a control arteriogram  performed through the balloon guide catheter demonstrated excellent apposition and coverage of the diseased proximal left ICA. A control arteriogram performed through the  stented left internal carotid artery demonstrated wide patency of the left internal carotid artery extra cranially and intracranially. However, now evident was nonocclusive filling defect within the distal M2 segment of the inferior division 055 132 Zoom aspiration catheter with an 021 160 cm microcatheter was advanced over an 018 inch Aristotle micro guidewire with a J configuration to the left middle cerebral artery inferior division. The micro guidewire was then gently advanced through the occluded distal M2 segment followed by the microcatheter. The guidewire was removed. Good aspiration obtained from the hub of the microcatheter. Following confirmation on safe positioning of the tip of the microcatheter, a 4 mm x 40 mm Solitaire X retrieval device was then deployed in the usual manner covering the previous area of subtotal occlusion of the distal left MCA inferior division M2 segment. The Zoom aspiration catheter was advanced to the distal inferior division. Aspiration was then performed at the hub of the Zoom aspiration catheter for approximately 2 minutes, and also through the balloon guide catheter in the left common carotid artery. The combination of the retrieval device, and the Zoom aspiration catheter was then gently retrieved with the retriever being captured into the Zoom aspiration catheter as the combination was withdrawn through the stented segment. A control arteriogram performed through the balloon guide catheter following flow reversal now demonstrated significantly improved flow through the previously occluded distal M2 segment with a TICI 2C revascularization. The inferior and posterior angular branch continued to demonstrate slow flow in its proximal 1/3. At this time, a Socrates 155 cm aspiration catheter was advanced over an 018 inch Aristotle micro guidewire with a moderate J configuration to the distal left M2 inferior division segment. The guidewire was then advanced without difficulty  through the occluded segment followed by the Socrates aspiration catheter. The Socrates catheter was advanced distally as the wire was withdrawn. Aspiration was then applied at the hub of the Socrates guide catheter for approximately 2 minute. Following this, the aspiration catheter was removed. Arteriogram performed through the balloon guide catheter now demonstrated the revascularized previously noted inferior division continued to be a small non occlusive filling defects in the more distal segment. This remained stable over the next 10 minutes. A final control arteriogram performed through the balloon guide catheter in the left common carotid artery now demonstrated excellent flow through the stented left ICA proximally and through the left middle cerebral artery territory with a TICI 2C revascularization. The balloon guide catheter was removed. A 5 [place] JB 1 diagnostic catheter was then advanced over an 035 inch guidewire and positioned in the right common carotid artery and the left vertebral artery. The right common carotid arteriogram demonstrates a stenosis of 50% at the origin of the right external carotid artery. The right internal carotid artery at the bulb to the cranial skull base is widely patent. The petrous, the cavernous and the supraclinoid right ICA demonstrate wide patency. The right middle and the right anterior cerebral artery opacify into the capillary and venous phases. A right posterior communicating artery is seen opacifying the right posterior cerebral artery distribution. Cross-filling via the anterior communicating artery of the left anterior cerebral A2 segment and distally is noted. The left vertebral artery origin is widely patent. Moderate tortuosity is noted in the proximal left vertebral artery. More distally, the vessel is seen to opacify to the cranial skull base.  The left vertebrobasilar junction and the left posterior-inferior cerebellar artery demonstrate opacification. The  basilar artery, the superior cerebellar arteries and the left posterior cerebral arteries opacify into the capillary and venous phases. Non visualization of the right posterior cerebral arteries due to inflow from the more dominant anterior circulation as described above. An 8 French Angio-Seal closure device was deployed for hemostasis for hemostasis at the right groin puncture site. Distal pulses remained present bilaterally unchanged from prior to the procedure. A flat panel CT of the brain demonstrated mild subarachnoid hyperattenuation in the left perisylvian and left anterior parietal convexity. No mass effect or shift was noted. The patient's general anesthesia was reversed, and patient was extubated. Upon recovery, the patient was noted to move her left side spontaneously. Patient was able to move her right leg spontaneously, and barely able to lift her right arm. Pupils were 3 mm round regular reactive. Tongue was in the midline. The patient remained unresponsive to verbal instructions. She was then transferred to the neuro ICU for post revascularization care. The patient received 81 mg of a aspirin, and 90 mg of Brilinta in route from referring hospital. She received quite a bolus dose of cangrelor, and a quarter does infusion which was stopped after about 2 hours. 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 Status post revascularization of occluded left internal carotid artery intracranially and extra cranially with 1 pass with a a 4 mm x 40 mm Solitaire X retrieval device contact aspiration achieving a TICI 3 revascularization of the left middle cerebral artery distribution. Status post revascularization of occluded distal M2 segment of the inferior division with 1 pass with a 4 mm x 40 mm Solitaire X retrieval device, and contact aspiration, and 1 pass with  contact with an Socrates aspiration catheter achieving a TICI 2C revascularization. PLAN: Follow-up in the clinic 2-4 weeks post discharge. Electronically Signed   By: Julieanne Cotton M.D.   On: 02/08/2023 10:37   IR CT Head Ltd  Result Date: 02/08/2023 INDICATION: Crescendo left eye amaurosis fugax lasting minutes, increasing in frequency. 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 EXAM: 1. EMERGENT LARGE VESSEL OCCLUSION THROMBOLYSIS (anterior CIRCULATION) COMPARISON:  CT angiogram of the head and neck February 06, 2023. MEDICATIONS: Ancef 2 g IV antibiotic was administered within 1 hour of the procedure. ANESTHESIA/SEDATION: General anesthesia. CONTRAST:  Omnipaque 300 approximately 220 cc FLUOROSCOPY TIME:  Fluoroscopy Time: 53 minutes 30 seconds (7 6 mGy). COMPLICATIONS: None immediate. TECHNIQUE: Following a full explanation of the procedure along with the potential associated complications, an informed witnessed consent was obtained. The risks of intracranial hemorrhage of 10%, worsening neurological deficit, ventilator dependency, death and inability to revascularize were all reviewed in detail with the patient. The patient was then put under general anesthesia by the Department of Anesthesiology at [facility]. The right groin was prepped and draped in the usual sterile fashion. Thereafter using modified Seldinger technique, transfemoral access into the right common femoral artery was obtained without difficulty. Over a 0.035 inch guidewire a 125 cm 5.5 French Simmons 2 support catheter inside of an 087 95 cm balloon guide catheter was advanced to the aortic arch region, and selectively positioned in the left common carotid artery. FINDINGS: The left common carotid arteriogram demonstrates the left external carotid artery and its major branches to be  widely patent. The left internal carotid artery at the bulb has a  stent with no evidence of a string sign on the delayed arterial phase. No reconstitution is seen of the left internal carotid artery in the cavernous segment from the external carotid artery branches. PROCEDURE: Through the balloon guide catheter in the left internal carotid artery bulb region, a 160 cm microcatheter was advanced over an 014 inch standard Aristotle micro guidewire with a moderate J configuration distal to the bulb. Using a torque device, the micro guidewire was gently manipulated with mild resistance through the occluded left internal carotid artery followed by the microcatheter without resistance to the proximal cavernous segment. The guidewire was removed. Good aspiration obtained from the hub of the microcatheter. A control arteriogram performed through this demonstrated copious amounts of filling defects extending from the paraophthalmic region to the carotid bulb. Also no evidence of antegrade flow was noted in the supraclinoid left ICA or the left middle cerebral artery. The micro guidewire was then gently manipulated with a torque device and advanced into the left MCA M1 M2 region followed by the microcatheter. The guidewire was removed. Good aspiration obtained from the hub of the microcatheter which was then connected to continuous heparinized saline infusion. At this time a 4 mm x 40 mm Solitaire X retrieval device was advanced to the distal end of the microcatheter and positioned such the proximal portion of the retriever was at the left internal carotid artery terminus. At this time, a [place] sterile 4 mm x 30 mm angioplasty balloon catheter which had been prepped with 50% contrast and 50% heparinized saline infusion was advanced with the retrieval pusher wire and positioned with its distal and proximal markers adequate distant from the site of the occlusion. Control angioplasty was then performed using micro inflation syringe device via micro tubing with the balloon being inflated twice  each time for approximately a minute to 4 mm. The balloon was then deflated and retrieved proximally. A control arteriogram performed through the balloon guide catheter now demonstrated revascularization of the previously occluded proximal left internal carotid artery with minimal flow noted distally. The balloon guide catheter was advanced through the proximal left internal carotid artery into its middle third. This while aspiration was performed at the hub of the balloon guide catheter. The retriever was then gently retrieved into the balloon guide catheter as aspiration was being conducted and removed. The balloon guide catheter was retrieved just proximal to the internal carotid artery bulb. Copious amounts of clot were noted in the aspirate from the balloon guide catheter hub, and also the retriever and the Tuohy Borst. A control arteriogram performed through the balloon guide catheter now demonstrated brisk flow through the previously occluded proximal left internal carotid artery distally to the petrous, cavernous and supraclinoid ICAs. The revascularized left middle cerebral artery demonstrated a TICI 3 revascularization. Flash filling was noted in the hypoplastic stenotic proximal left A1. Attempts were made with the Embotrap filter device to be advanced to the distal cervical left ICA. Tortuosity in the mid cervical left ICA provided significant resistance to the distal advancement of the filter device catheter. This was then retrieved and removed. Over an 014 inch 300 cm exchange micro guidewire, a 6/8 mm x 40 mm Xact stent delivery apparatus which had been retrogradely purged with heparinized saline infusion was advanced using the rapid exchange technique and positioned without difficulty across the stenosis and proximal left ICA. The during the deployment of the stent, aspiration was applied at  the hub of the balloon guide catheter. Following deployment, a control arteriogram performed through the  balloon guide catheter demonstrated excellent apposition and coverage of the diseased proximal left ICA. A control arteriogram performed through the stented left internal carotid artery demonstrated wide patency of the left internal carotid artery extra cranially and intracranially. However, now evident was nonocclusive filling defect within the distal M2 segment of the inferior division 055 132 Zoom aspiration catheter with an 021 160 cm microcatheter was advanced over an 018 inch Aristotle micro guidewire with a J configuration to the left middle cerebral artery inferior division. The micro guidewire was then gently advanced through the occluded distal M2 segment followed by the microcatheter. The guidewire was removed. Good aspiration obtained from the hub of the microcatheter. Following confirmation on safe positioning of the tip of the microcatheter, a 4 mm x 40 mm Solitaire X retrieval device was then deployed in the usual manner covering the previous area of subtotal occlusion of the distal left MCA inferior division M2 segment. The Zoom aspiration catheter was advanced to the distal inferior division. Aspiration was then performed at the hub of the Zoom aspiration catheter for approximately 2 minutes, and also through the balloon guide catheter in the left common carotid artery. The combination of the retrieval device, and the Zoom aspiration catheter was then gently retrieved with the retriever being captured into the Zoom aspiration catheter as the combination was withdrawn through the stented segment. A control arteriogram performed through the balloon guide catheter following flow reversal now demonstrated significantly improved flow through the previously occluded distal M2 segment with a TICI 2C revascularization. The inferior and posterior angular branch continued to demonstrate slow flow in its proximal 1/3. At this time, a Socrates 155 cm aspiration catheter was advanced over an 018 inch Aristotle  micro guidewire with a moderate J configuration to the distal left M2 inferior division segment. The guidewire was then advanced without difficulty through the occluded segment followed by the Socrates aspiration catheter. The Socrates catheter was advanced distally as the wire was withdrawn. Aspiration was then applied at the hub of the Socrates guide catheter for approximately 2 minute. Following this, the aspiration catheter was removed. Arteriogram performed through the balloon guide catheter now demonstrated the revascularized previously noted inferior division continued to be a small non occlusive filling defects in the more distal segment. This remained stable over the next 10 minutes. A final control arteriogram performed through the balloon guide catheter in the left common carotid artery now demonstrated excellent flow through the stented left ICA proximally and through the left middle cerebral artery territory with a TICI 2C revascularization. The balloon guide catheter was removed. A 5 [place] JB 1 diagnostic catheter was then advanced over an 035 inch guidewire and positioned in the right common carotid artery and the left vertebral artery. The right common carotid arteriogram demonstrates a stenosis of 50% at the origin of the right external carotid artery. The right internal carotid artery at the bulb to the cranial skull base is widely patent. The petrous, the cavernous and the supraclinoid right ICA demonstrate wide patency. The right middle and the right anterior cerebral artery opacify into the capillary and venous phases. A right posterior communicating artery is seen opacifying the right posterior cerebral artery distribution. Cross-filling via the anterior communicating artery of the left anterior cerebral A2 segment and distally is noted. The left vertebral artery origin is widely patent. Moderate tortuosity is noted in the proximal left vertebral artery. More  distally, the vessel is seen to  opacify to the cranial skull base. The left vertebrobasilar junction and the left posterior-inferior cerebellar artery demonstrate opacification. The basilar artery, the superior cerebellar arteries and the left posterior cerebral arteries opacify into the capillary and venous phases. Non visualization of the right posterior cerebral arteries due to inflow from the more dominant anterior circulation as described above. An 8 French Angio-Seal closure device was deployed for hemostasis for hemostasis at the right groin puncture site. Distal pulses remained present bilaterally unchanged from prior to the procedure. A flat panel CT of the brain demonstrated mild subarachnoid hyperattenuation in the left perisylvian and left anterior parietal convexity. No mass effect or shift was noted. The patient's general anesthesia was reversed, and patient was extubated. Upon recovery, the patient was noted to move her left side spontaneously. Patient was able to move her right leg spontaneously, and barely able to lift her right arm. Pupils were 3 mm round regular reactive. Tongue was in the midline. The patient remained unresponsive to verbal instructions. She was then transferred to the neuro ICU for post revascularization care. The patient received 81 mg of a aspirin, and 90 mg of Brilinta in route from referring hospital. She received quite a bolus dose of cangrelor, and a quarter does infusion which was stopped after about 2 hours. 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 Status post revascularization of occluded left internal carotid artery intracranially and extra cranially with 1 pass with a a 4 mm x 40 mm Solitaire X retrieval device contact aspiration achieving a TICI 3 revascularization of the left middle cerebral artery distribution. Status post revascularization of  occluded distal M2 segment of the inferior division with 1 pass with a 4 mm x 40 mm Solitaire X retrieval device, and contact aspiration, and 1 pass with contact with an Socrates aspiration catheter achieving a TICI 2C revascularization. PLAN: Follow-up in the clinic 2-4 weeks post discharge. Electronically Signed   By: Julieanne Cotton M.D.   On: 02/08/2023 10:37   CT HEAD WO CONTRAST ( )  Addendum Date: 02/08/2023   ADDENDUM REPORT: 02/08/2023 01:11 ADDENDUM: The area of parenchymal hemorrhage is new compared to the prior CT but was present on the interval MRI and appears stable from that exam. These findings were discussed by telephone on 02/08/2023 at 1:11 am with provider SRISHTI BHAGAT . Electronically Signed   By: Wiliam Ke M.D.   On: 02/08/2023 01:11   Result Date: 02/08/2023 CLINICAL DATA:  Stroke, follow-up EXAM: CT HEAD WITHOUT CONTRAST TECHNIQUE: Contiguous axial images were obtained from the base of the skull through the vertex without intravenous contrast. RADIATION DOSE REDUCTION: This exam was performed according to the departmental dose-optimization program which includes automated exposure control, adjustment of the mA and/or kV according to patient size and/or use of iterative reconstruction technique. COMPARISON:  02/07/2023 CT head 1:56 a.m., correlation is also made with 02/07/2023 MRI head FINDINGS: Brain: New area of parenchymal hemorrhage in the left temporal lobe, adjacent to the sylvian fissure, which measures approximately 2.0 x 1.3 x 1.5 cm (AP x TR x CC) (series 6, image 46 and series 5, image 38), with a volume of approximately 2 mL. Mild surrounding hypodensity, likely edema. Redemonstrated subarachnoid hemorrhage in the left sylvian fissure and along the left frontotemporal convexity. Hypodensity and loss of gray-white differentiation in the left posterior MCA territory (series 3, image 19), consistent with  of all vein Ng infarct. No evidence of hemorrhage in this area. No  significant mass effect or midline shift. No hydrocephalus. Vascular: No hyperdense vessel. Skull: Negative for fracture or focal lesion. Sinuses/Orbits: No acute finding. Other: The mastoid air cells are well aerated. IMPRESSION: 1. New area of parenchymal hemorrhage in the left temporal lobe, adjacent to the Sylvian fissure, which measures up to 2 cm, with a volume of approximately 2 mL. Heidelberg classification 1c: PH1, hematoma within infarcted tissue, occupying <30%, no substantive mass effect. 2. Redemonstrated subarachnoid hemorrhage in the left Sylvian fissure and along the left frontotemporal convexity. 3. Hypodensity and loss of gray-white differentiation in the left posterior MCA territory, consistent with evolving acute infarct. No evidence of hemorrhage in this area. 4. No significant mass effect or midline shift. These results will be called to the ordering clinician or representative by the Radiologist Assistant, and communication documented in the PACS or [employer]. Electronically Signed: By: Wiliam Ke M.D. On: 02/08/2023 00:11   MR ANGIO HEAD WO CONTRAST  Result Date: 02/07/2023 CLINICAL DATA:  Stroke suspected EXAM: MRI HEAD WITHOUT CONTRAST MRA HEAD WITHOUT CONTRAST TECHNIQUE: Multiplanar, multi-echo pulse sequences of the brain and surrounding structures were acquired without intravenous contrast. Angiographic images of the Circle of Willis were acquired using MRA technique without intravenous contrast. COMPARISON:  CTA head and neck angiogram 02/06/2023 FINDINGS: MRI HEAD FINDINGS Brain: Acute left MCA territory infarct involving large portions of the left parietal and temporal lobes, as well as the left frontal lobe in the opercular regions. Additional small acute infarcts are also present in the left cerebellum, left occipital lobe. Redemonstrated is subarachnoid hemorrhage in the left sylvian fissure and over the posterior left frontal lobe. Sequela of moderate chronic  microvascular ischemic change. No hydrocephalus. Vascular: See below Skull and upper cervical spine: Normal marrow signal. Sinuses/Orbits: Trace bilateral mastoid effusions. No middle ear effusion. Paranasal sinuses are clear. Orbits are unremarkable. Other: None. MRA HEAD FINDINGS Anterior circulation: Flow signal is now seen in the cervical, petrous, cavernous, and supraclinoid segments of the left ICA. Vessels in the left MCA territory appear asymmetrically small in size compared to the right. There is a small filling defect of the petrous segment of the left ICA (series 9, image 52). Posterior circulation: Normal Anatomic variants: IMPRESSION: 1. Acute left MCA territory infarct involving large portions of the left parietal and temporal lobes, and to a lesser degree the left frontal lobe and insula. 2. Additional small acute infarcts are also present in the left cerebellum and the left occipital lobe, which raises the possibility for a central embolic phenomenon. 3. Redemonstrated subarachnoid hemorrhage in the left Sylvian fissure and over the posterior left frontal lobe. 4. Flow signal is now seen in the cervical, petrous, cavernous, and supraclinoid segments of the left ICA. There is a small filling defect of the petrous segment of the left ICA, which could represent residual thrombus or possibly a dissection. Consider CTA head for further evaluation. These results will be called to the ordering clinician or representative by the Radiologist Assistant, and communication documented in the PACS or [employer]. Electronically Signed   By: Lorenza Cambridge M.D.   On: 02/07/2023 15:17   MR BRAIN WO CONTRAST  Result Date: 02/07/2023 CLINICAL DATA:  Stroke suspected EXAM: MRI HEAD WITHOUT CONTRAST MRA HEAD WITHOUT CONTRAST TECHNIQUE: Multiplanar, multi-echo pulse sequences of the brain and surrounding structures were acquired without intravenous contrast. Angiographic images of the Circle of Willis were  acquired  using MRA technique without intravenous contrast. COMPARISON:  CTA head and neck angiogram 02/06/2023 FINDINGS: MRI HEAD FINDINGS Brain: Acute left MCA territory infarct involving large portions of the left parietal and temporal lobes, as well as the left frontal lobe in the opercular regions. Additional small acute infarcts are also present in the left cerebellum, left occipital lobe. Redemonstrated is subarachnoid hemorrhage in the left sylvian fissure and over the posterior left frontal lobe. Sequela of moderate chronic microvascular ischemic change. No hydrocephalus. Vascular: See below Skull and upper cervical spine: Normal marrow signal. Sinuses/Orbits: Trace bilateral mastoid effusions. No middle ear effusion. Paranasal sinuses are clear. Orbits are unremarkable. Other: None. MRA HEAD FINDINGS Anterior circulation: Flow signal is now seen in the cervical, petrous, cavernous, and supraclinoid segments of the left ICA. Vessels in the left MCA territory appear asymmetrically small in size compared to the right. There is a small filling defect of the petrous segment of the left ICA (series 9, image 52). Posterior circulation: Normal Anatomic variants: IMPRESSION: 1. Acute left MCA territory infarct involving large portions of the left parietal and temporal lobes, and to a lesser degree the left frontal lobe and insula. 2. Additional small acute infarcts are also present in the left cerebellum and the left occipital lobe, which raises the possibility for a central embolic phenomenon. 3. Redemonstrated subarachnoid hemorrhage in the left Sylvian fissure and over the posterior left frontal lobe. 4. Flow signal is now seen in the cervical, petrous, cavernous, and supraclinoid segments of the left ICA. There is a small filling defect of the petrous segment of the left ICA, which could represent residual thrombus or possibly a dissection. Consider CTA head for further evaluation. These results will be called  to the ordering clinician or representative by the Radiologist Assistant, and communication documented in the PACS or [employer]. Electronically Signed   By: Lorenza Cambridge M.D.   On: 02/07/2023 15:17   CT HEAD WO CONTRAST ( )  Result Date: 02/07/2023 CLINICAL DATA:  Stroke follow-up EXAM: CT HEAD WITHOUT CONTRAST TECHNIQUE: Contiguous axial images were obtained from the base of the skull through the vertex without intravenous contrast. RADIATION DOSE REDUCTION: This exam was performed according to the departmental dose-optimization program which includes automated exposure control, adjustment of the mA and/or kV according to patient size and/or use of iterative reconstruction technique. COMPARISON:  Angiography 02/06/2023 FINDINGS: Brain: There is acute subarachnoid hemorrhage in the left sylvian fissure and over the left convexity. There is periventricular hypoattenuation compatible with chronic microvascular disease. Vascular: No abnormal hyperdensity of the major intracranial arteries or dural venous sinuses. No intracranial atherosclerosis. Skull: The visualized skull base, calvarium and extracranial soft tissues are normal. Sinuses/Orbits: No fluid levels or advanced mucosal thickening of the visualized paranasal sinuses. No mastoid or middle ear effusion. The orbits are normal. IMPRESSION: Acute subarachnoid hemorrhage in the left Sylvian fissure and over the left convexity. Unchanged compared to the intraprocedural CT obtained during the earlier procedure. Electronically Signed   By: Deatra Robinson M.D.   On: 02/07/2023 02:11   CT ANGIO HEAD NECK W WO CM  Result Date: 02/06/2023 CLINICAL DATA:  Transient monocular vision loss in the left RI EXAM: CT ANGIOGRAPHY HEAD AND NECK WITH AND WITHOUT CONTRAST TECHNIQUE: Multidetector CT imaging of the head and neck was performed using the standard protocol during bolus administration of intravenous contrast. Multiplanar CT image reconstructions and MIPs  were obtained to evaluate the vascular anatomy. Carotid stenosis measurements (when applicable) are obtained utilizing NASCET  criteria, using the distal internal carotid diameter as the denominator. RADIATION DOSE REDUCTION: This exam was performed according to the departmental dose-optimization program which includes automated exposure control, adjustment of the mA and/or kV according to patient size and/or use of iterative reconstruction technique. CONTRAST:  75mL OMNIPAQUE IOHEXOL 350 MG/ML SOLN COMPARISON:  None Available. FINDINGS: CT HEAD FINDINGS Brain: No evidence of acute infarct, hemorrhage, mass, mass effect, or midline shift. No hydrocephalus or extra-axial fluid collection. Periventricular white matter changes, likely the sequela of chronic small vessel ischemic disease. Vascular: No hyperdense vessel. Skull: Negative for fracture or focal lesion. Sinuses/Orbits: No acute finding. Other: The mastoid air cells are well aerated. CTA NECK FINDINGS Aortic arch: Standard branching. Imaged portion shows no evidence of aneurysm or dissection. No significant stenosis of the major arch vessel origins. Mild aortic atherosclerosis. Right carotid system: No evidence of dissection, occlusion, or hemodynamically significant stenosis (greater than 50%). Retropharyngeal course of the mid right ICA Left carotid system: Occlusion of the left ICA just distal to its take-off (series 12, image 117 and series 11, image 116). No other significant stenosis in the opacified portion of the left carotid. No evidence of dissection. Vertebral arteries: No evidence of dissection, occlusion, or hemodynamically significant stenosis (greater than 50%). Skeleton: No acute osseous abnormality. Degenerative changes in the cervical spine. Other neck: Negative. Upper chest: 3 mm subsolid nodule in the right upper lobe (series 11, image 34). Emphysema. Review of the MIP images confirms the above findings CTA HEAD FINDINGS Anterior  circulation: Nonopacification of the left ICA until the distal cavernous segment, where there is minimal opacification, with more robust opacification in the supraclinoid segment, likely retrograde. There is opacification the ophthalmic artery. The right internal carotid artery is patent to the terminus without significant stenosis. A1 segments patent, hypoplastic on the left. Normal anterior communicating artery. Anterior cerebral arteries are patent to their distal aspects without significant stenosis. No M1 stenosis or occlusion. MCA branches perfused to their distal aspects without significant stenosis. Posterior circulation: Vertebral arteries patent to the vertebrobasilar junction without significant stenosis. Posterior inferior cerebellar arteries patent proximally. Basilar patent to its distal aspect without significant stenosis. Superior cerebellar arteries patent proximally. Patent left P1. Fetal origin of the right PCA from the right posterior communicating artery. PCAs perfused to their distal aspects without significant stenosis. The bilateral posterior communicating arteries are patent. Venous sinuses: As permitted by contrast timing, patent. Anatomic variants: Fetal origin of the right PCA. Review of the MIP images confirms the above findings IMPRESSION: 1. 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 No other intracranial large vessel occlusion or significant stenosis. 3. No hemodynamically significant stenosis in the neck. 4. 3 mm subsolid nodule in the right upper lobe. No follow-up needed if patient is low-risk.This recommendation follows the consensus statement: Guidelines for Management of Incidental Pulmonary Nodules Detected on CT Images: From the Fleischner Society 2017; Radiology  2017; 284:228-243. 5. Aortic atherosclerosis. Aortic Atherosclerosis (ICD10-I70.0). These results were called by telephone at the time of interpretation on 02/06/2023 at 5:23 pm to provider [facility] , who verbally acknowledged these results. Electronically Signed   By: Wiliam Ke M.D.   On: 02/06/2023 17:24    Labs:  CBC: Recent Labs    12/01/22 1625 12/22/22 1308 02/05/23 0949 02/06/23 1532 02/07/23 0128  WBC 10.4  9.2  --  13.6* 13.0*  HGB 13.5 15.0 16.3* 13.9 13.8  HCT 42.2 46.6 48.0* 44.4 43.8  PLT 305 292  --  244 224    COAGS: Recent Labs    10/20/22 0959 10/21/22 0016 10/22/22 0026 10/22/22 1213  INR 3.2* 3.0* 2.4* 1.7*    BMP: Recent Labs    10/22/22 0026 10/23/22 0018 10/25/22 1028 12/01/22 1625 12/22/22 1308 02/05/23 0949 02/06/23 1532 02/07/23 0128  NA 139 139   < > 141 141 142 138 141  K 3.6 4.3   < > 4.4 4.7 4.3 4.1 4.2  CL 105 106   < > 105 104 102 105 111  CO2 27 26   < > 26 21  --  26 22  GLUCOSE 118* 125*   < > 103* 94 111* 100* 149*  BUN 18 21   < > 20 19 25* 24* 15  CALCIUM 10.2 10.6*   < > 11.3* 11.3*  --  8.9 8.4*  CREATININE 0.95 1.08*   < > 0.87 0.81 0.80 0.88 0.72  GFRNONAA 59* 51*  --   --   --   --  >60 >60   < > = values in this interval not displayed.    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 Procedure complicated by small left subarachnoid hemorrhage  Follow-up CT showed parenchymal hemorrhage in the left temporal lobe with redemonstration of subarachnoid hemorrhage.  CT also repeated overnight with no acute changes.   Patient assessed at bedside this  AM.  She is groggy, cranky, poorly attentive and focused.  Difficult to assess neuro fx particularly on the right due to fatigue, medication (was given seroquel overnight).   Continue to monitor.  Continue Brilinta 45mg  BID for now.  P2Y12 pending.   Electronically Signed: Hoyt Koch, PA 02/09/2023, 10:23 AM   I spent a total of 15 Minutes at the the patient's bedside AND on the patient's hospital floor or unit, greater than 50% of which was counseling/coordinating care for left ICA occlusion, subarachnoid hemorrhage

## 2023-02-09 NOTE — Progress Notes (Signed)
Physical Therapy Treatment Patient Details Name: Loretta Gates MRN: [id number] DOB: 11/22/1935 Today's Date: 02/09/2023   History of Present Illness Loretta Gates is a 87 y.o. female who presenting 4/30 with transient changes in vision. 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 PMH: atrial fibrillation on Eliquis (last dose 4/30 a.m. s/p failed cardioversion 1 to 2 months ago), hypertension bladder cancer s/p TURBT (02/05/2023), heart failure, chronic venous insufficiency, left breast cancer s/p chemotherapy and mastectomy (2008), remote head/neck radiation (1940s at age 37), left thyroidectomy and right inferior parathyroidectomy, anemia, mild neuropathy    PT Comments    The pt presented with continued lethargy, but did respond intermittently to max stimulation and encouragement. Pt demos poor insight to deficits and poor awareness, stating that she is "fine" and just wants to be asleep throughout session. However, the pt did demo good initiation with BLE to move to EOB and to complete initial sit-stands from EOB with maxA of 2. The pt did fatigue after 3 sit-stands at EOB, needing totalA of 2 rather than max. After she was returned to bed, the pt began perseverating on going to the bathroom, she did use purewick but continued to ask to get OOB to [facility] and became increasingly restless. The pt was then assisted to [facility], but was dependent on totalA of 2 for stand-pivot. Will continue to benefit from skilled PT to progress functional strength, activity tolerance, balance, and awareness.     Recommendations for follow up therapy are one component of a multi-disciplinary discharge planning process, led by the attending physician.  Recommendations may be updated based on patient status, additional functional criteria and insurance authorization.  Follow Up Recommendations       Assistance Recommended at Discharge Frequent or constant Supervision/Assistance  Patient can  return home with the following A little help with walking and/or transfers;A little help with bathing/dressing/bathroom;Assist for transportation;Help with stairs or ramp for entrance   Equipment Recommendations  None recommended by PT (TBD)    Recommendations for Other Services       Precautions / Restrictions Precautions Precautions: Fall Precaution Comments: decreased right visual and physical attention Restrictions Weight Bearing Restrictions: No     Mobility  Bed Mobility Overal bed mobility: Needs Assistance Bed Mobility: Supine to Sit, Sit to Supine     Supine to sit: Max assist, +2 for physical assistance Sit to supine: Total assist   General bed mobility comments: pt needing repeated cues for hand placement, assist with all extremities and sequential cues. unable to assist with return to supine    Transfers Overall transfer level: Needs assistance Equipment used: 2 person hand held assist Transfers: Sit to/from Stand, Bed to chair/wheelchair/BSC Sit to Stand: Max assist, Total assist, +2 physical assistance Stand pivot transfers: Total assist, +2 physical assistance         General transfer comment: pt initially standing with maxA of 2, completed x3 from EOB and then returned to supine. pt then perseverating on needing to go to toilet and was assisted using stand-pivot to [facility]. at this time pt with no attempt to assist and was dependent transfer of 2 to and from [facility]    Ambulation/Gait               General Gait Details: pt unable to step to [facility], no steps noted, and dependent transfer.      Balance Overall balance assessment: Needs assistance Sitting-balance support: Feet supported, No upper extremity supported Sitting  balance-Leahy Scale: Poor Sitting balance - Comments: needed constant hands-on to maintain static sitting EOB, pt with head forwards and did not lift or open eyes to voice (VSS, suspect lethargy) Postural control: Right lateral lean,  Posterior lean Standing balance support: Bilateral upper extremity supported, During functional activity Standing balance-Leahy Scale: Poor Standing balance comment: dependent on therapists. max-totalA                            Cognition Arousal/Alertness: Lethargic Behavior During Therapy: Flat affect, Agitated Overall Cognitive Status: Impaired/Different from baseline Area of Impairment: Attention, Memory, Following commands, Safety/judgement, Awareness, Problem solving, Orientation                 Orientation Level: Disoriented to, Situation Current Attention Level: Focused Memory: Decreased short-term memory Following Commands: Follows one step commands with increased time Safety/Judgement: Decreased awareness of deficits, Decreased awareness of safety   Problem Solving: Slow processing, Requires tactile cues, Requires verbal cues General Comments: pt lethargic and unable to maintain attention through session. answering questions intermittently with max stimulation. unable to answer orientation questions. states she is "fine" multiple times and that she just wants to be asleep. After end of session pt perseverating on need to go to the bathroom with no awareness of use of purewick and could not be redirected        Exercises      General Comments General comments (skin integrity, edema, etc.): VSS onRA      Pertinent Vitals/Pain Pain Assessment Pain Assessment: Faces Faces Pain Scale: No hurt Pain Location: agitated when wet, no indication of pain Pain Intervention(s): Monitored during session     PT Goals (current goals can now be found in the care plan section) Acute Rehab PT Goals Patient Stated Goal: feel better PT Goal Formulation: With patient/family Time For Goal Achievement: 02/21/23 Potential to Achieve Goals: Fair Progress towards PT goals: Progressing toward goals    Frequency    Min 4X/week      PT Plan Current plan remains  appropriate    Co-evaluation PT/OT/SLP Co-Evaluation/Treatment: Yes Reason for Co-Treatment: For patient/therapist safety;To address functional/ADL transfers PT goals addressed during session: Mobility/safety with mobility;Balance;Strengthening/ROM        AM-PAC PT "6 Clicks" Mobility   Outcome Measure  Help needed turning from your back to your side while in a flat bed without using bedrails?: A Lot Help needed moving from lying on your back to sitting on the side of a flat bed without using bedrails?: A Lot Help needed moving to and from a bed to a chair (including a wheelchair)?: Total Help needed standing up from a chair using your arms (e.g., wheelchair or bedside chair)?: Total Help needed to walk in hospital room?: Total Help needed climbing 3-5 steps with a railing? : Total 6 Click Score: 8    End of Session Equipment Utilized During Treatment: Gait belt Activity Tolerance: Patient limited by fatigue;Patient tolerated treatment well Patient left: in bed;with call bell/phone within reach;with bed alarm set;with family/visitor present Nurse Communication: Mobility status PT Visit Diagnosis: Unsteadiness on feet (R26.81);Muscle weakness (generalized) (M62.81);Other symptoms and signs involving the nervous system (Z61.096)     Time: 0454-0981 PT Time Calculation (min) (ACUTE ONLY): 55 min  Charges:  $Therapeutic Exercise: 23-37 mins                     Vickki Muff, PT, DPT   Acute Rehabilitation Department Office [phone]  Secure Chat Communication Preferred   Ronnie Derby 02/09/2023, 10:10 AM

## 2023-02-09 NOTE — Progress Notes (Signed)
Occupational Therapy Treatment Patient Details Name: Loretta Gates MRN: [id number] DOB: 1935/11/30 Today's Date: 02/09/2023   History of present illness Loretta Gates is a 87 y.o. female who presenting 4/30 with transient changes in vision. 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 PMH: atrial fibrillation on Eliquis (last dose 4/30 a.m. s/p failed cardioversion 1 to 2 months ago), hypertension bladder cancer s/p TURBT (02/05/2023), heart failure, chronic venous insufficiency, left breast cancer s/p chemotherapy and mastectomy (2008), remote head/neck radiation (1940s at age 86), left thyroidectomy and right inferior parathyroidectomy, anemia, mild neuropathy   OT comments  Patient remains lethargic for OT treament, requiring max stimulation or orient and alert. Patient sitting EOB with OT and PT with max A of 2 to transition EOB. Patient fatiguing after 3 sit-stands at EOB, needing totalA of 2 rather than max A of 2. After she was returned to bed, the pt began perseverating on going to the bathroom, she did use purewick but continued to ask to get OOB to [facility] and became increasingly restless. The pt was then assisted to [facility], but was dependent on totalA of 2 for stand-pivot. Patient would continue to benefit from post acute rehab.    Recommendations for follow up therapy are one component of a multi-disciplinary discharge planning process, led by the attending physician.  Recommendations may be updated based on patient status, additional functional criteria and insurance authorization.    Assistance Recommended at Discharge Frequent or constant Supervision/Assistance  Patient can return home with the following  Two people to help with walking and/or transfers;Two people to help with bathing/dressing/bathroom;Assistance with cooking/housework;Help with stairs or ramp for entrance;Assistance with feeding;Assist for transportation;Direct supervision/assist for financial  management;Direct supervision/assist for medications management   Equipment Recommendations  Other (comment) (Will continue to assess)    Recommendations for Other Services      Precautions / Restrictions Precautions Precautions: Fall Precaution Comments: decreased right visual and physical attention Restrictions Weight Bearing Restrictions: No       Mobility Bed Mobility Overal bed mobility: Needs Assistance Bed Mobility: Supine to Sit, Sit to Supine     Supine to sit: Max assist, +2 for physical assistance Sit to supine: Total assist   General bed mobility comments: pt needing repeated cues for hand placement, assist with all extremities and sequential cues. unable to assist with return to supine    Transfers Overall transfer level: Needs assistance Equipment used: 2 person hand held assist Transfers: Sit to/from Stand, Bed to chair/wheelchair/BSC Sit to Stand: Max assist, Total assist, +2 physical assistance Stand pivot transfers: Total assist, +2 physical assistance         General transfer comment: pt initially standing with maxA of 2, completed x3 from EOB and then returned to supine. pt then perseverating on needing to go to toilet and was assisted using stand-pivot to [facility]. at this time pt with no attempt to assist and was dependent transfer of 2 to and from [facility]     Balance Overall balance assessment: Needs assistance Sitting-balance support: Feet supported, No upper extremity supported Sitting balance-Leahy Scale: Poor Sitting balance - Comments: needed constant hands-on to maintain static sitting EOB, pt with head forwards and did not lift or open eyes to voice (VSS, suspect lethargy) Postural control: Right lateral lean, Posterior lean Standing balance support: Bilateral upper extremity supported, During functional activity Standing balance-Leahy Scale: Poor Standing balance comment: dependent on therapists. max-totalA  ADL either performed or assessed with clinical judgement   ADL Overall ADL's : Needs assistance/impaired     Grooming: Wash/dry hands;Wash/dry face;Maximal assistance;Sitting Grooming Details (indicate cue type and reason): hand over hand assist for thoroughness             Lower Body Dressing: Total assistance;Bed level   Toilet Transfer: Total assistance;+2 for physical assistance;+2 for safety/equipment;Stand-pivot;Cueing for sequencing;Cueing for safety Toilet Transfer Details (indicate cue type and reason): Total A of 2 to transfer Toileting- Clothing Manipulation and Hygiene: Total assistance;+2 for physical assistance;+2 for safety/equipment;Sitting/lateral lean;Sit to/from stand Toileting - Clothing Manipulation Details (indicate cue type and reason): RN assisting with peri-care     Functional mobility during ADLs: Total assistance;+2 for physical assistance;+2 for safety/equipment;Cueing for safety;Cueing for sequencing General ADL Comments: Patient session focus on increasing activity tolerance, overall cognition, and participation in ADL tasks.    Extremity/Trunk Assessment              Vision   Additional Comments: did not keep eyes open long enough to further assess   Perception Perception Perception: Impaired   Praxis Praxis Praxis: Impaired Praxis Impairment Details: Motor planning    Cognition Arousal/Alertness: Lethargic Behavior During Therapy: Flat affect, Agitated Overall Cognitive Status: Impaired/Different from baseline Area of Impairment: Attention, Memory, Following commands, Safety/judgement, Awareness, Problem solving, Orientation                 Orientation Level: Disoriented to, Situation Current Attention Level: Focused Memory: Decreased short-term memory Following Commands: Follows one step commands with increased time Safety/Judgement: Decreased awareness of deficits, Decreased awareness of safety   Problem Solving: Slow  processing, Requires tactile cues, Requires verbal cues General Comments: pt lethargic and unable to maintain attention through session. answering questions intermittently with max stimulation. unable to answer orientation questions. states she is "fine" multiple times and that she just wants to be asleep. After end of session pt perseverating on need to go to the bathroom with no awareness of use of purewick and could not be redirected        Exercises      Shoulder Instructions       General Comments VSS on RA    Pertinent Vitals/ Pain       Pain Assessment Pain Assessment: Faces Faces Pain Scale: No hurt Pain Location: agitated when wet, no indication of pain Pain Intervention(s): Limited activity within patient's tolerance, Monitored during session, Repositioned  Home Living                                          Prior Functioning/Environment              Frequency  Min 2X/week        Progress Toward Goals  OT Goals(current goals can now be found in the care plan section)  Progress towards OT goals: Progressing toward goals  Acute Rehab OT Goals Patient Stated Goal: to go to the bathroom OT Goal Formulation: Patient unable to participate in goal setting Time For Goal Achievement: 02/21/23 Potential to Achieve Goals: Good  Plan Discharge plan remains appropriate    Co-evaluation      Reason for Co-Treatment: For patient/therapist safety;To address functional/ADL transfers PT goals addressed during session: Mobility/safety with mobility;Balance;Strengthening/ROM        AM-PAC OT "6 Clicks" Daily Activity     Outcome Measure   Help  from another person eating meals?: A Lot Help from another person taking care of personal grooming?: A Lot Help from another person toileting, which includes using toliet, bedpan, or urinal?: Total Help from another person bathing (including washing, rinsing, drying)?: Total Help from another person to  put on and taking off regular upper body clothing?: A Lot Help from another person to put on and taking off regular lower body clothing?: Total 6 Click Score: 9    End of Session Equipment Utilized During Treatment: Gait belt  OT Visit Diagnosis: Muscle weakness (generalized) (M62.81);Ataxia, unspecified (R27.0);Other symptoms and signs involving cognitive function;Hemiplegia and hemiparesis Hemiplegia - Right/Left: Right Hemiplegia - dominant/non-dominant: Dominant Hemiplegia - caused by: Nontraumatic SAH   Activity Tolerance Patient limited by fatigue;Patient limited by lethargy   Patient Left in bed;with call bell/phone within reach;with bed alarm set;with family/visitor present   Nurse Communication Mobility status        Time: 1610-9604 OT Time Calculation (min): 55 min  Charges: OT General Charges $OT Visit: 1 Visit OT Treatments $Self Care/Home Management : 23-37 mins  Pollyann Glen E. Kyle Stansell, OTR/L Acute Rehabilitation Services [phone]   Cherlyn Cushing 02/09/2023, 1:16 PM

## 2023-02-09 NOTE — Plan of Care (Signed)
  Problem: Pain Managment: Goal: General experience of comfort will improve Outcome: Progressing   Problem: Activity: Goal: Risk for activity intolerance will decrease Outcome: Not Progressing   Problem: Nutrition: Goal: Adequate nutrition will be maintained Outcome: Not Progressing   Problem: Skin Integrity: Goal: Risk for impaired skin integrity will decrease Outcome: Not Progressing   

## 2023-02-10 DIAGNOSIS — I6522 Occlusion and stenosis of left carotid artery: Secondary | ICD-10-CM | POA: Diagnosis not present

## 2023-02-10 LAB — CBC
HCT: 41.6 % (ref 36.0–46.0)
Hemoglobin: 13.4 g/dL (ref 12.0–15.0)
MCH: 27.7 pg (ref 26.0–34.0)
MCHC: 32.2 g/dL (ref 30.0–36.0)
MCV: 86 fL (ref 80.0–100.0)
Platelets: 238 10*3/uL (ref 150–400)
RBC: 4.84 MIL/uL (ref 3.87–5.11)
RDW: 15.7 % — ABNORMAL HIGH (ref 11.5–15.5)
WBC: 10.1 10*3/uL (ref 4.0–10.5)
nRBC: 0 % (ref 0.0–0.2)

## 2023-02-10 LAB — BASIC METABOLIC PANEL
Anion gap: 12 (ref 5–15)
BUN: 13 mg/dL (ref 8–23)
CO2: 19 mmol/L — ABNORMAL LOW (ref 22–32)
Calcium: 9.3 mg/dL (ref 8.9–10.3)
Chloride: 106 mmol/L (ref 98–111)
Creatinine, Ser: 0.59 mg/dL (ref 0.44–1.00)
GFR, Estimated: >60 mL/min (ref >60)
Glucose, Bld: 108 mg/dL — ABNORMAL HIGH (ref 70–99)
Potassium: 3.8 mmol/L (ref 3.5–5.1)
Sodium: 137 mmol/L (ref 135–145)

## 2023-02-10 MED ORDER — OXYBUTYNIN CHLORIDE ER 5 MG PO TB24
15.0000 mg | ORAL_TABLET | Freq: Every day | ORAL | Status: DC
  Administered 2023-02-10 – 2023-02-12 (×3): 15 mg via ORAL
  Filled 2023-02-10 (×3): qty 3

## 2023-02-10 MED ORDER — CHOLESTYRAMINE 4 GM/DOSE PO POWD
4.0000 g | Freq: Every day | ORAL | Status: DC
  Administered 2023-02-10: 4 g via ORAL
  Filled 2023-02-10 (×2): qty 1

## 2023-02-10 NOTE — Progress Notes (Signed)
Pt has chronic a-fib. Telemetry reported non-sustaining HR elevation into the 140's to160's overnight.

## 2023-02-10 NOTE — Progress Notes (Addendum)
STROKE TEAM PROGRESS NOTE   INTERVAL HISTORY Patient is seen in her room with 2 family members at the bedside.  She has been hemodynamically stable overnight, and her neurological exam is stable.  She was able to get up to the bedside commode and had a large bowel movement today.  She is awaiting evaluation for CIR. Neurological exam is unchanged with left gaze preference and right hemiplegia and hemianopsia.  Vitals:   02/10/23 0135 02/10/23 0340 02/10/23 0804 02/10/23 0820  BP: 130/86 118/71 137/76   Pulse: 75 81 97   Resp: 20 20 20    Temp: 98 F (36.7 C) 98.4 F (36.9 C) 97.9 F (36.6 C)   TempSrc: Axillary Axillary Oral   SpO2: 97% 98% 95% 95%  Weight:      Height:       CBC:  Recent Labs  Lab 02/06/23 1532 02/07/23 0128 02/10/23 0813  WBC 13.6* 13.0* 10.1  NEUTROABS 10.3* 12.3*  --   HGB 13.9 13.8 13.4  HCT 44.4 43.8 41.6  MCV 87.9 87.4 86.0  PLT 244 224 238    Basic Metabolic Panel:  Recent Labs  Lab 02/07/23 0128 02/10/23 0813  NA 141 137  K 4.2 3.8  CL 111 106  CO2 22 19*  GLUCOSE 149* 108*  BUN 15 13  CREATININE 0.72 0.59  CALCIUM 8.4* 9.3    Lipid Panel:  Recent Labs  Lab 02/07/23 0128  CHOL 122  TRIG 92  HDL 46  CHOLHDL 2.7  VLDL 18  LDLCALC 58    HgbA1c:  Recent Labs  Lab 02/07/23 0128  HGBA1C 6.2*    Urine Drug Screen:  Recent Labs  Lab 02/07/23 0132  LABOPIA NONE DETECTED  COCAINSCRNUR NONE DETECTED  LABBENZ NONE DETECTED  AMPHETMU NONE DETECTED  THCU NONE DETECTED  LABBARB NONE DETECTED     Alcohol Level No results for input(s): "ETH" in the last 168 hours.  IMAGING past 24 hours No results found.  PHYSICAL EXAM General: Alert, well-nourished, well-developed elderly Caucasian lady in no acute distress Respiratory: Regular, unlabored respirations on room air  NEURO:  Mental Status: AA&Ox3, drowsy Speech/Language: speech is without dysarthria or aphasia.   Some right-sided neglect noted, with patient initially  unable to recognize right hand but able to see it after some time  Cranial Nerves:  II: PERRL.  Right hemianopsia III, IV, VI: Left gaze preference, able to cross midline VII: Slight right facial droop VIII: hearing intact to voice. IX, X: Phonation is normal.  XII: tongue is midline without fasciculations. Motor: 5/5 strength to left upper and lower extremities, 2 out of 5 strength to right upper and right lower extremities Tone: is normal and bulk is normal Sensation-diminished on the right Gait- deferred   ASSESSMENT/PLAN Loretta Gates is a 87 y.o. female with history of A-fib on Eliquis, hypertension, bladder cancer status post TURBT on 4/29, CHF, chronic venous insufficiency, left breast cancer status post chemo and mastectomy, left thyroidectomy, right inferior parathyroidectomy, anemia and neuropathy.  On Sunday, she had an episode of amaurosis fugax followed by a repeat episode of this yesterday.  Of note, she is taking Eliquis for atrial fibrillation but had paused this medication for bladder surgery on 4/29.  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  As she was symptomatic, she was taken to IR for angiogram and revascularization of left carotid artery as well as occluded left M2 segment.  Post IR CT demonstrated  small left subarachnoid hemorrhage versus contrast.  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 Small vessel disease.  CTA head & neck occlusion of left ICA just distal to the takeoff, no other hemodynamically significant stenosis IR left ICA occlusion with TICI3 and left M2 nonocclusive clot with TICI2c, as well as left ICA stenting Post IR CT mild left perisylvian hyperattenuation possibly SAH MRI large posterior left MCA infarct, small left  cerebellum, left occipital lobe infarcts.  SAH in the left sylvian fissure and over the posterior left frontal lobe MRA Flow signal is now seen in the cervical, petrous, cavernous, and supraclinoid segments of the left ICA. There is a small filling defect of the petrous segment of the left ICA, which could represent residual thrombus or possibly a dissection. CT head 5/2 left temporal small ICH as well as some inferior SAH, stable.  Left posterior MCA infarct involving, stable. CT Head 5/3- Regression of left hemisphere SAH. Otherwise stable since 02/07/2023 left temporal lobe intra-axial hematoma, left MCA cytotoxic edema. 2D Echo EF 55 to 60% (10/2022) LDL 58 HgbA1c 6.2 UDS negative VTE prophylaxis -SCDs Eliquis (apixaban) daily prior to admission, now on aspirin 81 mg daily and Brilinta (ticagrelor) 90->45 mg bid due to [facility]. May resume Eliquis after SAH/ICH resolves P2Y12- pending  Therapy recommendations: CIR- consult placed Disposition: Pending  Atrial fibrillation Failed cardioversion Patient has history of atrial fibrillation on Eliquis Eliquis had been paused for bladder procedure Eliquis held at this time due to [facility], may resume after SAH/ICH resolves  Hypertension Home meds: Irbesartan 300 mg daily Stable BP goal less than 160 given SAH Long-term BP goal normotensive  Lipid management Home meds: None LDL 58, goal < 70 No statin needed at this time given LDL at goal and advanced age  Other Stroke Risk Factors Advanced Age >/= 17  Obesity, Body mass index is 37.16 kg/m., BMI >/= 30 associated with increased stroke risk, recommend weight loss, diet and exercise as appropriate   Other Active Problems Bladder cancer with TURBT 02/05/2023-monitor for hematuria Urine is straw colored History of left breast cancer status post mastectomy and chemotherapy in 2008 Remote head and neck radiation in 1940s Status post left thyroidectomy Status post right inferior  parathyroidectomy  Hospital day # 4  Patient seen and examined by NP/APP with MD. MD to update note as needed.   Loretta Gates , MSN, AGACNP-BC [facility] See Amion for schedule and pager information 02/10/2023 10:15 AM   I have personally obtained history,examined this patient, reviewed notes, independently viewed imaging studies, participated in medical decision making and plan of care.ROS completed by me personally and pertinent positives fully documented  I have made any additions or clarifications directly to the above note. Agree with note above.  Continue ongoing therapies.  Medically stable to be transferred to inpatient rehab after insurance approval and bed availability.  Long discussion with patient and husband and daughter at the bedside and answered questions.  Greater than 50% time during this 35-minute visit were spent in counseling and coordination of care about his stroke discussion with patient and family and answering questions  [name], MD Medical Director [facility] Pager: [phone] 02/10/2023 1:01 PM   To contact Stroke Continuity provider, please refer to [email or web address]. After hours, contact General Neurology

## 2023-02-10 NOTE — PMR Pre-admission (Signed)
PMR Admission Coordinator Pre-Admission Assessment  Patient: Loretta Gates is an 87 y.o., female MRN: [id number] DOB: 1936-07-25 Height: 4\' 11"  (149.9 cm) Weight: 83.5 kg           Insurance Information HMO:     PPO: yes     PCP:      IPA:      80/20:      OTHER:  PRIMARY: [employer] Medicare      Policy#: [id number]     Subscriber: pt CM Name: Loretta Gates      Phone#: [phone]     Fax#: [phone] Pre-Cert#: [id number] approved until 5/19 when updates are due      Employer:  Benefits:  Phone #: [phone]     Name: 5/6 Eff. Date: 10/09/22     Deduct: none      Out of Pocket Max: $4500      Life Max: none  CIR: $295 co pay per day days 1 until 6      SNF: no co pay per day days 1 until 20; $203 co pay per day days 21 until 100 Outpatient: $20 per visit     Co-Pay: visits per medical neccesity Home Health: 100%      Co-Pay: visits per medical neccesity DME: 80%     Co-Pay: 20% Providers: in network  SECONDARY: none  Financial Counselor:       Phone#:   The Data processing manager" for patients in Inpatient Rehabilitation Facilities with attached "Privacy Act Statement-Health Care Records" was provided and verbally reviewed with: Family  Emergency Contact Information Contact Information     Name Relation Home Work Mobile   Loretta Gates Daughter   [phone]   Loretta Gates   [phone]   Loretta Gates Relative   [phone]      Current Medical History  Patient Admitting Diagnosis: CVA  History of Present Illness:  87 y.o. female with a past medical history of atrial fibrillation on Eliquis (which she had been holding for bladder surgery), hypertension, bladder cancer (with hx of TURBT on 02/05/23), left breast cancer who presented on 02/06/2023 with a 2-day history of visual changes in her right eye.   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  She was taken to IR for angiogram and revascularization of the left carotid artery as well as  includes a left M2 segment.  Post radiology CT demonstrated small left subarachnoid hemorrhage potentially.  MRI revealed a left MCA infarct involving large portions of the left parietal and temporal lobes and to a lesser degree the left frontal lobe and insula.  There were additional small acute infarcts also present in the left cerebellum and left occipital lobe.  Subarachnoid hemorrhage was re demonstrated in the left sylvian fissure and over the posterior left frontal lobe.  Patient was placed on aspirin 81 mg daily as well as Brilinta 90 mg twice daily given her subarachnoid hemorrhage.  Plan is to resume Eliquis after subarachnoid hemorrhage resolves.  Patient's also had hematuria related to her bladder cancer in prior anticoagulation. Noted her heart rate increased to 156 upon standing.  Rate eventually climbed to 180 which resolved with IV metoprolol.     Complete NIHSS TOTAL: 14 Glasgow Coma Scale Score: 14  Patient's medical record from [facility] has been reviewed by the rehabilitation admission coordinator and physician.  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 patel;   2006  s/p left thyroidectomy w/ right inferior parathyoidectomy  (per path speciman marked parathyroid but only thyroid tissue)   Hypertension    IDA (iron deficiency anemia)    hematology/ oncologist--- dr ennever/ sarah carter NP;  treated w/ IV iron infusions   Multinodular thyroid    Neuropathy    mild hands/ feet, uses cane   Nocturia more than twice per night    Nocturnal leg cramps    OA (osteoarthritis)    knees   OAB (overactive bladder)    Osteoporosis    PAF (paroxysmal atrial fibrillation) [facility])    cardiologist--- dr Jens Som   PONV (postoperative nausea and vomiting)    Vitamin D deficiency    Wears glasses    Has the patient had major surgery during 100 days prior to admission? Yes  Family History  family history is not on file.  Current Medications   Current Facility-Administered Medications:    acetaminophen (TYLENOL) tablet 650 mg, 650 mg, Oral, Q4H PRN, 650 mg at 02/12/23 0834 **OR** acetaminophen (TYLENOL) 160 MG/5ML solution 650 mg, 650 mg, Per Tube, Q4H PRN, 650 mg at 02/10/23 0221 **OR** acetaminophen (TYLENOL) suppository 650 mg, 650 mg, Rectal, Q4H PRN, Deveshwar, Sanjeev, MD   aspirin chewable tablet 81 mg, 81 mg, Oral, Daily, 81 mg at 02/13/23 1057 **OR** aspirin chewable tablet 81 mg, 81 mg, Per Tube, Daily, Deveshwar, Sanjeev, MD   cholestyramine (QUESTRAN) packet 4 g, 4 g, Oral, q1800, de Saintclair Halsted, Cortney E, NP, 4 g at 02/12/23 1830   enoxaparin (LOVENOX) injection 40 mg, 40 mg, Subcutaneous, Q24H, Marvel Plan, MD, 40 mg at 02/13/23 1056   metoprolol tartrate (LOPRESSOR) injection 5 mg, 5 mg, Intravenous, Q5 min PRN, Bhagat, Srishti L, MD, 5 mg at 02/08/23 0108   metoprolol tartrate (LOPRESSOR) tablet 75 mg, 75 mg, Oral, BID, Bhagat, Srishti L, MD, 75 mg at 02/13/23 1057   Oral care mouth rinse, 15 mL, Mouth Rinse, PRN, Bhagat, Srishti L, MD    oxybutynin (DITROPAN-XL) 24 hr tablet 15 mg, 15 mg, Oral, QHS, de [place] Torre, [place] E, NP, 15 mg at 02/12/23 2206   pantoprazole (PROTONIX) EC tablet 40 mg, 40 mg, Oral, Daily, Marvel Plan, MD, 40 mg at 02/13/23 1056   QUEtiapine (SEROQUEL) tablet 25 mg, 25 mg, Oral, QHS PRN, Pamalee Leyden, Devon, NP   senna-docusate (Senokot-S) tablet 1 tablet, 1 tablet, Oral, QHS PRN, Bhagat, Srishti L, MD   ticagrelor (BRILINTA) tablet 45 mg, 45 mg, Oral, BID, Gershon Crane, PA-C, 45 mg at 02/13/23 1057  Patients Current Diet:  Diet Order             Diet regular Room service appropriate? No; Fluid consistency: Thin  Diet effective now                  Precautions / Restrictions Precautions Precautions: Fall Precaution Comments: decreased right visual and physical attention Restrictions Weight Bearing Restrictions: No   Has the patient had 2 or more falls or a fall with injury in the past year?No  Prior Activity Level Community (5-7x/wk): Mod I with cane, active and driving  Prior Functional Level Prior Function Prior Level of Function : Independent/Modified Independent Mobility Comments: SPC used during the day. Quad cane is used during the night. ADLs Comments: has sock aid, needs help with fastening bra  Self Care: Did the patient need help bathing, dressing, using the toilet or eating?  Independent  Indoor Mobility: Did the patient need assistance with walking from room to room (with or without device)? Independent  Stairs: Did the patient need assistance with internal or external stairs (with or without device)? Independent  Functional Cognition: Did the patient need help planning regular tasks such as shopping or remembering to take medications? Independent  Patient Information Are you of Hispanic, Latino/a,or Spanish origin?: X. Patient unable to respond (dtr states no) What is your race?: X. Patient unable to respond (dtr states white) Do you need or want an interpreter to  communicate with a doctor or health care staff?: 9. Unable to respond (dtr states no)  Patient's Response To:  Health Literacy and Transportation Is the patient able to respond to health literacy and transportation needs?: No Health Literacy - How often do you need to have someone help you when you read instructions, pamphlets, or other written material from your doctor or pharmacy?: Patient unable to respond In the past 12 months, has lack of transportation kept you from medical appointments or from getting medications?: No (dtr states no) In the past 12 months, has lack of transportation kept you from meetings, work, or from getting things needed for daily living?: No (dtr states no)  Journalist, newspaper / Equipment Home Equipment: Shower seat, Medical laboratory scientific officer - quad, Medical laboratory scientific officer - single point, Wheelchair - manual, Agricultural consultant (2 wheels), Crutches, Adaptive equipment, Grab bars - tub/shower  Prior Device Use: Indicate devices/aids used by the patient prior to current illness, exacerbation or injury?  cane  Current Functional Level Cognition  Arousal/Alertness: Lethargic Overall Cognitive Status: Impaired/Different from baseline Current Attention Level: Focused Orientation Level: Oriented to person, Oriented to place, Oriented to time Following Commands: Follows one step commands with increased time Safety/Judgement: Decreased awareness of deficits, Decreased awareness of safety General Comments: difficult to assess due to expressive aphasia, frequently says, "that was awkward" in multiple situations Attention: Sustained Sustained Attention: Impaired Sustained Attention Impairment: Verbal complex, Functional complex Memory: Impaired Memory Impairment: Retrieval deficit Awareness: Impaired Awareness Impairment: Intellectual impairment Problem Solving: Impaired Problem Solving Impairment: Verbal complex    Extremity Assessment (includes Sensation/Coordination)  Upper Extremity Assessment:  Defer to OT evaluation RUE Deficits / Details: Demonstrates generalized weakness including gross grasp. Able to raise UE to touch therapist's hand above pt's shoulder height. Slow motor movement. Impaired finger nose test. RUE Coordination: decreased fine motor, decreased gross motor LUE Deficits / Details: demonstrates generalized weakness. A/ROM appears WFL.  Lower Extremity Assessment: Generalized weakness (pt unable to follow commands to complete MMT, able to stand without buckling)    ADLs  Overall ADL's :  Needs assistance/impaired Eating/Feeding: Maximal assistance, Bed level Grooming: Oral care, Standing, Moderate assistance Grooming Details (indicate cue type and reason): brushing teeth while standing at sink. 2 person assist to maintain standing balance. Supplies placed in right visual field to encourage visual scanning. Max difficulty and increased time required to scan and locate items. Upper Body Bathing: Total assistance, Bed level Lower Body Bathing: Total assistance, Bed level Upper Body Dressing : Total assistance, Sitting Lower Body Dressing: Total assistance, Bed level Toilet Transfer: Total assistance, +2 for physical assistance, +2 for safety/equipment, Stand-pivot, Cueing for sequencing, Cueing for safety Toilet Transfer Details (indicate cue type and reason): Total A of 2 to transfer Toileting- Clothing Manipulation and Hygiene: Total assistance, +2 for physical assistance, +2 for safety/equipment, Sitting/lateral lean, Sit to/from stand Toileting - Clothing Manipulation Details (indicate cue type and reason): RN assisting with peri-care Functional mobility during ADLs: Total assistance, +2 for physical assistance, +2 for safety/equipment, Cueing for safety, Cueing for sequencing General ADL Comments: Patient session focus on increasing activity tolerance, overall cognition, and participation in ADL tasks.    Mobility  Overal bed mobility: Needs Assistance Bed Mobility:  Supine to Sit Supine to sit: Mod assist, +2 for physical assistance, HOB elevated Sit to supine: Total assist General bed mobility comments: received in recliner    Transfers  Overall transfer level: Needs assistance Equipment used: 2 person hand held assist Transfers: Sit to/from Stand, Bed to chair/wheelchair/BSC Sit to Stand: Mod assist, +2 physical assistance, From elevated surface Bed to/from chair/wheelchair/BSC transfer type:: Step pivot Stand pivot transfers: Total assist, +2 physical assistance Step pivot transfers: Max assist, +2 physical assistance General transfer comment: 2 person assist provided for sit to stand, needed mod A on R, min A on L. Performed >3 times from recliner and small armchair in room    Ambulation / Gait / Stairs / Wheelchair Mobility  Ambulation/Gait Ambulation/Gait assistance: Mod assist, +2 physical assistance, +2 safety/equipment Gait Distance (Feet): 6 Feet (2x) Assistive device: 2 person hand held assist Gait Pattern/deviations: Step-to pattern, Decreased stride length, Decreased dorsiflexion - right, Decreased weight shift to right, Knee flexed in stance - right, Narrow base of support General Gait Details: pt with small steps and minimal clearance. max directional cues and assist due to R lateral lean. Gait velocity: decreased Gait velocity interpretation: <1.31 ft/sec, indicative of household ambulator    Posture / Balance Dynamic Sitting Balance Sitting balance - Comments: able to maintain balance sitting edge of chair Balance Overall balance assessment: Needs assistance Sitting-balance support: Single extremity supported, Feet supported Sitting balance-Leahy Scale: Fair Sitting balance - Comments: able to maintain balance sitting edge of chair Postural control: Posterior lean, Right lateral lean Standing balance support: Bilateral upper extremity supported, During functional activity Standing balance-Leahy Scale: Poor Standing balance  comment: mod A needed on L side as well as B UE support. Stood at sink working on erect posture and reaching with LUE and assisted reaching with RUE    Special needs/care consideration Fall precautions   Previous Home Environment  Living Arrangements: Spouse/significant other, Children  Lives With: Spouse Available Help at Discharge: Family, Available 24 hours/day (dtr works but states can take time off) Type of Home: House Home Layout: Able to live on main level with bedroom/bathroom, Laundry or work area in basement Home Access: Stairs to enter Entrance Stairs-Rails: Right, Left, Can reach both Entrance Stairs-Number of Steps: 4 Bathroom Shower/Tub: Engineer, manufacturing systems: Standard Bathroom Accessibility: Yes How Accessible: Accessible via walker Home Care Services:  No  Discharge Living Setting Plans for Discharge Living Setting: Patient's home, Lives with (comment) (spouse) Type of Home at Discharge: House Discharge Home Layout: Able to live on main level with bedroom/bathroom Discharge Home Access: Stairs to enter Entrance Stairs-Rails: Right, Left, Can reach both Entrance Stairs-Number of Steps: 4 Discharge Bathroom Shower/Tub: Tub/shower unit Discharge Bathroom Toilet: Standard Discharge Bathroom Accessibility: Yes How Accessible: Accessible via walker Does the patient have any problems obtaining your medications?: No  Social/Family/Support Systems Patient Roles: Spouse, Parent Contact Information: daughter and spouse Anticipated Caregiver: daughter and spouse Anticipated Caregiver's Contact Information: see contacts Ability/Limitations of Caregiver: dtr states she can take time off from work Caregiver Availability: 24/7 Discharge Plan Discussed with Primary Caregiver: Yes Is Caregiver In Agreement with Plan?: Yes Does Caregiver/Family have Issues with Lodging/Transportation while Pt is in Rehab?: No  Goals Patient/Family Goal for Rehab: supervision to min  with PT, OT and SLP Expected length of stay: ELOS 17 to 24 days Pt/Family Agrees to Admission and willing to participate: Yes Program Orientation Provided & Reviewed with Pt/Caregiver Including Roles  & Responsibilities: Yes  Decrease burden of Care through IP rehab admission: n/a  Possible need for SNF placement upon discharge:not anticipated  Patient Condition: This patient's medical and functional status has changed since the consult dated: 02/08/23 in which the Rehabilitation Physician determined and documented that the patient's condition is appropriate for intensive rehabilitative care in an inpatient rehabilitation facility. See "History of Present Illness" (above) for medical update. Functional changes are: overall mod assist. Patient's medical and functional status update has been discussed with the Rehabilitation physician and patient remains appropriate for inpatient rehabilitation. Will admit to inpatient rehab today.  Preadmission Screen Completed By:  Clois Dupes, RN MSN 02/13/2023 2:50 PM ______________________________________________________________________   Discussed status with Dr. Berline Chough on 02/13/23 at 1450 and received approval for admission today.  Admission Coordinator:  Worthy Keeler MSN time [id number] Date 02/13/23

## 2023-02-10 NOTE — Plan of Care (Signed)
  Problem: Education: Goal: Knowledge of General Education information will improve Description: Including pain rating scale, medication(s)/side effects and non-pharmacologic comfort measures Outcome: Progressing   Problem: Health Behavior/Discharge Planning: Goal: Ability to manage health-related needs will improve Outcome: Progressing   Problem: Clinical Measurements: Goal: Ability to maintain clinical measurements within normal limits will improve Outcome: Progressing   Problem: Education: Goal: Knowledge of disease or condition will improve Outcome: Progressing Goal: Knowledge of secondary prevention will improve (MUST DOCUMENT ALL) Outcome: Progressing Goal: Knowledge of patient specific risk factors will improve [name] N/A or DELETE if not current risk factor) Outcome: Progressing   Problem: Ischemic Stroke/TIA Tissue Perfusion: Goal: Complications of ischemic stroke/TIA will be minimized Outcome: Progressing   Problem: Coping: Goal: Will verbalize positive feelings about self Outcome: Progressing Goal: Will identify appropriate support needs Outcome: Progressing

## 2023-02-10 NOTE — Progress Notes (Signed)
Pt transferred to 3W10, accompanied by her daughter, and personal belongings, receiving RN was given report,all questions answered

## 2023-02-10 NOTE — Plan of Care (Signed)

## 2023-02-11 DIAGNOSIS — I6522 Occlusion and stenosis of left carotid artery: Secondary | ICD-10-CM | POA: Diagnosis not present

## 2023-02-11 NOTE — Progress Notes (Addendum)
STROKE TEAM PROGRESS NOTE   INTERVAL HISTORY Patient is seen in her room with her husband and daughter at the bedside.  Patient is sitting up in bedside recliner on assessment requesting to go back to bed due to fatigue.  Patient was hemodynamically stable overnight.  Pending CIR re-evaluation and insurance authorization (previously on 5/3, patient was felt to be too lethargic for CIR admission). Neurological exam is unchanged with left gaze preference and right hemiplegia and hemianopsia.  Vitals:   02/10/23 1618 02/10/23 2012 02/11/23 0027 02/11/23 0421  BP: 139/69 136/78 124/76 (!) 151/81  Pulse: 90 97 78 84  Resp: 20 20 18 18   Temp: 98.3 F (36.8 C) 98.7 F (37.1 C) 98 F (36.7 C) 99 F (37.2 C)  TempSrc: Oral Axillary Axillary Axillary  SpO2: 97% 97% 97% 98%  Weight:      Height:       CBC:  Recent Labs  Lab 02/06/23 1532 02/07/23 0128 02/10/23 0813  WBC 13.6* 13.0* 10.1  NEUTROABS 10.3* 12.3*  --   HGB 13.9 13.8 13.4  HCT 44.4 43.8 41.6  MCV 87.9 87.4 86.0  PLT 244 224 238    Basic Metabolic Panel:  Recent Labs  Lab 02/07/23 0128 02/10/23 0813  NA 141 137  K 4.2 3.8  CL 111 106  CO2 22 19*  GLUCOSE 149* 108*  BUN 15 13  CREATININE 0.72 0.59  CALCIUM 8.4* 9.3    Lipid Panel:  Recent Labs  Lab 02/07/23 0128  CHOL 122  TRIG 92  HDL 46  CHOLHDL 2.7  VLDL 18  LDLCALC 58    HgbA1c:  Recent Labs  Lab 02/07/23 0128  HGBA1C 6.2*    Urine Drug Screen:  Recent Labs  Lab 02/07/23 0132  LABOPIA NONE DETECTED  COCAINSCRNUR NONE DETECTED  LABBENZ NONE DETECTED  AMPHETMU NONE DETECTED  THCU NONE DETECTED  LABBARB NONE DETECTED     Alcohol Level No results for input(s): "ETH" in the last 168 hours.  IMAGING past 24 hours No results found.  PHYSICAL EXAM General: Alert, well-nourished, well-developed elderly Caucasian lady in no acute distress Respiratory: Regular, unlabored respirations on room air  NEURO:  Mental Status: AA&Ox3,  drowsy Speech/Language: Speech is without dysarthria or aphasia.   Some right-sided neglect noted, with patient does not recognize right hand on examination today  Cranial Nerves:  II: PERRL.  Right hemianopsia III, IV, VI: Left gaze preference, able to cross midline VII: Slight right facial droop VIII: Hearing intact to voice. IX, X: Phonation is normal.  XII: Tongue protrudes midline without fasciculations. Motor: 5/5 strength to left upper and lower extremities, 2/5 strength to right upper extremity and 3/5 right lower extremities Tone: is normal and bulk is normal Sensation-diminished on the right Gait- deferred for patient's safety  ASSESSMENT/PLAN Ms. JERMANI GOLUB is a 87 y.o. female with history of A-fib on Eliquis, hypertension, bladder cancer status post TURBT on 4/29, CHF, chronic venous insufficiency, left breast cancer status post chemo and mastectomy, left thyroidectomy, right inferior parathyroidectomy, anemia and neuropathy.  On Sunday, she had an episode of amaurosis fugax followed by a repeat episode of this yesterday.  Of note, she is taking Eliquis for atrial fibrillation but had paused this medication for bladder surgery on 4/29.  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  As she was symptomatic, she was taken to IR for angiogram and revascularization of left carotid artery as well as occluded left  M2 segment.  Post IR CT demonstrated small left subarachnoid hemorrhage versus contrast.  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 Small vessel disease.  CTA head & neck occlusion of left ICA just distal to the takeoff, no other hemodynamically significant stenosis IR left ICA occlusion with TICI3 and left M2 nonocclusive clot with TICI2c, as well as left  ICA stenting Post IR CT mild left perisylvian hyperattenuation possibly SAH MRI large posterior left MCA infarct, small left cerebellum, left occipital lobe infarcts.  SAH in the left sylvian fissure and over the posterior left frontal lobe MRA Flow signal is now seen in the cervical, petrous, cavernous, and supraclinoid segments of the left ICA. There is a small filling defect of the petrous segment of the left ICA, which could represent residual thrombus or possibly a dissection. CT head 5/2 left temporal small ICH as well as some inferior SAH, stable.  Left posterior MCA infarct involving, stable. CT Head 5/3- Regression of left hemisphere SAH. Otherwise stable since 02/07/2023 left temporal lobe intra-axial hematoma, left MCA cytotoxic edema. 2D Echo EF 55 to 60% (10/2022) LDL 58 HgbA1c 6.2 UDS negative VTE prophylaxis -SCDs Eliquis (apixaban) daily prior to admission, now on aspirin 81 mg daily and Brilinta (ticagrelor) 90->45 mg bid due to [facility]. May resume Eliquis after SAH/ICH resolves P2Y12- pending  Therapy recommendations: CIR- re-evaluation pending (5/3 patient felt to be too lethargic for CIR approval at that time) Disposition: Pending  Atrial fibrillation Failed cardioversion Patient has history of atrial fibrillation on Eliquis Eliquis had been paused for bladder procedure Eliquis held at this time due to [facility], may resume after SAH/ICH resolves  Hypertension Home meds: Irbesartan 300 mg daily Stable BP goal less than 160 given SAH Long-term BP goal normotensive  Lipid management Home meds: None LDL 58, goal < 70 No statin needed at this time given LDL at goal and advanced age  Other Stroke Risk Factors Advanced Age >/= 65  Obesity, Body mass index is 37.16 kg/m., BMI >/= 30 associated with increased stroke risk, recommend weight loss, diet and exercise as appropriate   Other Active Problems Bladder cancer with TURBT 02/05/2023-monitor for hematuria Urine is straw  colored History of left breast cancer status post mastectomy and chemotherapy in 2008 Remote head and neck radiation in 1940s Status post left thyroidectomy Status post right inferior parathyroidectomy  Hospital day # 5  Patient seen and examined by NP/APP with MD. MD to update note as needed.   Kara Mead , MSN, AGACNP-BC [facility] See Amion for schedule and pager information 02/11/2023 7:58 AM  I have personally obtained history,examined this patient, reviewed notes, independently viewed imaging studies, participated in medical decision making and plan of care.ROS completed by me personally and pertinent positives fully documented  I have made any additions or clarifications directly to the above note. Agree with note above.  Patient is neurologically stable with significant right hemiparesis and mild neglect.  Plan repeat CT head tomorrow to look for resolution of subarachnoid hemorrhage.  Continue ongoing therapies.  Medically stable to be transferred to inpatient rehab when bed available over the next few days..  50% time during this 35-minute visit was spent in counseling and coordination of care about his stroke and hemorrhage discussion with patient and family and answering questions  Aneth Schlagel  Pearlean Brownie, MD Medical Director [facility] St[facility] Pager: [phone] 02/11/2023 12:38 PM   To contact Stroke Continuity provider, please refer to [email or web address]. After hours, contact General Neurology

## 2023-02-11 NOTE — Progress Notes (Signed)
Referring Physician(s): Marvel Plan, MD  Supervising Physician: Julieanne Cotton  Patient Status:  [facility] - In-pt  Chief Complaint: Follow up amaurosis fugax/left ICA thrombectomy and stenting 02/06/23 in NIR  Subjective:  Patient seen on the floor with Dr. Corliss Skains, her daughter [name] present during exam - she reports her mom is greatly improved today, she has a picture of her sleeping with her right arm above her head and also her right knee bent. She has also noticed that when she sneezes her right arm twitches. She is able to pull herself up to help with transferring to use bedside commode. She ate grits and some fruit this morning for breakfast without difficulty. She states plan is for inpatient rehab earlier this week..  Ms. Homsey is smiling, responds appropriately to general questions ("Good morning" "How are you doing?"). When asked where she is she states "hospital" and when asked what hospital she states "[facility]." She is asking several times if we are with pathology, her daughter thinks she has this on her mind from her TURBT last week because they were told the specimens would be looked at by a pathologist. Reviewed who we are with patient who states understanding and asks if surgery is fun to do. She denies any complaints right now.  Allergies: Bee venom, Levaquin [levofloxacin], and Peanut-containing drug products  Medications: Prior to Admission medications   Medication Sig Start Date End Date Taking? Authorizing Provider  apixaban (ELIQUIS) 5 MG TABS tablet TAKE ONE TABLET BY MOUTH TWICE A DAY 12/14/22  Yes Copland, Gwenlyn Found, MD  cholestyramine (QUESTRAN) 4 GM/DOSE powder Take 1 packet (4 g total) by mouth See admin instructions. Mix 1 teaspoon in water and take at 6pm daily with warfarin. Patient taking differently: Take 4 g by mouth See admin instructions. Mix 1 teaspoon in water and take daily 11/06/22  Yes Copland, Gwenlyn Found, MD  cinacalcet (SENSIPAR) 30 MG  tablet Take 30 mg by mouth daily with breakfast.   Yes [provider]  denosumab (PROLIA) 60 MG/ML SOSY injection Inject 60 mg into the skin every 6 (six) months.   Yes [provider]  empagliflozin (JARDIANCE) 10 MG TABS tablet TAKE ONE TABLET BY MOUTH ONE TIME DAILY Patient taking differently: Take 10 mg by mouth daily. 12/14/22  Yes Copland, Gwenlyn Found, MD  ergocalciferol (VITAMIN D2) 1.25 MG (50000 UT) capsule Take 50,000 Units by mouth every 14 (fourteen) days. Every other saturday   Yes [provider]  furosemide (LASIX) 20 MG tablet Alternate taking 20 mg po daily with taking 30 mg po daily ever other day Patient taking differently: Take 20 mg by mouth as directed. Alternate taking 20 mg po daily with taking 30 mg po daily ever other day 01/10/23  Yes Copland, Gwenlyn Found, MD  Magnesium Oxide -Mg Supplement 500 MG TABS Take 500 mg by mouth daily.   Yes [provider]  metoprolol tartrate (LOPRESSOR) 50 MG tablet Take 1.5 tablets (75 mg total) by mouth 2 (two) times daily. 11/20/22  Yes Copland, Gwenlyn Found, MD  omeprazole (PRILOSEC) 40 MG capsule Take 1 capsule (40 mg total) by mouth 2 (two) times daily before lunch and supper. Patient taking differently: Take 40 mg by mouth in the morning and at bedtime. 12/25/22  Yes Copland, Gwenlyn Found, MD  oxybutynin (DITROPAN XL) 15 MG 24 hr tablet Take 1 tablet (15 mg total) by mouth at bedtime. Patient taking differently: Take 15 mg by mouth at bedtime. 05/05/22  Yes Copland, Gwenlyn Found, MD  irbesartan (AVAPRO) 300 MG tablet TAKE ONE TABLET BY MOUTH ONE TIME DAILY 02/09/23   Copland, Gwenlyn Found, MD  traMADol (ULTRAM) 50 MG tablet Take 1 tablet (50 mg total) by mouth every 6 (six) hours as needed. Patient not taking: Reported on 02/06/2023 02/05/23 02/05/24  Noel Christmas, MD     Vital Signs: BP 112/71 (BP Location: Right Arm)   Pulse 80   Temp 98.2 F (36.8 C) (Oral)   Resp 17   Ht 4\' 11"  (1.499 m)   Wt 184 lb (83.5 kg)    SpO2 98%   BMI 37.16 kg/m   Physical Exam Vitals and nursing note reviewed.  Constitutional:      General: She is not in acute distress. Cardiovascular:     Rate and Rhythm: Normal rate.  Skin:    General: Skin is warm and dry.  Neurological:     Mental Status: She is alert.   Alert, awake, and oriented to person and place ("hospital" "[facility]") Speech and comprehension in tact. Follows simple commands, responds appropriately to basic questions. EOMs without nystagmus or subjective diplopia. Visual fields - grossly in tact (can read clock on wall correctly from chair), right sided peripheral vision deficit which is not new per daughter Slight right sided facial droop -- improved. Motor power -- full RLE and LUE/LLE, significant weakness RUE but daughter reports improved from yesterday  Imaging: CT HEAD WO CONTRAST ( )  Result Date: 02/09/2023 CLINICAL DATA:  87 year old female status post code stroke presentation on 02/06/2023 with left ICA ELVO. Treated with endovascular reperfusion. Left cerebral infarcts, predominantly the MCA territory. EXAM: CT HEAD WITHOUT CONTRAST TECHNIQUE: Contiguous axial images were obtained from the base of the skull through the vertex without intravenous contrast. RADIATION DOSE REDUCTION: This exam was performed according to the departmental dose-optimization program which includes automated exposure control, adjustment of the mA and/or kV according to patient size and/or use of iterative reconstruction technique. COMPARISON:  Post treatment MRI and MRA 02/07/2023. Head CT 02/07/2023 and earlier. FINDINGS: Brain: Oval hyperdense intra-axial hemorrhage in the left temporal lobe underlying the middle and superior temporal gyri (coronal image 31). Hyperdense blood products are 24 x 18 x 21 mm (AP by transverse by CC), stable since 02/07/2023 estimated intra-axial volume 4-5 mL (Heidelberg classification 1c: PH1, hematoma within infarcted tissue, occupying  <30%, no substantive mass effect). Small volume subarachnoid hemorrhage (Heidelberg classification 3c: Subarachnoid hemorrhage). In the left sylvian fissure has regressed. No superimposed IVH. No ventriculomegaly. Confluent cytotoxic edema in the posterior left MCA territory with only mild mass effect on the adjacent ventricle. Stable gray-white differentiation elsewhere. Basilar cisterns are patent. No new intracranial hemorrhage. No new cytotoxic edema Vascular: Calcified atherosclerosis at the skull base. Skull: No acute osseous abnormality identified. Sinuses/Orbits: Visualized paranasal sinuses and mastoids are stable and well aerated. Other: No acute orbit or scalp soft tissue finding. IMPRESSION: 1. Regression of left hemisphere subarachnoid hemorrhage. Otherwise stable since 02/07/2023 left temporal lobe intra-axial hematoma, left MCA cytotoxic edema. 2. No significant intracranial mass effect. No new intracranial abnormality. Electronically Signed   By: Odessa Fleming M.D.   On: 02/09/2023 06:14   CT HEAD WO CONTRAST ( )  Addendum Date: 02/08/2023   ADDENDUM REPORT: 02/08/2023 01:11 ADDENDUM: The area of parenchymal hemorrhage is new compared to the prior CT but was present on the interval MRI and appears stable from that exam. These findings were discussed by telephone on 02/08/2023 at 1:11 am  with provider SRISHTI BHAGAT . Electronically Signed   By: Wiliam Ke M.D.   On: 02/08/2023 01:11   Result Date: 02/08/2023 CLINICAL DATA:  Stroke, follow-up EXAM: CT HEAD WITHOUT CONTRAST TECHNIQUE: Contiguous axial images were obtained from the base of the skull through the vertex without intravenous contrast. RADIATION DOSE REDUCTION: This exam was performed according to the departmental dose-optimization program which includes automated exposure control, adjustment of the mA and/or kV according to patient size and/or use of iterative reconstruction technique. COMPARISON:  02/07/2023 CT head 1:56 a.m., correlation  is also made with 02/07/2023 MRI head FINDINGS: Brain: New area of parenchymal hemorrhage in the left temporal lobe, adjacent to the sylvian fissure, which measures approximately 2.0 x 1.3 x 1.5 cm (AP x TR x CC) (series 6, image 46 and series 5, image 38), with a volume of approximately 2 mL. Mild surrounding hypodensity, likely edema. Redemonstrated subarachnoid hemorrhage in the left sylvian fissure and along the left frontotemporal convexity. Hypodensity and loss of gray-white differentiation in the left posterior MCA territory (series 3, image 19), consistent with of all vein Ng infarct. No evidence of hemorrhage in this area. No significant mass effect or midline shift. No hydrocephalus. Vascular: No hyperdense vessel. Skull: Negative for fracture or focal lesion. Sinuses/Orbits: No acute finding. Other: The mastoid air cells are well aerated. IMPRESSION: 1. New area of parenchymal hemorrhage in the left temporal lobe, adjacent to the Sylvian fissure, which measures up to 2 cm, with a volume of approximately 2 mL. Heidelberg classification 1c: PH1, hematoma within infarcted tissue, occupying <30%, no substantive mass effect. 2. Redemonstrated subarachnoid hemorrhage in the left Sylvian fissure and along the left frontotemporal convexity. 3. Hypodensity and loss of gray-white differentiation in the left posterior MCA territory, consistent with evolving acute infarct. No evidence of hemorrhage in this area. 4. No significant mass effect or midline shift. These results will be called to the ordering clinician or representative by the Radiologist Assistant, and communication documented in the PACS or [employer]. Electronically Signed: By: Wiliam Ke M.D. On: 02/08/2023 00:11   MR ANGIO HEAD WO CONTRAST  Result Date: 02/07/2023 CLINICAL DATA:  Stroke suspected EXAM: MRI HEAD WITHOUT CONTRAST MRA HEAD WITHOUT CONTRAST TECHNIQUE: Multiplanar, multi-echo pulse sequences of the brain and surrounding  structures were acquired without intravenous contrast. Angiographic images of the Circle of Willis were acquired using MRA technique without intravenous contrast. COMPARISON:  CTA head and neck angiogram 02/06/2023 FINDINGS: MRI HEAD FINDINGS Brain: Acute left MCA territory infarct involving large portions of the left parietal and temporal lobes, as well as the left frontal lobe in the opercular regions. Additional small acute infarcts are also present in the left cerebellum, left occipital lobe. Redemonstrated is subarachnoid hemorrhage in the left sylvian fissure and over the posterior left frontal lobe. Sequela of moderate chronic microvascular ischemic change. No hydrocephalus. Vascular: See below Skull and upper cervical spine: Normal marrow signal. Sinuses/Orbits: Trace bilateral mastoid effusions. No middle ear effusion. Paranasal sinuses are clear. Orbits are unremarkable. Other: None. MRA HEAD FINDINGS Anterior circulation: Flow signal is now seen in the cervical, petrous, cavernous, and supraclinoid segments of the left ICA. Vessels in the left MCA territory appear asymmetrically small in size compared to the right. There is a small filling defect of the petrous segment of the left ICA (series 9, image 52). Posterior circulation: Normal Anatomic variants: IMPRESSION: 1. Acute left MCA territory infarct involving large portions of the left parietal and temporal lobes, and to a  lesser degree the left frontal lobe and insula. 2. Additional small acute infarcts are also present in the left cerebellum and the left occipital lobe, which raises the possibility for a central embolic phenomenon. 3. Redemonstrated subarachnoid hemorrhage in the left Sylvian fissure and over the posterior left frontal lobe. 4. Flow signal is now seen in the cervical, petrous, cavernous, and supraclinoid segments of the left ICA. There is a small filling defect of the petrous segment of the left ICA, which could represent residual  thrombus or possibly a dissection. Consider CTA head for further evaluation. These results will be called to the ordering clinician or representative by the Radiologist Assistant, and communication documented in the PACS or [employer]. Electronically Signed   By: Lorenza Cambridge M.D.   On: 02/07/2023 15:17   MR BRAIN WO CONTRAST  Result Date: 02/07/2023 CLINICAL DATA:  Stroke suspected EXAM: MRI HEAD WITHOUT CONTRAST MRA HEAD WITHOUT CONTRAST TECHNIQUE: Multiplanar, multi-echo pulse sequences of the brain and surrounding structures were acquired without intravenous contrast. Angiographic images of the Circle of Willis were acquired using MRA technique without intravenous contrast. COMPARISON:  CTA head and neck angiogram 02/06/2023 FINDINGS: MRI HEAD FINDINGS Brain: Acute left MCA territory infarct involving large portions of the left parietal and temporal lobes, as well as the left frontal lobe in the opercular regions. Additional small acute infarcts are also present in the left cerebellum, left occipital lobe. Redemonstrated is subarachnoid hemorrhage in the left sylvian fissure and over the posterior left frontal lobe. Sequela of moderate chronic microvascular ischemic change. No hydrocephalus. Vascular: See below Skull and upper cervical spine: Normal marrow signal. Sinuses/Orbits: Trace bilateral mastoid effusions. No middle ear effusion. Paranasal sinuses are clear. Orbits are unremarkable. Other: None. MRA HEAD FINDINGS Anterior circulation: Flow signal is now seen in the cervical, petrous, cavernous, and supraclinoid segments of the left ICA. Vessels in the left MCA territory appear asymmetrically small in size compared to the right. There is a small filling defect of the petrous segment of the left ICA (series 9, image 52). Posterior circulation: Normal Anatomic variants: IMPRESSION: 1. Acute left MCA territory infarct involving large portions of the left parietal and temporal lobes, and to a lesser  degree the left frontal lobe and insula. 2. Additional small acute infarcts are also present in the left cerebellum and the left occipital lobe, which raises the possibility for a central embolic phenomenon. 3. Redemonstrated subarachnoid hemorrhage in the left Sylvian fissure and over the posterior left frontal lobe. 4. Flow signal is now seen in the cervical, petrous, cavernous, and supraclinoid segments of the left ICA. There is a small filling defect of the petrous segment of the left ICA, which could represent residual thrombus or possibly a dissection. Consider CTA head for further evaluation. These results will be called to the ordering clinician or representative by the Radiologist Assistant, and communication documented in the PACS or [employer]. Electronically Signed   By: Lorenza Cambridge M.D.   On: 02/07/2023 15:17    Labs:  CBC: Recent Labs    12/22/22 1308 02/05/23 0949 02/06/23 1532 02/07/23 0128 02/10/23 0813  WBC 9.2  --  13.6* 13.0* 10.1  HGB 15.0 16.3* 13.9 13.8 13.4  HCT 46.6 48.0* 44.4 43.8 41.6  PLT 292  --  244 224 238    COAGS: Recent Labs    10/20/22 0959 10/21/22 0016 10/22/22 0026 10/22/22 1213  INR 3.2* 3.0* 2.4* 1.7*    BMP: Recent Labs    10/23/22  0018 10/25/22 1028 12/22/22 1308 02/05/23 0949 02/06/23 1532 02/07/23 0128 02/10/23 0813  NA 139   < > 141 142 138 141 137  K 4.3   < > 4.7 4.3 4.1 4.2 3.8  CL 106   < > 104 102 105 111 106  CO2 26   < > 21  --  26 22 19*  GLUCOSE 125*   < > 94 111* 100* 149* 108*  BUN 21   < > 19 25* 24* 15 13  CALCIUM 10.6*   < > 11.3*  --  8.9 8.4* 9.3  CREATININE 1.08*   < > 0.81 0.80 0.88 0.72 0.59  GFRNONAA 51*  --   --   --  >60 >60 >60   < > = values in this interval not displayed.    LIVER FUNCTION TESTS: Recent Labs    03/10/22 1011 07/12/22 1141 10/20/22 0959 12/01/22 1625 02/06/23 1532  BILITOT 0.5 0.7 0.9 0.9 0.8  AST 20 25 29 28 20   ALT 26 35 39 57* 19  ALKPHOS 65 52 49  --  43   PROT 7.2 7.1 6.9 6.5 6.3*  ALBUMIN 4.5 4.5 3.8  --  3.5    Assessment and Plan:  87 y/o F who presented to [facility] ED 02/06/23 with complaints of left eye amaurosis fugax, found to have left ICA occlusion for which she underwent emergent left ICA thrombectomy and stenting in NIR that same day. Patient seen today for routine follow up.  Per daughter patient overall improving slowly, continues to have right sided weakness but this has gotten better. Speech is clear and she is responding appropriately to questions/commands. Right peripheral vision deficit but otherwise no visual concerns. She is planned for inpatient rehab sometime next week.  Continue Brilinta 45 mg BID + ASA 81 mg QD due to presence of ICA stent. P2y12 has been ordered and is pending.  No further NIR needs at this time, will continue to follow peripherally.   Please call with questions or concerns.  Electronically Signed: Villa Herb, PA-C 02/11/2023, 9:45 AM   I spent a total of 15 Minutes at the the patient's bedside AND on the patient's hospital floor or unit, greater than 50% of which was counseling/coordinating care for amaurosis fugax.

## 2023-02-12 DIAGNOSIS — I6522 Occlusion and stenosis of left carotid artery: Secondary | ICD-10-CM | POA: Diagnosis not present

## 2023-02-12 MED ORDER — CHOLESTYRAMINE 4 G PO PACK
4.0000 g | PACK | Freq: Every day | ORAL | Status: DC
  Administered 2023-02-12: 4 g via ORAL
  Filled 2023-02-12 (×2): qty 1

## 2023-02-12 MED ORDER — TICAGRELOR 90 MG PO TABS: 45.0000 mg | ORAL_TABLET | Freq: Two times a day (BID) | ORAL | 2 refills | Status: DC

## 2023-02-12 MED FILL — Cholestyramine Powder Packets 4 GM: ORAL | Qty: 1 | Status: AC

## 2023-02-12 NOTE — Care Management Important Message (Signed)
Important Message  Patient Details  Name: Loretta Gates MRN: [id number] Date of Birth: [birth year]   Medicare Important Message Given:  Yes     Cedrica Brune Stefan Church 02/12/2023, 3:56 PM

## 2023-02-12 NOTE — Progress Notes (Addendum)
STROKE TEAM PROGRESS NOTE   INTERVAL HISTORY Patient is seen in her room with her husband and daughter at the bedside.  She is more alert and energetic today and is engaging actively with therapy.  Family is hopeful that she will be discharged to CIR soon.  She has been hemodynamically stable, and shows improved movement of right arm on neurological exam.  Vitals:   02/11/23 1500 02/11/23 2017 02/12/23 0352 02/12/23 0757  BP: 127/72 124/74 (!) 141/82 132/78  Pulse: 73 90 66 89  Resp: 17 18 17    Temp: (!) 97.4 F (36.3 C) (!) 97.5 F (36.4 C) (!) 97.1 F (36.2 C) 98.2 F (36.8 C)  TempSrc: Oral Oral Oral Oral  SpO2: 99% 96% 96% 100%  Weight:      Height:       CBC:  Recent Labs  Lab 02/06/23 1532 02/07/23 0128 02/10/23 0813  WBC 13.6* 13.0* 10.1  NEUTROABS 10.3* 12.3*  --   HGB 13.9 13.8 13.4  HCT 44.4 43.8 41.6  MCV 87.9 87.4 86.0  PLT 244 224 238    Basic Metabolic Panel:  Recent Labs  Lab 02/07/23 0128 02/10/23 0813  NA 141 137  K 4.2 3.8  CL 111 106  CO2 22 19*  GLUCOSE 149* 108*  BUN 15 13  CREATININE 0.72 0.59  CALCIUM 8.4* 9.3    Lipid Panel:  Recent Labs  Lab 02/07/23 0128  CHOL 122  TRIG 92  HDL 46  CHOLHDL 2.7  VLDL 18  LDLCALC 58    HgbA1c:  Recent Labs  Lab 02/07/23 0128  HGBA1C 6.2*    Urine Drug Screen:  Recent Labs  Lab 02/07/23 0132  LABOPIA NONE DETECTED  COCAINSCRNUR NONE DETECTED  LABBENZ NONE DETECTED  AMPHETMU NONE DETECTED  THCU NONE DETECTED  LABBARB NONE DETECTED     Alcohol Level No results for input(s): "ETH" in the last 168 hours.  IMAGING past 24 hours No results found.  PHYSICAL EXAM General: Alert, well-nourished, well-developed elderly Caucasian lady in no acute distress Respiratory: Regular, unlabored respirations on room air  NEURO:  Mental Status: AA&Ox3 Speech/Language: Speech is without dysarthria or aphasia.   Right-sided neglect not evident today with patient able to move right side on  command  Cranial Nerves:  II: PERRL.  Right hemianopsia III, IV, ZO:XWRU VII: Slight right facial droop VIII: Hearing intact to voice. IX, X: Phonation is normal.  XII: Tongue protrudes midline without fasciculations. Motor: 5/5 strength to left upper and lower extremities, 3/5 strength to right upper extremity and 4/5 right lower extremities Tone: is normal and bulk is normal Sensation-diminished in right leg but equal in upper body Gait- deferred for patient's safety  ASSESSMENT/PLAN [name] is a 87 y.o. female with history of A-fib on Eliquis, hypertension, bladder cancer status post TURBT on 4/29, CHF, chronic venous insufficiency, left breast cancer status post chemo and mastectomy, left thyroidectomy, right inferior parathyroidectomy, anemia and neuropathy.  On Sunday, she had an episode of amaurosis fugax followed by a repeat episode of this yesterday.  Of note, she is taking Eliquis for atrial fibrillation but had paused this medication for bladder surgery on 4/29.  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  As she was symptomatic, she was taken to IR for angiogram and revascularization of left carotid artery as well as occluded left M2 segment.  Post IR CT demonstrated small left subarachnoid hemorrhage versus contrast.  Amaurosis fugax  due to left ICA occlusion Stroke: Large left MCA stroke due to left ICA occlusion and MCA nonocclusive clot s/p IR with TICI3 and carotid stenting, likely Afib off Eliquis for procedure CT head No acute abnormality. Small vessel disease.  CTA head & neck occlusion of left ICA just distal to the takeoff, no other hemodynamically significant stenosis IR left ICA occlusion with TICI3 and left M2 nonocclusive clot with TICI2c, as well as left ICA stenting Post IR CT mild left perisylvian hyperattenuation possibly SAH MRI large posterior left MCA infarct, small left cerebellum, left occipital lobe infarcts.   SAH in the left sylvian fissure and over the posterior left frontal lobe MRA Flow signal is now seen in the cervical, petrous, cavernous, and supraclinoid segments of the left ICA. There is a small filling defect of the petrous segment of the left ICA, which could represent residual thrombus or possibly a dissection. CT head 5/2 left temporal small ICH as well as some inferior SAH, stable.  Left posterior MCA infarct involving, stable. CT Head 5/3- Regression of left hemisphere SAH. Otherwise stable since 02/07/2023 left temporal lobe intra-axial hematoma, left MCA cytotoxic edema. 2D Echo EF 55 to 60% (10/2022) LDL 58 HgbA1c 6.2 UDS negative VTE prophylaxis -SCDs Eliquis (apixaban) daily prior to admission, now on aspirin 81 mg daily and Brilinta (ticagrelor) 90->45 mg bid due to [facility]. May resume Eliquis after SAH/ICH resolves P2Y12- pending  Therapy recommendations: CIR- re-evaluation pending (5/3 patient felt to be too lethargic for CIR approval at that time) Disposition: Pending  Atrial fibrillation Failed cardioversion Patient has history of atrial fibrillation on Eliquis Eliquis had been paused for bladder procedure Eliquis held at this time due to [facility], may resume after SAH/ICH resolves  Hypertension Home meds: Irbesartan 300 mg daily Stable BP goal less than 160 given SAH Long-term BP goal normotensive  Lipid management Home meds: None LDL 58, goal < 70 No statin needed at this time given LDL at goal and advanced age  Other Stroke Risk Factors Advanced Age >/= 70  Obesity, Body mass index is 37.16 kg/m., BMI >/= 30 associated with increased stroke risk, recommend weight loss, diet and exercise as appropriate   Other Active Problems Bladder cancer with TURBT 02/05/2023-monitor for hematuria Urine is straw colored History of left breast cancer status post mastectomy and chemotherapy in 2008 Remote head and neck radiation in 1940s Status post left thyroidectomy Status post  right inferior parathyroidectomy  Hospital day # 6  Patient seen and examined by NP/APP with MD. MD to update note as needed.   Loretta Gates , MSN, AGACNP-BC [facility] See Amion for schedule and pager information 02/12/2023 10:50 AM I have personally obtained history,examined this patient, reviewed notes, independently viewed imaging studies, participated in medical decision making and plan of care.ROS completed by me personally and pertinent positives fully documented  I have made any additions or clarifications directly to the above note. Agree with note above.  Patient appears medically stable for transfer to rehab when bed available.  [name], MD Medical Director [facility] Pager: [phone] 02/12/2023 1:19 PM   To contact Stroke Continuity provider, please refer to [email or web address]. After hours, contact General Neurology

## 2023-02-12 NOTE — Progress Notes (Signed)
Occupational Therapy Treatment Patient Details Name: Loretta Gates MRN: [id number] DOB: 1935-12-18 Today's Date: 02/12/2023   History of present illness Loretta Gates is a 87 y.o. female who presenting 4/30 with transient changes in vision. 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 PMH: atrial fibrillation on Eliquis (last dose 4/30 a.m. s/p failed cardioversion 1 to 2 months ago), hypertension bladder cancer s/p TURBT (02/05/2023), heart failure, chronic venous insufficiency, left breast cancer s/p chemotherapy and mastectomy (2008), remote head/neck radiation (1940s at age 57), left thyroidectomy and right inferior parathyroidectomy, anemia, mild neuropathy   OT comments  Session completed along with PT in order to progress functional mobility. Pt awake and alert upon entering room with husband visiting at bedside. Session focused on right side visual scanning and attention during teeth brushing task at sink. Pt provided with verbal, tactile, and auditory cueing to scan to the right when locating items at sink. OT provided VC for step sequencing during grooming task. Pt presents with heavy right lateral lean when completing dual task and was provided with increased physical assist to maintain standing balance. Pt will continue to benefit from intensive inpatient follow up therapy, >3 hours/day. OT will continue to follow patient acutely.     Recommendations for follow up therapy are one component of a multi-disciplinary discharge planning process, led by the attending physician.  Recommendations may be updated based on patient status, additional functional criteria and insurance authorization.    Assistance Recommended at Discharge Frequent or constant Supervision/Assistance  Patient can return home with the following  Two people to help with walking and/or transfers;Two people to help with bathing/dressing/bathroom;Assistance with cooking/housework;Help with  stairs or ramp for entrance;Assistance with feeding;Assist for transportation;Direct supervision/assist for financial management;Direct supervision/assist for medications management   Equipment Recommendations  Other (comment) (will continue to assess)    Recommendations for Other Services Rehab consult    Precautions / Restrictions Precautions Precautions: Fall Precaution Comments: decreased right visual and physical attention Restrictions Weight Bearing Restrictions: No       Mobility Bed Mobility Overal bed mobility: Needs Assistance Bed Mobility: Supine to Sit     Supine to sit: Mod assist, +2 for physical assistance, HOB elevated     General bed mobility comments: VC for technique to transition to sitting on EOB. HHA to reach across midline and grab bed rail on right side with left hand. Patient Response: Cooperative  Transfers Overall transfer level: Needs assistance Equipment used: 2 person hand held assist Transfers: Sit to/from Stand, Bed to chair/wheelchair/BSC Sit to Stand: Mod assist, +2 physical assistance, From elevated surface     Step pivot transfers: Max assist, +2 physical assistance     General transfer comment: 2 person assist provided for sit to stand transition (mod A x1 and Min guard x1). VC for sequencing. Pt completed visual scan towards right to find recliner before to transfer.     Balance Overall balance assessment: Needs assistance Sitting-balance support: Single extremity supported, Feet supported Sitting balance-Leahy Scale: Poor Sitting balance - Comments: Intermittent physical assist to maintain sitting balance. Posterior lean noted if LOB occurred. Postural control: Posterior lean, Right lateral lean Standing balance support: Bilateral upper extremity supported, During functional activity Standing balance-Leahy Scale: Zero Standing balance comment: Standing at sink to complete grooming task. Pt reliant on therapists and leaning on  countertop to maintain balance. Demonstrated right lateral lean during functional task.       ADL either performed or assessed with  clinical judgement   ADL       Grooming: Oral care;Standing;Moderate assistance Grooming Details (indicate cue type and reason): brushing teeth while standing at sink. 2 person assist to maintain standing balance. Supplies placed in right visual field to encourage visual scanning. Max difficulty and increased time required to scan and locate items.                Vision Baseline Vision/History: 1 Wears glasses Ability to See in Adequate Light: 0 Adequate Eye Alignment: Impaired (comment) Ocular Range of Motion: Restricted on the right Alignment/Gaze Preference: Gaze left;Head turned Tracking/Visual Pursuits: Decreased smoothness of horizontal tracking;Decreased smoothness of vertical tracking Visual Fields: Right visual field deficit   Perception Perception Perception: Impaired Inattention/Neglect: Does not attend to right visual field;Does not attend to right side of body Spatial Orientation: impaired   Praxis Praxis Praxis: Impaired Praxis Impairment Details: Motor planning    Cognition Arousal/Alertness: Awake/alert Behavior During Therapy: WFL for tasks assessed/performed Overall Cognitive Status: Impaired/Different from baseline Area of Impairment: Attention, Memory, Following commands, Safety/judgement, Awareness, Problem solving, Orientation      Orientation Level: Disoriented to, Situation Current Attention Level: Focused Memory: Decreased short-term memory Following Commands: Follows one step commands with increased time Safety/Judgement: Decreased awareness of deficits, Decreased awareness of safety   Problem Solving: Slow processing, Requires tactile cues, Requires verbal cues, Difficulty sequencing, Decreased initiation General Comments: Demonstrated difficulty moving on from one topic to the next. Would talk about  tpoic/subject that had passed and was not aware that conversation topic had changed.              General Comments Husband, John present during session    Pertinent Vitals/ Pain       Pain Assessment Pain Assessment: Faces Faces Pain Scale: Hurts a little bit Pain Location: Would say "ouch" periodically although when asked what hurts, she stated, "nothing." Pain Descriptors / Indicators: Grimacing Pain Intervention(s): Monitored during session         Frequency  Min 2X/week        Progress Toward Goals  OT Goals(current goals can now be found in the care plan section)  Progress towards OT goals: Progressing toward goals     Plan Discharge plan remains appropriate;Frequency remains appropriate    Co-evaluation    PT/OT/SLP Co-Evaluation/Treatment: Yes Reason for Co-Treatment: For patient/therapist safety;To address functional/ADL transfers   OT goals addressed during session: ADL's and self-care;Strengthening/ROM      AM-PAC OT "6 Clicks" Daily Activity     Outcome Measure   Help from another person eating meals?: A Little Help from another person taking care of personal grooming?: A Lot Help from another person toileting, which includes using toliet, bedpan, or urinal?: Total Help from another person bathing (including washing, rinsing, drying)?: Total Help from another person to put on and taking off regular upper body clothing?: A Lot Help from another person to put on and taking off regular lower body clothing?: Total 6 Click Score: 10    End of Session Equipment Utilized During Treatment: Gait belt  OT Visit Diagnosis: Muscle weakness (generalized) (M62.81);Ataxia, unspecified (R27.0);Other symptoms and signs involving cognitive function;Hemiplegia and hemiparesis Hemiplegia - Right/Left: Right Hemiplegia - dominant/non-dominant: Dominant Hemiplegia - caused by: Nontraumatic SAH   Activity Tolerance Patient tolerated treatment well   Patient Left  in chair;with call bell/phone within reach;with chair alarm set;with family/visitor present           Time: 1104-1140 OT Time Calculation (min): 36 min  Charges: OT General Charges $OT Visit: 1 Visit OT Treatments $Self Care/Home Management : 8-22 mins  Limmie Patricia, OTR/L,CBIS  Supplemental OT - [facility] and [facility] Secure Chat Preferred    Brandilynn Taormina, Charisse March 02/12/2023, 12:15 PM

## 2023-02-12 NOTE — Progress Notes (Signed)
Inpatient Rehab Admissions Coordinator:   Following for my colleague, Ottie Glazier.  Will see how pt does with therapy today to see if we can get insurance auth started.    Estill Dooms, PT, DPT Admissions Coordinator [phone] 02/12/23  10:05 AM

## 2023-02-12 NOTE — Progress Notes (Signed)
Speech Language Pathology Treatment: Dysphagia;Cognitive-Linquistic  Patient Details Name: Loretta Gates MRN: [id number] DOB: 12/28/1935 Today's Date: 02/12/2023 Time: 7829-5621 SLP Time Calculation (min) (ACUTE ONLY): 14 min  Assessment / Plan / Recommendation Clinical Impression  ST requested to see today per family. Pt is making good progress since last session and was alert, upright in bed. Her speech was 100% intelligible slightly distorted from baseline. She is oriented to situation, month and year. She needs intermittent cues for anticipatory awareness. She needed min cues for intellectual awareness to state decreased weakness on right lower extremity and for functional implications of weakness. Min assist for use of call bell for assistance.  Trials of regular texture given with mastication on left stronger side independently and swift transit and able to clear oral cavity. Cues given to check right side for potential pocketing. No s/s aspiration with cup or straw sips thin water. SLP will upgrade to regular texture (family always present if needs to have meats chopped), continue thin liquids, pills whole in puree.    HPI HPI: Loretta Gates is a 87 y.o. female with a past medical history significant for atrial fibrillation on Eliquis (last dose 4/30 a.m. s/p failed cardioversion 1 to 2 months ago), hypertension bladder cancer s/p TURBT (02/05/2023), heart failure, chronic venous insufficiency, left breast cancer s/p chemotherapy and mastectomy (2008), remote head/neck radiation (1940s at age 77), left thyroidectomy and right inferior parathyroidectomy, anemia, mild neuropathy. Prior to admission, she had several episodes of transient vision loss with rapid return to baseline. Due to recurrent symptoms at [facility] she was emergently evaluated by video neurology and decision was made to emergently assess her left carotid occlusion with potential for stent placement due to concern for  stump embolization versus collateral failure versus acute occlusion. s/p Status post left common carotid arteriogram right common carotid arteriogram and right vertebral arteriogram 5/1.      SLP Plan  Continue with current plan of care      Recommendations for follow up therapy are one component of a multi-disciplinary discharge planning process, led by the attending physician.  Recommendations may be updated based on patient status, additional functional criteria and insurance authorization.    Recommendations  Diet recommendations: Regular;Thin liquid Liquids provided via: Cup;Straw Medication Administration: Whole meds with puree Supervision: Patient able to self feed;Intermittent supervision to cue for compensatory strategies Compensations: Slow rate;Small sips/bites Postural Changes and/or Swallow Maneuvers: Seated upright 90 degrees                Rehab consult Oral care BID   Frequent or constant Supervision/Assistance Dysphagia, oropharyngeal phase (R13.12);Cognitive communication deficit (H08.657)     Continue with current plan of care     Royce Macadamia  02/12/2023, 10:42 AM

## 2023-02-12 NOTE — Progress Notes (Signed)
Inpatient Rehab Admissions Coordinator:   Pt did well with therapy this morning and able to tolerate all disciplines.  Will start auth with [facility].    Estill Dooms, PT, DPT Admissions Coordinator [phone] 02/12/23  12:44 PM

## 2023-02-12 NOTE — Progress Notes (Signed)
Physical Therapy Treatment Patient Details Name: Loretta Gates MRN: [id number] DOB: 1936-07-25 Today's Date: 02/12/2023   History of Present Illness Loretta Gates is a 87 y.o. female who presenting 4/30 with transient changes in vision. 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 PMH: atrial fibrillation on Eliquis (last dose 4/30 a.m. s/p failed cardioversion 1 to 2 months ago), hypertension bladder cancer s/p TURBT (02/05/2023), heart failure, chronic venous insufficiency, left breast cancer s/p chemotherapy and mastectomy (2008), remote head/neck radiation (1940s at age 44), left thyroidectomy and right inferior parathyroidectomy, anemia, mild neuropathy    PT Comments    The pt presents with markedly improved alertness and participation this session. She was able to assist with bed mobility, demos improved seated balance, and was able to progress to ambulating short distances in the room with mod-maxA of 2. The pt demos significant improvements in activity tolerance, but remains with decreased attention to Right side and needs mod-maxA for standing balance both with static standing and gait. Continue to recommend intensive therapies as pt is highly motivated and making great progress in ability to tolerate activity at this time.     Recommendations for follow up therapy are one component of a multi-disciplinary discharge planning process, led by the attending physician.  Recommendations may be updated based on patient status, additional functional criteria and insurance authorization.  Follow Up Recommendations       Assistance Recommended at Discharge Frequent or constant Supervision/Assistance  Patient can return home with the following A little help with walking and/or transfers;A little help with bathing/dressing/bathroom;Assist for transportation;Help with stairs or ramp for entrance   Equipment Recommendations  None recommended by PT     Recommendations for Other Services       Precautions / Restrictions Precautions Precautions: Fall Precaution Comments: decreased right visual and physical attention Restrictions Weight Bearing Restrictions: No     Mobility  Bed Mobility Overal bed mobility: Needs Assistance Bed Mobility: Supine to Sit     Supine to sit: Mod assist, +2 for physical assistance, HOB elevated     General bed mobility comments: VC for technique to transition to sitting on EOB. HHA to reach across midline and grab bed rail on right side with left hand.    Transfers Overall transfer level: Needs assistance Equipment used: 2 person hand held assist Transfers: Sit to/from Stand, Bed to chair/wheelchair/BSC Sit to Stand: Mod assist, +2 physical assistance, From elevated surface   Step pivot transfers: Max assist, +2 physical assistance       General transfer comment: 2 person assist provided for sit to stand transition (mod A x1 and Min guard x1). VC for sequencing. Pt completed visual scan towards right to find recliner before to transfer.    Ambulation/Gait Ambulation/Gait assistance: Mod assist, +2 physical assistance, +2 safety/equipment Gait Distance (Feet): 5 Feet (+ 5 ft) Assistive device: 2 person hand held assist Gait Pattern/deviations: Step-to pattern, Decreased stride length, Decreased dorsiflexion - right, Decreased weight shift to right, Knee flexed in stance - right, Narrow base of support Gait velocity: decreased Gait velocity interpretation: <1.31 ft/sec, indicative of household ambulator   General Gait Details: pt with small steps and minimal clearance. max directional cues and assist due to R lateral lean.  Modified Rankin (Stroke Patients Only) Modified Rankin (Stroke Patients Only) Pre-Morbid Rankin Score: No significant disability Modified Rankin: Moderately severe disability     Balance Overall balance assessment: Needs assistance Sitting-balance support: Single  extremity supported,  Feet supported Sitting balance-Leahy Scale: Poor Sitting balance - Comments: Intermittent physical assist to maintain sitting balance. Posterior lean noted if LOB occurred. Postural control: Posterior lean, Right lateral lean Standing balance support: Bilateral upper extremity supported, During functional activity Standing balance-Leahy Scale: Zero Standing balance comment: Standing at sink to complete grooming task. Pt reliant on therapists and leaning on countertop to maintain balance. Demonstrated right lateral lean during functional task.                            Cognition Arousal/Alertness: Awake/alert Behavior During Therapy: WFL for tasks assessed/performed Overall Cognitive Status: Impaired/Different from baseline Area of Impairment: Attention, Memory, Following commands, Safety/judgement, Awareness, Problem solving, Orientation                 Orientation Level: Disoriented to, Situation Current Attention Level: Focused Memory: Decreased short-term memory Following Commands: Follows one step commands with increased time Safety/Judgement: Decreased awareness of deficits, Decreased awareness of safety   Problem Solving: Slow processing, Requires tactile cues, Requires verbal cues, Difficulty sequencing, Decreased initiation General Comments: Demonstrated difficulty moving on from one topic to the next. Would talk about tpoic/subject that had passed and was not aware that conversation topic had changed.        Exercises      General Comments General comments (skin integrity, edema, etc.): husband present and supportive      Pertinent Vitals/Pain Pain Assessment Pain Assessment: Faces Faces Pain Scale: Hurts a little bit Pain Location: Would say "ouch" periodically although when asked what hurts, she stated, "nothing." Pain Descriptors / Indicators: Grimacing Pain Intervention(s): Limited activity within patient's tolerance      PT Goals (current goals can now be found in the care plan section) Acute Rehab PT Goals Patient Stated Goal: feel better PT Goal Formulation: With patient/family Time For Goal Achievement: 02/21/23 Potential to Achieve Goals: Fair Progress towards PT goals: Progressing toward goals    Frequency    Min 4X/week      PT Plan Current plan remains appropriate    Co-evaluation PT/OT/SLP Co-Evaluation/Treatment: Yes Reason for Co-Treatment: For patient/therapist safety;To address functional/ADL transfers PT goals addressed during session: Mobility/safety with mobility;Balance;Strengthening/ROM OT goals addressed during session: ADL's and self-care;Strengthening/ROM      AM-PAC PT "6 Clicks" Mobility   Outcome Measure  Help needed turning from your back to your side while in a flat bed without using bedrails?: A Lot Help needed moving from lying on your back to sitting on the side of a flat bed without using bedrails?: A Lot Help needed moving to and from a bed to a chair (including a wheelchair)?: Total Help needed standing up from a chair using your arms (e.g., wheelchair or bedside chair)?: Total Help needed to walk in hospital room?: Total Help needed climbing 3-5 steps with a railing? : Total 6 Click Score: 8    End of Session Equipment Utilized During Treatment: Gait belt Activity Tolerance: Patient limited by fatigue;Patient tolerated treatment well Patient left: with call bell/phone within reach;with family/visitor present;in chair;with chair alarm set Nurse Communication: Mobility status PT Visit Diagnosis: Unsteadiness on feet (R26.81);Muscle weakness (generalized) (M62.81);Other symptoms and signs involving the nervous system (Z61.096)     Time: 0454-0981 PT Time Calculation (min) (ACUTE ONLY): 35 min  Charges:  $Gait Training: 8-22 mins                     Vickki Muff, PT, DPT  Acute Rehabilitation Department Office [phone] Secure Chat  Communication Preferred   Ronnie Derby 02/12/2023, 1:11 PM

## 2023-02-13 ENCOUNTER — Encounter (HOSPITAL_COMMUNITY): Payer: Self-pay | Admitting: Physical Medicine and Rehabilitation

## 2023-02-13 ENCOUNTER — Other Ambulatory Visit: Payer: Self-pay

## 2023-02-13 ENCOUNTER — Inpatient Hospital Stay (HOSPITAL_COMMUNITY)
Admission: RE | Admit: 2023-02-13 | Discharge: 2023-02-25 | DRG: 057 | Disposition: A | Discharge status: Home Health | Payer: Medicare HMO | Source: Intra-hospital | Attending: Physical Medicine and Rehabilitation | Admitting: Physical Medicine and Rehabilitation

## 2023-02-13 DIAGNOSIS — D649 Anemia, unspecified: Secondary | ICD-10-CM | POA: Diagnosis present

## 2023-02-13 DIAGNOSIS — R414 Neurologic neglect syndrome: Secondary | ICD-10-CM | POA: Diagnosis not present

## 2023-02-13 DIAGNOSIS — Z853 Personal history of malignant neoplasm of breast: Secondary | ICD-10-CM | POA: Diagnosis not present

## 2023-02-13 DIAGNOSIS — I5033 Acute on chronic diastolic (congestive) heart failure: Secondary | ICD-10-CM | POA: Diagnosis not present

## 2023-02-13 DIAGNOSIS — Z6835 Body mass index (BMI) 35.0-35.9, adult: Secondary | ICD-10-CM

## 2023-02-13 DIAGNOSIS — Z7901 Long term (current) use of anticoagulants: Secondary | ICD-10-CM | POA: Diagnosis not present

## 2023-02-13 DIAGNOSIS — Z8551 Personal history of malignant neoplasm of bladder: Secondary | ICD-10-CM

## 2023-02-13 DIAGNOSIS — Z9012 Acquired absence of left breast and nipple: Secondary | ICD-10-CM

## 2023-02-13 DIAGNOSIS — B372 Candidiasis of skin and nail: Secondary | ICD-10-CM | POA: Diagnosis not present

## 2023-02-13 DIAGNOSIS — Z6837 Body mass index (BMI) 37.0-37.9, adult: Secondary | ICD-10-CM | POA: Diagnosis not present

## 2023-02-13 DIAGNOSIS — I618 Other nontraumatic intracerebral hemorrhage: Secondary | ICD-10-CM | POA: Diagnosis not present

## 2023-02-13 DIAGNOSIS — K5900 Constipation, unspecified: Secondary | ICD-10-CM | POA: Diagnosis not present

## 2023-02-13 DIAGNOSIS — E669 Obesity, unspecified: Secondary | ICD-10-CM | POA: Diagnosis present

## 2023-02-13 DIAGNOSIS — C679 Malignant neoplasm of bladder, unspecified: Secondary | ICD-10-CM | POA: Diagnosis not present

## 2023-02-13 DIAGNOSIS — I69012 Visuospatial deficit and spatial neglect following nontraumatic subarachnoid hemorrhage: Principal | ICD-10-CM

## 2023-02-13 DIAGNOSIS — R4589 Other symptoms and signs involving emotional state: Secondary | ICD-10-CM

## 2023-02-13 DIAGNOSIS — Z7984 Long term (current) use of oral hypoglycemic drugs: Secondary | ICD-10-CM | POA: Diagnosis not present

## 2023-02-13 DIAGNOSIS — Z9221 Personal history of antineoplastic chemotherapy: Secondary | ICD-10-CM

## 2023-02-13 DIAGNOSIS — R7303 Prediabetes: Secondary | ICD-10-CM | POA: Diagnosis not present

## 2023-02-13 DIAGNOSIS — I482 Chronic atrial fibrillation, unspecified: Secondary | ICD-10-CM | POA: Diagnosis not present

## 2023-02-13 DIAGNOSIS — Z7982 Long term (current) use of aspirin: Secondary | ICD-10-CM | POA: Diagnosis not present

## 2023-02-13 DIAGNOSIS — I6522 Occlusion and stenosis of left carotid artery: Secondary | ICD-10-CM | POA: Diagnosis not present

## 2023-02-13 DIAGNOSIS — I69020 Aphasia following nontraumatic subarachnoid hemorrhage: Secondary | ICD-10-CM

## 2023-02-13 DIAGNOSIS — H53122 Transient visual loss, left eye: Secondary | ICD-10-CM | POA: Diagnosis not present

## 2023-02-13 DIAGNOSIS — Z7409 Other reduced mobility: Secondary | ICD-10-CM | POA: Diagnosis not present

## 2023-02-13 DIAGNOSIS — I6602 Occlusion and stenosis of left middle cerebral artery: Secondary | ICD-10-CM

## 2023-02-13 DIAGNOSIS — I63512 Cerebral infarction due to unspecified occlusion or stenosis of left middle cerebral artery: Principal | ICD-10-CM | POA: Diagnosis present

## 2023-02-13 DIAGNOSIS — I69092 Facial weakness following nontraumatic subarachnoid hemorrhage: Secondary | ICD-10-CM

## 2023-02-13 DIAGNOSIS — K219 Gastro-esophageal reflux disease without esophagitis: Secondary | ICD-10-CM | POA: Diagnosis present

## 2023-02-13 DIAGNOSIS — R58 Hemorrhage, not elsewhere classified: Secondary | ICD-10-CM | POA: Diagnosis not present

## 2023-02-13 DIAGNOSIS — I480 Paroxysmal atrial fibrillation: Secondary | ICD-10-CM | POA: Diagnosis not present

## 2023-02-13 DIAGNOSIS — Z79899 Other long term (current) drug therapy: Secondary | ICD-10-CM | POA: Diagnosis not present

## 2023-02-13 LAB — GLUCOSE, CAPILLARY: Glucose-Capillary: 95 mg/dL (ref 70–99)

## 2023-02-13 MED ORDER — ENOXAPARIN SODIUM 40 MG/0.4ML IJ SOSY: 40.0000 mg | PREFILLED_SYRINGE | INTRAMUSCULAR | Status: DC

## 2023-02-13 MED ORDER — QUETIAPINE FUMARATE 25 MG PO TABS: 25.0000 mg | ORAL_TABLET | Freq: Every evening | ORAL | 0 refills | Status: DC | PRN

## 2023-02-13 MED ORDER — ASPIRIN 81 MG PO CHEW: 81.0000 mg | CHEWABLE_TABLET | Freq: Every day | ORAL | 0 refills | Status: DC

## 2023-02-13 MED ORDER — L-METHYLFOLATE-B6-B12 3-35-2 MG PO TABS
1.0000 | ORAL_TABLET | Freq: Every day | ORAL | Status: DC
  Administered 2023-02-14 – 2023-02-25 (×12): 1 via ORAL
  Filled 2023-02-13 (×12): qty 1

## 2023-02-13 MED ORDER — INSULIN ASPART 100 UNIT/ML IJ SOLN: 0.0000 [IU] | Freq: Three times a day (TID) | INTRAMUSCULAR | Status: DC

## 2023-02-13 MED ORDER — ACETAMINOPHEN 160 MG/5ML PO SOLN: 650.0000 mg | ORAL | Status: DC | PRN

## 2023-02-13 MED ORDER — ASPIRIN 81 MG PO CHEW
81.0000 mg | CHEWABLE_TABLET | Freq: Every day | ORAL | Status: DC
  Administered 2023-02-14 – 2023-02-18 (×5): 81 mg via ORAL
  Filled 2023-02-13 (×6): qty 1

## 2023-02-13 MED ORDER — ASPIRIN 81 MG PO CHEW: 81.0000 mg | CHEWABLE_TABLET | Freq: Every day | ORAL | Status: DC

## 2023-02-13 MED ORDER — METOPROLOL TARTRATE 75 MG PO TABS: 75.0000 mg | ORAL_TABLET | Freq: Two times a day (BID) | ORAL | 0 refills | Status: DC

## 2023-02-13 MED ORDER — QUETIAPINE FUMARATE 25 MG PO TABS: 25.0000 mg | ORAL_TABLET | Freq: Every evening | ORAL | Status: DC | PRN

## 2023-02-13 MED ORDER — SENNOSIDES-DOCUSATE SODIUM 8.6-50 MG PO TABS: 1.0000 | ORAL_TABLET | Freq: Every evening | ORAL | 0 refills | Status: DC | PRN

## 2023-02-13 MED ORDER — PANTOPRAZOLE SODIUM 40 MG PO TBEC
40.0000 mg | DELAYED_RELEASE_TABLET | Freq: Every day | ORAL | Status: DC
  Administered 2023-02-14 – 2023-02-25 (×12): 40 mg via ORAL
  Filled 2023-02-13 (×12): qty 1

## 2023-02-13 MED ORDER — ENOXAPARIN SODIUM 40 MG/0.4ML IJ SOSY
40.0000 mg | PREFILLED_SYRINGE | INTRAMUSCULAR | Status: DC
  Administered 2023-02-14 – 2023-02-16 (×3): 40 mg via SUBCUTANEOUS
  Filled 2023-02-13 (×3): qty 0.4

## 2023-02-13 MED ORDER — METOPROLOL TARTRATE 50 MG PO TABS
75.0000 mg | ORAL_TABLET | Freq: Two times a day (BID) | ORAL | Status: DC
  Administered 2023-02-13 – 2023-02-25 (×24): 75 mg via ORAL
  Filled 2023-02-13 (×25): qty 1

## 2023-02-13 MED ORDER — TICAGRELOR 90 MG PO TABS
45.0000 mg | ORAL_TABLET | Freq: Two times a day (BID) | ORAL | Status: DC
  Administered 2023-02-13 – 2023-02-25 (×24): 45 mg via ORAL
  Filled 2023-02-13 (×24): qty 1

## 2023-02-13 MED ORDER — OXYBUTYNIN CHLORIDE ER 5 MG PO TB24
15.0000 mg | ORAL_TABLET | Freq: Every day | ORAL | Status: DC
  Administered 2023-02-13 – 2023-02-24 (×12): 15 mg via ORAL
  Filled 2023-02-13 (×12): qty 1

## 2023-02-13 MED ORDER — SENNOSIDES-DOCUSATE SODIUM 8.6-50 MG PO TABS
1.0000 | ORAL_TABLET | Freq: Every evening | ORAL | Status: DC | PRN
  Administered 2023-02-19 – 2023-02-23 (×3): 1 via ORAL
  Filled 2023-02-13 (×3): qty 1

## 2023-02-13 MED ORDER — ACETAMINOPHEN 325 MG PO TABS
650.0000 mg | ORAL_TABLET | ORAL | Status: DC | PRN
  Administered 2023-02-17: 650 mg via ORAL
  Filled 2023-02-13: qty 2

## 2023-02-13 MED ORDER — ACETAMINOPHEN 650 MG RE SUPP: 650.0000 mg | RECTAL | Status: DC | PRN

## 2023-02-13 MED ORDER — CHOLESTYRAMINE 4 G PO PACK
4.0000 g | PACK | Freq: Every day | ORAL | Status: DC
  Administered 2023-02-13 – 2023-02-24 (×12): 4 g via ORAL
  Filled 2023-02-13 (×12): qty 1

## 2023-02-13 NOTE — Progress Notes (Signed)
Physical Therapy Treatment Patient Details Name: Loretta Gates MRN: [id number] DOB: [birth year] Today's Date: 02/13/2023   History of Present Illness Loretta Gates is a 87 y.o. female who presenting 4/30 with transient changes in vision. 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 PMH: atrial fibrillation on Eliquis (last dose 4/30 a.m. s/p failed cardioversion 1 to 2 months ago), hypertension bladder cancer s/p TURBT (02/05/2023), heart failure, chronic venous insufficiency, left breast cancer s/p chemotherapy and mastectomy (2008), remote head/neck radiation (1940s at age 76), left thyroidectomy and right inferior parathyroidectomy, anemia, mild neuropathy    PT Comments    Pt received in recliner with husband and daughter present. Worked with pt on verbalizing her deficits and how to tell people to help her. She was able to demo improvement with this with practice. Mod A needed on L side with sit>stand, min A on R. Pt ambulated 6' to sink/ mirror. Standing balance and postural exercises performed in standing at mirror with 3 seated rest breaks. Pt ambulated 6' back to chair with mod A +2. Patient will benefit from intensive inpatient follow up therapy, >3 hours/day PT will continue to follow.    Recommendations for follow up therapy are one component of a multi-disciplinary discharge planning process, led by the attending physician.  Recommendations may be updated based on patient status, additional functional criteria and insurance authorization.  Follow Up Recommendations       Assistance Recommended at Discharge Frequent or constant Supervision/Assistance  Patient can return home with the following A little help with walking and/or transfers;A little help with bathing/dressing/bathroom;Assist for transportation;Help with stairs or ramp for entrance   Equipment Recommendations  None recommended by PT    Recommendations for Other Services Rehab  consult     Precautions / Restrictions Precautions Precautions: Fall Precaution Comments: decreased right visual and physical attention Restrictions Weight Bearing Restrictions: No     Mobility  Bed Mobility               General bed mobility comments: received in recliner    Transfers Overall transfer level: Needs assistance Equipment used: 2 person hand held assist Transfers: Sit to/from Stand, Bed to chair/wheelchair/BSC Sit to Stand: Mod assist, +2 physical assistance, From elevated surface           General transfer comment: 2 person assist provided for sit to stand, needed mod A on R, min A on L. Performed >3 times from recliner and small armchair in room    Ambulation/Gait Ambulation/Gait assistance: Mod assist, +2 physical assistance, +2 safety/equipment Gait Distance (Feet): 6 Feet (2x) Assistive device: 2 person hand held assist Gait Pattern/deviations: Step-to pattern, Decreased stride length, Decreased dorsiflexion - right, Decreased weight shift to right, Knee flexed in stance - right, Narrow base of support Gait velocity: decreased Gait velocity interpretation: <1.31 ft/sec, indicative of household ambulator   General Gait Details: pt with small steps and minimal clearance. max directional cues and assist due to R lateral lean.   Stairs             Wheelchair Mobility    Modified Rankin (Stroke Patients Only) Modified Rankin (Stroke Patients Only) Pre-Morbid Rankin Score: No significant disability Modified Rankin: Moderately severe disability     Balance Overall balance assessment: Needs assistance Sitting-balance support: Single extremity supported, Feet supported Sitting balance-Leahy Scale: Fair Sitting balance - Comments: able to maintain balance sitting edge of chair Postural control: Posterior lean, Right lateral lean Standing balance  support: Bilateral upper extremity supported, During functional activity Standing  balance-Leahy Scale: Poor Standing balance comment: mod A needed on L side as well as B UE support. Stood at sink working on erect posture and reaching with LUE and assisted reaching with RUE                            Cognition Arousal/Alertness: Awake/alert Behavior During Therapy: WFL for tasks assessed/performed Overall Cognitive Status: Impaired/Different from baseline Area of Impairment: Attention, Memory, Following commands, Safety/judgement, Awareness, Problem solving, Orientation                 Orientation Level: Disoriented to, Situation Current Attention Level: Focused Memory: Decreased short-term memory Following Commands: Follows one step commands with increased time Safety/Judgement: Decreased awareness of deficits, Decreased awareness of safety   Problem Solving: Slow processing, Requires tactile cues, Requires verbal cues, Difficulty sequencing, Decreased initiation General Comments: difficult to assess due to expressive aphasia, frequently says, "that was awkward" in multiple situations        Exercises      General Comments General comments (skin integrity, edema, etc.): VSS. Pt kept saying she's not trying to be uncooperative though she as cooperative throughout      Pertinent Vitals/Pain Pain Assessment Pain Assessment: Faces Faces Pain Scale: Hurts a little bit Pain Location: Would say "ouch" periodically, could not state where she hurt but seemed to be with changes in LE position. Daughter also relays that she just received abdominal injection Pain Descriptors / Indicators: Grimacing Pain Intervention(s): Monitored during session    Home Living                          Prior Function            PT Goals (current goals can now be found in the care plan section) Acute Rehab PT Goals Patient Stated Goal: feel better PT Goal Formulation: With patient/family Time For Goal Achievement: 02/21/23 Potential to Achieve Goals:  Fair Progress towards PT goals: Progressing toward goals    Frequency    Min 4X/week      PT Plan Current plan remains appropriate    Co-evaluation              AM-PAC PT "6 Clicks" Mobility   Outcome Measure  Help needed turning from your back to your side while in a flat bed without using bedrails?: A Lot Help needed moving from lying on your back to sitting on the side of a flat bed without using bedrails?: A Lot Help needed moving to and from a bed to a chair (including a wheelchair)?: Total Help needed standing up from a chair using your arms (e.g., wheelchair or bedside chair)?: Total Help needed to walk in hospital room?: Total Help needed climbing 3-5 steps with a railing? : Total 6 Click Score: 8    End of Session Equipment Utilized During Treatment: Gait belt Activity Tolerance: Patient limited by fatigue;Patient tolerated treatment well Patient left: with call bell/phone within reach;with family/visitor present;in chair;with chair alarm set Nurse Communication: Mobility status PT Visit Diagnosis: Unsteadiness on feet (R26.81);Muscle weakness (generalized) (M62.81);Other symptoms and signs involving the nervous system (Z61.096)     Time: 0454-0981 PT Time Calculation (min) (ACUTE ONLY): 25 min  Charges:  $Gait Training: 8-22 mins $Therapeutic Activity: 8-22 mins  Lyanne Co, PT  Acute Rehab Services Secure chat preferred Office [phone]    Elyse Hsu 02/13/2023, 1:58 PM

## 2023-02-13 NOTE — Progress Notes (Addendum)
STROKE TEAM PROGRESS NOTE   INTERVAL HISTORY Patient is seen in her room with her daughter and her husband at the bedside.  She has been hemodynamically stable, and her neurological exam remains stable.  She is medically stable for discharge to CIR and is awaiting insurance authorization.  Vitals:   02/12/23 2326 02/13/23 0326 02/13/23 0755 02/13/23 0801  BP: (!) 150/82 (!) 150/73 127/75 127/75  Pulse: 89 80 86 79  Resp: 17 18 18 18   Temp: 98.4 F (36.9 C) 98.5 F (36.9 C) 97.8 F (36.6 C) 98.7 F (37.1 C)  TempSrc: Oral Oral Oral Oral  SpO2: 95% 94% 95% 97%  Weight:      Height:       CBC:  Recent Labs  Lab 02/06/23 1532 02/07/23 0128 02/10/23 0813  WBC 13.6* 13.0* 10.1  NEUTROABS 10.3* 12.3*  --   HGB 13.9 13.8 13.4  HCT 44.4 43.8 41.6  MCV 87.9 87.4 86.0  PLT 244 224 238    Basic Metabolic Panel:  Recent Labs  Lab 02/07/23 0128 02/10/23 0813  NA 141 137  K 4.2 3.8  CL 111 106  CO2 22 19*  GLUCOSE 149* 108*  BUN 15 13  CREATININE 0.72 0.59  CALCIUM 8.4* 9.3    Lipid Panel:  Recent Labs  Lab 02/07/23 0128  CHOL 122  TRIG 92  HDL 46  CHOLHDL 2.7  VLDL 18  LDLCALC 58    HgbA1c:  Recent Labs  Lab 02/07/23 0128  HGBA1C 6.2*    Urine Drug Screen:  Recent Labs  Lab 02/07/23 0132  LABOPIA NONE DETECTED  COCAINSCRNUR NONE DETECTED  LABBENZ NONE DETECTED  AMPHETMU NONE DETECTED  THCU NONE DETECTED  LABBARB NONE DETECTED     Alcohol Level No results for input(s): "ETH" in the last 168 hours.  IMAGING past 24 hours No results found.  PHYSICAL EXAM General: Alert, well-nourished, well-developed elderly Caucasian lady in no acute distress Respiratory: Regular, unlabored respirations on room air  NEURO:  Mental Status: AA&Ox3 Speech/Language: Speech is without dysarthria or aphasia.   Right-sided neglect not evident today with patient able to move right side on command  Cranial Nerves:  II: PERRL.  Right hemianopsia III, IV,  ZO:[id number] VII: Slight right facial droop VIII: Hearing intact to voice. IX, X: Phonation is normal.  XII: Tongue protrudes midline without fasciculations. Motor: 5/5 strength to left upper and lower extremities, 3/5 strength to right upper extremity and 4/5 right lower extremities, drift noted in right upper and lower extremities Tone: is normal and bulk is normal Sensation-diminished in right leg but equal in upper body Gait- deferred for patient's safety  ASSESSMENT/PLAN [name] is a 87 y.o. female with history of A-fib on Eliquis, hypertension, bladder cancer status post TURBT on 4/29, CHF, chronic venous insufficiency, left breast cancer status post chemo and mastectomy, left thyroidectomy, right inferior parathyroidectomy, anemia and neuropathy.  On Sunday, she had an episode of amaurosis fugax followed by a repeat episode of this yesterday.  Of note, she is taking Eliquis for atrial fibrillation but had paused this medication for bladder surgery on 4/29.  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  As she was symptomatic, she was taken to IR for angiogram and revascularization of left carotid artery as well as occluded left M2 segment.  Post IR CT demonstrated small left subarachnoid hemorrhage versus contrast.  Amaurosis fugax due to left ICA occlusion Stroke: Large left MCA  stroke due to left ICA occlusion and MCA nonocclusive clot s/p IR with TICI3 and carotid stenting, likely Afib off Eliquis for procedure CT head No acute abnormality. Small vessel disease.  CTA head & neck occlusion of left ICA just distal to the takeoff, no other hemodynamically significant stenosis IR left ICA occlusion with TICI3 and left M2 nonocclusive clot with TICI2c, as well as left ICA stenting Post IR CT mild left perisylvian hyperattenuation possibly SAH MRI large posterior left MCA infarct, small left cerebellum, left occipital lobe infarcts.  SAH in the left  sylvian fissure and over the posterior left frontal lobe MRA Flow signal is now seen in the cervical, petrous, cavernous, and supraclinoid segments of the left ICA. There is a small filling defect of the petrous segment of the left ICA, which could represent residual thrombus or possibly a dissection. CT head 5/2 left temporal small ICH as well as some inferior SAH, stable.  Left posterior MCA infarct involving, stable. CT Head 5/3- Regression of left hemisphere SAH. Otherwise stable since 02/07/2023 left temporal lobe intra-axial hematoma, left MCA cytotoxic edema. 2D Echo EF 55 to 60% (10/2022) LDL 58 HgbA1c 6.2 UDS negative VTE prophylaxis -SCDs Eliquis (apixaban) daily prior to admission, now on aspirin 81 mg daily and Brilinta (ticagrelor) 90->45 mg bid due to [facility]. May resume Eliquis after SAH/ICH resolves in 7-10 days P2Y12- pending  Therapy recommendations: CIR-awaiting insurance authorization Disposition: Pending  Atrial fibrillation Failed cardioversion Patient has history of atrial fibrillation on Eliquis Eliquis had been paused for bladder procedure Eliquis held at this time due to [facility], may resume after SAH/ICH resolves  Hypertension Home meds: Irbesartan 300 mg daily Stable BP goal less than 160 given SAH Long-term BP goal normotensive  Lipid management Home meds: None LDL 58, goal < 70 No statin needed at this time given LDL at goal and advanced age  Other Stroke Risk Factors Advanced Age >/= 55  Obesity, Body mass index is 37.16 kg/m., BMI >/= 30 associated with increased stroke risk, recommend weight loss, diet and exercise as appropriate   Other Active Problems Bladder cancer with TURBT 02/05/2023-monitor for hematuria Urine is straw colored History of left breast cancer status post mastectomy and chemotherapy in 2008 Remote head and neck radiation in 1940s Status post left thyroidectomy Status post right inferior parathyroidectomy  Hospital day #  7  Patient seen and examined by NP/APP with MD. MD to update note as needed.   Cortney E Ernestina Columbia , MSN, AGACNP-BC [facility] See Amion for schedule and pager information 02/13/2023 11:31 AM I have personally obtained history,examined this patient, reviewed notes, independently viewed imaging studies, participated in medical decision making and plan of care.ROS completed by me personally and pertinent positives fully documented  I have made any additions or clarifications directly to the above note. Agree with note above.  Patient continues to show slight improvement.  Continue ongoing therapies.  Medically stable to be transferred to inpatient rehab when bed available.  Discussed with patient and daughter and husband at the bedside and answered questions.  [name], MD Medical Director [facility] Pager: [phone] 02/13/2023 12:32 PM  To contact Stroke Continuity provider, please refer to [email or web address]. After hours, contact General Neurology

## 2023-02-13 NOTE — H&P (Signed)
Physical Medicine and Rehabilitation Admission H&P        Chief Complaint  Patient presents with   Loss of Vision  : HPI: Loretta Gates is a 87 year old Left-handed female with history of atrial fibrillation on Eliquis with history of failed cardioversion, hypertension, bladder cancer status post TURBT 02/05/2023, diastolic congestive heart failure, chronic venous insufficiency, left breast cancer status postchemotherapy mastectomy, obesity with BMI 37.16, remote head and neck radiation 1940s age 77, left thyroidectomy and right inferior parathyroidectomy with chronic anemia.  Per chart review lives with spouse.  Uses a straight point cane prior to admission.  Presented 02/06/2023 with intermittent transient left eye vision loss.  Cranial CT scan showed acute subarachnoid hemorrhage in the left sylvian fissure and over the left convexity.  MRA/MRI showed acute left MCA territory infarction involving large portions of the left parietal and temporal lobes with additional small acute infarcts also present in the left cerebellum and left occipital lobe as well as redemonstrating subarachnoid hemorrhage and left sylvian fissure as well as left ICA occlusion.  Interventional radiology follow-up for nonocclusive clot with carotid stenting.  Neurology follow-up maintained on aspirin and Brilinta 90 mg twice daily plan to resume Eliquis after SAH/ICH resolved in 7 to 10 days.  She was cleared to begin Lovenox for DVT prophylaxis.  Tolerating a regular diet.  Therapy evaluations completed due to patient decreased functional mobility was admitted for a comprehensive rehab program.     Pt reports can look to R, but sees "better" when looks left.  LBM this AM- denies constipation.  Voiding well.  Denies pain.  RUE feels "awkward", but no pain.  Also said her behavior is "off" and "doesn't feel well".        Review of Systems  Constitutional:  Negative for chills and fever.  HENT:  Negative for  hearing loss.   Eyes:  Positive for blurred vision and double vision.  Respiratory:  Negative for cough, shortness of breath and wheezing.   Cardiovascular:  Positive for palpitations and leg swelling.  Gastrointestinal:  Negative for constipation, heartburn, nausea and vomiting.       GERD  Genitourinary:  Negative for dysuria, flank pain and urgency.  Musculoskeletal:  Positive for joint pain and myalgias.  Skin:  Negative for rash.  Neurological:  Positive for sensory change and focal weakness.  Psychiatric/Behavioral:         Doesn't feel right  All other systems reviewed and are negative.       Past Medical History:  Diagnosis Date   Anemia     Bladder cancer [facility])      urologist--- dr pace   Chronic diastolic (congestive) heart failure (HCC)      followed by dr Jens Som   Chronic venous insufficiency     Edema of both lower extremities      per pt wears compression hose   GERD (gastroesophageal reflux disease)     History of cancer chemotherapy      completed 2008 for left breast cancer   History of head and neck radiation 1946    per pt approx 1946 or 1947  (age 56) due to profound bilateral hearing loss told from scarlett fever/ measles,  once weekly for several weeks had  head/ neck radiation,  hearing was restored without neededing hearing aids   History of left breast cancer 2008    malignant neoplasm of overlapping sites of left breast, ER+;  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 patel;   2006  s/p left thyroidectomy w/ right inferior parathyoidectomy  (per path speciman marked parathyroid but only thyroid tissue)   Hypertension     IDA (iron deficiency anemia)      hematology/ oncologist--- dr ennever/ sarah carter NP;  treated w/ IV iron infusions   Multinodular thyroid     Neuropathy      mild hands/ feet, uses cane    Nocturia more than twice per night     Nocturnal leg cramps     OA (osteoarthritis)      knees   OAB (overactive bladder)     Osteoporosis     PAF (paroxysmal atrial fibrillation) [facility])      cardiologist--- dr Jens Som   PONV (postoperative nausea and vomiting)     Vitamin D deficiency     Wears glasses           Past Surgical History:  Procedure Laterality Date   BREAST LUMPECTOMY WITH RADIOACTIVE SEED LOCALIZATION Right 10/25/2021    Procedure: RIGHT BREAST LUMPECTOMY WITH RADIOACTIVE SEED LOCALIZATION;  Surgeon: Harriette Bouillon, MD;  Location: [facility];  Service: General;  Laterality: Right;   CARDIOVERSION N/A 01/01/2023    Procedure: CARDIOVERSION;  Surgeon: [name], MD;  Location: [facility];  Service: Cardiovascular;  Laterality: N/A;   CHOLECYSTECTOMY, LAPAROSCOPIC   02/25/2017    @[facility]    COLONOSCOPY WITH ESOPHAGOGASTRODUODENOSCOPY (EGD)   2022   IR ANGIO INTRA EXTRACRAN SEL COM CAROTID INNOMINATE UNI R MOD SED   02/07/2023   IR ANGIO VERTEBRAL SEL SUBCLAVIAN INNOMINATE UNI L MOD SED   02/07/2023   IR CT HEAD LTD   02/07/2023   IR PERCUTANEOUS ART THROMBECTOMY/INFUSION INTRACRANIAL INC DIAG ANGIO   02/07/2023   MASTECTOMY WITH AXILLARY LYMPH NODE DISSECTION Left 03/05/2007    @ [facility]   OVARIAN CYST SURGERY        age 61;   abdominal   RADIOLOGY WITH ANESTHESIA N/A 02/06/2023    Procedure: IR WITH ANESTHESIA;  Surgeon: Radiologist, Medication, MD;  Location: [facility] OR;  Service: Radiology;  Laterality: N/A;   THYROIDECTOMY, PARTIAL Left 2006    left lobectomy and right infertior parathyroidectomy   TONSILLECTOMY        child         Family History  Problem Relation Age of Onset   Sudden death Neg Hx      Social History:  reports that she has never smoked. She has never used smokeless tobacco. She reports that she does not drink alcohol and does not use drugs. Allergies:       Allergies  Allergen Reactions   Bee Venom Swelling      Bee stung her on  the inside of her gum. Lips and gums started swelling. No SOB.   Levaquin [Levofloxacin] Hives and Itching   Peanut-Containing Drug Products Other (See Comments)      Stomach pain and diarrhea           Medications Prior to Admission  Medication Sig Dispense Refill   apixaban (ELIQUIS) 5 MG TABS tablet TAKE ONE TABLET BY MOUTH TWICE A DAY 60 tablet 3   cholestyramine (QUESTRAN) 4 GM/DOSE powder Take 1 packet (4 g total) by mouth See admin instructions. Mix 1 teaspoon in water and take at 6pm daily with  warfarin. (Patient taking differently: Take 4 g by mouth See admin instructions. Mix 1 teaspoon in water and take daily) 378 g 3   cinacalcet (SENSIPAR) 30 MG tablet Take 30 mg by mouth daily with breakfast.       denosumab (PROLIA) 60 MG/ML SOSY injection Inject 60 mg into the skin every 6 (six) months.       empagliflozin (JARDIANCE) 10 MG TABS tablet TAKE ONE TABLET BY MOUTH ONE TIME DAILY (Patient taking differently: Take 10 mg by mouth daily.) 30 tablet 3   ergocalciferol (VITAMIN D2) 1.25 MG (50000 UT) capsule Take 50,000 Units by mouth every 14 (fourteen) days. Every other saturday       furosemide (LASIX) 20 MG tablet Alternate taking 20 mg po daily with taking 30 mg po daily ever other day (Patient taking differently: Take 20 mg by mouth as directed. Alternate taking 20 mg po daily with taking 30 mg po daily ever other day) 135 tablet 3   Magnesium Oxide -Mg Supplement 500 MG TABS Take 500 mg by mouth daily.       metoprolol tartrate (LOPRESSOR) 50 MG tablet Take 1.5 tablets (75 mg total) by mouth 2 (two) times daily. 270 tablet 1   omeprazole (PRILOSEC) 40 MG capsule Take 1 capsule (40 mg total) by mouth 2 (two) times daily before lunch and supper. (Patient taking differently: Take 40 mg by mouth in the morning and at bedtime.) 180 capsule 1   oxybutynin (DITROPAN XL) 15 MG 24 hr tablet Take 1 tablet (15 mg total) by mouth at bedtime. (Patient taking differently: Take 15 mg by mouth at  bedtime.) 90 tablet 3   traMADol (ULTRAM) 50 MG tablet Take 1 tablet (50 mg total) by mouth every 6 (six) hours as needed. (Patient not taking: Reported on 02/06/2023) 20 tablet 0          Home: Home Living Family/patient expects to be discharged to:: Private residence Living Arrangements: Spouse/significant other, Children Available Help at Discharge: Family, Available 24 hours/day (dtr works but states can take time off) Type of Home: House Home Access: Stairs to enter Secretary/administrator of Steps: 4 Entrance Stairs-Rails: Right, Left, Can reach both Home Layout: Able to live on main level with bedroom/bathroom, Laundry or work area in basement [employer] Shower/Tub: Engineer, manufacturing systems: Pharmacist, community: Yes Home Equipment: Information systems manager, Medical laboratory scientific officer - quad, Medical laboratory scientific officer - single point, Wheelchair - manual, Agricultural consultant (2 wheels), Crutches, Adaptive equipment, Grab bars - tub/shower [employer] Equipment: Sock aid  Lives With: Spouse   Functional History: Prior Function Prior Level of Function : Independent/Modified Independent Mobility Comments: SPC used during the day. Quad cane is used during the night. ADLs Comments: has sock aid, needs help with fastening bra   Functional Status:  Mobility: Bed Mobility Overal bed mobility: Needs Assistance Bed Mobility: Supine to Sit Supine to sit: Mod assist, +2 for physical assistance, HOB elevated Sit to supine: Total assist General bed mobility comments: received in recliner Transfers Overall transfer level: Needs assistance Equipment used: 2 person hand held assist Transfers: Sit to/from Stand, Bed to chair/wheelchair/BSC Sit to Stand: Mod assist, +2 physical assistance, From elevated surface Bed to/from chair/wheelchair/BSC transfer type:: Step pivot Stand pivot transfers: Total assist, +2 physical assistance Step pivot transfers: Max assist, +2 physical assistance General transfer comment: 2 person assist  provided for sit to stand, needed mod A on R, min A on L. Performed >3 times from recliner and small armchair in room Ambulation/Gait Ambulation/Gait  assistance: Mod assist, +2 physical assistance, +2 safety/equipment Gait Distance (Feet): 6 Feet (2x) Assistive device: 2 person hand held assist Gait Pattern/deviations: Step-to pattern, Decreased stride length, Decreased dorsiflexion - right, Decreased weight shift to right, Knee flexed in stance - right, Narrow base of support General Gait Details: pt with small steps and minimal clearance. max directional cues and assist due to R lateral lean. Gait velocity: decreased Gait velocity interpretation: <1.31 ft/sec, indicative of household ambulator   ADL: ADL Overall ADL's : Needs assistance/impaired Eating/Feeding: Maximal assistance, Bed level Grooming: Oral care, Standing, Moderate assistance Grooming Details (indicate cue type and reason): brushing teeth while standing at sink. 2 person assist to maintain standing balance. Supplies placed in right visual field to encourage visual scanning. Max difficulty and increased time required to scan and locate items. Upper Body Bathing: Total assistance, Bed level Lower Body Bathing: Total assistance, Bed level Upper Body Dressing : Total assistance, Sitting Lower Body Dressing: Total assistance, Bed level Toilet Transfer: Total assistance, +2 for physical assistance, +2 for safety/equipment, Stand-pivot, Cueing for sequencing, Cueing for safety Toilet Transfer Details (indicate cue type and reason): Total A of 2 to transfer Toileting- Clothing Manipulation and Hygiene: Total assistance, +2 for physical assistance, +2 for safety/equipment, Sitting/lateral lean, Sit to/from stand Toileting - Clothing Manipulation Details (indicate cue type and reason): RN assisting with peri-care Functional mobility during ADLs: Total assistance, +2 for physical assistance, +2 for safety/equipment, Cueing for safety,  Cueing for sequencing General ADL Comments: Patient session focus on increasing activity tolerance, overall cognition, and participation in ADL tasks.   Cognition: Cognition Overall Cognitive Status: Impaired/Different from baseline Arousal/Alertness: Lethargic Orientation Level: Oriented to person, Oriented to place, Oriented to time Attention: Sustained Sustained Attention: Impaired Sustained Attention Impairment: Verbal complex, Functional complex Memory: Impaired Memory Impairment: Retrieval deficit Awareness: Impaired Awareness Impairment: Intellectual impairment Problem Solving: Impaired Problem Solving Impairment: Verbal complex Cognition Arousal/Alertness: Awake/alert Behavior During Therapy: WFL for tasks assessed/performed Overall Cognitive Status: Impaired/Different from baseline Area of Impairment: Attention, Memory, Following commands, Safety/judgement, Awareness, Problem solving, Orientation Orientation Level: Disoriented to, Situation Current Attention Level: Focused Memory: Decreased short-term memory Following Commands: Follows one step commands with increased time Safety/Judgement: Decreased awareness of deficits, Decreased awareness of safety Problem Solving: Slow processing, Requires tactile cues, Requires verbal cues, Difficulty sequencing, Decreased initiation General Comments: difficult to assess due to expressive aphasia, frequently says, "that was awkward" in multiple situations   Physical Exam: Blood pressure 132/74, pulse 79, temperature 99.1 F (37.3 C), temperature source Oral, resp. rate 18, height 4\' 11"  (1.499 m), weight 83.5 kg, SpO2 98 %. Physical Exam Vitals and nursing note reviewed. Exam conducted with a chaperone present.  Constitutional:      General: She is not in acute distress.    Appearance: She is obese. She is ill-appearing.     Comments: Pt sitting up in bedside chair; awake, alert, husband holding her hand as they watch TV, NAD   HENT:     Head: Normocephalic and atraumatic.     Comments: Mild R facial droop Tongue midline R face sensation is absent    Right Ear: External ear normal.     Left Ear: External ear normal.     Nose: Nose normal. No congestion.     Mouth/Throat:     Mouth: Mucous membranes are dry.     Pharynx: Oropharynx is clear. No oropharyngeal exudate.  Eyes:     General:        Right eye: No  discharge.        Left eye: No discharge.     Comments: L gaze preference- R visual field appears reduced on exam  Cardiovascular:     Rate and Rhythm: Normal rate. Rhythm irregular.     Heart sounds: Normal heart sounds. No murmur heard.    No gallop.  Pulmonary:     Effort: Pulmonary effort is normal. No respiratory distress.     Breath sounds: Normal breath sounds. No wheezing, rhonchi or rales.  Abdominal:     General: Bowel sounds are normal. There is no distension.     Palpations: Abdomen is soft.     Tenderness: There is no abdominal tenderness.  Musculoskeletal:     Cervical back: Neck supple. No tenderness.     Comments: LUE 5/5 in Biceps, triceps, WE, grip and FA RUE- biceps 3/5; Triceps 3/5; WE 2/5; grip 2/5; and FA 2-/5 LLE- 5/5 in HF, KE, KF, DF and PF RLE- HF 2-/5; KE/KF 3-/5; DF 3-/5 and PF 3/5  Skin:    General: Skin is warm and dry.     Comments: RUE IV's x2- look OK No skin breakdown seen  Neurological:     Mental Status: She is alert.     Comments: Patient is alert.  Noted right-sided neglect.  Provides name and age.  Follows simple commands. Decreased sensation to light touch on RUE/RLE R sided neglect noted L gaze preference  Psychiatric:     Comments: Flat, somewhat interactive        Lab Results Last 48 Hours  No results found for this or any previous visit (from the past 48 hour(s)).   Imaging Results (Last 48 hours)  No results found.         Blood pressure 132/74, pulse 79, temperature 99.1 F (37.3 C), temperature source Oral, resp. rate 18, height 4'  11" (1.499 m), weight 83.5 kg, SpO2 98 %.   Medical Problem List and Plan: 1. 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             -patient may  shower             -ELOS/Goals: 17-24 days min A to supervision 2.  Antithrombotics: -DVT/anticoagulation:  Pharmaceutical: Heparin             -antiplatelet therapy: Aspirin 81 mg daily and Brilinta 90 mg twice daily.  May resume Eliquis after SAH/ICH resolved in 7 to 10 days. 3. Pain Management: Tylenol as needed 4. Mood/Behavior/Sleep: Provide emotional support             -antipsychotic agents: Seroquel 25 mg nightly as needed 5. Neuropsych/cognition: This patient is? capable of making decisions on her own behalf. 6. Skin/Wound Care: Routine skin checks 7. Fluids/Electrolytes/Nutrition: Routine in and outs with follow-up chemistries 8.  Atrial fibrillation with history of failed cardioversion.  Lopressor 75 mg twice daily.  Eliquis may be resumed after SAH/ICH resolved 7 to 10 days 9.  GERD.  Protonix 10.  Diastolic congestive heart failure.  Monitor for any signs of fluid overload- suggest daily weights.  11.  History of left breast cancer with chemotherapy and mastectomy.  Follow-up outpatient 12.  Morbid obesity.  BMI 37.16.  Dietary follow-up 13.  Chronic anemia.  Follow-up CBC       Charlton Amor, PA-C 02/13/2023   I have personally performed a face to face diagnostic evaluation of  this patient and formulated the key components of the plan.  Additionally, I have personally reviewed laboratory data, imaging studies, as well as relevant notes and concur with the physician assistant's documentation above.   The patient's status has not changed from the original H&P.  Any changes in documentation from the acute care chart have been noted above.

## 2023-02-13 NOTE — Progress Notes (Signed)
Report given by phone to [facility]. Will be moving to Rehab unit 4W-Room 23

## 2023-02-13 NOTE — Progress Notes (Signed)
Ranelle Oyster, MD  Physician Physical Medicine and Rehabilitation   Consult Note    Signed   Date of Service: 02/08/2023 10:18 AM  Related encounter: ED to Hosp-Admission (Current) from 02/06/2023 in [place] [facility]W [facility]   Signed     Expand All Collapse All  Show:Clear all [x] Written[x] Templated[] Copied  Added by: [x] Ranelle Oyster, MD  [] Hover for details          Physical Medicine and Rehabilitation Consult Reason for Consult:impaired functional mobility after a stroke Referring Physician: Roda Shutters     HPI: Loretta Gates is a 87 y.o. female with a past medical history of atrial fibrillation on Eliquis (which she had been holding for bladder surgery), hypertension, bladder cancer (with hx of TURBT), left breast cancer who presented on 02/06/2023 with a 2-day history of visual changes in her right eye.  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  She was taken to IR for angiogram and revascularization of the left carotid artery as well as includes a left M2 segment.  Post radiology CT demonstrated small left subarachnoid hemorrhage potentially.  MRI revealed a left MCA infarct involving large portions of the left parietal and temporal lobes and to a lesser degree the left frontal lobe and insula.  There were additional small acute infarcts also present in the left cerebellum and left occipital lobe.  Subarachnoid hemorrhage was redemonstrated in the left sylvian fissure and over the posterior left frontal lobe.  Patient was placed on aspirin 81 mg daily as well as Brilinta 90 mg twice daily given her subarachnoid hemorrhage.  Plan is to resume Eliquis after subarachnoid hemorrhage resolves.  Patient's also had hematuria related to her bladder cancer in prior anticoagulation.  Patient was up with physical therapy yesterday and was min assist for sit to stand transfers +2.  She only took 4 steps his heart rate increased to 156 upon standing.  Rate eventually climbed  to 180 last night which resolved with IV metoprolol.  Pt somnolent this morning     Review of Systems  Unable to perform ROS: Mental acuity        Past Medical History:  Diagnosis Date   Anemia     Bladder cancer [facility])      urologist--- dr pace   Chronic diastolic (congestive) heart failure (HCC)      followed by dr Jens Som   Chronic venous insufficiency     Edema of both lower extremities      per pt wears compression hose   GERD (gastroesophageal reflux disease)     History of cancer chemotherapy      completed 2008 for left breast cancer   History of head and neck radiation 1946    per pt approx 1946 or 1947  (age 67) due to profound bilateral hearing loss told from scarlett fever/ measles,  once weekly for several weeks had  head/ neck radiation,  hearing was restored without neededing hearing aids   History of left breast cancer 2008    malignant neoplasm of overlapping sites of left breast, ER+;  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 patel;   2006  s/p left thyroidectomy w/ right inferior parathyoidectomy  (per path speciman marked parathyroid but only thyroid tissue)   Hypertension     IDA (iron deficiency anemia)  hematology/ oncologist--- dr ennever/ sarah carter NP;  treated w/ IV iron infusions   Multinodular thyroid     Neuropathy      mild hands/ feet, uses cane   Nocturia more than twice per night     Nocturnal leg cramps     OA (osteoarthritis)      knees   OAB (overactive bladder)     Osteoporosis     PAF (paroxysmal atrial fibrillation) [facility])      cardiologist--- dr Jens Som   PONV (postoperative nausea and vomiting)     Vitamin D deficiency     Wears glasses           Past Surgical History:  Procedure Laterality Date   BREAST LUMPECTOMY WITH RADIOACTIVE SEED LOCALIZATION Right 10/25/2021    Procedure: RIGHT  BREAST LUMPECTOMY WITH RADIOACTIVE SEED LOCALIZATION;  Surgeon: Harriette Bouillon, MD;  Location: [facility];  Service: General;  Laterality: Right;   CARDIOVERSION N/A 01/01/2023    Procedure: CARDIOVERSION;  Surgeon: [name], MD;  Location[facility] ENDOSCOPY;  Service: Cardiovascular;  Laterality: N/A;   CHOLECYSTECTOMY, LAPAROSCOPIC   02/25/2017    @[facility]    COLONOSCOPY WITH ESOPHAGOGASTRODUODENOSCOPY (EGD)   2022   MASTECTOMY WITH AXILLARY LYMPH NODE DISSECTION Left 03/05/2007    @ [facility]   OVARIAN CYST SURGERY        age 72;   abdominal   RADIOLOGY WITH ANESTHESIA N/A 02/06/2023    Procedure: IR WITH ANESTHESIA;  Surgeon: Radiologist, Medication, MD;  Location: [facility] OR;  Service: Radiology;  Laterality: N/A;   THYROIDECTOMY, PARTIAL Left 2006    left lobectomy and right infertior parathyroidectomy   TONSILLECTOMY        child         Family History  Problem Relation Age of Onset   Sudden death Neg Hx      Social History:  reports that she has never smoked. She has never used smokeless tobacco. She reports that she does not drink alcohol and does not use drugs. Allergies:       Allergies  Allergen Reactions   Bee Venom Swelling      Bee stung her on the inside of her gum. Lips and gums started swelling. No SOB.   Levaquin [Levofloxacin] Hives and Itching   Peanut-Containing Drug Products Other (See Comments)      Stomach pain and diarrhea           Medications Prior to Admission  Medication Sig Dispense Refill   apixaban (ELIQUIS) 5 MG TABS tablet TAKE ONE TABLET BY MOUTH TWICE A DAY 60 tablet 3   cholestyramine (QUESTRAN) 4 GM/DOSE powder Take 1 packet (4 g total) by mouth See admin instructions. Mix 1 teaspoon in water and take at 6pm daily with warfarin. (Patient taking differently: Take 4 g by mouth See admin instructions. Mix 1 teaspoon in water and take daily) 378 g 3   cinacalcet (SENSIPAR) 30 MG tablet Take 30 mg by mouth daily with breakfast.        denosumab (PROLIA) 60 MG/ML SOSY injection Inject 60 mg into the skin every 6 (six) months.       empagliflozin (JARDIANCE) 10 MG TABS tablet TAKE ONE TABLET BY MOUTH ONE TIME DAILY (Patient taking differently: Take 10 mg by mouth daily.) 30 tablet 3   ergocalciferol (VITAMIN D2) 1.25 MG (50000 UT) capsule Take 50,000 Units by mouth every 14 (fourteen) days. Every other saturday       furosemide (  LASIX) 20 MG tablet Alternate taking 20 mg po daily with taking 30 mg po daily ever other day (Patient taking differently: Take 20 mg by mouth as directed. Alternate taking 20 mg po daily with taking 30 mg po daily ever other day) 135 tablet 3   irbesartan (AVAPRO) 300 MG tablet Take 1 tablet (300 mg total) by mouth daily. (Patient taking differently: Take 300 mg by mouth daily.) 90 tablet 1   Magnesium Oxide -Mg Supplement 500 MG TABS Take 500 mg by mouth daily.       metoprolol tartrate (LOPRESSOR) 50 MG tablet Take 1.5 tablets (75 mg total) by mouth 2 (two) times daily. 270 tablet 1   omeprazole (PRILOSEC) 40 MG capsule Take 1 capsule (40 mg total) by mouth 2 (two) times daily before lunch and supper. (Patient taking differently: Take 40 mg by mouth in the morning and at bedtime.) 180 capsule 1   oxybutynin (DITROPAN XL) 15 MG 24 hr tablet Take 1 tablet (15 mg total) by mouth at bedtime. (Patient taking differently: Take 15 mg by mouth at bedtime.) 90 tablet 3   traMADol (ULTRAM) 50 MG tablet Take 1 tablet (50 mg total) by mouth every 6 (six) hours as needed. (Patient not taking: Reported on 02/06/2023) 20 tablet 0      Home: Home Living Family/patient expects to be discharged to:: Private residence Living Arrangements: Spouse/significant other, Children (adult daughter) Available Help at Discharge: Family, Available 24 hours/day Type of Home: House Home Access: Stairs to enter [employer] of Steps: 4 Entrance Stairs-Rails: Right, Left, Can reach both Home Layout: Able to live on main level  with bedroom/bathroom, Laundry or work area in basement [employer] Shower/Tub: Engineer, manufacturing systems: Standard Home Equipment: Information systems manager, Medical laboratory scientific officer - quad, Medical laboratory scientific officer - single point, Wheelchair - manual, Agricultural consultant (2 wheels), Crutches, Adaptive equipment, Grab bars - tub/shower Adaptive Equipment: Sock aid  Functional History: Prior Function Prior Level of Function : Independent/Modified Independent Mobility Comments: SPC used during the day. Quad cane is used during the night. ADLs Comments: has sock aid, needs help with fastening bra Functional Status:  Mobility: Bed Mobility Overal bed mobility: Needs Assistance Bed Mobility: Supine to Sit, Sit to Supine Supine to sit: Max assist, +2 for physical assistance Sit to supine: Total assist, +2 for physical assistance General bed mobility comments: VC for hand placement and sequencing Transfers Overall transfer level: Needs assistance Equipment used: 2 person hand held assist Transfers: Sit to/from Stand Sit to Stand: Min assist, +2 physical assistance, +2 safety/equipment, From elevated surface General transfer comment: 2 person face to face technique. Ambulation/Gait General Gait Details: upon standing pt's HR increased to 156bpm and noted to be in afib. limited to 4 side steps to [facility], assist to move R LE   ADL: ADL Overall ADL's : Needs assistance/impaired Eating/Feeding: Maximal assistance, Bed level Grooming: Wash/dry hands, Wash/dry face, Maximal assistance, Sitting Grooming Details (indicate cue type and reason): hand over hand assist Upper Body Bathing: Total assistance, Bed level Lower Body Bathing: Total assistance, Bed level Upper Body Dressing : Total assistance, Sitting Lower Body Dressing: Total assistance, Bed level Toilet Transfer Details (indicate cue type and reason): Unable to complete functional transfer during evaluaton due to elevated HR when standing at EOB. Toileting- Clothing Manipulation and Hygiene:  Total assistance, Bed level   Cognition: Cognition Overall Cognitive Status: Impaired/Different from baseline Arousal/Alertness: Lethargic Orientation Level: Oriented to person, Oriented to place, Oriented to time, Disoriented to situation Attention: Sustained Sustained Attention:  Impaired Sustained Attention Impairment: Verbal complex, Functional complex Memory: Impaired Memory Impairment: Retrieval deficit Awareness: Impaired Awareness Impairment: Intellectual impairment Problem Solving: Impaired Problem Solving Impairment: Verbal complex Cognition Arousal/Alertness: Lethargic Behavior During Therapy: Flat affect Overall Cognitive Status: Impaired/Different from baseline Area of Impairment: Attention, Memory, Following commands, Safety/judgement, Awareness, Problem solving Current Attention Level: Focused Memory: Decreased short-term memory Following Commands: Follows one step commands with increased time Safety/Judgement: Decreased awareness of deficits, Decreased awareness of safety Problem Solving: Slow processing, Requires tactile cues, Requires verbal cues General Comments: A&O x4, perseverated on "i feel so strange." educated given on artery occlusion and the effect on the brain, dtr present and appreciative   Blood pressure 107/78, pulse 79, temperature 97.7 F (36.5 C), temperature source Axillary, resp. rate 19, height 4\' 11"  (1.499 m), weight 83.5 kg, SpO2 95 %. Physical Exam Constitutional:      Comments: somnolent  HENT:     Head: Normocephalic.     Nose: Nose normal.  Eyes:     Comments: Ptosis left   Cardiovascular:     Rate and Rhythm: Normal rate. Rhythm irregular.  Pulmonary:     Effort: Pulmonary effort is normal.  Abdominal:     Palpations: Abdomen is soft.  Musculoskeletal:     Cervical back: Normal range of motion.     Comments: Valgus deformity right knee. Both knees with chronic changes, tender with PROM  Neurological:     Comments: Pt arouses  with tactile and verbal stim. Otherwise fairly somnolent. Was non-verbal. Did attempt to open eyes and was able to partially open on left. Right central 7 and tongue deviation. Motor: no activ movement today RUE. RLE grossly 1-2/5. LUE grossly 3-4/5. LLE 2-3/5. Did sense pain in all 4 limbs. DTR's 2+ on right. Perhaps mild resting tone RUE and RLE.   Psychiatric:     Comments: somnolent        Lab Results Last 24 Hours  No results found for this or any previous visit (from the past 24 hour(s)).    Imaging Results (Last 48 hours)  CT HEAD WO CONTRAST ( )   Addendum Date: 02/08/2023   ADDENDUM REPORT: 02/08/2023 01:11 ADDENDUM: The area of parenchymal hemorrhage is new compared to the prior CT but was present on the interval MRI and appears stable from that exam. These findings were discussed by telephone on 02/08/2023 at 1:11 am with provider SRISHTI BHAGAT . Electronically Signed   By: Wiliam Ke M.D.   On: 02/08/2023 01:11    Result Date: 02/08/2023 CLINICAL DATA:  Stroke, follow-up EXAM: CT HEAD WITHOUT CONTRAST TECHNIQUE: Contiguous axial images were obtained from the base of the skull through the vertex without intravenous contrast. RADIATION DOSE REDUCTION: This exam was performed according to the departmental dose-optimization program which includes automated exposure control, adjustment of the mA and/or kV according to patient size and/or use of iterative reconstruction technique. COMPARISON:  02/07/2023 CT head 1:56 a.m., correlation is also made with 02/07/2023 MRI head FINDINGS: Brain: New area of parenchymal hemorrhage in the left temporal lobe, adjacent to the sylvian fissure, which measures approximately 2.0 x 1.3 x 1.5 cm (AP x TR x CC) (series 6, image 46 and series 5, image 38), with a volume of approximately 2 mL. Mild surrounding hypodensity, likely edema. Redemonstrated subarachnoid hemorrhage in the left sylvian fissure and along the left frontotemporal convexity. Hypodensity and  loss of gray-white differentiation in the left posterior MCA territory (series 3, image 19), consistent with of all vein  Ng infarct. No evidence of hemorrhage in this area. No significant mass effect or midline shift. No hydrocephalus. Vascular: No hyperdense vessel. Skull: Negative for fracture or focal lesion. Sinuses/Orbits: No acute finding. Other: The mastoid air cells are well aerated. IMPRESSION: 1. New area of parenchymal hemorrhage in the left temporal lobe, adjacent to the Sylvian fissure, which measures up to 2 cm, with a volume of approximately 2 mL. Heidelberg classification 1c: PH1, hematoma within infarcted tissue, occupying <30%, no substantive mass effect. 2. Redemonstrated subarachnoid hemorrhage in the left Sylvian fissure and along the left frontotemporal convexity. 3. Hypodensity and loss of gray-white differentiation in the left posterior MCA territory, consistent with evolving acute infarct. No evidence of hemorrhage in this area. 4. No significant mass effect or midline shift. These results will be called to the ordering clinician or representative by the Radiologist Assistant, and communication documented in the PACS or [employer]. Electronically Signed: By: Wiliam Ke M.D. On: 02/08/2023 00:11    MR ANGIO HEAD WO CONTRAST   Result Date: 02/07/2023 CLINICAL DATA:  Stroke suspected EXAM: MRI HEAD WITHOUT CONTRAST MRA HEAD WITHOUT CONTRAST TECHNIQUE: Multiplanar, multi-echo pulse sequences of the brain and surrounding structures were acquired without intravenous contrast. Angiographic images of the Circle of Willis were acquired using MRA technique without intravenous contrast. COMPARISON:  CTA head and neck angiogram 02/06/2023 FINDINGS: MRI HEAD FINDINGS Brain: Acute left MCA territory infarct involving large portions of the left parietal and temporal lobes, as well as the left frontal lobe in the opercular regions. Additional small acute infarcts are also present in the left  cerebellum, left occipital lobe. Redemonstrated is subarachnoid hemorrhage in the left sylvian fissure and over the posterior left frontal lobe. Sequela of moderate chronic microvascular ischemic change. No hydrocephalus. Vascular: See below Skull and upper cervical spine: Normal marrow signal. Sinuses/Orbits: Trace bilateral mastoid effusions. No middle ear effusion. Paranasal sinuses are clear. Orbits are unremarkable. Other: None. MRA HEAD FINDINGS Anterior circulation: Flow signal is now seen in the cervical, petrous, cavernous, and supraclinoid segments of the left ICA. Vessels in the left MCA territory appear asymmetrically small in size compared to the right. There is a small filling defect of the petrous segment of the left ICA (series 9, image 52). Posterior circulation: Normal Anatomic variants: IMPRESSION: 1. Acute left MCA territory infarct involving large portions of the left parietal and temporal lobes, and to a lesser degree the left frontal lobe and insula. 2. Additional small acute infarcts are also present in the left cerebellum and the left occipital lobe, which raises the possibility for a central embolic phenomenon. 3. Redemonstrated subarachnoid hemorrhage in the left Sylvian fissure and over the posterior left frontal lobe. 4. Flow signal is now seen in the cervical, petrous, cavernous, and supraclinoid segments of the left ICA. There is a small filling defect of the petrous segment of the left ICA, which could represent residual thrombus or possibly a dissection. Consider CTA head for further evaluation. These results will be called to the ordering clinician or representative by the Radiologist Assistant, and communication documented in the PACS or [employer]. Electronically Signed   By: Lorenza Cambridge M.D.   On: 02/07/2023 15:17    MR BRAIN WO CONTRAST   Result Date: 02/07/2023 CLINICAL DATA:  Stroke suspected EXAM: MRI HEAD WITHOUT CONTRAST MRA HEAD WITHOUT CONTRAST TECHNIQUE:  Multiplanar, multi-echo pulse sequences of the brain and surrounding structures were acquired without intravenous contrast. Angiographic images of the Circle of Willis were acquired  using MRA technique without intravenous contrast. COMPARISON:  CTA head and neck angiogram 02/06/2023 FINDINGS: MRI HEAD FINDINGS Brain: Acute left MCA territory infarct involving large portions of the left parietal and temporal lobes, as well as the left frontal lobe in the opercular regions. Additional small acute infarcts are also present in the left cerebellum, left occipital lobe. Redemonstrated is subarachnoid hemorrhage in the left sylvian fissure and over the posterior left frontal lobe. Sequela of moderate chronic microvascular ischemic change. No hydrocephalus. Vascular: See below Skull and upper cervical spine: Normal marrow signal. Sinuses/Orbits: Trace bilateral mastoid effusions. No middle ear effusion. Paranasal sinuses are clear. Orbits are unremarkable. Other: None. MRA HEAD FINDINGS Anterior circulation: Flow signal is now seen in the cervical, petrous, cavernous, and supraclinoid segments of the left ICA. Vessels in the left MCA territory appear asymmetrically small in size compared to the right. There is a small filling defect of the petrous segment of the left ICA (series 9, image 52). Posterior circulation: Normal Anatomic variants: IMPRESSION: 1. Acute left MCA territory infarct involving large portions of the left parietal and temporal lobes, and to a lesser degree the left frontal lobe and insula. 2. Additional small acute infarcts are also present in the left cerebellum and the left occipital lobe, which raises the possibility for a central embolic phenomenon. 3. Redemonstrated subarachnoid hemorrhage in the left Sylvian fissure and over the posterior left frontal lobe. 4. Flow signal is now seen in the cervical, petrous, cavernous, and supraclinoid segments of the left ICA. There is a small filling defect of  the petrous segment of the left ICA, which could represent residual thrombus or possibly a dissection. Consider CTA head for further evaluation. These results will be called to the ordering clinician or representative by the Radiologist Assistant, and communication documented in the PACS or [employer]. Electronically Signed   By: Lorenza Cambridge M.D.   On: 02/07/2023 15:17    CT HEAD WO CONTRAST ( )   Result Date: 02/07/2023 CLINICAL DATA:  Stroke follow-up EXAM: CT HEAD WITHOUT CONTRAST TECHNIQUE: Contiguous axial images were obtained from the base of the skull through the vertex without intravenous contrast. RADIATION DOSE REDUCTION: This exam was performed according to the departmental dose-optimization program which includes automated exposure control, adjustment of the mA and/or kV according to patient size and/or use of iterative reconstruction technique. COMPARISON:  Angiography 02/06/2023 FINDINGS: Brain: There is acute subarachnoid hemorrhage in the left sylvian fissure and over the left convexity. There is periventricular hypoattenuation compatible with chronic microvascular disease. Vascular: No abnormal hyperdensity of the major intracranial arteries or dural venous sinuses. No intracranial atherosclerosis. Skull: The visualized skull base, calvarium and extracranial soft tissues are normal. Sinuses/Orbits: No fluid levels or advanced mucosal thickening of the visualized paranasal sinuses. No mastoid or middle ear effusion. The orbits are normal. IMPRESSION: Acute subarachnoid hemorrhage in the left Sylvian fissure and over the left convexity. Unchanged compared to the intraprocedural CT obtained during the earlier procedure. Electronically Signed   By: Deatra Robinson M.D.   On: 02/07/2023 02:11    CT ANGIO HEAD NECK W WO CM   Result Date: 02/06/2023 CLINICAL DATA:  Transient monocular vision loss in the left RI EXAM: CT ANGIOGRAPHY HEAD AND NECK WITH AND WITHOUT CONTRAST TECHNIQUE:  Multidetector CT imaging of the head and neck was performed using the standard protocol during bolus administration of intravenous contrast. Multiplanar CT image reconstructions and MIPs were obtained to evaluate the vascular anatomy. Carotid stenosis measurements (when applicable)  are obtained utilizing NASCET criteria, using the distal internal carotid diameter as the denominator. RADIATION DOSE REDUCTION: This exam was performed according to the departmental dose-optimization program which includes automated exposure control, adjustment of the mA and/or kV according to patient size and/or use of iterative reconstruction technique. CONTRAST:  75mL OMNIPAQUE IOHEXOL 350 MG/ML SOLN COMPARISON:  None Available. FINDINGS: CT HEAD FINDINGS Brain: No evidence of acute infarct, hemorrhage, mass, mass effect, or midline shift. No hydrocephalus or extra-axial fluid collection. Periventricular white matter changes, likely the sequela of chronic small vessel ischemic disease. Vascular: No hyperdense vessel. Skull: Negative for fracture or focal lesion. Sinuses/Orbits: No acute finding. Other: The mastoid air cells are well aerated. CTA NECK FINDINGS Aortic arch: Standard branching. Imaged portion shows no evidence of aneurysm or dissection. No significant stenosis of the major arch vessel origins. Mild aortic atherosclerosis. Right carotid system: No evidence of dissection, occlusion, or hemodynamically significant stenosis (greater than 50%). Retropharyngeal course of the mid right ICA Left carotid system: Occlusion of the left ICA just distal to its take-off (series 12, image 117 and series 11, image 116). No other significant stenosis in the opacified portion of the left carotid. No evidence of dissection. Vertebral arteries: No evidence of dissection, occlusion, or hemodynamically significant stenosis (greater than 50%). Skeleton: No acute osseous abnormality. Degenerative changes in the cervical spine. Other neck:  Negative. Upper chest: 3 mm subsolid nodule in the right upper lobe (series 11, image 34). Emphysema. Review of the MIP images confirms the above findings CTA HEAD FINDINGS Anterior circulation: Nonopacification of the left ICA until the distal cavernous segment, where there is minimal opacification, with more robust opacification in the supraclinoid segment, likely retrograde. There is opacification the ophthalmic artery. The right internal carotid artery is patent to the terminus without significant stenosis. A1 segments patent, hypoplastic on the left. Normal anterior communicating artery. Anterior cerebral arteries are patent to their distal aspects without significant stenosis. No M1 stenosis or occlusion. MCA branches perfused to their distal aspects without significant stenosis. Posterior circulation: Vertebral arteries patent to the vertebrobasilar junction without significant stenosis. Posterior inferior cerebellar arteries patent proximally. Basilar patent to its distal aspect without significant stenosis. Superior cerebellar arteries patent proximally. Patent left P1. Fetal origin of the right PCA from the right posterior communicating artery. PCAs perfused to their distal aspects without significant stenosis. The bilateral posterior communicating arteries are patent. Venous sinuses: As permitted by contrast timing, patent. Anatomic variants: Fetal origin of the right PCA. Review of the MIP images confirms the above findings IMPRESSION: 1. 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 No other intracranial large vessel occlusion or significant stenosis. 3. No hemodynamically significant stenosis in the neck. 4. 3 mm subsolid nodule in the right upper lobe. No follow-up needed if patient is  low-risk.This recommendation follows the consensus statement: Guidelines for Management of Incidental Pulmonary Nodules Detected on CT Images: From the Fleischner Society 2017; Radiology 2017; 284:228-243. 5. Aortic atherosclerosis. Aortic Atherosclerosis (ICD10-I70.0). These results were called by telephone at the time of interpretation on 02/06/2023 at 5:23 pm to provider [facility] , who verbally acknowledged these results. Electronically Signed   By: Wiliam Ke M.D.   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 Does the need for close, 24 hr/day medical supervision in concert with the patient's rehab needs make it unreasonable for this patient to be served in a less intensive setting? Yes Co-Morbidities requiring supervision/potential complications:  -Atrial fibrillation with RVR -Bladder cancer with hematuria -Hypertension Due to bladder management, bowel management, safety, skin/wound care, disease management, medication administration, pain management, and patient education, does the patient require 24 hr/day rehab nursing? Yes Does the patient require coordinated care of a physician, rehab nurse, therapy disciplines of PT, OT, SLP to address physical and functional deficits in the context of the above medical diagnosis(es)? Yes Addressing deficits in the following areas: balance, endurance, locomotion, strength, transferring, bowel/bladder control, bathing, dressing, feeding, grooming, toileting, cognition, speech, language, swallowing, and psychosocial support Can the patient actively participate in an intensive therapy program of at least 3 hrs of therapy per day at least 5 days per week? Yes and Potentially The potential for patient to make measurable gains while on inpatient rehab is excellent Anticipated functional outcomes upon discharge from  inpatient rehab are supervision and min assist  with PT, supervision and min assist with OT, supervision with SLP. Estimated rehab length of stay to reach the above functional goals is: 17-24 days Anticipated discharge destination: Home Overall Rehab/Functional Prognosis: excellent   POST ACUTE RECOMMENDATIONS: This patient's condition is appropriate for continued rehabilitative care in the following setting: CIR Patient has agreed to participate in recommended program. N/A Note that insurance prior authorization may be required for reimbursement for recommended care.   Comment: Pt was very active PTA. Has supportive daughter and husband. Rehab Admissions Coordinator to follow up for ongoing functional and medical progress.      MEDICAL RECOMMENDATIONS: Emerging tone RUE and RLE: recommend PRAFO, WHO to maintain proper positioning. I have ordered.  Discussed with husband and daughter ways to stimulate her and to engage her. Reviewed some basic ROM concepts as well.      I have personally performed a face to face diagnostic evaluation of this patient. Additionally, I have examined the patient's medical record including any pertinent labs and radiographic images. If the physician assistant has documented in this note, I have reviewed and edited or otherwise concur with the physician assistant's documentation.   Thanks,   Ranelle Oyster, MD 02/08/2023          Routing History

## 2023-02-13 NOTE — Discharge Summary (Addendum)
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 Lips and gums started swelling. No SOB.   Levaquin [levofloxacin] Hives, Itching   Peanut-containing Drug Products Other (See Comments)   Stomach pain and diarrhea         Medication List     STOP taking these medications    Eliquis 5 MG Tabs tablet Generic drug: apixaban   furosemide 20 MG tablet Commonly known as: LASIX   irbesartan 300 MG tablet Commonly known as: AVAPRO   traMADol 50 MG tablet Commonly known as: Ultram       TAKE these medications    aspirin 81 MG chewable tablet Chew 1 tablet (81 mg total) by mouth daily. Start taking on: Feb 14, 2023   cholestyramine 4 GM/DOSE powder Commonly known as: QUESTRAN Take 1 packet (4 g total) by mouth See admin instructions. Mix 1 teaspoon in water and take at 6pm daily with warfarin. What changed: additional instructions   cinacalcet 30 MG tablet Commonly known as: SENSIPAR Take 30 mg by mouth daily with breakfast.   denosumab 60 MG/ML Sosy injection Commonly known as: PROLIA Inject 60 mg into the skin every 6 (six) months.   ergocalciferol 1.25 MG (50000 UT) capsule Commonly known as: VITAMIN D2 Take 50,000 Units by mouth every 14 (fourteen) days.  Every other saturday   Jardiance 10 MG Tabs tablet Generic drug: empagliflozin TAKE ONE TABLET BY MOUTH ONE TIME DAILY   Magnesium Oxide -Mg Supplement 500 MG Tabs Take 500 mg by mouth daily.   Metoprolol Tartrate 75 MG Tabs Take 1 tablet (75 mg total) by mouth 2 (two) times daily. What changed: medication strength   omeprazole 40 MG capsule Commonly known as: PRILOSEC Take 1 capsule (40 mg total) by mouth 2 (two) times daily before lunch and supper. What changed: when to take this   oxybutynin 15 MG 24 hr tablet Commonly known as: DITROPAN XL Take 1 tablet (15 mg total) by mouth at bedtime.   QUEtiapine 25 MG tablet Commonly known as: SEROQUEL Take 1 tablet (25 mg total) by mouth at bedtime as needed (give 2200 medications prior to seroquel).   senna-docusate 8.6-50 MG tablet Commonly known as: Senokot-S Take 1 tablet by mouth at bedtime as needed for moderate constipation or mild constipation.   ticagrelor 90 MG Tabs tablet Commonly known as: Brilinta Take 0.5 tablets (45 mg total) by mouth 2 (two) times daily.        LABORATORY STUDIES CBC    Component Value Date/Time   WBC 10.1 02/10/2023 0813   RBC 4.84 02/10/2023 0813   HGB 13.4 02/10/2023 0813   HGB 15.0 12/22/2022 1308   HCT 41.6 02/10/2023 0813  HCT 46.6 12/22/2022 1308   PLT 238 02/10/2023 0813   PLT 292 12/22/2022 1308   MCV 86.0 02/10/2023 0813   MCV 89 12/22/2022 1308   MCH 27.7 02/10/2023 0813   MCHC 32.2 02/10/2023 0813   RDW 15.7 (H) 02/10/2023 0813   RDW 13.8 12/22/2022 1308   LYMPHSABS 0.5 (L) 02/07/2023 0128   MONOABS 0.2 02/07/2023 0128   EOSABS 0.0 02/07/2023 0128   BASOSABS 0.0 02/07/2023 0128   CMP    Component Value Date/Time   NA 137 02/10/2023 0813   NA 141 12/22/2022 1308   K 3.8 02/10/2023 0813   CL 106 02/10/2023 0813   CO2 19 (L) 02/10/2023 0813   GLUCOSE 108 (H) 02/10/2023 0813   BUN 13 02/10/2023 0813   BUN 19 12/22/2022 1308   CREATININE 0.59 02/10/2023 0813    CREATININE 0.87 12/01/2022 1625   CALCIUM 9.3 02/10/2023 0813   PROT 6.3 (L) 02/06/2023 1532   ALBUMIN 3.5 02/06/2023 1532   AST 20 02/06/2023 1532   AST 20 03/10/2022 1011   ALT 19 02/06/2023 1532   ALT 26 03/10/2022 1011   ALKPHOS 43 02/06/2023 1532   BILITOT 0.8 02/06/2023 1532   BILITOT 0.5 03/10/2022 1011   GFRNONAA >60 02/10/2023 0813   GFRNONAA >60 03/10/2022 1011   COAGS Lab Results  Component Value Date   INR 1.7 (H) 10/22/2022   INR 2.4 (H) 10/22/2022   INR 3.0 (H) 10/21/2022   Lipid Panel    Component Value Date/Time   CHOL 122 02/07/2023 0128   TRIG 92 02/07/2023 0128   HDL 46 02/07/2023 0128   CHOLHDL 2.7 02/07/2023 0128   VLDL 18 02/07/2023 0128   LDLCALC 58 02/07/2023 0128   HgbA1C  Lab Results  Component Value Date   HGBA1C 6.2 (H) 02/07/2023   Urinalysis    Component Value Date/Time   COLORURINE YELLOW 02/07/2023 0132   APPEARANCEUR CLEAR 02/07/2023 0132   LABSPEC >1.046 (H) 02/07/2023 0132   PHURINE 6.0 02/07/2023 0132   GLUCOSEU >=500 (A) 02/07/2023 0132   HGBUR SMALL (A) 02/07/2023 0132   BILIRUBINUR NEGATIVE 02/07/2023 0132   BILIRUBINUR Negative 07/17/2022 1100   KETONESUR 5 (A) 02/07/2023 0132   PROTEINUR NEGATIVE 02/07/2023 0132   UROBILINOGEN 0.2 07/17/2022 1100   NITRITE NEGATIVE 02/07/2023 0132   LEUKOCYTESUR NEGATIVE 02/07/2023 0132   Urine Drug Screen     Component Value Date/Time   LABOPIA NONE DETECTED 02/07/2023 0132   COCAINSCRNUR NONE DETECTED 02/07/2023 0132   LABBENZ NONE DETECTED 02/07/2023 0132   AMPHETMU NONE DETECTED 02/07/2023 0132   THCU NONE DETECTED 02/07/2023 0132   LABBARB NONE DETECTED 02/07/2023 0132    Alcohol Level No results found for: "ETH"   SIGNIFICANT DIAGNOSTIC STUDIES CT HEAD WO CONTRAST ( )  Result Date: 02/09/2023 CLINICAL DATA:  87 year old female status post code stroke presentation on 02/06/2023 with left ICA ELVO. Treated with endovascular reperfusion. Left cerebral infarcts,  predominantly the MCA territory. EXAM: CT HEAD WITHOUT CONTRAST TECHNIQUE: Contiguous axial images were obtained from the base of the skull through the vertex without intravenous contrast. RADIATION DOSE REDUCTION: This exam was performed according to the departmental dose-optimization program which includes automated exposure control, adjustment of the mA and/or kV according to patient size and/or use of iterative reconstruction technique. COMPARISON:  Post treatment MRI and MRA 02/07/2023. Head CT 02/07/2023 and earlier. FINDINGS: Brain: Oval hyperdense intra-axial hemorrhage in the left temporal lobe underlying the middle and superior temporal gyri (coronal image 31).  Hyperdense blood products are 24 x 18 x 21 mm (AP by transverse by CC), stable since 02/07/2023 estimated intra-axial volume 4-5 mL (Heidelberg classification 1c: PH1, hematoma within infarcted tissue, occupying <30%, no substantive mass effect). Small volume subarachnoid hemorrhage (Heidelberg classification 3c: Subarachnoid hemorrhage). In the left sylvian fissure has regressed. No superimposed IVH. No ventriculomegaly. Confluent cytotoxic edema in the posterior left MCA territory with only mild mass effect on the adjacent ventricle. Stable gray-white differentiation elsewhere. Basilar cisterns are patent. No new intracranial hemorrhage. No new cytotoxic edema Vascular: Calcified atherosclerosis at the skull base. Skull: No acute osseous abnormality identified. Sinuses/Orbits: Visualized paranasal sinuses and mastoids are stable and well aerated. Other: No acute orbit or scalp soft tissue finding. IMPRESSION: 1. Regression of left hemisphere subarachnoid hemorrhage. Otherwise stable since 02/07/2023 left temporal lobe intra-axial hematoma, left MCA cytotoxic edema. 2. No significant intracranial mass effect. No new intracranial abnormality. Electronically Signed   By: Odessa Fleming M.D.   On: 02/09/2023 06:14   IR PERCUTANEOUS ART  THROMBECTOMY/INFUSION INTRACRANIAL INC DIAG ANGIO  Result Date: 02/08/2023 INDICATION: Crescendo left eye amaurosis fugax lasting minutes, increasing in frequency. 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 EXAM: 1. EMERGENT LARGE VESSEL OCCLUSION THROMBOLYSIS (anterior CIRCULATION) COMPARISON:  CT angiogram of the head and neck February 06, 2023. MEDICATIONS: Ancef 2 g IV antibiotic was administered within 1 hour of the procedure. ANESTHESIA/SEDATION: General anesthesia. CONTRAST:  Omnipaque 300 approximately 220 cc FLUOROSCOPY TIME:  Fluoroscopy Time: 53 minutes 30 seconds (7 6 mGy). COMPLICATIONS: None immediate. TECHNIQUE: Following a full explanation of the procedure along with the potential associated complications, an informed witnessed consent was obtained. The risks of intracranial hemorrhage of 10%, worsening neurological deficit, ventilator dependency, death and inability to revascularize were all reviewed in detail with the patient. The patient was then put under general anesthesia by the Department of Anesthesiology at [facility]. The right groin was prepped and draped in the usual sterile fashion. Thereafter using modified Seldinger technique, transfemoral access into the right common femoral artery was obtained without difficulty. Over a 0.035 inch guidewire a 125 cm 5.5 French Simmons 2 support catheter inside of an 087 95 cm balloon guide catheter was advanced to the aortic arch region, and selectively positioned in the left common carotid artery. FINDINGS: The left common carotid arteriogram demonstrates the left external carotid artery and its major branches to be widely patent. The left internal carotid artery at the bulb has a stent with no evidence of a string sign on the delayed arterial phase. No reconstitution is seen of the left internal carotid artery in the cavernous segment from the external carotid  artery branches. PROCEDURE: Through the balloon guide catheter in the left internal carotid artery bulb region, a 160 cm microcatheter was advanced over an 014 inch standard Aristotle micro guidewire with a moderate J configuration distal to the bulb. Using a torque device, the micro guidewire was gently manipulated with mild resistance through the occluded left internal carotid artery followed by the microcatheter without resistance to the proximal cavernous segment. The guidewire was removed. Good aspiration obtained from the hub of the microcatheter. A control arteriogram performed through this demonstrated copious amounts of filling defects extending from the paraophthalmic region to the carotid bulb. Also no evidence of antegrade flow was noted in the supraclinoid left ICA or the left middle cerebral artery. The micro guidewire was then gently manipulated  with a torque device and advanced into the left MCA M1 M2 region followed by the microcatheter. The guidewire was removed. Good aspiration obtained from the hub of the microcatheter which was then connected to continuous heparinized saline infusion. At this time a 4 mm x 40 mm Solitaire X retrieval device was advanced to the distal end of the microcatheter and positioned such the proximal portion of the retriever was at the left internal carotid artery terminus. At this time, a [place] sterile 4 mm x 30 mm angioplasty balloon catheter which had been prepped with 50% contrast and 50% heparinized saline infusion was advanced with the retrieval pusher wire and positioned with its distal and proximal markers adequate distant from the site of the occlusion. Control angioplasty was then performed using micro inflation syringe device via micro tubing with the balloon being inflated twice each time for approximately a minute to 4 mm. The balloon was then deflated and retrieved proximally. A control arteriogram performed through the balloon guide catheter now  demonstrated revascularization of the previously occluded proximal left internal carotid artery with minimal flow noted distally. The balloon guide catheter was advanced through the proximal left internal carotid artery into its middle third. This while aspiration was performed at the hub of the balloon guide catheter. The retriever was then gently retrieved into the balloon guide catheter as aspiration was being conducted and removed. The balloon guide catheter was retrieved just proximal to the internal carotid artery bulb. Copious amounts of clot were noted in the aspirate from the balloon guide catheter hub, and also the retriever and the Tuohy Borst. A control arteriogram performed through the balloon guide catheter now demonstrated brisk flow through the previously occluded proximal left internal carotid artery distally to the petrous, cavernous and supraclinoid ICAs. The revascularized left middle cerebral artery demonstrated a TICI 3 revascularization. Flash filling was noted in the hypoplastic stenotic proximal left A1. Attempts were made with the Embotrap filter device to be advanced to the distal cervical left ICA. Tortuosity in the mid cervical left ICA provided significant resistance to the distal advancement of the filter device catheter. This was then retrieved and removed. Over an 014 inch 300 cm exchange micro guidewire, a 6/8 mm x 40 mm Xact stent delivery apparatus which had been retrogradely purged with heparinized saline infusion was advanced using the rapid exchange technique and positioned without difficulty across the stenosis and proximal left ICA. The during the deployment of the stent, aspiration was applied at the hub of the balloon guide catheter. Following deployment, a control arteriogram performed through the balloon guide catheter demonstrated excellent apposition and coverage of the diseased proximal left ICA. A control arteriogram performed through the stented left internal carotid  artery demonstrated wide patency of the left internal carotid artery extra cranially and intracranially. However, now evident was nonocclusive filling defect within the distal M2 segment of the inferior division 055 132 Zoom aspiration catheter with an 021 160 cm microcatheter was advanced over an 018 inch Aristotle micro guidewire with a J configuration to the left middle cerebral artery inferior division. The micro guidewire was then gently advanced through the occluded distal M2 segment followed by the microcatheter. The guidewire was removed. Good aspiration obtained from the hub of the microcatheter. Following confirmation on safe positioning of the tip of the microcatheter, a 4 mm x 40 mm Solitaire X retrieval device was then deployed in the usual manner covering the previous area of subtotal occlusion of the distal left MCA inferior division  M2 segment. The Zoom aspiration catheter was advanced to the distal inferior division. Aspiration was then performed at the hub of the Zoom aspiration catheter for approximately 2 minutes, and also through the balloon guide catheter in the left common carotid artery. The combination of the retrieval device, and the Zoom aspiration catheter was then gently retrieved with the retriever being captured into the Zoom aspiration catheter as the combination was withdrawn through the stented segment. A control arteriogram performed through the balloon guide catheter following flow reversal now demonstrated significantly improved flow through the previously occluded distal M2 segment with a TICI 2C revascularization. The inferior and posterior angular branch continued to demonstrate slow flow in its proximal 1/3. At this time, a Socrates 155 cm aspiration catheter was advanced over an 018 inch Aristotle micro guidewire with a moderate J configuration to the distal left M2 inferior division segment. The guidewire was then advanced without difficulty through the occluded segment  followed by the Socrates aspiration catheter. The Socrates catheter was advanced distally as the wire was withdrawn. Aspiration was then applied at the hub of the Socrates guide catheter for approximately 2 minute. Following this, the aspiration catheter was removed. Arteriogram performed through the balloon guide catheter now demonstrated the revascularized previously noted inferior division continued to be a small non occlusive filling defects in the more distal segment. This remained stable over the next 10 minutes. A final control arteriogram performed through the balloon guide catheter in the left common carotid artery now demonstrated excellent flow through the stented left ICA proximally and through the left middle cerebral artery territory with a TICI 2C revascularization. The balloon guide catheter was removed. A 5 [place] JB 1 diagnostic catheter was then advanced over an 035 inch guidewire and positioned in the right common carotid artery and the left vertebral artery. The right common carotid arteriogram demonstrates a stenosis of 50% at the origin of the right external carotid artery. The right internal carotid artery at the bulb to the cranial skull base is widely patent. The petrous, the cavernous and the supraclinoid right ICA demonstrate wide patency. The right middle and the right anterior cerebral artery opacify into the capillary and venous phases. A right posterior communicating artery is seen opacifying the right posterior cerebral artery distribution. Cross-filling via the anterior communicating artery of the left anterior cerebral A2 segment and distally is noted. The left vertebral artery origin is widely patent. Moderate tortuosity is noted in the proximal left vertebral artery. More distally, the vessel is seen to opacify to the cranial skull base. The left vertebrobasilar junction and the left posterior-inferior cerebellar artery demonstrate opacification. The basilar artery, the superior  cerebellar arteries and the left posterior cerebral arteries opacify into the capillary and venous phases. Non visualization of the right posterior cerebral arteries due to inflow from the more dominant anterior circulation as described above. An 8 French Angio-Seal closure device was deployed for hemostasis for hemostasis at the right groin puncture site. Distal pulses remained present bilaterally unchanged from prior to the procedure. A flat panel CT of the brain demonstrated mild subarachnoid hyperattenuation in the left perisylvian and left anterior parietal convexity. No mass effect or shift was noted. The patient's general anesthesia was reversed, and patient was extubated. Upon recovery, the patient was noted to move her left side spontaneously. Patient was able to move her right leg spontaneously, and barely able to lift her right arm. Pupils were 3 mm round regular reactive. Tongue was in the midline. The patient  remained unresponsive to verbal instructions. She was then transferred to the neuro ICU for post revascularization care. The patient received 81 mg of a aspirin, and 90 mg of Brilinta in route from referring hospital. She received quite a bolus dose of cangrelor, and a quarter does infusion which was stopped after about 2 hours. 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 Status post revascularization of occluded left internal carotid artery intracranially and extra cranially with 1 pass with a a 4 mm x 40 mm Solitaire X retrieval device contact aspiration achieving a TICI 3 revascularization of the left middle cerebral artery distribution. Status post revascularization of occluded distal M2 segment of the inferior division with 1 pass with a 4 mm x 40 mm Solitaire X retrieval device, and contact aspiration, and 1 pass with contact with an Socrates  aspiration catheter achieving a TICI 2C revascularization. PLAN: Follow-up in the clinic 2-4 weeks post discharge. Electronically Signed   By: Julieanne Cotton M.D.   On: 02/08/2023 10:37   IR ANGIO INTRA EXTRACRAN SEL COM CAROTID INNOMINATE UNI R MOD SED  Result Date: 02/08/2023 INDICATION: Crescendo left eye amaurosis fugax lasting minutes, increasing in frequency. 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 EXAM: 1. EMERGENT LARGE VESSEL OCCLUSION THROMBOLYSIS (anterior CIRCULATION) COMPARISON:  CT angiogram of the head and neck February 06, 2023. MEDICATIONS: Ancef 2 g IV antibiotic was administered within 1 hour of the procedure. ANESTHESIA/SEDATION: General anesthesia. CONTRAST:  Omnipaque 300 approximately 220 cc FLUOROSCOPY TIME:  Fluoroscopy Time: 53 minutes 30 seconds (7 6 mGy). COMPLICATIONS: None immediate. TECHNIQUE: Following a full explanation of the procedure along with the potential associated complications, an informed witnessed consent was obtained. The risks of intracranial hemorrhage of 10%, worsening neurological deficit, ventilator dependency, death and inability to revascularize were all reviewed in detail with the patient. The patient was then put under general anesthesia by the Department of Anesthesiology at [facility]. The right groin was prepped and draped in the usual sterile fashion. Thereafter using modified Seldinger technique, transfemoral access into the right common femoral artery was obtained without difficulty. Over a 0.035 inch guidewire a 125 cm 5.5 French Simmons 2 support catheter inside of an 087 95 cm balloon guide catheter was advanced to the aortic arch region, and selectively positioned in the left common carotid artery. FINDINGS: The left common carotid arteriogram demonstrates the left external carotid artery and its major branches to be widely patent. The left internal carotid  artery at the bulb has a stent with no evidence of a string sign on the delayed arterial phase. No reconstitution is seen of the left internal carotid artery in the cavernous segment from the external carotid artery branches. PROCEDURE: Through the balloon guide catheter in the left internal carotid artery bulb region, a 160 cm microcatheter was advanced over an 014 inch standard Aristotle micro guidewire with a moderate J configuration distal to the bulb. Using a torque device, the micro guidewire was gently manipulated with mild resistance through the occluded left internal carotid artery followed by the microcatheter without resistance to the proximal cavernous segment. The guidewire was removed. Good aspiration obtained from the hub of the microcatheter. A control arteriogram performed through this demonstrated copious amounts of filling defects extending from the paraophthalmic region to the carotid bulb. Also no evidence of antegrade flow  was noted in the supraclinoid left ICA or the left middle cerebral artery. The micro guidewire was then gently manipulated with a torque device and advanced into the left MCA M1 M2 region followed by the microcatheter. The guidewire was removed. Good aspiration obtained from the hub of the microcatheter which was then connected to continuous heparinized saline infusion. At this time a 4 mm x 40 mm Solitaire X retrieval device was advanced to the distal end of the microcatheter and positioned such the proximal portion of the retriever was at the left internal carotid artery terminus. At this time, a [place] sterile 4 mm x 30 mm angioplasty balloon catheter which had been prepped with 50% contrast and 50% heparinized saline infusion was advanced with the retrieval pusher wire and positioned with its distal and proximal markers adequate distant from the site of the occlusion. Control angioplasty was then performed using micro inflation syringe device via micro tubing with the  balloon being inflated twice each time for approximately a minute to 4 mm. The balloon was then deflated and retrieved proximally. A control arteriogram performed through the balloon guide catheter now demonstrated revascularization of the previously occluded proximal left internal carotid artery with minimal flow noted distally. The balloon guide catheter was advanced through the proximal left internal carotid artery into its middle third. This while aspiration was performed at the hub of the balloon guide catheter. The retriever was then gently retrieved into the balloon guide catheter as aspiration was being conducted and removed. The balloon guide catheter was retrieved just proximal to the internal carotid artery bulb. Copious amounts of clot were noted in the aspirate from the balloon guide catheter hub, and also the retriever and the Tuohy Borst. A control arteriogram performed through the balloon guide catheter now demonstrated brisk flow through the previously occluded proximal left internal carotid artery distally to the petrous, cavernous and supraclinoid ICAs. The revascularized left middle cerebral artery demonstrated a TICI 3 revascularization. Flash filling was noted in the hypoplastic stenotic proximal left A1. Attempts were made with the Embotrap filter device to be advanced to the distal cervical left ICA. Tortuosity in the mid cervical left ICA provided significant resistance to the distal advancement of the filter device catheter. This was then retrieved and removed. Over an 014 inch 300 cm exchange micro guidewire, a 6/8 mm x 40 mm Xact stent delivery apparatus which had been retrogradely purged with heparinized saline infusion was advanced using the rapid exchange technique and positioned without difficulty across the stenosis and proximal left ICA. The during the deployment of the stent, aspiration was applied at the hub of the balloon guide catheter. Following deployment, a control  arteriogram performed through the balloon guide catheter demonstrated excellent apposition and coverage of the diseased proximal left ICA. A control arteriogram performed through the stented left internal carotid artery demonstrated wide patency of the left internal carotid artery extra cranially and intracranially. However, now evident was nonocclusive filling defect within the distal M2 segment of the inferior division 055 132 Zoom aspiration catheter with an 021 160 cm microcatheter was advanced over an 018 inch Aristotle micro guidewire with a J configuration to the left middle cerebral artery inferior division. The micro guidewire was then gently advanced through the occluded distal M2 segment followed by the microcatheter. The guidewire was removed. Good aspiration obtained from the hub of the microcatheter. Following confirmation on safe positioning of the tip of the microcatheter, a 4 mm x 40 mm Solitaire X retrieval device was  then deployed in the usual manner covering the previous area of subtotal occlusion of the distal left MCA inferior division M2 segment. The Zoom aspiration catheter was advanced to the distal inferior division. Aspiration was then performed at the hub of the Zoom aspiration catheter for approximately 2 minutes, and also through the balloon guide catheter in the left common carotid artery. The combination of the retrieval device, and the Zoom aspiration catheter was then gently retrieved with the retriever being captured into the Zoom aspiration catheter as the combination was withdrawn through the stented segment. A control arteriogram performed through the balloon guide catheter following flow reversal now demonstrated significantly improved flow through the previously occluded distal M2 segment with a TICI 2C revascularization. The inferior and posterior angular branch continued to demonstrate slow flow in its proximal 1/3. At this time, a Socrates 155 cm aspiration catheter was  advanced over an 018 inch Aristotle micro guidewire with a moderate J configuration to the distal left M2 inferior division segment. The guidewire was then advanced without difficulty through the occluded segment followed by the Socrates aspiration catheter. The Socrates catheter was advanced distally as the wire was withdrawn. Aspiration was then applied at the hub of the Socrates guide catheter for approximately 2 minute. Following this, the aspiration catheter was removed. Arteriogram performed through the balloon guide catheter now demonstrated the revascularized previously noted inferior division continued to be a small non occlusive filling defects in the more distal segment. This remained stable over the next 10 minutes. A final control arteriogram performed through the balloon guide catheter in the left common carotid artery now demonstrated excellent flow through the stented left ICA proximally and through the left middle cerebral artery territory with a TICI 2C revascularization. The balloon guide catheter was removed. A 5 [place] JB 1 diagnostic catheter was then advanced over an 035 inch guidewire and positioned in the right common carotid artery and the left vertebral artery. The right common carotid arteriogram demonstrates a stenosis of 50% at the origin of the right external carotid artery. The right internal carotid artery at the bulb to the cranial skull base is widely patent. The petrous, the cavernous and the supraclinoid right ICA demonstrate wide patency. The right middle and the right anterior cerebral artery opacify into the capillary and venous phases. A right posterior communicating artery is seen opacifying the right posterior cerebral artery distribution. Cross-filling via the anterior communicating artery of the left anterior cerebral A2 segment and distally is noted. The left vertebral artery origin is widely patent. Moderate tortuosity is noted in the proximal left vertebral artery.  More distally, the vessel is seen to opacify to the cranial skull base. The left vertebrobasilar junction and the left posterior-inferior cerebellar artery demonstrate opacification. The basilar artery, the superior cerebellar arteries and the left posterior cerebral arteries opacify into the capillary and venous phases. Non visualization of the right posterior cerebral arteries due to inflow from the more dominant anterior circulation as described above. An 8 French Angio-Seal closure device was deployed for hemostasis for hemostasis at the right groin puncture site. Distal pulses remained present bilaterally unchanged from prior to the procedure. A flat panel CT of the brain demonstrated mild subarachnoid hyperattenuation in the left perisylvian and left anterior parietal convexity. No mass effect or shift was noted. The patient's general anesthesia was reversed, and patient was extubated. Upon recovery, the patient was noted to move her left side spontaneously. Patient was able to move her right leg spontaneously, and barely  able to lift her right arm. Pupils were 3 mm round regular reactive. Tongue was in the midline. The patient remained unresponsive to verbal instructions. She was then transferred to the neuro ICU for post revascularization care. The patient received 81 mg of a aspirin, and 90 mg of Brilinta in route from referring hospital. She received quite a bolus dose of cangrelor, and a quarter does infusion which was stopped after about 2 hours. 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 Status post revascularization of occluded left internal carotid artery intracranially and extra cranially with 1 pass with a a 4 mm x 40 mm Solitaire X retrieval device contact aspiration achieving a TICI 3 revascularization of the left middle cerebral artery distribution.  Status post revascularization of occluded distal M2 segment of the inferior division with 1 pass with a 4 mm x 40 mm Solitaire X retrieval device, and contact aspiration, and 1 pass with contact with an Socrates aspiration catheter achieving a TICI 2C revascularization. PLAN: Follow-up in the clinic 2-4 weeks post discharge. Electronically Signed   By: Julieanne Cotton M.D.   On: 02/08/2023 10:37   IR ANGIO VERTEBRAL SEL SUBCLAVIAN INNOMINATE UNI L MOD SED  Result Date: 02/08/2023 INDICATION: Crescendo left eye amaurosis fugax lasting minutes, increasing in frequency. 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 EXAM: 1. EMERGENT LARGE VESSEL OCCLUSION THROMBOLYSIS (anterior CIRCULATION) COMPARISON:  CT angiogram of the head and neck February 06, 2023. MEDICATIONS: Ancef 2 g IV antibiotic was administered within 1 hour of the procedure. ANESTHESIA/SEDATION: General anesthesia. CONTRAST:  Omnipaque 300 approximately 220 cc FLUOROSCOPY TIME:  Fluoroscopy Time: 53 minutes 30 seconds (7 6 mGy). COMPLICATIONS: None immediate. TECHNIQUE: Following a full explanation of the procedure along with the potential associated complications, an informed witnessed consent was obtained. The risks of intracranial hemorrhage of 10%, worsening neurological deficit, ventilator dependency, death and inability to revascularize were all reviewed in detail with the patient. The patient was then put under general anesthesia by the Department of Anesthesiology at [facility]. The right groin was prepped and draped in the usual sterile fashion. Thereafter using modified Seldinger technique, transfemoral access into the right common femoral artery was obtained without difficulty. Over a 0.035 inch guidewire a 125 cm 5.5 French Simmons 2 support catheter inside of an 087 95 cm balloon guide catheter was advanced to the aortic arch region, and selectively  positioned in the left common carotid artery. FINDINGS: The left common carotid arteriogram demonstrates the left external carotid artery and its major branches to be widely patent. The left internal carotid artery at the bulb has a stent with no evidence of a string sign on the delayed arterial phase. No reconstitution is seen of the left internal carotid artery in the cavernous segment from the external carotid artery branches. PROCEDURE: Through the balloon guide catheter in the left internal carotid artery bulb region, a 160 cm microcatheter was advanced over an 014 inch standard Aristotle micro guidewire with a moderate J configuration distal to the bulb. Using a torque device, the micro guidewire was gently manipulated with mild resistance through the occluded left internal carotid artery followed by the microcatheter without resistance to the proximal cavernous segment. The guidewire was removed. Good aspiration obtained from the hub of the microcatheter. A control arteriogram performed through this demonstrated copious amounts  of filling defects extending from the paraophthalmic region to the carotid bulb. Also no evidence of antegrade flow was noted in the supraclinoid left ICA or the left middle cerebral artery. The micro guidewire was then gently manipulated with a torque device and advanced into the left MCA M1 M2 region followed by the microcatheter. The guidewire was removed. Good aspiration obtained from the hub of the microcatheter which was then connected to continuous heparinized saline infusion. At this time a 4 mm x 40 mm Solitaire X retrieval device was advanced to the distal end of the microcatheter and positioned such the proximal portion of the retriever was at the left internal carotid artery terminus. At this time, a [place] sterile 4 mm x 30 mm angioplasty balloon catheter which had been prepped with 50% contrast and 50% heparinized saline infusion was advanced with the retrieval pusher  wire and positioned with its distal and proximal markers adequate distant from the site of the occlusion. Control angioplasty was then performed using micro inflation syringe device via micro tubing with the balloon being inflated twice each time for approximately a minute to 4 mm. The balloon was then deflated and retrieved proximally. A control arteriogram performed through the balloon guide catheter now demonstrated revascularization of the previously occluded proximal left internal carotid artery with minimal flow noted distally. The balloon guide catheter was advanced through the proximal left internal carotid artery into its middle third. This while aspiration was performed at the hub of the balloon guide catheter. The retriever was then gently retrieved into the balloon guide catheter as aspiration was being conducted and removed. The balloon guide catheter was retrieved just proximal to the internal carotid artery bulb. Copious amounts of clot were noted in the aspirate from the balloon guide catheter hub, and also the retriever and the Tuohy Borst. A control arteriogram performed through the balloon guide catheter now demonstrated brisk flow through the previously occluded proximal left internal carotid artery distally to the petrous, cavernous and supraclinoid ICAs. The revascularized left middle cerebral artery demonstrated a TICI 3 revascularization. Flash filling was noted in the hypoplastic stenotic proximal left A1. Attempts were made with the Embotrap filter device to be advanced to the distal cervical left ICA. Tortuosity in the mid cervical left ICA provided significant resistance to the distal advancement of the filter device catheter. This was then retrieved and removed. Over an 014 inch 300 cm exchange micro guidewire, a 6/8 mm x 40 mm Xact stent delivery apparatus which had been retrogradely purged with heparinized saline infusion was advanced using the rapid exchange technique and positioned  without difficulty across the stenosis and proximal left ICA. The during the deployment of the stent, aspiration was applied at the hub of the balloon guide catheter. Following deployment, a control arteriogram performed through the balloon guide catheter demonstrated excellent apposition and coverage of the diseased proximal left ICA. A control arteriogram performed through the stented left internal carotid artery demonstrated wide patency of the left internal carotid artery extra cranially and intracranially. However, now evident was nonocclusive filling defect within the distal M2 segment of the inferior division 055 132 Zoom aspiration catheter with an 021 160 cm microcatheter was advanced over an 018 inch Aristotle micro guidewire with a J configuration to the left middle cerebral artery inferior division. The micro guidewire was then gently advanced through the occluded distal M2 segment followed by the microcatheter. The guidewire was removed. Good aspiration obtained from the hub of the microcatheter. Following confirmation on safe  positioning of the tip of the microcatheter, a 4 mm x 40 mm Solitaire X retrieval device was then deployed in the usual manner covering the previous area of subtotal occlusion of the distal left MCA inferior division M2 segment. The Zoom aspiration catheter was advanced to the distal inferior division. Aspiration was then performed at the hub of the Zoom aspiration catheter for approximately 2 minutes, and also through the balloon guide catheter in the left common carotid artery. The combination of the retrieval device, and the Zoom aspiration catheter was then gently retrieved with the retriever being captured into the Zoom aspiration catheter as the combination was withdrawn through the stented segment. A control arteriogram performed through the balloon guide catheter following flow reversal now demonstrated significantly improved flow through the previously occluded distal M2  segment with a TICI 2C revascularization. The inferior and posterior angular branch continued to demonstrate slow flow in its proximal 1/3. At this time, a Socrates 155 cm aspiration catheter was advanced over an 018 inch Aristotle micro guidewire with a moderate J configuration to the distal left M2 inferior division segment. The guidewire was then advanced without difficulty through the occluded segment followed by the Socrates aspiration catheter. The Socrates catheter was advanced distally as the wire was withdrawn. Aspiration was then applied at the hub of the Socrates guide catheter for approximately 2 minute. Following this, the aspiration catheter was removed. Arteriogram performed through the balloon guide catheter now demonstrated the revascularized previously noted inferior division continued to be a small non occlusive filling defects in the more distal segment. This remained stable over the next 10 minutes. A final control arteriogram performed through the balloon guide catheter in the left common carotid artery now demonstrated excellent flow through the stented left ICA proximally and through the left middle cerebral artery territory with a TICI 2C revascularization. The balloon guide catheter was removed. A 5 [place] JB 1 diagnostic catheter was then advanced over an 035 inch guidewire and positioned in the right common carotid artery and the left vertebral artery. The right common carotid arteriogram demonstrates a stenosis of 50% at the origin of the right external carotid artery. The right internal carotid artery at the bulb to the cranial skull base is widely patent. The petrous, the cavernous and the supraclinoid right ICA demonstrate wide patency. The right middle and the right anterior cerebral artery opacify into the capillary and venous phases. A right posterior communicating artery is seen opacifying the right posterior cerebral artery distribution. Cross-filling via the anterior  communicating artery of the left anterior cerebral A2 segment and distally is noted. The left vertebral artery origin is widely patent. Moderate tortuosity is noted in the proximal left vertebral artery. More distally, the vessel is seen to opacify to the cranial skull base. The left vertebrobasilar junction and the left posterior-inferior cerebellar artery demonstrate opacification. The basilar artery, the superior cerebellar arteries and the left posterior cerebral arteries opacify into the capillary and venous phases. Non visualization of the right posterior cerebral arteries due to inflow from the more dominant anterior circulation as described above. An 8 French Angio-Seal closure device was deployed for hemostasis for hemostasis at the right groin puncture site. Distal pulses remained present bilaterally unchanged from prior to the procedure. A flat panel CT of the brain demonstrated mild subarachnoid hyperattenuation in the left perisylvian and left anterior parietal convexity. No mass effect or shift was noted. The patient's general anesthesia was reversed, and patient was extubated. Upon recovery, the patient was  noted to move her left side spontaneously. Patient was able to move her right leg spontaneously, and barely able to lift her right arm. Pupils were 3 mm round regular reactive. Tongue was in the midline. The patient remained unresponsive to verbal instructions. She was then transferred to the neuro ICU for post revascularization care. The patient received 81 mg of a aspirin, and 90 mg of Brilinta in route from referring hospital. She received quite a bolus dose of cangrelor, and a quarter does infusion which was stopped after about 2 hours. 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 Status post revascularization of occluded left internal carotid artery  intracranially and extra cranially with 1 pass with a a 4 mm x 40 mm Solitaire X retrieval device contact aspiration achieving a TICI 3 revascularization of the left middle cerebral artery distribution. Status post revascularization of occluded distal M2 segment of the inferior division with 1 pass with a 4 mm x 40 mm Solitaire X retrieval device, and contact aspiration, and 1 pass with contact with an Socrates aspiration catheter achieving a TICI 2C revascularization. PLAN: Follow-up in the clinic 2-4 weeks post discharge. Electronically Signed   By: Julieanne Cotton M.D.   On: 02/08/2023 10:37   IR CT Head Ltd  Result Date: 02/08/2023 INDICATION: Crescendo left eye amaurosis fugax lasting minutes, increasing in frequency. 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 EXAM: 1. EMERGENT LARGE VESSEL OCCLUSION THROMBOLYSIS (anterior CIRCULATION) COMPARISON:  CT angiogram of the head and neck February 06, 2023. MEDICATIONS: Ancef 2 g IV antibiotic was administered within 1 hour of the procedure. ANESTHESIA/SEDATION: General anesthesia. CONTRAST:  Omnipaque 300 approximately 220 cc FLUOROSCOPY TIME:  Fluoroscopy Time: 53 minutes 30 seconds (7 6 mGy). COMPLICATIONS: None immediate. TECHNIQUE: Following a full explanation of the procedure along with the potential associated complications, an informed witnessed consent was obtained. The risks of intracranial hemorrhage of 10%, worsening neurological deficit, ventilator dependency, death and inability to revascularize were all reviewed in detail with the patient. The patient was then put under general anesthesia by the Department of Anesthesiology at [facility]. The right groin was prepped and draped in the usual sterile fashion. Thereafter using modified Seldinger technique, transfemoral access into the right common femoral artery was obtained without difficulty. Over a 0.035 inch  guidewire a 125 cm 5.5 French Simmons 2 support catheter inside of an 087 95 cm balloon guide catheter was advanced to the aortic arch region, and selectively positioned in the left common carotid artery. FINDINGS: The left common carotid arteriogram demonstrates the left external carotid artery and its major branches to be widely patent. The left internal carotid artery at the bulb has a stent with no evidence of a string sign on the delayed arterial phase. No reconstitution is seen of the left internal carotid artery in the cavernous segment from the external carotid artery branches. PROCEDURE: Through the balloon guide catheter in the left internal carotid artery bulb region, a 160 cm microcatheter was advanced over an 014 inch standard Aristotle micro guidewire with a moderate J configuration distal to the bulb. Using a torque device, the micro guidewire was gently manipulated with mild resistance through the occluded left internal carotid artery followed by the microcatheter without resistance to the proximal cavernous segment. The guidewire was removed. Good aspiration obtained from the hub  of the microcatheter. A control arteriogram performed through this demonstrated copious amounts of filling defects extending from the paraophthalmic region to the carotid bulb. Also no evidence of antegrade flow was noted in the supraclinoid left ICA or the left middle cerebral artery. The micro guidewire was then gently manipulated with a torque device and advanced into the left MCA M1 M2 region followed by the microcatheter. The guidewire was removed. Good aspiration obtained from the hub of the microcatheter which was then connected to continuous heparinized saline infusion. At this time a 4 mm x 40 mm Solitaire X retrieval device was advanced to the distal end of the microcatheter and positioned such the proximal portion of the retriever was at the left internal carotid artery terminus. At this time, a [place] sterile 4  mm x 30 mm angioplasty balloon catheter which had been prepped with 50% contrast and 50% heparinized saline infusion was advanced with the retrieval pusher wire and positioned with its distal and proximal markers adequate distant from the site of the occlusion. Control angioplasty was then performed using micro inflation syringe device via micro tubing with the balloon being inflated twice each time for approximately a minute to 4 mm. The balloon was then deflated and retrieved proximally. A control arteriogram performed through the balloon guide catheter now demonstrated revascularization of the previously occluded proximal left internal carotid artery with minimal flow noted distally. The balloon guide catheter was advanced through the proximal left internal carotid artery into its middle third. This while aspiration was performed at the hub of the balloon guide catheter. The retriever was then gently retrieved into the balloon guide catheter as aspiration was being conducted and removed. The balloon guide catheter was retrieved just proximal to the internal carotid artery bulb. Copious amounts of clot were noted in the aspirate from the balloon guide catheter hub, and also the retriever and the Tuohy Borst. A control arteriogram performed through the balloon guide catheter now demonstrated brisk flow through the previously occluded proximal left internal carotid artery distally to the petrous, cavernous and supraclinoid ICAs. The revascularized left middle cerebral artery demonstrated a TICI 3 revascularization. Flash filling was noted in the hypoplastic stenotic proximal left A1. Attempts were made with the Embotrap filter device to be advanced to the distal cervical left ICA. Tortuosity in the mid cervical left ICA provided significant resistance to the distal advancement of the filter device catheter. This was then retrieved and removed. Over an 014 inch 300 cm exchange micro guidewire, a 6/8 mm x 40 mm Xact  stent delivery apparatus which had been retrogradely purged with heparinized saline infusion was advanced using the rapid exchange technique and positioned without difficulty across the stenosis and proximal left ICA. The during the deployment of the stent, aspiration was applied at the hub of the balloon guide catheter. Following deployment, a control arteriogram performed through the balloon guide catheter demonstrated excellent apposition and coverage of the diseased proximal left ICA. A control arteriogram performed through the stented left internal carotid artery demonstrated wide patency of the left internal carotid artery extra cranially and intracranially. However, now evident was nonocclusive filling defect within the distal M2 segment of the inferior division 055 132 Zoom aspiration catheter with an 021 160 cm microcatheter was advanced over an 018 inch Aristotle micro guidewire with a J configuration to the left middle cerebral artery inferior division. The micro guidewire was then gently advanced through the occluded distal M2 segment followed by the microcatheter. The guidewire was removed. Good  aspiration obtained from the hub of the microcatheter. Following confirmation on safe positioning of the tip of the microcatheter, a 4 mm x 40 mm Solitaire X retrieval device was then deployed in the usual manner covering the previous area of subtotal occlusion of the distal left MCA inferior division M2 segment. The Zoom aspiration catheter was advanced to the distal inferior division. Aspiration was then performed at the hub of the Zoom aspiration catheter for approximately 2 minutes, and also through the balloon guide catheter in the left common carotid artery. The combination of the retrieval device, and the Zoom aspiration catheter was then gently retrieved with the retriever being captured into the Zoom aspiration catheter as the combination was withdrawn through the stented segment. A control arteriogram  performed through the balloon guide catheter following flow reversal now demonstrated significantly improved flow through the previously occluded distal M2 segment with a TICI 2C revascularization. The inferior and posterior angular branch continued to demonstrate slow flow in its proximal 1/3. At this time, a Socrates 155 cm aspiration catheter was advanced over an 018 inch Aristotle micro guidewire with a moderate J configuration to the distal left M2 inferior division segment. The guidewire was then advanced without difficulty through the occluded segment followed by the Socrates aspiration catheter. The Socrates catheter was advanced distally as the wire was withdrawn. Aspiration was then applied at the hub of the Socrates guide catheter for approximately 2 minute. Following this, the aspiration catheter was removed. Arteriogram performed through the balloon guide catheter now demonstrated the revascularized previously noted inferior division continued to be a small non occlusive filling defects in the more distal segment. This remained stable over the next 10 minutes. A final control arteriogram performed through the balloon guide catheter in the left common carotid artery now demonstrated excellent flow through the stented left ICA proximally and through the left middle cerebral artery territory with a TICI 2C revascularization. The balloon guide catheter was removed. A 5 [place] JB 1 diagnostic catheter was then advanced over an 035 inch guidewire and positioned in the right common carotid artery and the left vertebral artery. The right common carotid arteriogram demonstrates a stenosis of 50% at the origin of the right external carotid artery. The right internal carotid artery at the bulb to the cranial skull base is widely patent. The petrous, the cavernous and the supraclinoid right ICA demonstrate wide patency. The right middle and the right anterior cerebral artery opacify into the capillary and venous  phases. A right posterior communicating artery is seen opacifying the right posterior cerebral artery distribution. Cross-filling via the anterior communicating artery of the left anterior cerebral A2 segment and distally is noted. The left vertebral artery origin is widely patent. Moderate tortuosity is noted in the proximal left vertebral artery. More distally, the vessel is seen to opacify to the cranial skull base. The left vertebrobasilar junction and the left posterior-inferior cerebellar artery demonstrate opacification. The basilar artery, the superior cerebellar arteries and the left posterior cerebral arteries opacify into the capillary and venous phases. Non visualization of the right posterior cerebral arteries due to inflow from the more dominant anterior circulation as described above. An 8 French Angio-Seal closure device was deployed for hemostasis for hemostasis at the right groin puncture site. Distal pulses remained present bilaterally unchanged from prior to the procedure. A flat panel CT of the brain demonstrated mild subarachnoid hyperattenuation in the left perisylvian and left anterior parietal convexity. No mass effect or shift was noted. The patient's general  anesthesia was reversed, and patient was extubated. Upon recovery, the patient was noted to move her left side spontaneously. Patient was able to move her right leg spontaneously, and barely able to lift her right arm. Pupils were 3 mm round regular reactive. Tongue was in the midline. The patient remained unresponsive to verbal instructions. She was then transferred to the neuro ICU for post revascularization care. The patient received 81 mg of a aspirin, and 90 mg of Brilinta in route from referring hospital. She received quite a bolus dose of cangrelor, and a quarter does infusion which was stopped after about 2 hours. 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 Status post revascularization of occluded left internal carotid artery intracranially and extra cranially with 1 pass with a a 4 mm x 40 mm Solitaire X retrieval device contact aspiration achieving a TICI 3 revascularization of the left middle cerebral artery distribution. Status post revascularization of occluded distal M2 segment of the inferior division with 1 pass with a 4 mm x 40 mm Solitaire X retrieval device, and contact aspiration, and 1 pass with contact with an Socrates aspiration catheter achieving a TICI 2C revascularization. PLAN: Follow-up in the clinic 2-4 weeks post discharge. Electronically Signed   By: Julieanne Cotton M.D.   On: 02/08/2023 10:37   CT HEAD WO CONTRAST ( )  Addendum Date: 02/08/2023   ADDENDUM REPORT: 02/08/2023 01:11 ADDENDUM: The area of parenchymal hemorrhage is new compared to the prior CT but was present on the interval MRI and appears stable from that exam. These findings were discussed by telephone on 02/08/2023 at 1:11 am with provider SRISHTI BHAGAT . Electronically Signed   By: Wiliam Ke M.D.   On: 02/08/2023 01:11   Result Date: 02/08/2023 CLINICAL DATA:  Stroke, follow-up EXAM: CT HEAD WITHOUT CONTRAST TECHNIQUE: Contiguous axial images were obtained from the base of the skull through the vertex without intravenous contrast. RADIATION DOSE REDUCTION: This exam was performed according to the departmental dose-optimization program which includes automated exposure control, adjustment of the mA and/or kV according to patient size and/or use of iterative reconstruction technique. COMPARISON:  02/07/2023 CT head 1:56 a.m., correlation is also made with 02/07/2023 MRI head FINDINGS: Brain: New area of parenchymal hemorrhage in the left temporal lobe, adjacent to the sylvian fissure, which measures approximately 2.0 x 1.3 x 1.5 cm (AP x TR x CC) (series 6, image 46 and series 5, image  38), with a volume of approximately 2 mL. Mild surrounding hypodensity, likely edema. Redemonstrated subarachnoid hemorrhage in the left sylvian fissure and along the left frontotemporal convexity. Hypodensity and loss of gray-white differentiation in the left posterior MCA territory (series 3, image 19), consistent with of all vein Ng infarct. No evidence of hemorrhage in this area. No significant mass effect or midline shift. No hydrocephalus. Vascular: No hyperdense vessel. Skull: Negative for fracture or focal lesion. Sinuses/Orbits: No acute finding. Other: The mastoid air cells are well aerated. IMPRESSION: 1. New area of parenchymal hemorrhage in the left temporal lobe, adjacent to the Sylvian fissure, which measures up to 2 cm, with a volume of approximately 2 mL. Heidelberg classification 1c: PH1, hematoma within infarcted tissue, occupying <30%, no substantive mass effect. 2. Redemonstrated subarachnoid hemorrhage in the left Sylvian fissure and along the left frontotemporal convexity. 3. Hypodensity and loss of gray-white differentiation in the left posterior MCA territory, consistent with evolving acute infarct. No evidence  of hemorrhage in this area. 4. No significant mass effect or midline shift. These results will be called to the ordering clinician or representative by the Radiologist Assistant, and communication documented in the PACS or [employer]. Electronically Signed: By: Wiliam Ke M.D. On: 02/08/2023 00:11   MR ANGIO HEAD WO CONTRAST  Result Date: 02/07/2023 CLINICAL DATA:  Stroke suspected EXAM: MRI HEAD WITHOUT CONTRAST MRA HEAD WITHOUT CONTRAST TECHNIQUE: Multiplanar, multi-echo pulse sequences of the brain and surrounding structures were acquired without intravenous contrast. Angiographic images of the Circle of Willis were acquired using MRA technique without intravenous contrast. COMPARISON:  CTA head and neck angiogram 02/06/2023 FINDINGS: MRI HEAD FINDINGS Brain: Acute left  MCA territory infarct involving large portions of the left parietal and temporal lobes, as well as the left frontal lobe in the opercular regions. Additional small acute infarcts are also present in the left cerebellum, left occipital lobe. Redemonstrated is subarachnoid hemorrhage in the left sylvian fissure and over the posterior left frontal lobe. Sequela of moderate chronic microvascular ischemic change. No hydrocephalus. Vascular: See below Skull and upper cervical spine: Normal marrow signal. Sinuses/Orbits: Trace bilateral mastoid effusions. No middle ear effusion. Paranasal sinuses are clear. Orbits are unremarkable. Other: None. MRA HEAD FINDINGS Anterior circulation: Flow signal is now seen in the cervical, petrous, cavernous, and supraclinoid segments of the left ICA. Vessels in the left MCA territory appear asymmetrically small in size compared to the right. There is a small filling defect of the petrous segment of the left ICA (series 9, image 52). Posterior circulation: Normal Anatomic variants: IMPRESSION: 1. Acute left MCA territory infarct involving large portions of the left parietal and temporal lobes, and to a lesser degree the left frontal lobe and insula. 2. Additional small acute infarcts are also present in the left cerebellum and the left occipital lobe, which raises the possibility for a central embolic phenomenon. 3. Redemonstrated subarachnoid hemorrhage in the left Sylvian fissure and over the posterior left frontal lobe. 4. Flow signal is now seen in the cervical, petrous, cavernous, and supraclinoid segments of the left ICA. There is a small filling defect of the petrous segment of the left ICA, which could represent residual thrombus or possibly a dissection. Consider CTA head for further evaluation. These results will be called to the ordering clinician or representative by the Radiologist Assistant, and communication documented in the PACS or [employer]. Electronically Signed    By: Lorenza Cambridge M.D.   On: 02/07/2023 15:17   MR BRAIN WO CONTRAST  Result Date: 02/07/2023 CLINICAL DATA:  Stroke suspected EXAM: MRI HEAD WITHOUT CONTRAST MRA HEAD WITHOUT CONTRAST TECHNIQUE: Multiplanar, multi-echo pulse sequences of the brain and surrounding structures were acquired without intravenous contrast. Angiographic images of the Circle of Willis were acquired using MRA technique without intravenous contrast. COMPARISON:  CTA head and neck angiogram 02/06/2023 FINDINGS: MRI HEAD FINDINGS Brain: Acute left MCA territory infarct involving large portions of the left parietal and temporal lobes, as well as the left frontal lobe in the opercular regions. Additional small acute infarcts are also present in the left cerebellum, left occipital lobe. Redemonstrated is subarachnoid hemorrhage in the left sylvian fissure and over the posterior left frontal lobe. Sequela of moderate chronic microvascular ischemic change. No hydrocephalus. Vascular: See below Skull and upper cervical spine: Normal marrow signal. Sinuses/Orbits: Trace bilateral mastoid effusions. No middle ear effusion. Paranasal sinuses are clear. Orbits are unremarkable. Other: None. MRA HEAD FINDINGS Anterior circulation: Flow signal is now seen in  the cervical, petrous, cavernous, and supraclinoid segments of the left ICA. Vessels in the left MCA territory appear asymmetrically small in size compared to the right. There is a small filling defect of the petrous segment of the left ICA (series 9, image 52). Posterior circulation: Normal Anatomic variants: IMPRESSION: 1. Acute left MCA territory infarct involving large portions of the left parietal and temporal lobes, and to a lesser degree the left frontal lobe and insula. 2. Additional small acute infarcts are also present in the left cerebellum and the left occipital lobe, which raises the possibility for a central embolic phenomenon. 3. Redemonstrated subarachnoid hemorrhage in the left  Sylvian fissure and over the posterior left frontal lobe. 4. Flow signal is now seen in the cervical, petrous, cavernous, and supraclinoid segments of the left ICA. There is a small filling defect of the petrous segment of the left ICA, which could represent residual thrombus or possibly a dissection. Consider CTA head for further evaluation. These results will be called to the ordering clinician or representative by the Radiologist Assistant, and communication documented in the PACS or [employer]. Electronically Signed   By: Lorenza Cambridge M.D.   On: 02/07/2023 15:17   CT HEAD WO CONTRAST ( )  Result Date: 02/07/2023 CLINICAL DATA:  Stroke follow-up EXAM: CT HEAD WITHOUT CONTRAST TECHNIQUE: Contiguous axial images were obtained from the base of the skull through the vertex without intravenous contrast. RADIATION DOSE REDUCTION: This exam was performed according to the departmental dose-optimization program which includes automated exposure control, adjustment of the mA and/or kV according to patient size and/or use of iterative reconstruction technique. COMPARISON:  Angiography 02/06/2023 FINDINGS: Brain: There is acute subarachnoid hemorrhage in the left sylvian fissure and over the left convexity. There is periventricular hypoattenuation compatible with chronic microvascular disease. Vascular: No abnormal hyperdensity of the major intracranial arteries or dural venous sinuses. No intracranial atherosclerosis. Skull: The visualized skull base, calvarium and extracranial soft tissues are normal. Sinuses/Orbits: No fluid levels or advanced mucosal thickening of the visualized paranasal sinuses. No mastoid or middle ear effusion. The orbits are normal. IMPRESSION: Acute subarachnoid hemorrhage in the left Sylvian fissure and over the left convexity. Unchanged compared to the intraprocedural CT obtained during the earlier procedure. Electronically Signed   By: Deatra Robinson M.D.   On: 02/07/2023 02:11   CT  ANGIO HEAD NECK W WO CM  Result Date: 02/06/2023 CLINICAL DATA:  Transient monocular vision loss in the left RI EXAM: CT ANGIOGRAPHY HEAD AND NECK WITH AND WITHOUT CONTRAST TECHNIQUE: Multidetector CT imaging of the head and neck was performed using the standard protocol during bolus administration of intravenous contrast. Multiplanar CT image reconstructions and MIPs were obtained to evaluate the vascular anatomy. Carotid stenosis measurements (when applicable) are obtained utilizing NASCET criteria, using the distal internal carotid diameter as the denominator. RADIATION DOSE REDUCTION: This exam was performed according to the departmental dose-optimization program which includes automated exposure control, adjustment of the mA and/or kV according to patient size and/or use of iterative reconstruction technique. CONTRAST:  75mL OMNIPAQUE IOHEXOL 350 MG/ML SOLN COMPARISON:  None Available. FINDINGS: CT HEAD FINDINGS Brain: No evidence of acute infarct, hemorrhage, mass, mass effect, or midline shift. No hydrocephalus or extra-axial fluid collection. Periventricular white matter changes, likely the sequela of chronic small vessel ischemic disease. Vascular: No hyperdense vessel. Skull: Negative for fracture or focal lesion. Sinuses/Orbits: No acute finding. Other: The mastoid air cells are well aerated. CTA NECK FINDINGS Aortic arch: Standard branching. Imaged portion  shows no evidence of aneurysm or dissection. No significant stenosis of the major arch vessel origins. Mild aortic atherosclerosis. Right carotid system: No evidence of dissection, occlusion, or hemodynamically significant stenosis (greater than 50%). Retropharyngeal course of the mid right ICA Left carotid system: Occlusion of the left ICA just distal to its take-off (series 12, image 117 and series 11, image 116). No other significant stenosis in the opacified portion of the left carotid. No evidence of dissection. Vertebral arteries: No evidence  of dissection, occlusion, or hemodynamically significant stenosis (greater than 50%). Skeleton: No acute osseous abnormality. Degenerative changes in the cervical spine. Other neck: Negative. Upper chest: 3 mm subsolid nodule in the right upper lobe (series 11, image 34). Emphysema. Review of the MIP images confirms the above findings CTA HEAD FINDINGS Anterior circulation: Nonopacification of the left ICA until the distal cavernous segment, where there is minimal opacification, with more robust opacification in the supraclinoid segment, likely retrograde. There is opacification the ophthalmic artery. The right internal carotid artery is patent to the terminus without significant stenosis. A1 segments patent, hypoplastic on the left. Normal anterior communicating artery. Anterior cerebral arteries are patent to their distal aspects without significant stenosis. No M1 stenosis or occlusion. MCA branches perfused to their distal aspects without significant stenosis. Posterior circulation: Vertebral arteries patent to the vertebrobasilar junction without significant stenosis. Posterior inferior cerebellar arteries patent proximally. Basilar patent to its distal aspect without significant stenosis. Superior cerebellar arteries patent proximally. Patent left P1. Fetal origin of the right PCA from the right posterior communicating artery. PCAs perfused to their distal aspects without significant stenosis. The bilateral posterior communicating arteries are patent. Venous sinuses: As permitted by contrast timing, patent. Anatomic variants: Fetal origin of the right PCA. Review of the MIP images confirms the above findings IMPRESSION: 1. 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 No other intracranial large vessel  occlusion or significant stenosis. 3. No hemodynamically significant stenosis in the neck. 4. 3 mm subsolid nodule in the right upper lobe. No follow-up needed if patient is low-risk.This recommendation follows the consensus statement: Guidelines for Management of Incidental Pulmonary Nodules Detected on CT Images: From the Fleischner Society 2017; Radiology 2017; 284:228-243. 5. Aortic atherosclerosis. Aortic Atherosclerosis (ICD10-I70.0). These results were called by telephone at the time of interpretation on 02/06/2023 at 5:23 pm to provider [facility] , who verbally acknowledged these results. Electronically Signed   By: Wiliam Ke M.D.   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 Post IR CT demonstrated small left subarachnoid hemorrhage.  MRI demonstrated large posterior left MCA infarct, small infarcts in the left cerebellum and left occipital lobe as well as subarachnoid hemorrhage in left sylvian fissure.  Later his head CT showed small left temporal ICH.  Patient will need to resume Eliquis 7 to 10  days after her stroke, and head CT will be necessary before this to determine resolution of IPH and subarachnoid hemorrhage.  Amaurosis fugax due to left ICA occlusion Stroke: Large left MCA stroke due to left ICA occlusion and MCA nonocclusive clot s/p IR with TICI3 and carotid stenting, likely Afib off Eliquis for procedure CT head No acute  abnormality. Small vessel disease.  CTA head & neck occlusion of left ICA just distal to the takeoff, no other hemodynamically significant stenosis IR left ICA occlusion with TICI3 and left M2 nonocclusive clot with TICI2c, as well as left ICA stenting Post IR CT mild left perisylvian hyperattenuation possibly SAH MRI large posterior left MCA infarct, small left cerebellum, left occipital lobe infarcts.  SAH in the left sylvian fissure and over the posterior left frontal lobe MRA Flow signal is now seen in the cervical, petrous, cavernous, and supraclinoid segments of the left ICA. There is a small filling defect of the petrous segment of the left ICA, which could represent residual thrombus or possibly a dissection. CT head 5/2 left temporal small ICH as well as some inferior SAH, stable.  Left posterior MCA infarct involving, stable. CT Head 5/3- Regression of left hemisphere SAH. Otherwise stable since 02/07/2023 left temporal lobe intra-axial hematoma, left MCA cytotoxic edema. 2D Echo EF 55 to 60% (10/2022) LDL 58 HgbA1c 6.2 UDS negative VTE prophylaxis -SCDs Eliquis (apixaban) daily prior to admission, now on aspirin 81 mg daily and Brilinta (ticagrelor) 90->45 mg bid due to [facility]. May resume Eliquis after SAH/ICH resolves in 7-10 days, will need head CT prior to resuming anticoagulation [id number]- pending  Therapy recommendations: CIR   Atrial fibrillation Failed cardioversion Patient has history of atrial fibrillation on Eliquis Eliquis had been paused for bladder procedure Eliquis held at this time due to [facility], may resume after SAH/ICH resolves   Hypertension Home meds: Irbesartan 300 mg daily Stable BP goal less than 160 given SAH Long-term BP goal normotensive   Lipid management Home meds: None LDL 58, goal < 70 No statin needed at this time given LDL at goal and advanced age   Other Stroke Risk Factors Advanced Age >/= 81  Obesity, Body mass index is 37.16 kg/m., BMI >/= 30  associated with increased stroke risk, recommend weight loss, diet and exercise as appropriate    Other Active Problems Bladder cancer with TURBT 02/05/2023-monitor for hematuria Urine is straw colored History of left breast cancer status post mastectomy and chemotherapy in 2008 Remote head and neck radiation in 1940s Status post left thyroidectomy Status post right inferior parathyroidectomy  DISCHARGE EXAM Blood pressure 132/74, pulse 79, temperature 99.1 F (37.3 C), temperature source Oral, resp. rate 18, height 4\' 11"  (1.499 m), weight 83.5 kg, SpO2 98 %.   PHYSICAL EXAM General: Alert, well-nourished, well-developed elderly Caucasian lady in no acute distress Respiratory: Regular, unlabored respirations on room air   NEURO:  Mental Status: AA&Ox3 Speech/Language: Speech is without dysarthria or aphasia.   Right-sided neglect not evident today with patient able to move right side on command   Cranial Nerves:  II: PERRL.  Right hemianopsia III, IV, RU:EAVW VII: Slight right facial droop VIII: Hearing intact to voice. IX, X: Phonation is normal.  XII: Tongue protrudes midline without fasciculations. Motor: 5/5 strength to left upper and lower extremities, 3/5 strength to right upper extremity and 4/5 right lower extremities, drift noted in right upper and lower extremities Tone: is normal and bulk is normal Sensation-diminished in right leg but equal in  upper body Gait- deferred for patient's safety  Discharge Diet      Diet   Diet regular Room service appropriate? No; Fluid consistency: Thin   liquids  DISCHARGE PLAN Disposition:  Transfer to [facility] for ongoing PT, OT and ST Aspirin 81 mg daily and Brilinta 45 mg daily  for secondary stroke prevention, may resume Eliquis after subarachnoid hemorrhage and ICH resolve in 7 to 10 days after stroke and stop aspirin at the time of starting Eliquis but continue Brilinta Recommend ongoing stroke risk  factor control by Primary Care Physician at time of discharge from inpatient rehabilitation. Follow-up PCP Copland, Gwenlyn Found, MD in 2 weeks following discharge from rehab. Follow-up in [facility] with Dr. Caffie Damme in 8 weeks following discharge from rehab, office to schedule an appointment.   32 minutes were spent preparing discharge.  Cortney E Ernestina Columbia , MSN, AGACNP-BC [facility] See Amion for schedule and pager information 02/13/2023 2:43 PM   I have personally obtained history,examined this patient, reviewed notes, independently viewed imaging studies, participated in medical decision making and plan of care.ROS completed by me personally and pertinent positives fully documented  I have made any additions or clarifications directly to the above note. Agree with note above.    [name], MD Medical Director [facility] Pager: [phone] 02/13/2023 4:45 PM

## 2023-02-13 NOTE — Progress Notes (Signed)
Patient ID: Loretta Gates, female   DOB: Mar 03, 1936, 87 y.o.   MRN: [id number] Met with the patient and daughter to review current situation, rehab process,rehab schedule, team conference and plan of care. Reviewed secondary risk management including HTN, A-fib on ASA+ Brilinta for 7-10 days then back to Eliquis when directed by neurology, HF on Jardiance. Reviewed prediabetes (A1C 6.2) and weight management tips with dietary modification/tweaking recommendations. Reviewed scheduled medications with patient and daughter. Also reviewed access to MyChart with live reports from MD and therapy as she progresses through CIR.  Reviewed team conference schedule and continue to follow along to address educational needs to facilitate preparation for discharge. Pamelia Hoit

## 2023-02-13 NOTE — Progress Notes (Signed)
Inpatient Rehabilitation Admissions Coordinator   I have insurance approval and CIR bed to admit her to today. I contacted acute team and TOC and will make the arrangements  Ottie Glazier, RN, MSN Rehab Admissions Coordinator [phone] 02/13/2023 2:40 PM

## 2023-02-13 NOTE — H&P (Signed)
Physical Medicine and Rehabilitation Admission H&P    Chief Complaint  Patient presents with   Loss of Vision  : HPI: Loretta Gates is a 87 year old Left-handed female with history of atrial fibrillation on Eliquis with history of failed cardioversion, hypertension, bladder cancer status post TURBT 02/05/2023, diastolic congestive heart failure, chronic venous insufficiency, left breast cancer status postchemotherapy mastectomy, obesity with BMI 37.16, remote head and neck radiation 1940s age 23, left thyroidectomy and right inferior parathyroidectomy with chronic anemia.  Per chart review lives with spouse.  Uses a straight point cane prior to admission.  Presented 02/06/2023 with intermittent transient left eye vision loss.  Cranial CT scan showed acute subarachnoid hemorrhage in the left sylvian fissure and over the left convexity.  MRA/MRI showed acute left MCA territory infarction involving large portions of the left parietal and temporal lobes with additional small acute infarcts also present in the left cerebellum and left occipital lobe as well as redemonstrating subarachnoid hemorrhage and left sylvian fissure as well as left ICA occlusion.  Interventional radiology follow-up for nonocclusive clot with carotid stenting.  Neurology follow-up maintained on aspirin and Brilinta 90 mg twice daily plan to resume Eliquis after SAH/ICH resolved in 7 to 10 days.  She was cleared to begin Lovenox for DVT prophylaxis.  Tolerating a regular diet.  Therapy evaluations completed due to patient decreased functional mobility was admitted for a comprehensive rehab program.   Pt reports can look to R, but sees "better" when looks left.  LBM this AM- denies constipation.  Voiding well.  Denies pain.  RUE feels "awkward", but no pain.  Also said her behavior is "off" and "doesn't feel well".     Review of Systems  Constitutional:  Negative for chills and fever.  HENT:  Negative for hearing loss.    Eyes:  Positive for blurred vision and double vision.  Respiratory:  Negative for cough, shortness of breath and wheezing.   Cardiovascular:  Positive for palpitations and leg swelling.  Gastrointestinal:  Negative for constipation, heartburn, nausea and vomiting.       GERD  Genitourinary:  Negative for dysuria, flank pain and urgency.  Musculoskeletal:  Positive for joint pain and myalgias.  Skin:  Negative for rash.  Neurological:  Positive for sensory change and focal weakness.  Psychiatric/Behavioral:         Doesn't feel right  All other systems reviewed and are negative.  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 patel;  2006  s/p left thyroidectomy w/ right inferior parathyoidectomy  (per path speciman marked parathyroid but only thyroid tissue)   Hypertension    IDA (iron deficiency anemia)    hematology/ oncologist--- dr ennever/ sarah carter NP;  treated w/ IV iron infusions   Multinodular thyroid    Neuropathy    mild hands/ feet, uses cane   Nocturia more than twice per night    Nocturnal  leg cramps    OA (osteoarthritis)    knees   OAB (overactive bladder)    Osteoporosis    PAF (paroxysmal atrial fibrillation) [facility])    cardiologist--- dr Jens Som   PONV (postoperative nausea and vomiting)    Vitamin D deficiency    Wears glasses    Past Surgical History:  Procedure Laterality Date   BREAST LUMPECTOMY WITH RADIOACTIVE SEED LOCALIZATION Right 10/25/2021   Procedure: RIGHT BREAST LUMPECTOMY WITH RADIOACTIVE SEED LOCALIZATION;  Surgeon: Harriette Bouillon, MD;  Location: [facility];  Service: General;  Laterality: Right;   CARDIOVERSION N/A 01/01/2023   Procedure: CARDIOVERSION;  Surgeon: [name], MD;  Location: [facility];  Service: Cardiovascular;  Laterality: N/A;   CHOLECYSTECTOMY, LAPAROSCOPIC  02/25/2017   @[facility]    COLONOSCOPY WITH ESOPHAGOGASTRODUODENOSCOPY (EGD)  2022   IR ANGIO INTRA EXTRACRAN SEL COM CAROTID INNOMINATE UNI R MOD SED  02/07/2023   IR ANGIO VERTEBRAL SEL SUBCLAVIAN INNOMINATE UNI L MOD SED  02/07/2023   IR CT HEAD LTD  02/07/2023   IR PERCUTANEOUS ART THROMBECTOMY/INFUSION INTRACRANIAL INC DIAG ANGIO  02/07/2023   MASTECTOMY WITH AXILLARY LYMPH NODE DISSECTION Left 03/05/2007   @ [facility]   OVARIAN CYST SURGERY     age 75;   abdominal   RADIOLOGY WITH ANESTHESIA N/A 02/06/2023   Procedure: IR WITH ANESTHESIA;  Surgeon: Radiologist, Medication, MD;  Location: [facility] OR;  Service: Radiology;  Laterality: N/A;   THYROIDECTOMY, PARTIAL Left 2006   left lobectomy and right infertior parathyroidectomy   TONSILLECTOMY     child   Family History  Problem Relation Age of Onset   Sudden death Neg Hx    Social History:  reports that she has never smoked. She has never used smokeless tobacco. She reports that she does not drink alcohol and does not use drugs. Allergies:  Allergies  Allergen Reactions   Bee Venom Swelling    Bee stung her on the inside of her gum. Lips and gums started swelling. No SOB.   Levaquin [Levofloxacin] Hives and  Itching   Peanut-Containing Drug Products Other (See Comments)    Stomach pain and diarrhea    Medications Prior to Admission  Medication Sig Dispense Refill   apixaban (ELIQUIS) 5 MG TABS tablet TAKE ONE TABLET BY MOUTH TWICE A DAY 60 tablet 3   cholestyramine (QUESTRAN) 4 GM/DOSE powder Take 1 packet (4 g total) by mouth See admin instructions. Mix 1 teaspoon in water and take at 6pm daily with warfarin. (Patient taking differently: Take 4 g by mouth See admin instructions. Mix 1 teaspoon in water and take daily) 378 g 3   cinacalcet (SENSIPAR) 30 MG tablet Take 30 mg by mouth daily with breakfast.     denosumab (PROLIA) 60 MG/ML SOSY injection Inject 60 mg into the skin every 6 (six) months.     empagliflozin (JARDIANCE) 10 MG TABS tablet TAKE ONE TABLET BY MOUTH ONE TIME DAILY (Patient taking differently: Take 10 mg by mouth daily.) 30 tablet 3   ergocalciferol (VITAMIN D2) 1.25 MG (50000 UT)  capsule Take 50,000 Units by mouth every 14 (fourteen) days. Every other saturday     furosemide (LASIX) 20 MG tablet Alternate taking 20 mg po daily with taking 30 mg po daily ever other day (Patient taking differently: Take 20 mg by mouth as directed. Alternate taking 20 mg po daily with taking 30 mg po daily ever other day) 135 tablet 3   Magnesium Oxide -Mg Supplement 500 MG TABS Take 500 mg by mouth daily.     metoprolol tartrate (LOPRESSOR) 50 MG tablet Take 1.5 tablets (75 mg total) by mouth 2 (two) times daily. 270 tablet 1   omeprazole (PRILOSEC) 40 MG capsule Take 1 capsule (40 mg total) by mouth 2 (two) times daily before lunch and supper. (Patient taking differently: Take 40 mg by mouth in the morning and at bedtime.) 180 capsule 1   oxybutynin (DITROPAN XL) 15 MG 24 hr tablet Take 1 tablet (15 mg total) by mouth at bedtime. (Patient taking differently: Take 15 mg by mouth at bedtime.) 90 tablet 3   traMADol (ULTRAM) 50 MG tablet Take 1 tablet (50 mg total) by mouth every 6 (six) hours as  needed. (Patient not taking: Reported on 02/06/2023) 20 tablet 0      Home: Home Living Family/patient expects to be discharged to:: Private residence Living Arrangements: Spouse/significant other, Children Available Help at Discharge: Family, Available 24 hours/day (dtr works but states can take time off) Type of Home: House Home Access: Stairs to enter Secretary/administrator of Steps: 4 Entrance Stairs-Rails: Right, Left, Can reach both Home Layout: Able to live on main level with bedroom/bathroom, Laundry or work area in basement [employer] Shower/Tub: Engineer, manufacturing systems: Pharmacist, community: Yes Home Equipment: Information systems manager, Medical laboratory scientific officer - quad, Medical laboratory scientific officer - single point, Wheelchair - manual, Agricultural consultant (2 wheels), Crutches, Adaptive equipment, Grab bars - tub/shower [employer] Equipment: Sock aid  Lives With: Spouse   Functional History: Prior Function Prior Level of Function : Independent/Modified Independent Mobility Comments: SPC used during the day. Quad cane is used during the night. ADLs Comments: has sock aid, needs help with fastening bra  Functional Status:  Mobility: Bed Mobility Overal bed mobility: Needs Assistance Bed Mobility: Supine to Sit Supine to sit: Mod assist, +2 for physical assistance, HOB elevated Sit to supine: Total assist General bed mobility comments: received in recliner Transfers Overall transfer level: Needs assistance Equipment used: 2 person hand held assist Transfers: Sit to/from Stand, Bed to chair/wheelchair/BSC Sit to Stand: Mod assist, +2 physical assistance, From elevated surface Bed to/from chair/wheelchair/BSC transfer type:: Step pivot Stand pivot transfers: Total assist, +2 physical assistance Step pivot transfers: Max assist, +2 physical assistance General transfer comment: 2 person assist provided for sit to stand, needed mod A on R, min A on L. Performed >3 times from recliner and small armchair in  room Ambulation/Gait Ambulation/Gait assistance: Mod assist, +2 physical assistance, +2 safety/equipment Gait Distance (Feet): 6 Feet (2x) Assistive device: 2 person hand held assist Gait Pattern/deviations: Step-to pattern, Decreased stride length, Decreased dorsiflexion - right, Decreased weight shift to right, Knee flexed in stance - right, Narrow base of support General Gait Details: pt with small steps and minimal clearance. max directional cues and assist due to R lateral lean. Gait velocity: decreased Gait velocity interpretation: <1.31 ft/sec, indicative of household ambulator    ADL: ADL Overall ADL's : Needs assistance/impaired Eating/Feeding: Maximal assistance, Bed level Grooming: Oral care, Standing, Moderate assistance Grooming Details (indicate cue type and reason): brushing teeth while  standing at sink. 2 person assist to maintain standing balance. Supplies placed in right visual field to encourage visual scanning. Max difficulty and increased time required to scan and locate items. Upper Body Bathing: Total assistance, Bed level Lower Body Bathing: Total assistance, Bed level Upper Body Dressing : Total assistance, Sitting Lower Body Dressing: Total assistance, Bed level Toilet Transfer: Total assistance, +2 for physical assistance, +2 for safety/equipment, Stand-pivot, Cueing for sequencing, Cueing for safety Toilet Transfer Details (indicate cue type and reason): Total A of 2 to transfer Toileting- Clothing Manipulation and Hygiene: Total assistance, +2 for physical assistance, +2 for safety/equipment, Sitting/lateral lean, Sit to/from stand Toileting - Clothing Manipulation Details (indicate cue type and reason): RN assisting with peri-care Functional mobility during ADLs: Total assistance, +2 for physical assistance, +2 for safety/equipment, Cueing for safety, Cueing for sequencing General ADL Comments: Patient session focus on increasing activity tolerance, overall  cognition, and participation in ADL tasks.  Cognition: Cognition Overall Cognitive Status: Impaired/Different from baseline Arousal/Alertness: Lethargic Orientation Level: Oriented to person, Oriented to place, Oriented to time Attention: Sustained Sustained Attention: Impaired Sustained Attention Impairment: Verbal complex, Functional complex Memory: Impaired Memory Impairment: Retrieval deficit Awareness: Impaired Awareness Impairment: Intellectual impairment Problem Solving: Impaired Problem Solving Impairment: Verbal complex Cognition Arousal/Alertness: Awake/alert Behavior During Therapy: WFL for tasks assessed/performed Overall Cognitive Status: Impaired/Different from baseline Area of Impairment: Attention, Memory, Following commands, Safety/judgement, Awareness, Problem solving, Orientation Orientation Level: Disoriented to, Situation Current Attention Level: Focused Memory: Decreased short-term memory Following Commands: Follows one step commands with increased time Safety/Judgement: Decreased awareness of deficits, Decreased awareness of safety Problem Solving: Slow processing, Requires tactile cues, Requires verbal cues, Difficulty sequencing, Decreased initiation General Comments: difficult to assess due to expressive aphasia, frequently says, "that was awkward" in multiple situations  Physical Exam: Blood pressure 132/74, pulse 79, temperature 99.1 F (37.3 C), temperature source Oral, resp. rate 18, height 4\' 11"  (1.499 m), weight 83.5 kg, SpO2 98 %. Physical Exam Vitals and nursing note reviewed. Exam conducted with a chaperone present.  Constitutional:      General: She is not in acute distress.    Appearance: She is obese. She is ill-appearing.     Comments: Pt sitting up in bedside chair; awake, alert, husband holding her hand as they watch TV, NAD  HENT:     Head: Normocephalic and atraumatic.     Comments: Mild R facial droop Tongue midline R face  sensation is absent    Right Ear: External ear normal.     Left Ear: External ear normal.     Nose: Nose normal. No congestion.     Mouth/Throat:     Mouth: Mucous membranes are dry.     Pharynx: Oropharynx is clear. No oropharyngeal exudate.  Eyes:     General:        Right eye: No discharge.        Left eye: No discharge.     Comments: L gaze preference- R visual field appears reduced on exam  Cardiovascular:     Rate and Rhythm: Normal rate. Rhythm irregular.     Heart sounds: Normal heart sounds. No murmur heard.    No gallop.  Pulmonary:     Effort: Pulmonary effort is normal. No respiratory distress.     Breath sounds: Normal breath sounds. No wheezing, rhonchi or rales.  Abdominal:     General: Bowel sounds are normal. There is no distension.     Palpations: Abdomen is soft.  Tenderness: There is no abdominal tenderness.  Musculoskeletal:     Cervical back: Neck supple. No tenderness.     Comments: LUE 5/5 in Biceps, triceps, WE, grip and FA RUE- biceps 3/5; Triceps 3/5; WE 2/5; grip 2/5; and FA 2-/5 LLE- 5/5 in HF, KE, KF, DF and PF RLE- HF 2-/5; KE/KF 3-/5; DF 3-/5 and PF 3/5  Skin:    General: Skin is warm and dry.     Comments: RUE IV's x2- look OK No skin breakdown seen  Neurological:     Mental Status: She is alert.     Comments: Patient is alert.  Noted right-sided neglect.  Provides name and age.  Follows simple commands. Decreased sensation to light touch on RUE/RLE R sided neglect noted L gaze preference  Psychiatric:     Comments: Flat, somewhat interactive     No results found for this or any previous visit (from the past 48 hour(s)). No results found.    Blood pressure 132/74, pulse 79, temperature 99.1 F (37.3 C), temperature source Oral, resp. rate 18, height 4\' 11"  (1.499 m), weight 83.5 kg, SpO2 98 %.  Medical Problem List and Plan: 1. Functional deficits secondary to large left MCA stroke/SAH due to left ICA occlusion and MCA  nonocclusive clot status post stenting.  -patient may  shower  -ELOS/Goals: 17-24 days min A to supervision 2.  Antithrombotics: -DVT/anticoagulation:  Pharmaceutical: Heparin  -antiplatelet therapy: Aspirin 81 mg daily and Brilinta 90 mg twice daily.  May resume Eliquis after SAH/ICH resolved in 7 to 10 days. 3. Pain Management: Tylenol as needed 4. Mood/Behavior/Sleep: Provide emotional support  -antipsychotic agents: Seroquel 25 mg nightly as needed 5. Neuropsych/cognition: This patient is? capable of making decisions on her own behalf. 6. Skin/Wound Care: Routine skin checks 7. Fluids/Electrolytes/Nutrition: Routine in and outs with follow-up chemistries 8.  Atrial fibrillation with history of failed cardioversion.  Lopressor 75 mg twice daily.  Eliquis may be resumed after SAH/ICH resolved 7 to 10 days 9.  GERD.  Protonix 10.  Diastolic congestive heart failure.  Monitor for any signs of fluid overload- suggest daily weights.  11.  History of left breast cancer with chemotherapy and mastectomy.  Follow-up outpatient 12.  Morbid obesity.  BMI 37.16.  Dietary follow-up 13.  Chronic anemia.  Follow-up CBC    Charlton Amor, PA-C 02/13/2023  I have personally performed a face to face diagnostic evaluation of this patient and formulated the key components of the plan.  Additionally, I have personally reviewed laboratory data, imaging studies, as well as relevant notes and concur with the physician assistant's documentation above.   The patient's status has not changed from the original H&P.  Any changes in documentation from the acute care chart have been noted above.

## 2023-02-13 NOTE — Progress Notes (Signed)
Inpatient Rehabilitation Admissions Coordinator   I met at bedside with patient, daughter and spouse. We reviewed estimated cost of care if approved by [facility] for CIR admit. I await insurance approval.  Ottie Glazier, RN, MSN Rehab Admissions Coordinator [phone] 02/13/2023 12:29 PM

## 2023-02-13 NOTE — TOC Transition Note (Signed)
Transition of Care [facility]) - CM/SW Discharge Note   Patient Details  Name: Loretta Gates MRN: [id number] Date of Birth: April 17, 1936  Transition of Care [facility]) CM/SW Contact:  Kermit Balo, RN Phone Number: 02/13/2023, 2:39 PM   Clinical Narrative:    Pt is discharging to CIR today. CM signing off.    Final next level of care: IP Rehab Facility Barriers to Discharge: No Barriers Identified   Patient Goals and CMS Choice   Choice offered to / list presented to : Patient  Discharge Placement                         Discharge Plan and Services Additional resources added to the After Visit Summary for                                       Social Determinants of Health (SDOH) Interventions SDOH Screenings   Food Insecurity: No Food Insecurity (10/21/2022)  Housing: Low Risk  (10/21/2022)  Transportation Needs: No Transportation Needs (10/21/2022)  Utilities: Not At Risk (10/21/2022)  Alcohol Screen: Low Risk  (01/30/2023)  Depression (PHQ2-9): Low Risk  (01/30/2023)  Financial Resource Strain: Low Risk  (01/27/2022)  Physical Activity: Insufficiently Active (01/27/2022)  Social Connections: Socially Integrated (01/27/2022)  Stress: No Stress Concern Present (01/27/2022)  Tobacco Use: Low Risk  (02/08/2023)     Readmission Risk Interventions     No data to display

## 2023-02-13 NOTE — Progress Notes (Signed)
[name], MD  Physician Physical Medicine and Rehabilitation   PMR Pre-admission    Signed   Date of Service: 02/10/2023  1:04 PM  Related encounter: ED to Hosp-Admission (Current) from 02/06/2023 in [place] [facility]   Signed      Show:Clear all [x] Written[x] Templated[x] Copied  Added by: [x] Standley Brooking, RN  [] Hover for details PMR Admission Coordinator Pre-Admission Assessment   Patient: Loretta Gates is an 87 y.o., female MRN: [id number] DOB: 06-10-1936 Height: 4\' 11"  (149.9 cm) Weight: 83.5 kg                                                                                                              Insurance Information HMO:     PPO: yes     PCP:      IPA:      80/20:      OTHER:  PRIMARY: [employer] Medicare      Policy#: [id number]     Subscriber: pt CM Name: Lowella Dandy      Phone#: [phone]     Fax#: [phone] Pre-Cert#: [id number] approved until 5/19 when updates are due      Employer:  Benefits:  Phone #: [phone]     Name: 5/6 Eff. Date: 10/09/22     Deduct: none      Out of Pocket Max: $4500      Life Max: none  CIR: $295 co pay per day days 1 until 6      SNF: no co pay per day days 1 until 20; $203 co pay per day days 21 until 100 Outpatient: $20 per visit     Co-Pay: visits per medical neccesity Home Health: 100%      Co-Pay: visits per medical neccesity DME: 80%     Co-Pay: 20% Providers: in network  SECONDARY: none   Financial Counselor:       Phone#:    The Data processing manager" for patients in Inpatient Rehabilitation Facilities with attached "Privacy Act Statement-Health Care Records" was provided and verbally reviewed with: Family   Emergency Contact Information Contact Information       Name Relation Home Work Mobile    Eckerson,[name] Daughter     [phone]    Greisy, Derflinger     [phone]    Seago,Dawn Relative     [phone]         Current Medical History  Patient  Admitting Diagnosis: CVA   History of Present Illness:  87 y.o. female with a past medical history of atrial fibrillation on Eliquis (which she had been holding for bladder surgery), hypertension, bladder cancer (with hx of TURBT on 02/05/23), left breast cancer who presented on 02/06/2023 with a 2-day history of visual changes in her right eye.    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  She was taken to IR for angiogram and revascularization of the left carotid artery as well as includes a left M2 segment.  Post radiology CT demonstrated small left subarachnoid hemorrhage  potentially.  MRI revealed a left MCA infarct involving large portions of the left parietal and temporal lobes and to a lesser degree the left frontal lobe and insula.  There were additional small acute infarcts also present in the left cerebellum and left occipital lobe.  Subarachnoid hemorrhage was re demonstrated in the left sylvian fissure and over the posterior left frontal lobe.  Patient was placed on aspirin 81 mg daily as well as Brilinta 90 mg twice daily given her subarachnoid hemorrhage.  Plan is to resume Eliquis after subarachnoid hemorrhage resolves.  Patient's also had hematuria related to her bladder cancer in prior anticoagulation. Noted her heart rate increased to 156 upon standing.  Rate eventually climbed to 180 which resolved with IV metoprolol.     Complete NIHSS TOTAL: 14 Glasgow Coma Scale Score: 14   Patient's [facility] has been reviewed by the rehabilitation admission coordinator and physician.   Past Medical History      Past Medical History:  Diagnosis Date   Anemia     Bladder cancer [facility])      urologist--- dr pace   Chronic diastolic (congestive) heart failure (HCC)      followed by dr Jens Som   Chronic venous insufficiency     Edema of both lower extremities      per pt wears compression hose   GERD (gastroesophageal reflux disease)     History of cancer  chemotherapy      completed 2008 for left breast cancer   History of head and neck radiation 1946    per pt approx 1946 or 1947  (age 74) due to profound bilateral hearing loss told from scarlett fever/ measles,  once weekly for several weeks had  head/ neck radiation,  hearing was restored without neededing hearing aids   History of left breast cancer 2008    malignant neoplasm of overlapping sites of left breast, ER+;  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 patel;   2006  s/p left thyroidectomy w/ right inferior parathyoidectomy  (per path speciman marked parathyroid but only thyroid tissue)   Hypertension     IDA (iron deficiency anemia)      hematology/ oncologist--- dr ennever/ sarah carter NP;  treated w/ IV iron infusions   Multinodular thyroid     Neuropathy      mild hands/ feet, uses cane   Nocturia more than twice per night     Nocturnal leg cramps     OA (osteoarthritis)      knees   OAB (overactive bladder)     Osteoporosis     PAF (paroxysmal atrial fibrillation) [facility])      cardiologist--- dr Jens Som   PONV (postoperative nausea and vomiting)     Vitamin D deficiency     Wears glasses      Has the patient had major surgery during 100 days prior to admission? Yes   Family History  family history is not on file.   Current Medications    Current Facility-Administered Medications:    acetaminophen (TYLENOL) tablet 650 mg, 650 mg, Oral, Q4H PRN, 650 mg at 02/12/23 0834 **OR** acetaminophen (TYLENOL) 160 MG/5ML solution 650 mg, 650 mg, Per Tube, Q4H PRN, 650 mg at 02/10/23 0221 **OR** acetaminophen (TYLENOL) suppository 650 mg, 650 mg, Rectal, Q4H PRN, Julieanne Cotton, MD  aspirin chewable tablet 81 mg, 81 mg, Oral, Daily, 81 mg at 02/13/23 1057 **OR** aspirin chewable tablet 81 mg, 81 mg, Per Tube, Daily, Deveshwar, Sanjeev, MD    cholestyramine (QUESTRAN) packet 4 g, 4 g, Oral, q1800, de Saintclair Halsted, Cortney E, NP, 4 g at 02/12/23 1830   enoxaparin (LOVENOX) injection 40 mg, 40 mg, Subcutaneous, Q24H, Marvel Plan, MD, 40 mg at 02/13/23 1056   metoprolol tartrate (LOPRESSOR) injection 5 mg, 5 mg, Intravenous, Q5 min PRN, Bhagat, Srishti L, MD, 5 mg at 02/08/23 0108   metoprolol tartrate (LOPRESSOR) tablet 75 mg, 75 mg, Oral, BID, Bhagat, Srishti L, MD, 75 mg at 02/13/23 1057   Oral care mouth rinse, 15 mL, Mouth Rinse, PRN, Bhagat, Srishti L, MD   oxybutynin (DITROPAN-XL) 24 hr tablet 15 mg, 15 mg, Oral, QHS, de La Torre, Elkton E, NP, 15 mg at 02/12/23 2206   pantoprazole (PROTONIX) EC tablet 40 mg, 40 mg, Oral, Daily, Marvel Plan, MD, 40 mg at 02/13/23 1056   QUEtiapine (SEROQUEL) tablet 25 mg, 25 mg, Oral, QHS PRN, Pamalee Leyden, Devon, NP   senna-docusate (Senokot-S) tablet 1 tablet, 1 tablet, Oral, QHS PRN, Bhagat, Srishti L, MD   ticagrelor (BRILINTA) tablet 45 mg, 45 mg, Oral, BID, Gershon Crane, PA-C, 45 mg at 02/13/23 1057   Patients Current Diet:  Diet Order                  Diet regular Room service appropriate? No; Fluid consistency: Thin  Diet effective now                       Precautions / Restrictions Precautions Precautions: Fall Precaution Comments: decreased right visual and physical attention Restrictions Weight Bearing Restrictions: No    Has the patient had 2 or more falls or a fall with injury in the past year?No   Prior Activity Level Community (5-7x/wk): Mod I with cane, active and driving   Prior Functional Level Prior Function Prior Level of Function : Independent/Modified Independent Mobility Comments: SPC used during the day. Quad cane is used during the night. ADLs Comments: has sock aid, needs help with fastening bra   Self Care: Did the patient need help bathing, dressing, using the toilet or eating?  Independent   Indoor Mobility: Did the patient need assistance with  walking from room to room (with or without device)? Independent   Stairs: Did the patient need assistance with internal or external stairs (with or without device)? Independent   Functional Cognition: Did the patient need help planning regular tasks such as shopping or remembering to take medications? Independent   Patient Information Are you of Hispanic, Latino/a,or Spanish origin?: X. Patient unable to respond (dtr states no) What is your race?: X. Patient unable to respond (dtr states white) Do you need or want an interpreter to communicate with a doctor or health care staff?: 9. Unable to respond (dtr states no)   Patient's Response To:  Health Literacy and Transportation Is the patient able to respond to health literacy and transportation needs?: No Health Literacy - How often do you need to have someone help you when you read instructions, pamphlets, or other written material from your doctor or pharmacy?: Patient unable to respond In the past 12 months, has lack of transportation kept you from medical appointments or from getting medications?: No (dtr states no) In the past 12 months, has lack of transportation kept you from meetings, work, or from getting  things needed for daily living?: No (dtr states no)   Home Assistive Devices / Equipment Home Equipment: Shower seat, Cane - quad, Medical laboratory scientific officer - single point, Wheelchair - manual, Agricultural consultant (2 wheels), Crutches, Adaptive equipment, Grab bars - tub/shower   Prior Device Use: Indicate devices/aids used by the patient prior to current illness, exacerbation or injury?  cane   Current Functional Level Cognition   Arousal/Alertness: Lethargic Overall Cognitive Status: Impaired/Different from baseline Current Attention Level: Focused Orientation Level: Oriented to person, Oriented to place, Oriented to time Following Commands: Follows one step commands with increased time Safety/Judgement: Decreased awareness of deficits, Decreased  awareness of safety General Comments: difficult to assess due to expressive aphasia, frequently says, "that was awkward" in multiple situations Attention: Sustained Sustained Attention: Impaired Sustained Attention Impairment: Verbal complex, Functional complex Memory: Impaired Memory Impairment: Retrieval deficit Awareness: Impaired Awareness Impairment: Intellectual impairment Problem Solving: Impaired Problem Solving Impairment: Verbal complex    Extremity Assessment (includes Sensation/Coordination)   Upper Extremity Assessment: Defer to OT evaluation RUE Deficits / Details: Demonstrates generalized weakness including gross grasp. Able to raise UE to touch therapist's hand above pt's shoulder height. Slow motor movement. Impaired finger nose test. RUE Coordination: decreased fine motor, decreased gross motor LUE Deficits / Details: demonstrates generalized weakness. A/ROM appears WFL.  Lower Extremity Assessment: Generalized weakness (pt unable to follow commands to complete MMT, able to stand without buckling)     ADLs   Overall ADL's : Needs assistance/impaired Eating/Feeding: Maximal assistance, Bed level Grooming: Oral care, Standing, Moderate assistance Grooming Details (indicate cue type and reason): brushing teeth while standing at sink. 2 person assist to maintain standing balance. Supplies placed in right visual field to encourage visual scanning. Max difficulty and increased time required to scan and locate items. Upper Body Bathing: Total assistance, Bed level Lower Body Bathing: Total assistance, Bed level Upper Body Dressing : Total assistance, Sitting Lower Body Dressing: Total assistance, Bed level Toilet Transfer: Total assistance, +2 for physical assistance, +2 for safety/equipment, Stand-pivot, Cueing for sequencing, Cueing for safety Toilet Transfer Details (indicate cue type and reason): Total A of 2 to transfer Toileting- Clothing Manipulation and Hygiene:  Total assistance, +2 for physical assistance, +2 for safety/equipment, Sitting/lateral lean, Sit to/from stand Toileting - Clothing Manipulation Details (indicate cue type and reason): RN assisting with peri-care Functional mobility during ADLs: Total assistance, +2 for physical assistance, +2 for safety/equipment, Cueing for safety, Cueing for sequencing General ADL Comments: Patient session focus on increasing activity tolerance, overall cognition, and participation in ADL tasks.     Mobility   Overal bed mobility: Needs Assistance Bed Mobility: Supine to Sit Supine to sit: Mod assist, +2 for physical assistance, HOB elevated Sit to supine: Total assist General bed mobility comments: received in recliner     Transfers   Overall transfer level: Needs assistance Equipment used: 2 person hand held assist Transfers: Sit to/from Stand, Bed to chair/wheelchair/BSC Sit to Stand: Mod assist, +2 physical assistance, From elevated surface Bed to/from chair/wheelchair/BSC transfer type:: Step pivot Stand pivot transfers: Total assist, +2 physical assistance Step pivot transfers: Max assist, +2 physical assistance General transfer comment: 2 person assist provided for sit to stand, needed mod A on R, min A on L. Performed >3 times from recliner and small armchair in room     Ambulation / Gait / Stairs / Wheelchair Mobility   Ambulation/Gait Ambulation/Gait assistance: Mod assist, +2 physical assistance, +2 safety/equipment Gait Distance (Feet): 6 Feet (2x) Assistive device: 2 person  hand held assist Gait Pattern/deviations: Step-to pattern, Decreased stride length, Decreased dorsiflexion - right, Decreased weight shift to right, Knee flexed in stance - right, Narrow base of support General Gait Details: pt with small steps and minimal clearance. max directional cues and assist due to R lateral lean. Gait velocity: decreased Gait velocity interpretation: <1.31 ft/sec, indicative of household  ambulator     Posture / Balance Dynamic Sitting Balance Sitting balance - Comments: able to maintain balance sitting edge of chair Balance Overall balance assessment: Needs assistance Sitting-balance support: Single extremity supported, Feet supported Sitting balance-Leahy Scale: Fair Sitting balance - Comments: able to maintain balance sitting edge of chair Postural control: Posterior lean, Right lateral lean Standing balance support: Bilateral upper extremity supported, During functional activity Standing balance-Leahy Scale: Poor Standing balance comment: mod A needed on L side as well as B UE support. Stood at sink working on erect posture and reaching with LUE and assisted reaching with RUE     Special needs/care consideration Fall precautions    Previous Home Environment  Living Arrangements: Spouse/significant other, Children  Lives With: Spouse Available Help at Discharge: Family, Available 24 hours/day (dtr works but states can take time off) Type of Home: House Home Layout: Able to live on main level with bedroom/bathroom, Laundry or work area in basement Home Access: Stairs to enter Entrance Stairs-Rails: Right, Left, Can reach both Entrance Stairs-Number of Steps: 4 Bathroom Shower/Tub: Engineer, manufacturing systems: Standard Bathroom Accessibility: Yes How Accessible: Accessible via walker Home Care Services: No   Discharge Living Setting Plans for Discharge Living Setting: Patient's home, Lives with (comment) (spouse) Type of Home at Discharge: House Discharge Home Layout: Able to live on main level with bedroom/bathroom Discharge Home Access: Stairs to enter Entrance Stairs-Rails: Right, Left, Can reach both Entrance Stairs-Number of Steps: 4 Discharge Bathroom Shower/Tub: Tub/shower unit Discharge Bathroom Toilet: Standard Discharge Bathroom Accessibility: Yes How Accessible: Accessible via walker Does the patient have any problems obtaining your  medications?: No   Social/Family/Support Systems Patient Roles: Spouse, Parent Contact Information: daughter and spouse Anticipated Caregiver: daughter and spouse Anticipated Caregiver's Contact Information: see contacts Ability/Limitations of Caregiver: dtr states she can take time off from work Caregiver Availability: 24/7 Discharge Plan Discussed with Primary Caregiver: Yes Is Caregiver In Agreement with Plan?: Yes Does Caregiver/Family have Issues with Lodging/Transportation while Pt is in Rehab?: No   Goals Patient/Family Goal for Rehab: supervision to min with PT, OT and SLP Expected length of stay: ELOS 17 to 24 days Pt/Family Agrees to Admission and willing to participate: Yes Program Orientation Provided & Reviewed with Pt/Caregiver Including Roles  & Responsibilities: Yes   Decrease burden of Care through IP rehab admission: n/a   Possible need for SNF placement upon discharge:not anticipated   Patient Condition: This patient's medical and functional status has changed since the consult dated: 02/08/23 in which the Rehabilitation Physician determined and documented that the patient's condition is appropriate for intensive rehabilitative care in an inpatient rehabilitation facility. See "History of Present Illness" (above) for medical update. Functional changes are: overall mod assist. Patient's medical and functional status update has been discussed with the Rehabilitation physician and patient remains appropriate for inpatient rehabilitation. Will admit to inpatient rehab today.   Preadmission Screen Completed By:  Clois Dupes, RN MSN 02/13/2023 2:50 PM ______________________________________________________________________   Discussed status with Dr. Berline Chough on 02/13/23 at 1450 and received approval for admission today.   Admission Coordinator:  Worthy Keeler MSN time [id number] Date  02/13/23            Revision History

## 2023-02-14 DIAGNOSIS — I63512 Cerebral infarction due to unspecified occlusion or stenosis of left middle cerebral artery: Secondary | ICD-10-CM | POA: Diagnosis not present

## 2023-02-14 LAB — COMPREHENSIVE METABOLIC PANEL
ALT: 24 U/L (ref 0–44)
AST: 27 U/L (ref 15–41)
Albumin: 2.9 g/dL — ABNORMAL LOW (ref 3.5–5.0)
Alkaline Phosphatase: 45 U/L (ref 38–126)
Anion gap: 10 (ref 5–15)
BUN: 16 mg/dL (ref 8–23)
CO2: 21 mmol/L — ABNORMAL LOW (ref 22–32)
Calcium: 9.7 mg/dL (ref 8.9–10.3)
Chloride: 105 mmol/L (ref 98–111)
Creatinine, Ser: 0.71 mg/dL (ref 0.44–1.00)
GFR, Estimated: >60 mL/min (ref >60)
Glucose, Bld: 106 mg/dL — ABNORMAL HIGH (ref 70–99)
Potassium: 4.2 mmol/L (ref 3.5–5.1)
Sodium: 136 mmol/L (ref 135–145)
Total Bilirubin: 0.5 mg/dL (ref 0.3–1.2)
Total Protein: 5.4 g/dL — ABNORMAL LOW (ref 6.5–8.1)

## 2023-02-14 LAB — CBC
HCT: 44.7 % (ref 36.0–46.0)
Hemoglobin: 14.4 g/dL (ref 12.0–15.0)
MCH: 28 pg (ref 26.0–34.0)
MCHC: 32.2 g/dL (ref 30.0–36.0)
MCV: 87 fL (ref 80.0–100.0)
Platelets: 259 10*3/uL (ref 150–400)
RBC: 5.14 MIL/uL — ABNORMAL HIGH (ref 3.87–5.11)
RDW: 17 % — ABNORMAL HIGH (ref 11.5–15.5)
WBC: 8.8 10*3/uL (ref 4.0–10.5)
nRBC: 0 % (ref 0.0–0.2)

## 2023-02-14 LAB — CBC WITH DIFFERENTIAL/PLATELET
Abs Immature Granulocytes: 0.06 10*3/uL (ref 0.00–0.07)
Basophils Absolute: 0 10*3/uL (ref 0.0–0.1)
Basophils Relative: 1 %
Eosinophils Absolute: 0.3 10*3/uL (ref 0.0–0.5)
Eosinophils Relative: 4 %
HCT: 43.5 % (ref 36.0–46.0)
Hemoglobin: 13.7 g/dL (ref 12.0–15.0)
Immature Granulocytes: 1 %
Lymphocytes Relative: 24 %
Lymphs Abs: 1.6 10*3/uL (ref 0.7–4.0)
MCH: 28.2 pg (ref 26.0–34.0)
MCHC: 31.5 g/dL (ref 30.0–36.0)
MCV: 89.7 fL (ref 80.0–100.0)
Monocytes Absolute: 0.7 10*3/uL (ref 0.1–1.0)
Monocytes Relative: 10 %
Neutro Abs: 4 10*3/uL (ref 1.7–7.7)
Neutrophils Relative %: 60 %
Platelets: 228 10*3/uL (ref 150–400)
RBC: 4.85 MIL/uL (ref 3.87–5.11)
RDW: 16.4 % — ABNORMAL HIGH (ref 11.5–15.5)
WBC: 6.6 10*3/uL (ref 4.0–10.5)
nRBC: 0 % (ref 0.0–0.2)

## 2023-02-14 LAB — IRON AND TIBC
Iron: 53 ug/dL (ref 28–170)
Saturation Ratios: 17 % (ref 10.4–31.8)
TIBC: 322 ug/dL (ref 250–450)
UIBC: 269 ug/dL

## 2023-02-14 LAB — VITAMIN D 25 HYDROXY (VIT D DEFICIENCY, FRACTURES): Vit D, 25-Hydroxy: 47.23 ng/mL (ref 30–100)

## 2023-02-14 LAB — CREATININE, SERUM
Creatinine, Ser: 0.82 mg/dL (ref 0.44–1.00)
GFR, Estimated: >60 mL/min (ref >60)

## 2023-02-14 LAB — MAGNESIUM: Magnesium: 1.9 mg/dL (ref 1.7–2.4)

## 2023-02-14 LAB — GLUCOSE, CAPILLARY: Glucose-Capillary: 112 mg/dL — ABNORMAL HIGH (ref 70–99)

## 2023-02-14 MED ORDER — CINACALCET HCL 30 MG PO TABS
30.0000 mg | ORAL_TABLET | Freq: Every day | ORAL | Status: DC
  Administered 2023-02-14 – 2023-02-25 (×12): 30 mg via ORAL
  Filled 2023-02-14 (×12): qty 1

## 2023-02-14 MED ORDER — VITAMIN D (ERGOCALCIFEROL) 1.25 MG (50000 UNIT) PO CAPS
50000.0000 [IU] | ORAL_CAPSULE | ORAL | Status: DC
  Administered 2023-02-14 – 2023-02-21 (×2): 50000 [IU] via ORAL
  Filled 2023-02-14 (×3): qty 1

## 2023-02-14 MED ORDER — EMPAGLIFLOZIN 10 MG PO TABS
10.0000 mg | ORAL_TABLET | Freq: Every day | ORAL | Status: DC
  Administered 2023-02-14 – 2023-02-25 (×12): 10 mg via ORAL
  Filled 2023-02-14 (×12): qty 1

## 2023-02-14 MED ORDER — MAGNESIUM SULFATE IN D5W 1-5 GM/100ML-% IV SOLN
1.0000 g | Freq: Once | INTRAVENOUS | Status: AC
  Administered 2023-02-14: 1 g via INTRAVENOUS
  Filled 2023-02-14: qty 100

## 2023-02-14 MED ORDER — MAGNESIUM OXIDE -MG SUPPLEMENT 400 (240 MG) MG PO TABS
400.0000 mg | ORAL_TABLET | Freq: Every day | ORAL | Status: DC
  Administered 2023-02-14 – 2023-02-25 (×12): 400 mg via ORAL
  Filled 2023-02-14 (×12): qty 1

## 2023-02-14 NOTE — Discharge Instructions (Addendum)
Inpatient Rehab Discharge Instructions  Loretta Gates Discharge date and time: 02/25/2023   Activities/Precautions/ Functional Status: Activity: activity as tolerated Diet: regular diet Wound Care: Routine skin checks Functional status:  ___ No restrictions     ___ Walk up steps independently ___ 24/7 supervision/assistance   ___ Walk up steps with assistance _x__ Intermittent supervision/assistance  ___ Bathe/dress independently ___ Walk with walker     _x__ Bathe/dress with assistance ___ Walk Independently    ___ Shower independently ___ Walk with assistance    _x__ Shower with assistance _x__ No alcohol     ___ Return to work/school ________  Special Instructions: No driving smoking or alcohol  Recommend daily BP measurement in same arm and record time of day. Bring this information with you to follow-up appointment with PCP.  COMMUNITY REFERRALS UPON DISCHARGE:    Home Health:   PT   OT   SP                 Agency:[facility] Phone: [phone]    Medical Equipment/Items Ordered:3 IN 1 HAS ROLLING WALKER AND WILL GET TUB BENCH ON OWN                                                 Agency/Supplier:[facility]   [phone]     STROKE/TIA DISCHARGE INSTRUCTIONS SMOKING Cigarette smoking nearly doubles your risk of having a stroke & is the single most alterable risk factor  If you smoke or have smoked in the last 12 months, you are advised to quit smoking for your health. Most of the excess cardiovascular risk related to smoking disappears within a year of stopping. Ask you doctor about anti-smoking medications [place] Quit Line: 1-800-QUIT NOW Free Smoking Cessation Classes [phone]  CHOLESTEROL Know your levels; limit fat & cholesterol in your diet  Lipid Panel     Component Value Date/Time   CHOL 122 02/07/2023 0128   TRIG 92 02/07/2023 0128   HDL 46 02/07/2023 0128   CHOLHDL 2.7 02/07/2023 0128   VLDL 18 02/07/2023 0128   LDLCALC 58 02/07/2023  0128     Many patients benefit from treatment even if their cholesterol is at goal. Goal: Total Cholesterol (CHOL) less than 160 Goal:  Triglycerides (TRIG) less than 150 Goal:  HDL greater than 40 Goal:  LDL (LDLCALC) less than 100   BLOOD PRESSURE American Stroke Association blood pressure target is less that 120/80 mm/Hg  Your discharge blood pressure is:  BP: 118/74 Monitor your blood pressure Limit your salt and alcohol intake Many individuals will require more than one medication for high blood pressure  DIABETES (A1c is a blood sugar average for last 3 months) Goal HGBA1c is under 7% (HBGA1c is blood sugar average for last 3 months)  Diabetes:    Lab Results  Component Value Date   HGBA1C 6.2 (H) 02/07/2023    Your HGBA1c can be lowered with medications, healthy diet, and exercise. Check your blood sugar as directed by your physician Call your physician if you experience unexplained or low blood sugars.  PHYSICAL ACTIVITY/REHABILITATION Goal is 30 minutes at least 4 days per week  Activity: Increase activity slowly, Therapies: Physical Therapy: Home Health Return to work:  Activity decreases your risk of heart attack and stroke and makes your heart stronger.  It helps control your  weight and blood pressure; helps you relax and can improve your mood. Participate in a regular exercise program. Talk with your doctor about the best form of exercise for you (dancing, walking, swimming, cycling).  DIET/WEIGHT Goal is to maintain a healthy weight  Your discharge diet is:  Diet Order             Diet regular Room service appropriate? No; Fluid consistency: Thin  Diet effective now                   liquids Your height is:  Height: 4\' 11"  (149.9 cm) Your current weight is: Weight: 82.6 kg Your Body Mass Index (BMI) is:  BMI (Calculated): 36.76 Following the type of diet specifically designed for you will help prevent another stroke. Your goal weight range is:   Your goal  Body Mass Index (BMI) is 19-24. Healthy food habits can help reduce 3 risk factors for stroke:  High cholesterol, hypertension, and excess weight.  RESOURCES Stroke/Support Group:  Call [phone]   STROKE EDUCATION PROVIDED/REVIEWED AND GIVEN TO PATIENT Stroke warning signs and symptoms How to activate emergency medical system (call 911). Medications prescribed at discharge. Need for follow-up after discharge. Personal risk factors for stroke. Pneumonia vaccine given: No Flu vaccine given: No My questions have been answered, the writing is legible, and I understand these instructions.  I will adhere to these goals & educational materials that have been provided to me after my discharge from the hospital.      My questions have been answered and I understand these instructions. I will adhere to these goals and the provided educational materials after my discharge from the hospital.  Patient/Caregiver Signature _______________________________ Date __________  Clinician Signature _______________________________________ Date __________  Please bring this form and your medication list with you to all your follow-up doctor's appointments.   Information on my medicine - ELIQUIS (apixaban)  This medication education was reviewed with me or my healthcare representative as part of my discharge preparation.  The pharmacist that spoke with me during my hospital stay was:    Why was Eliquis prescribed for you? Eliquis was prescribed for you to reduce the risk of a blood clot forming that can cause a stroke if you have a medical condition called atrial fibrillation (a type of irregular heartbeat).  What do You need to know about Eliquis ? Take your Eliquis TWICE DAILY - one tablet in the morning and one tablet in the evening with or without food. If you have difficulty swallowing the tablet whole please discuss with your pharmacist how to take the medication safely.  Take Eliquis exactly  as prescribed by your doctor and DO NOT stop taking Eliquis without talking to the doctor who prescribed the medication.  Stopping may increase your risk of developing a stroke.  Refill your prescription before you run out.  After discharge, you should have regular check-up appointments with your healthcare provider that is prescribing your Eliquis.  In the future your dose may need to be changed if your kidney function or weight changes by a significant amount or as you get older.  What do you do if you miss a dose? If you miss a dose, take it as soon as you remember on the same day and resume taking twice daily.  Do not take more than one dose of ELIQUIS at the same time to make up a missed dose.  Important Safety Information A possible side effect of Eliquis is bleeding. You  should call your healthcare provider right away if you experience any of the following: Bleeding from an injury or your nose that does not stop. Unusual colored urine (red or dark brown) or unusual colored stools (red or black). Unusual bruising for unknown reasons. A serious fall or if you hit your head (even if there is no bleeding).  Some medicines may interact with Eliquis and might increase your risk of bleeding or clotting while on Eliquis. To help avoid this, consult your healthcare provider or pharmacist prior to using any new prescription or non-prescription medications, including herbals, vitamins, non-steroidal anti-inflammatory drugs (NSAIDs) and supplements.  This website has more information on Eliquis (apixaban): http://www.eliquis.com/eliquis/home

## 2023-02-14 NOTE — Patient Care Conference (Signed)
Inpatient RehabilitationTeam Conference and Plan of Care Update Date: 02/14/2023   Time: 11:18 AM    Patient Name: Loretta Gates      Medical Record Number: [id number]  Date of Birth: 31-Mar-1936 Sex: Female         Room/Bed: 4W23C/4W23C-01 Payor Info: Payor: [employer] MEDICARE / Plan: Monia Pouch MEDICARE HMO/PPO / Product Type: *No Product type* /    Admit Date/Time:  02/13/2023  4:38 PM  Primary Diagnosis:  Left middle cerebral artery stroke [facility])  Hospital Problems: Principal Problem:   Left middle cerebral artery stroke [facility])    Expected Discharge Date: Expected Discharge Date:  (ELOS 2-3 weeks)  Team Members Present: Physician leading conference: Dr. Sula Soda Social Worker Present: Dossie Der, LCSW Nurse Present: Chana Bode, RN PT Present: Raechel Chute, PT OT Present: Bretta Bang, OT SLP Present: Other (comment) Fae Pippin, SLP) PPS Coordinator present : Fae Pippin, SLP     Current Status/Progress Goal Weekly Team Focus  Bowel/Bladder   Pt is continent of bowel/bladder   Pt will remain continent of bowel/bladder   Will assess qshift and PRN    Swallow/Nutrition/ Hydration               ADL's   Mod/max BADL   Min/supervision   Increase independence with BADL    Mobility   mod A bed mobility, transfers mod A, gait 87ft with bilateral handheld assist   CGA, min A steps  R NMR, R attention, functional transfers, gait training    Communication   Eval Pending (CIR SLP eval on 5/9)            Safety/Cognition/ Behavioral Observations  Eval Pending (CIR SLP eval on 5/9)            Pain   Pt currently denies pain   Pt will remain pain free   Will assess qshift and PRN    Skin   Pt's skin is intact   Pt's skin will remain intact  Will assess qshift and PRN      Discharge Planning:  New evaluation home with husband and daughter who works but may take time off will need to confirm and await evaualtions   Team Discussion: Patient post  left MCA CVA with right neglect and in-coordination.   Patient on target to meet rehab goals: yes, currently needs mod - max assist overall, mod assist for transfers and able to ambulate up to 48" with bilateral HHA. Goals for discharge set for CGA overall and min assist for steps.  *See Care Plan and progress notes for long and short-term goals.   Revisions to Treatment Plan:  [facility]   Teaching Needs: Safety, medications, transfers, toileting, dietary modifications, etc.   Current Barriers to Discharge: Decreased caregiver support  Possible Resolutions to Barriers: Family education Follow up services pending DME pending     Medical Summary Current Status: obesity, prediabetes with hyperglycemia, low protein, suboptimal magnesium, left MCA CVA, atrial fibrillation  Barriers to Discharge: Medical stability  Barriers to Discharge Comments: obesity, prediabetes with hyperglycemia, low protein, suboptimal magnesium, HTN, left MCA CVA, atrial fibrillation Possible Resolutions to [employer] Focus: provide dietary education, discontinued CBGs, discontinued insulin, continue to monitor BP TID, B vitamins and D vitamin started, continue Brillinta, continue lopressor   Continued Need for Acute Rehabilitation Level of Care: The patient requires daily medical management by a physician with specialized training in physical medicine and rehabilitation for the following reasons: Direction of a multidisciplinary physical rehabilitation program to  maximize functional independence : Yes Medical management of patient stability for increased activity during participation in an intensive rehabilitation regime.: Yes Analysis of laboratory values and/or radiology reports with any subsequent need for medication adjustment and/or medical intervention. : Yes   I attest that I was present, lead the team conference, and concur with the assessment and plan of the team.   Chana Bode B 02/14/2023,  2:57 PM

## 2023-02-14 NOTE — Progress Notes (Signed)
Inpatient Rehabilitation Care Coordinator Assessment and Plan Patient Details  Name: Loretta Gates MRN: [id number] Date of Birth: Mar 03, 1936  Today's Date: 02/14/2023  Hospital Problems: Principal Problem:   Left middle cerebral artery stroke [facility])  Past Medical History:  Past Medical History:  Diagnosis Date   Anemia    Bladder cancer [facility])    urologist--- dr pace   Chronic diastolic (congestive) heart failure (HCC)    followed by dr Jens Som   Chronic venous insufficiency    Edema of both lower extremities    per pt wears compression hose   GERD (gastroesophageal reflux disease)    History of cancer chemotherapy    completed 2008 for left breast cancer   History of head and neck radiation 1946   per pt approx 1946 or 78  (age 23) due to profound bilateral hearing loss told from scarlett fever/ measles,  once weekly for several weeks had  head/ neck radiation,  hearing was restored without neededing hearing aids   History of left breast cancer 2008   malignant neoplasm of overlapping sites of left breast, ER+;  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 patel;   2006  s/p left thyroidectomy w/ right inferior parathyoidectomy  (per path speciman marked parathyroid but only thyroid tissue)   Hypertension    IDA (iron deficiency anemia)    hematology/ oncologist--- dr ennever/ sarah carter NP;  treated w/ IV iron infusions   Multinodular thyroid    Neuropathy    mild hands/ feet, uses cane   Nocturia more than twice per night    Nocturnal leg cramps    OA (osteoarthritis)    knees   OAB (overactive bladder)    Osteoporosis    PAF (paroxysmal atrial fibrillation) [facility])    cardiologist--- dr Jens Som   PONV (postoperative nausea and vomiting)    Vitamin D deficiency    Wears glasses    Past Surgical History:  Past Surgical History:   Procedure Laterality Date   BREAST LUMPECTOMY WITH RADIOACTIVE SEED LOCALIZATION Right 10/25/2021   Procedure: RIGHT BREAST LUMPECTOMY WITH RADIOACTIVE SEED LOCALIZATION;  Surgeon: Harriette Bouillon, MD;  Location: [facility];  Service: General;  Laterality: Right;   CARDIOVERSION N/A 01/01/2023   Procedure: CARDIOVERSION;  Surgeon: [name], MD;  Location: [facility]COPY;  Service: Cardiovascular;  Laterality: N/A;   CHOLECYSTECTOMY, LAPAROSCOPIC  02/25/2017   @[facility]    COLONOSCOPY WITH ESOPHAGOGASTRODUODENOSCOPY (EGD)  2022   IR ANGIO INTRA EXTRACRAN SEL COM CAROTID INNOMINATE UNI R MOD SED  02/07/2023   IR ANGIO VERTEBRAL SEL SUBCLAVIAN INNOMINATE UNI L MOD SED  02/07/2023   IR CT HEAD LTD  02/07/2023   IR PERCUTANEOUS ART THROMBECTOMY/INFUSION INTRACRANIAL INC DIAG ANGIO  02/07/2023   MASTECTOMY WITH AXILLARY LYMPH NODE DISSECTION Left 03/05/2007   @ [facility]   OVARIAN CYST SURGERY     age 17;   abdominal   RADIOLOGY WITH ANESTHESIA N/A 02/06/2023   Procedure: IR WITH ANESTHESIA;  Surgeon: Radiologist, Medication, MD;  Location: [facility] OR;  Service: Radiology;  Laterality: N/A;   THYROIDECTOMY, PARTIAL Left 2006   left lobectomy and right infertior parathyroidectomy   TONSILLECTOMY     child   Social History:  reports that she has never smoked. She has never used smokeless tobacco. She reports that she does not drink alcohol and does not use  drugs.  Family / Support Systems Marital Status: Married Patient Roles: Spouse, Parent Spouse/Significant Other: Jonny Ruiz [phone] HOH Children: [name]-daughter [phone]  Son and daughter in-law in [place] Other Supports: Friends and church members Anticipated Caregiver: Daughter and husband Ability/Limitations of Caregiver: husband is in good health but [facility] and daughter can take time of from work to assist Caregiver Availability: 24/7 Family Dynamics: Very close knit family who pull together when one is in need. Husband and daughter plan  to be here daily to provide support to pt and see her progress. Pt has good supports via church members and friends  Social History Preferred language: English Religion:  Cultural Background: No issues Education: HS Health Literacy - How often do you need to have someone help you when you read instructions, pamphlets, or other written material from your doctor or pharmacy?: Never Writes: Yes Employment Status: Retired Marine scientist Issues: No issues Guardian/Conservator: None-according to MD pt is questionable regarding making her own decisions while here, will look toward her husband and daughter who come together if any decisions need to be made while here   Abuse/Neglect Abuse/Neglect Assessment Can Be Completed: Yes Physical Abuse: Denies Verbal Abuse: Denies Sexual Abuse: Denies Exploitation of patient/patient's resources: Denies Self-Neglect: Denies  Patient response to: Social Isolation - How often do you feel lonely or isolated from those around you?: Never  Emotional Status Pt's affect, behavior and adjustment status: Pt is motivated to do well she has been very active and independent prior to this happening. She, husband and daughter travel much and enjoy their adventures together. Daughter keeps her parents young. Recent Psychosocial Issues: other health issues-recent bladder surgery Psychiatric History: No history seems to be coping appropriatley and able to verbalize her concerns and feelings. May benefit from seeing neuro-psych while here will let her get used rehab first and ask team's input Substance Abuse History: No issues  Patient / Family Perceptions, Expectations & Goals Pt/Family understanding of illness & functional limitations: Pt, husband and daughter can explain her stroke and deficits from this. They all do talk with the MD and feel they have a good understanding of her plan moving forward. All glad to be here on rehab and to make a lot of  progress while here Premorbid pt/family roles/activities: wife, mom, grandmother, church member, friend, neighbor, etc Anticipated changes in roles/activities/participation: resume Pt/family expectations/goals: Pt states: " I hope to do well here."  Daughter states: " We are hopeful and she is a hard worker if can do it she will."  [employer]: None Premorbid Home Care/DME Agencies: Other (Comment) (cane, wc, rw, grab bars) Transportation available at discharge: self and daughter Is the patient able to respond to transportation needs?: Yes In the past 12 months, has lack of transportation kept you from medical appointments or from getting medications?: No In the past 12 months, has lack of transportation kept you from meetings, work, or from getting things needed for daily living?: No Resource referrals recommended: Neuropsychology  Discharge Planning Living Arrangements: Spouse/significant other, Children Support Systems: Spouse/significant other, Children, Other relatives, Friends/neighbors, Church/faith community Type of Residence: Private residence Insurance Resources: Media planner (specify) Administrator Medicare) Financial Resources: Restaurant manager, fast food Screen Referred: No Living Expenses: Lives with family Money Management: Patient, Spouse, Family Does the patient have any problems obtaining your medications?: No Home Management: all contribute to home management Patient/Family Preliminary Plans: Return home with husband and daughter, daughter does work outside of the home but can take some time off  to assist. Aware being evaluated today anf goals being set for stay here. Will probably not have target discharge date for team conference today and will let know ELOS for stay here Care Coordinator Anticipated Follow Up Needs: HH/OP  Clinical Impression Pleasant female who is motivated to do well and recover from her stroke. She ws very active prior to  admission and traveled much with husband and daughter on adventures. Husband and daughter are present and very supportive and involved. Will work on discharge needs and provide support while here.  Lucy Chris 02/14/2023, 10:48 AM

## 2023-02-14 NOTE — Progress Notes (Signed)
Inpatient Rehabilitation Admission Medication Review by a Pharmacist  A complete drug regimen review was completed for this patient to identify any potential clinically significant medication issues.  High Risk Drug Classes Is patient taking? Indication by Medication  Antipsychotic Yes Seroquel- sleep  Anticoagulant Yes Lovenox- vte ppx  Antibiotic No   Opioid No   Antiplatelet Yes Asa, ticagrelor- cva ppx  Hypoglycemics/insulin Yes Insulin, jardiance- T2DM  Vasoactive Medication Yes Lopressor- HTN  Chemotherapy No   Other Yes Sensipar- primary hyperparathyroidism s/p right inferior parathyoidectomy (partial removal) 2006 Questran- HLD Ditropan- urinary incontinence Protonix- GERD     Type of Medication Issue Identified Description of Issue Recommendation(s)  Drug Interaction(s) (clinically significant)     Duplicate Therapy     Allergy     No Medication Administration End Date     Incorrect Dose     Additional Drug Therapy Needed     Significant med changes from prior encounter (inform family/care partners about these prior to discharge).    Other  PTA meds: Eliquis (Dx: AF) Prolia Lasix Avapro Vitamin D Eliquis- plans to restart after SAH/ICH resolves in 7-10 days. CT prior to restart  Restart PTA meds when and if necessary during CIR admission or at time of discharge, if warranted      Clinically significant medication issues were identified that warrant physician communication and completion of prescribed/recommended actions by midnight of the next day:  No   Time spent performing this drug regimen review (minutes):  30   Genessis Flanary BS, PharmD, BCPS Clinical Pharmacist 02/14/2023 7:14 AM  Contact: [phone] after 3 PM  "Be curious, not judgmental..." -Debbora Dus

## 2023-02-14 NOTE — Evaluation (Signed)
Physical Therapy Assessment and Plan  Patient Details  Name: VADNA MONTIGNY MRN: [id number] Date of Birth: 1936/04/21  PT Diagnosis: Abnormal posture, Abnormality of gait, Cognitive deficits, Difficulty walking, Hemiplegia non-dominant, Impaired cognition, Impaired sensation, and Muscle weakness Rehab Potential: Fair ELOS: 2-3 weeks   Today's Date: 02/14/2023 PT Individual Time: 0830-0930 PT Individual Time Calculation (min): 60 min    Hospital Problem: Principal Problem:   Left middle cerebral artery stroke [facility])   Past Medical History:  Past Medical History:  Diagnosis Date   Anemia    Bladder cancer [facility])    urologist--- dr pace   Chronic diastolic (congestive) heart failure (HCC)    followed by dr Jens Som   Chronic venous insufficiency    Edema of both lower extremities    per pt wears compression hose   GERD (gastroesophageal reflux disease)    History of cancer chemotherapy    completed 2008 for left breast cancer   History of head and neck radiation 1946   per pt approx 1946 or 13  (age 87) due to profound bilateral hearing loss told from scarlett fever/ measles,  once weekly for several weeks had  head/ neck radiation,  hearing was restored without neededing hearing aids   History of left breast cancer 2008   malignant neoplasm of overlapping sites of left breast, ER+;  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 patel;   2006  s/p left thyroidectomy w/ right inferior parathyoidectomy  (per path speciman marked parathyroid but only thyroid tissue)   Hypertension    IDA (iron deficiency anemia)    hematology/ oncologist--- dr ennever/ sarah carter NP;  treated w/ IV iron infusions   Multinodular thyroid    Neuropathy    mild hands/ feet, uses cane   Nocturia more than twice per night    Nocturnal leg cramps    OA (osteoarthritis)     knees   OAB (overactive bladder)    Osteoporosis    PAF (paroxysmal atrial fibrillation) [facility])    cardiologist--- dr Jens Som   PONV (postoperative nausea and vomiting)    Vitamin D deficiency    Wears glasses    Past Surgical History:  Past Surgical History:  Procedure Laterality Date   BREAST LUMPECTOMY WITH RADIOACTIVE SEED LOCALIZATION Right 10/25/2021   Procedure: RIGHT BREAST LUMPECTOMY WITH RADIOACTIVE SEED LOCALIZATION;  Surgeon: Harriette Bouillon, MD;  Location: [facility];  Service: General;  Laterality: Right;   CARDIOVERSION N/A 01/01/2023   Procedure: CARDIOVERSION;  Surgeon: [name], MD;  Location: [facility] ENDOSCOPY;  Service: Cardiovascular;  Laterality: N/A;   CHOLECYSTECTOMY, LAPAROSCOPIC  02/25/2017   @[facility]    COLONOSCOPY WITH ESOPHAGOGASTRODUODENOSCOPY (EGD)  2022   IR ANGIO INTRA EXTRACRAN SEL COM CAROTID INNOMINATE UNI R MOD SED  02/07/2023   IR ANGIO VERTEBRAL SEL SUBCLAVIAN INNOMINATE UNI L MOD SED  02/07/2023   IR CT HEAD LTD  02/07/2023   IR PERCUTANEOUS ART THROMBECTOMY/INFUSION INTRACRANIAL INC DIAG ANGIO  02/07/2023   MASTECTOMY WITH AXILLARY LYMPH NODE DISSECTION Left 03/05/2007   @ [facility]   OVARIAN CYST SURGERY     age 44;   abdominal   RADIOLOGY WITH ANESTHESIA N/A 02/06/2023   Procedure: IR WITH ANESTHESIA;  Surgeon: Radiologist, Medication, MD;  Location: [facility] OR;  Service: Radiology;  Laterality: N/A;   THYROIDECTOMY, PARTIAL Left 2006   left lobectomy  and right infertior parathyroidectomy   TONSILLECTOMY     child    Assessment & Plan Clinical Impression: Patient is a 87 year old Left-handed female with history of atrial fibrillation on Eliquis with history of failed cardioversion, hypertension, bladder cancer status post TURBT 02/05/2023, diastolic congestive heart failure, chronic venous insufficiency, left breast cancer status postchemotherapy mastectomy, obesity with BMI 37.16, remote head and neck radiation 1940s age 18, left thyroidectomy  and right inferior parathyroidectomy with chronic anemia. Per chart review lives with spouse. Uses a straight point cane prior to admission. Presented 02/06/2023 with intermittent transient left eye vision loss. Cranial CT scan showed acute subarachnoid hemorrhage in the left sylvian fissure and over the left convexity. MRA/MRI showed acute left MCA territory infarction involving large portions of the left parietal and temporal lobes with additional small acute infarcts also present in the left cerebellum and left occipital lobe as well as redemonstrating subarachnoid hemorrhage and left sylvian fissure as well as left ICA occlusion. Interventional radiology follow-up for nonocclusive clot with carotid stenting. Neurology follow-up maintained on aspirin and Brilinta 90 mg twice daily plan to resume Eliquis after SAH/ICH resolved in 7 to 10 days. She was cleared to begin Lovenox for DVT prophylaxis. Tolerating a regular diet. Therapy evaluations completed due to patient decreased functional mobility was admitted for a comprehensive rehab program. Patient transferred to CIR on 02/13/2023 .   Patient currently requires mod with mobility secondary to muscle weakness and muscle joint tightness, decreased cardiorespiratoy endurance, unbalanced muscle activation, motor apraxia, decreased coordination, and decreased motor planning, decreased visual acuity, decreased visual perceptual skills, decreased visual motor skills, and field cut, decreased attention to right and decreased motor planning, decreased initiation, decreased attention, decreased awareness, decreased problem solving, decreased safety awareness, decreased memory, and delayed processing, and decreased sitting balance, decreased standing balance, decreased postural control, hemiplegia, and decreased balance strategies.  Prior to hospitalization, patient was modified independent  with mobility and lived with Spouse in a House home.  Home access is 3-4Stairs to  enter.  Patient will benefit from skilled PT intervention to maximize safe functional mobility, minimize fall risk, and decrease caregiver burden for planned discharge home with 24 hour assist.  Anticipate patient will benefit from follow up [facility] at discharge.  PT - End of Session Activity Tolerance: Tolerates < 10 min activity, no significant change in vital signs Endurance Deficit: Yes PT Assessment Rehab Potential (ACUTE/IP ONLY): Fair PT Barriers to Discharge: Decreased caregiver support;Home environment access/layout;Insurance for SNF coverage;Weight;Behavior PT Patient demonstrates impairments in the following area(s): Balance;Endurance;Motor;Perception;Safety;Sensory;Skin Integrity PT Transfers Functional Problem(s): Bed Mobility;Bed to Chair;Car PT Locomotion Functional Problem(s): Ambulation;Stairs;Wheelchair Mobility PT Plan PT Intensity: Minimum of 1-2 x/day ,45 to 90 minutes PT Frequency: 5 out of 7 days PT Duration Estimated Length of Stay: 2-3 weeks PT Treatment/Interventions: Ambulation/gait training;Functional mobility training;Discharge planning;Psychosocial support;Therapeutic Activities;Visual/perceptual remediation/compensation;Wheelchair propulsion/positioning;Therapeutic Exercise;Skin care/wound management;Neuromuscular re-education;Disease management/prevention;Balance/vestibular training;Cognitive remediation/compensation;DME/adaptive equipment instruction;Pain management;Splinting/orthotics;UE/LE Strength taining/ROM;UE/LE Coordination activities;Stair training;Patient/family education;Functional electrical stimulation;Community reintegration PT Transfers Anticipated Outcome(s): CGA PT Locomotion Anticipated Outcome(s): CGA PT Recommendation Recommendations for Other Services: Neuropsych consult;Therapeutic Recreation consult Therapeutic Recreation Interventions: Stress management Follow Up Recommendations: Home health PT;Outpatient PT;24 hour  supervision/assistance Patient destination: Home Equipment Recommended: To be determined   PT Evaluation Precautions/Restrictions Precautions Precautions: Fall Precaution Comments: R hemi Restrictions Weight Bearing Restrictions: No Pain Interference Pain Interference Pain Effect on Sleep: 0. Does not apply - I have not had any pain or hurting in the past 5 days Pain Interference with Therapy Activities: 0.  Does not apply - I have not received rehabilitationtherapy in the past 5 days Pain Interference with Day-to-Day Activities: 1. Rarely or not at all Home Living/Prior Functioning Home Living Available Help at Discharge: Family;Available 24 hours/day Type of Home: House Home Access: Stairs to enter [employer] of Steps: 3-4 Entrance Stairs-Rails: Right;Left (can't reach both) Home Layout: Able to live on main level with bedroom/bathroom;Laundry or work area in basement [employer] Shower/Tub: Forensic scientist: Pharmacist, community: Yes  Lives With: Spouse Prior Function Level of Independence: Requires assistive device for independence  Able to Take Stairs?: Yes Driving: Yes Vocation: Retired Optometrist - History Ability to See in Adequate Light: 1 Impaired Vision - Assessment Eye Alignment: Impaired (comment) Ocular Range of Motion: Restricted on the right Alignment/Gaze Preference: Gaze left;Head turned Tracking/Visual Pursuits: Decreased smoothness of horizontal tracking;Decreased smoothness of vertical tracking;Impaired - to be further tested in functional context Saccades: Impaired - to be further tested in functional context Convergence: Impaired - to be further tested in functional context Perception Perception: Impaired Inattention/Neglect: Does not attend to right visual field;Does not attend to right side of body Praxis Praxis: Impaired Praxis Impairment Details: Motor planning;Initiation   Cognition Overall Cognitive Status: Impaired/Different from baseline Arousal/Alertness: Awake/alert Orientation Level: Oriented X4 Year: 2024 Month: May Day of Week: Incorrect Attention: Sustained;Focused Focused Attention: Appears intact Sustained Attention: Impaired Sustained Attention Impairment: Verbal basic;Functional basic Memory: Impaired Memory Impairment: Decreased recall of new information;Retrieval deficit;Decreased short term memory Decreased Short Term Memory: Verbal basic;Functional basic Awareness: Impaired Awareness Impairment: Intellectual impairment Problem Solving: Impaired Problem Solving Impairment: Verbal basic;Functional basic Executive Function: Reasoning;Self Monitoring;Self Correcting Reasoning: Impaired Reasoning Impairment: Verbal basic;Functional basic Self Monitoring: Impaired Self Monitoring Impairment: Verbal basic;Functional basic Self Correcting: Impaired Self Correcting Impairment: Verbal basic;Functional basic Behaviors: Restless Safety/Judgment: Impaired Comments: decreased insight and awareness Sensation Sensation Light Touch: Impaired Detail Peripheral sensation comments: Able to discern light touch but diminished compared to her L side Light Touch Impaired Details: Impaired RUE;Impaired RLE Hot/Cold: Appears Intact Proprioception: Impaired Detail Proprioception Impaired Details: Impaired RUE;Impaired RLE Stereognosis: Not tested;Impaired by gross assessment Coordination Gross Motor Movements are Fluid and Coordinated: No Coordination and Movement Description: R hemi, R inattention Heel Shin Test: Slowed on R Motor  Motor Motor: Hemiplegia;Abnormal postural alignment and control   Trunk/Postural Assessment  Cervical Assessment Cervical Assessment: Exceptions to [facility] (forward head) Thoracic Assessment Thoracic Assessment: Exceptions to [facility] (rounded shoulders) Lumbar Assessment Lumbar Assessment: Exceptions to [facility] (posterior  tilt) Postural Control Postural Control: Deficits on evaluation Trunk Control: posterior lean in standing. R trunk rotated posteriorly in standing Righting Reactions: delayed and insufficient  Balance Balance Balance Assessed: Yes Standardized Balance Assessment Standardized Balance Assessment: Berg Balance Test Berg Balance Test Sit to Stand: Needs moderate or maximal assist to stand Standing Unsupported: Unable to stand 30 seconds unassisted Sitting with Back Unsupported but Feet Supported on Floor or Stool: Able to sit 2 minutes under supervision Stand to Sit: Needs assistance to sit Transfers: Needs one person to assist Standing Unsupported with Eyes Closed: Needs help to keep from falling Standing Ubsupported with Feet Together: Needs help to attain position and unable to hold for 15 seconds From Standing, Reach Forward with Outstretched Arm: Loses balance while trying/requires external support From Standing Position, Pick up Object from Floor: Unable to try/needs assist to keep balance From Standing Position, Turn to Look Behind Over each Shoulder: Needs assist to keep from losing balance and falling Turn 360 Degrees: Needs assistance while turning  Standing Unsupported, Alternately Place Feet on Step/Stool: Needs assistance to keep from falling or unable to try Standing Unsupported, One Foot in Front: Loses balance while stepping or standing Standing on One Leg: Unable to try or needs assist to prevent fall Total Score: 4 Static Sitting Balance Static Sitting - Balance Support: Feet supported;No upper extremity supported Static Sitting - Level of Assistance: 5: Stand by assistance Dynamic Sitting Balance Dynamic Sitting - Balance Support: Feet supported;No upper extremity supported Dynamic Sitting - Level of Assistance: 4: Min Oncologist Standing - Balance Support: No upper extremity supported Static Standing - Level of Assistance: 4: Min  assist Dynamic Standing Balance Dynamic Standing - Balance Support: No upper extremity supported;During functional activity Dynamic Standing - Level of Assistance: 3: Mod assist Extremity Assessment      RLE Assessment RLE Assessment: Exceptions to [facility] RLE Strength RLE Overall Strength: Deficits Right Hip Flexion: 2/5 Right Hip Extension: 2/5 Right Hip ABduction: 2/5 Right Hip ADduction: 2/5 Right Knee Flexion: 2/5 Right Knee Extension: 3-/5 Right Ankle Dorsiflexion: 3-/5 LLE Assessment LLE Assessment: Exceptions to [facility] General Strength Comments: Grossly 4-/5  Care Tool Care Tool Bed Mobility Roll left and right activity   Roll left and right assist level: Minimal Assistance - Patient > 75%    Sit to lying activity   Sit to lying assist level: Moderate Assistance - Patient 50 - 74%    Lying to sitting on side of bed activity   Lying to sitting on side of bed assist level: the ability to move from lying on the back to sitting on the side of the bed with no back support.: Moderate Assistance - Patient 50 - 74%     Care Tool Transfers Sit to stand transfer   Sit to stand assist level: Minimal Assistance - Patient > 75%    Chair/bed transfer   Chair/bed transfer assist level: Moderate Assistance - Patient 50 - 74%     Psychologist, clinical transfer assist level: Moderate Assistance - Patient 50 - 74%      Care Tool Locomotion Ambulation   Assist level: 2 helpers Assistive device: Hand held assist Max distance: 32ft  Walk 10 feet activity   Assist level: 2 helpers Assistive device: Hand held assist   Walk 50 feet with 2 turns activity Walk 50 feet with 2 turns activity did not occur: Safety/medical concerns      Walk 150 feet activity Walk 150 feet activity did not occur: Safety/medical concerns      Walk 10 feet on uneven surfaces activity Walk 10 feet on uneven surfaces activity did not occur: Safety/medical concerns      Stairs Stair  activity did not occur: Safety/medical concerns        Walk up/down 1 step activity Walk up/down 1 step or curb (drop down) activity did not occur: Safety/medical concerns      Walk up/down 4 steps activity Walk up/down 4 steps activity did not occur: Safety/medical concerns      Walk up/down 12 steps activity Walk up/down 12 steps activity did not occur: Safety/medical concerns      Pick up small objects from floor Pick up small object from the floor (from standing position) activity did not occur: Safety/medical concerns      Wheelchair Is the patient using a wheelchair?: Yes Type of Wheelchair: Manual   Wheelchair assist level: Dependent - Patient 0%  Wheel 50 feet with 2 turns activity   Assist Level: Dependent - Patient 0%  Wheel 150 feet activity   Assist Level: Dependent - Patient 0%    Refer to Care Plan for Long Term Goals  SHORT TERM GOAL WEEK 1 PT Short Term Goal 1 (Week 1): Pt will complete bed mobility with minA PT Short Term Goal 2 (Week 1): Pt will complete bed<>chair transfers with minA and LRAD PT Short Term Goal 3 (Week 1): Pt will ambulate 48ft with minA and LRAD PT Short Term Goal 4 (Week 1): Pt will initiate stair training  Recommendations for other services: Neuropsych and Therapeutic Recreation  Stress management  Skilled Therapeutic Intervention Mobility Bed Mobility Bed Mobility: Supine to Sit;Sit to Supine Supine to Sit: Moderate Assistance - Patient 50-74% Sit to Supine: Moderate Assistance - Patient 50-74% Transfers Transfers: Sit to Stand;Stand to Sit;Stand Pivot Transfers Sit to Stand: Minimal Assistance - Patient > 75% Stand to Sit: Minimal Assistance - Patient > 75% Stand Pivot Transfers: Moderate Assistance - Patient 50 - 74% Stand Pivot Transfer Details: Visual cues/gestures for sequencing;Verbal cues for sequencing;Verbal cues for technique;Verbal cues for precautions/safety;Tactile cues for posture;Tactile cues for weight  shifting;Manual facilitation for weight shifting;Verbal cues for gait pattern;Manual facilitation for weight bearing;Tactile cues for initiation;Tactile cues for placement Transfer (Assistive device): 1 person hand held assist Locomotion  Gait Ambulation: Yes Gait Assistance: 2 Helpers;Minimal Assistance - Patient > 75% Gait Distance (Feet): 48 Feet Assistive device: 2 person hand held assist Gait Assistance Details: Tactile cues for initiation;Tactile cues for sequencing;Tactile cues for weight shifting;Tactile cues for posture;Verbal cues for precautions/safety;Verbal cues for safe use of DME/AE;Verbal cues for technique;Manual facilitation for weight shifting;Verbal cues for sequencing;Visual cues/gestures for sequencing;Visual cues/gestures for precautions/safety;Manual facilitation for weight bearing Gait Gait: Yes Gait Pattern: Impaired Gait Pattern: Step-through pattern;Decreased stride length;Decreased dorsiflexion - right;Decreased hip/knee flexion - right;Lateral trunk lean to right;Trunk flexed;Trunk rotated posteriorly on right;Poor foot clearance - right;Right flexed knee in stance Stairs / Additional Locomotion Stairs: No Wheelchair Mobility Wheelchair Mobility: No  Skilled Intervention: Pt sitting EOB to start with her daughter present. Patient agreeable to PT evaluation. Daughter assisted with PLOF and social factors - patient A&Ox4 but lacks insight into deficits and full understanding of hospitalization. Assisted with dressing at EOB - maxA for both UB/LB for time management. Initiated functional mobility as outlined above - she is primarily limited by R sided weakness, R sensory impairments, R inattention, visual impairments, and cognitive deficits. Session concluded with patient sitting in wheelchair with 1/2 lap tray for RUE - NT made aware of need for safety belt alarm and safety plan updated.   Instructed pt in results of PT evaluation as detailed above, PT POC, rehab  potential, rehab goals, and discharge recommendations. Additionally discussed CIR's policies regarding fall safety and use of chair alarm and/or quick release belt. Pt verbalized understanding and in agreement. Will update pt's family members as they become available.   Discharge Criteria: Patient will be discharged from PT if patient refuses treatment 3 consecutive times without medical reason, if treatment goals not met, if there is a change in medical status, if patient makes no progress towards goals or if patient is discharged from hospital.  The above assessment, treatment plan, treatment alternatives and goals were discussed and mutually agreed upon: by patient  Orrin Brigham  PT, DPT, CSRS  02/14/2023, 9:32 AM

## 2023-02-14 NOTE — Progress Notes (Signed)
Occupational Therapy Session Note  Patient Details  Name: Loretta Gates MRN: [id number] Date of Birth: 01-Feb-1936  Today's Date: 02/14/2023 OT Individual Time: 1400-1515 OT Individual Time Calculation (min): 75 min    Short Term Goals: Week 1:  OT Short Term Goal 1 (Week 1): Pt will complete toilet transfer at ambulation level with min assist. OT Short Term Goal 2 (Week 1): Pt will complete donning/doffing pants and brief/underwear with min assist and mod cues to attend to right side of body. OT Short Term Goal 3 (Week 1): Pt will donn/doff shirt with min assist and min cues to attend to right side of body. OT Short Term Goal 4 (Week 1): Pt will bathe UB/LB using shower bench with min assist and min cues.  Skilled Therapeutic Interventions/Progress Updates:  Pt toileting in bathroom using stedy and also with IV attached when OT arrived.  Direct hand off to OT with second person assist for IV pole management.  Pt needing pants and brief changed due to incontinence of urine noted.  Pt required max assist due to stedy and IV present to change pants and brief.  Pt washed hands with max cues to attend to right side of sink to turn faucet on, retrieve soap, and active reaching and placement of right hand under water.  Pt transferred to w/c from stedy and transported to ortho gym where she participated in right visual scanning and active reach using RUE to tap blue dot set at largest size.  Pt needed max Vcs and intermittent visual cues to scan and locate blue dots when on right side.  Pt also needed manual support at right wrist to place into extension for functional positioning.  Pt then participated in grasping cones placed in right visual field using RUE.  Pt often reaching with forearm pronated and no active finger use although able to complete with active assisted hand over hand.  Pt then completed stand pivot blocked practice w/c <> EOM with hand held assist and min assist.  Pt transported back to  room and completed stand pivot to recliner with min assist and hand held.  Call bell in reach, seat belt alarm on.    Therapy Documentation Precautions:  Precautions Precautions: Fall Precaution Comments: R hemi Restrictions Weight Bearing Restrictions: No   Therapy/Group: Individual Therapy  Amie Critchley 02/14/2023, 3:23 PM

## 2023-02-14 NOTE — Progress Notes (Signed)
Attempted to bladder scan patient after void since no documentation since noon. Patient refused to be bladder scanned. Educated patient on importance of monitoring for urinary retention. Ruses to be bladder scanned.

## 2023-02-14 NOTE — Progress Notes (Signed)
Patient stated several times this AM that she does not like [facility], bur her sons wife works for [employer]. Encouraged patient to talk to her Child psychotherapist.

## 2023-02-14 NOTE — Progress Notes (Signed)
PROGRESS NOTE   Subjective/Complaints: Had a negative experience with nursing yesterday, discussed with head of nursing Daughter asks whether insulin is necessary, discussed that it is not at current CBGs  ROS: +low mood after yesterday's negative experience  Objective:   No results found. Recent Labs    02/13/23 1804 02/14/23 0500  WBC 8.8 6.6  HGB 14.4 13.7  HCT 44.7 43.5  PLT 259 228   Recent Labs    02/13/23 1804 02/14/23 0500  NA  --  136  K  --  4.2  CL  --  105  CO2  --  21*  GLUCOSE  --  106*  BUN  --  16  CREATININE 0.82 0.71  CALCIUM  --  9.7    Intake/Output Summary (Last 24 hours) at 02/14/2023 1144 Last data filed at 02/14/2023 0829 Gross per 24 hour  Intake 477 ml  Output --  Net 477 ml        Physical Exam: Vital Signs Blood pressure 114/85, pulse 63, temperature 98.6 F (37 C), resp. rate 19, height 4\' 11"  (1.499 m), weight 79 kg, SpO2 95 %. Constitutional:      General: She is not in acute distress.    Appearance: She is obese. She is ill-appearing.     Comments: Pt sitting up in bedside chair; awake, alert, husband holding her hand as they watch TV, NAD  HENT:     Head: Normocephalic and atraumatic.     Comments: Mild R facial droop Tongue midline R face sensation is absent    Right Ear: External ear normal.     Left Ear: External ear normal.     Nose: Nose normal. No congestion.     Mouth/Throat:     Mouth: Mucous membranes are dry.     Pharynx: Oropharynx is clear. No oropharyngeal exudate.  Eyes:     General:        Right eye: No discharge.        Left eye: No discharge.     Comments: L gaze preference- R visual field appears reduced on exam  Cardiovascular:     Rate and Rhythm: Normal rate. Rhythm irregular.     Heart sounds: Normal heart sounds. No murmur heard.    No gallop.  Pulmonary:     Effort: Pulmonary effort is normal. No respiratory distress.     Breath  sounds: Normal breath sounds. No wheezing, rhonchi or rales.  Abdominal:     General: Bowel sounds are normal. There is no distension.     Palpations: Abdomen is soft.     Tenderness: There is no abdominal tenderness.  Musculoskeletal:     Cervical back: Neck supple. No tenderness.     Comments: LUE 5/5 in Biceps, triceps, WE, grip and FA RUE- biceps 3/5; Triceps 3/5; WE 2/5; grip 2/5; and FA 2-/5 LLE- 5/5 in HF, KE, KF, DF and PF RLE- HF 2-/5; KE/KF 3-/5; DF 3-/5 and PF 3/5  Skin:    General: Skin is warm and dry.     Comments: RUE IV's x2- look OK No skin breakdown seen  Neurological:     Mental Status: She is  alert.     Comments: Patient is alert.  Noted right-sided neglect.  Provides name and age.  Follows simple commands. Decreased sensation to light touch on RUE/RLE R sided neglect noted L gaze preference  Psychiatric:     Comments: Flat, somewhat interactive    Assessment/Plan: 1. Functional deficits which require 3+ hours per day of interdisciplinary therapy in a comprehensive inpatient rehab setting. Physiatrist is providing close team supervision and 24 hour management of active medical problems listed below. Physiatrist and rehab team continue to assess barriers to discharge/monitor patient progress toward functional and medical goals  Care Tool:  Bathing              Bathing assist       Upper Body Dressing/Undressing Upper body dressing        Upper body assist      Lower Body Dressing/Undressing Lower body dressing            Lower body assist       Toileting Toileting    Toileting assist       Transfers Chair/bed transfer  Transfers assist     Chair/bed transfer assist level: Moderate Assistance - Patient 50 - 74%     Locomotion Ambulation   Ambulation assist      Assist level: 2 helpers Assistive device: Hand held assist Max distance: 25ft   Walk 10 feet activity   Assist     Assist level: 2 helpers Assistive  device: Hand held assist   Walk 50 feet activity   Assist Walk 50 feet with 2 turns activity did not occur: Safety/medical concerns         Walk 150 feet activity   Assist Walk 150 feet activity did not occur: Safety/medical concerns         Walk 10 feet on uneven surface  activity   Assist Walk 10 feet on uneven surfaces activity did not occur: Safety/medical concerns         Wheelchair     Assist Is the patient using a wheelchair?: Yes Type of Wheelchair: Manual    Wheelchair assist level: Dependent - Patient 0%      Wheelchair 50 feet with 2 turns activity    Assist        Assist Level: Dependent - Patient 0%   Wheelchair 150 feet activity     Assist      Assist Level: Dependent - Patient 0%   Blood pressure 114/85, pulse 63, temperature 98.6 F (37 C), resp. rate 19, height 4\' 11"  (1.499 m), weight 79 kg, SpO2 95 %.    Medical Problem List and Plan: 1. 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             -patient may  shower             -ELOS/Goals: 17-24 days min A to supervision  -Initial CIR therapies today 2.  Antithrombotics: -DVT/anticoagulation:  Pharmaceutical: Heparin             -antiplatelet therapy: Aspirin 81 mg daily and Brilinta 90 mg twice daily.  May resume Eliquis after SAH/ICH resolved in 7 to 10 days. 3. Pain Management: Tylenol as needed 4. Mood/Behavior/Sleep: Provide emotional support             -antipsychotic agents: Seroquel 25 mg nightly as needed 5. Neuropsych/cognition: This patient is? capable of making decisions on her  own behalf. 6. Skin/Wound Care: Routine skin checks 7. Fluids/Electrolytes/Nutrition: Routine in and outs with follow-up chemistries 8.  Atrial fibrillation with history of failed cardioversion.  Lopressor 75 mg twice daily.  Eliquis may be resumed after SAH/ICH resolved 7 to 10 days 9.  GERD.   Protonix 10.  Diastolic congestive heart failure.  Monitor for any signs of fluid overload- suggest daily weights.  11.  History of left breast cancer with chemotherapy and mastectomy.  Follow-up outpatient 12.  Morbid obesity.  BMI 37.16.  Dietary follow-up 13.  Chronic anemia.  Follow-up CBC  14. Prediabetes: d/ced CBGs and insulin and discussed that insulin is not recommended for her current CBG levels, provided dietary education, discussed that we will get a fasting blood glucose every Monday with her routine BMP  15. Suboptimal magnesium: ordered 1 gram IV on 5/8  >50 minutes spent in discussion of negative experience she had with nursing yesterday, reached out to head of nursing to discuss with patient, discontinued CBGs and discussed that insulin is not recommended for her current CBG levels, provided dietary education, discussed that we will get a fasting blood glucose every Monday with her routine BMP, discussed ordering Ig IV magnesium as goal magnesium level is 2 and current level is 1.9    LOS: 1 days A FACE TO FACE EVALUATION WAS PERFORMED  Clint Bolder P Keshawn Sundberg 02/14/2023, 11:44 AM

## 2023-02-14 NOTE — Progress Notes (Signed)
[facility] Individual Statement of Services  Patient Name:  Loretta Gates  Date:  02/14/2023  Welcome to the [facility].  Our goal is to provide you with an individualized program based on your diagnosis and situation, designed to meet your specific needs.  With this comprehensive rehabilitation program, you will be expected to participate in at least 3 hours of rehabilitation therapies Monday-Friday, with modified therapy programming on the weekends.  Your rehabilitation program will include the following services:  Physical Therapy (PT), Occupational Therapy (OT), Speech Therapy (ST), 24 hour per day rehabilitation nursing, Therapeutic Recreaction (TR), Neuropsychology, Care Coordinator, Rehabilitation Medicine, Nutrition Services, and Pharmacy Services  Weekly team conferences will be held on Wednesday to discuss your progress.  Your Inpatient Rehabilitation Care Coordinator will talk with you frequently to get your input and to update you on team discussions.  Team conferences with you and your family in attendance may also be held.  Expected length of stay: 2-3 weeks  Overall anticipated outcome: CGA-Min level of assist  Depending on your progress and recovery, your program may change. Your Inpatient Rehabilitation Care Coordinator will coordinate services and will keep you informed of any changes. Your Inpatient Rehabilitation Care Coordinator's name and contact numbers are listed  below.  The following services may also be recommended but are not provided by the [facility]:  Driving Evaluations Home Health Rehabiltiation Services Outpatient Rehabilitation Services    Arrangements will be made to provide these services after discharge if needed.  Arrangements include referral to agencies that provide these services.  Your insurance has been verified to be:  [employer] Your primary doctor is:  Warner Mccreedy  Pertinent information will be shared with your doctor and your insurance company.  Inpatient Rehabilitation Care Coordinator:  Dossie Der, [place] [phone] or Luna [place]  Information discussed with and copy given to patient by: Lucy Chris, 02/14/2023, 10:50 AM

## 2023-02-14 NOTE — Progress Notes (Signed)
Patients daughter is anxious and aggressive towards staff. Patient had a Lovenox shot at 10 am this morning with no bleeding. Upon doing report with [place] Nurse patient daughter was yelling and aggressive, stating you are a poor nurse. Tried to explain it was a blood thinner and she was not previously bleeding. Patient was put back to bed at approximately 630. Patient refused her dinner since 515, then when patient was placed back in bed 10 minutes later patient wanted her dinner. It was explained that we were unable to get her back up to her chair at that time. Patient still requested her dinner. Then daughter was yelling regarding her mother having a straw, there are no current order for no straws. Assured patients daughter that I would no longer take care of her mother.

## 2023-02-14 NOTE — Evaluation (Signed)
Occupational Therapy Assessment and Plan  Patient Details  Name: Loretta Gates MRN: [id number] Date of Birth: 1936-07-01  OT Diagnosis: abnormal posture, apraxia, cognitive deficits, disturbance of vision, hemiplegia affecting non-dominant side, and muscle weakness (generalized) Rehab Potential: Rehab Potential (ACUTE ONLY): Good ELOS: 2-3 weeks   Today's Date: 02/14/2023 OT Individual Time: 7829-5621 OT Individual Time Calculation (min): 60 min     Hospital Problem: Principal Problem:   Left middle cerebral artery stroke [facility])   Past Medical History:  Past Medical History:  Diagnosis Date   Anemia    Bladder cancer [facility])    urologist--- dr pace   Chronic diastolic (congestive) heart failure (HCC)    followed by dr Jens Som   Chronic venous insufficiency    Edema of both lower extremities    per pt wears compression hose   GERD (gastroesophageal reflux disease)    History of cancer chemotherapy    completed 2008 for left breast cancer   History of head and neck radiation 1946   per pt approx 1946 or 33  (age 40) due to profound bilateral hearing loss told from scarlett fever/ measles,  once weekly for several weeks had  head/ neck radiation,  hearing was restored without neededing hearing aids   History of left breast cancer 2008   malignant neoplasm of overlapping sites of left breast, ER+;  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 patel;   2006  s/p left thyroidectomy w/ right inferior parathyoidectomy  (per path speciman marked parathyroid but only thyroid tissue)   Hypertension    IDA (iron deficiency anemia)    hematology/ oncologist--- dr ennever/ sarah carter NP;  treated w/ IV iron infusions   Multinodular thyroid    Neuropathy    mild hands/ feet, uses cane   Nocturia more than twice per night    Nocturnal leg cramps    OA  (osteoarthritis)    knees   OAB (overactive bladder)    Osteoporosis    PAF (paroxysmal atrial fibrillation) [facility])    cardiologist--- dr Jens Som   PONV (postoperative nausea and vomiting)    Vitamin D deficiency    Wears glasses    Past Surgical History:  Past Surgical History:  Procedure Laterality Date   BREAST LUMPECTOMY WITH RADIOACTIVE SEED LOCALIZATION Right 10/25/2021   Procedure: RIGHT BREAST LUMPECTOMY WITH RADIOACTIVE SEED LOCALIZATION;  Surgeon: Harriette Bouillon, MD;  Location: [facility];  Service: General;  Laterality: Right;   CARDIOVERSION N/A 01/01/2023   Procedure: CARDIOVERSION;  Surgeon: [name], MD;  Location: [facility] ENDOSCOPY;  Service: Cardiovascular;  Laterality: N/A;   CHOLECYSTECTOMY, LAPAROSCOPIC  02/25/2017   @[facility]    COLONOSCOPY WITH ESOPHAGOGASTRODUODENOSCOPY (EGD)  2022   IR ANGIO INTRA EXTRACRAN SEL COM CAROTID INNOMINATE UNI R MOD SED  02/07/2023   IR ANGIO VERTEBRAL SEL SUBCLAVIAN INNOMINATE UNI L MOD SED  02/07/2023   IR CT HEAD LTD  02/07/2023   IR PERCUTANEOUS ART THROMBECTOMY/INFUSION INTRACRANIAL INC DIAG ANGIO  02/07/2023   MASTECTOMY WITH AXILLARY LYMPH NODE DISSECTION Left 03/05/2007   @ [facility]   OVARIAN CYST SURGERY     age 87;   abdominal   RADIOLOGY WITH ANESTHESIA N/A 02/06/2023   Procedure: IR WITH ANESTHESIA;  Surgeon: Radiologist, Medication, MD;  Location: [facility] OR;  Service: Radiology;  Laterality: N/A;   THYROIDECTOMY, PARTIAL Left 2006  left lobectomy and right infertior parathyroidectomy   TONSILLECTOMY     child    Assessment & Plan Clinical Impression: Terrencia Engel is a 87 year old Left-handed female with history of atrial fibrillation on Eliquis with history of failed cardioversion, hypertension, bladder cancer status post TURBT 02/05/2023, diastolic congestive heart failure, chronic venous insufficiency, left breast cancer status postchemotherapy mastectomy, obesity with BMI 37.16, remote head and neck radiation  1940s age 87, left thyroidectomy and right inferior parathyroidectomy with chronic anemia. Per chart review lives with spouse. Uses a straight point cane prior to admission. Presented 02/06/2023 with intermittent transient left eye vision loss. Cranial CT scan showed acute subarachnoid hemorrhage in the left sylvian fissure and over the left convexity. MRA/MRI showed acute left MCA territory infarction involving large portions of the left parietal and temporal lobes with additional small acute infarcts also present in the left cerebellum and left occipital lobe as well as redemonstrating subarachnoid hemorrhage and left sylvian fissure as well as left ICA occlusion. Interventional radiology follow-up for nonocclusive clot with carotid stenting. Neurology follow-up maintained on aspirin and Brilinta 90 mg twice daily plan to resume Eliquis after SAH/ICH resolved in 7 to 10 days. She was cleared to begin Lovenox for DVT prophylaxis. Tolerating a regular diet. Therapy evaluations completed due to patient decreased functional mobility was admitted for a comprehensive rehab program.  Patient transferred to CIR on 02/13/2023 .    Patient currently requires mod with basic self-care skills secondary to muscle weakness, decreased cardiorespiratoy endurance, impaired timing and sequencing, motor apraxia, decreased coordination, and decreased motor planning, decreased visual perceptual skills, decreased visual motor skills, and field cut, decreased midline orientation, decreased attention to right, and right side neglect, decreased initiation, decreased attention, decreased awareness, decreased problem solving, decreased safety awareness, decreased memory, and delayed processing, and decreased sitting balance, decreased standing balance, decreased postural control, hemiplegia, and decreased balance strategies.  Prior to hospitalization, patient could complete all ADLs and IADLs with modified independent .  Patient will benefit  from skilled intervention to decrease level of assist with basic self-care skills prior to discharge home with care partner.  Anticipate patient will require 24 hour supervision and minimal physical assistance and follow up home health and follow up outpatient.  OT - End of Session Activity Tolerance: Tolerates 30+ min activity with multiple rests Endurance Deficit: Yes Endurance Deficit Description: Required seated rest breaks throughout basic sinkside ADLs and toileting. OT Assessment Rehab Potential (ACUTE ONLY): Good OT Barriers to Discharge: Behavior OT Barriers to Discharge Comments: Slighlty impulsive and restless during initial evaluation placing pt at higher risk for falls.  Pt also exhibits impairments in awareness of own deficits and at times stated she doesnt need therapy. OT Patient demonstrates impairments in the following area(s): Balance;Sensory;Behavior;Skin Integrity;Cognition;Vision;Endurance;Motor;Perception;Safety OT Basic ADL's Functional Problem(s): Eating;Grooming;Bathing;Dressing;Toileting OT Advanced ADL's Functional Problem(s): Simple Meal Preparation;Light Housekeeping OT Transfers Functional Problem(s): Toilet;Tub/Shower OT Additional Impairment(s): Fuctional Use of Upper Extremity OT Plan OT Intensity: Minimum of 1-2 x/day, 45 to 90 minutes OT Frequency: 5 out of 7 days OT Duration/Estimated Length of Stay: 2-3 weeks OT Treatment/Interventions: Balance/vestibular training;Disease mangement/prevention;Neuromuscular re-education;Self Care/advanced ADL retraining;Therapeutic Exercise;Wheelchair propulsion/positioning;Cognitive remediation/compensation;DME/adaptive equipment instruction;Pain management;Skin care/wound managment;UE/LE Strength taining/ROM;Community reintegration;Functional electrical stimulation;Patient/family education;UE/LE Coordination activities;Splinting/orthotics;Discharge planning;Functional mobility training;Psychosocial support;Therapeutic  Activities;Visual/perceptual remediation/compensation OT Self Feeding Anticipated Outcome(s): supervision OT Basic Self-Care Anticipated Outcome(s): CGA-supervision OT Toileting Anticipated Outcome(s): supervision OT Bathroom Transfers Anticipated Outcome(s): CGA OT Recommendation Patient destination: Home Follow Up Recommendations: Home health OT;Outpatient OT Equipment Recommended: To be  determined Equipment Details: Already owns standard w/c (not sure if dimensions are correct), shower chair, RW, and Rollator   OT Evaluation Precautions/Restrictions  Precautions Precautions: Fall Precaution Comments: R hemi Restrictions Weight Bearing Restrictions: No General Chart Reviewed: Yes Pain Pain Assessment Pain Scale: 0-10 Pain Score: 0-No pain Home Living/Prior Functioning Home Living Family/patient expects to be discharged to:: Private residence Living Arrangements: Spouse/significant other, Children Available Help at Discharge: Family, Available 24 hours/day Type of Home: House Home Access: Stairs to enter [employer] of Steps: 3-4 Entrance Stairs-Rails: Right, Left Home Layout: Able to live on main level with bedroom/bathroom, Laundry or work area in basement [employer] Shower/Tub: Hydrographic surveyor, Engineer, building services: Pharmacist, community: Yes  Lives With: Spouse IADL History Homemaking Responsibilities: Yes Meal Prep Responsibility: Careers adviser Responsibility: Primary Cleaning Responsibility: Primary Current License: Yes Mode of Transportation: Set designer Occupation: Retired Prior Function Level of Independence: Independent with basic ADLs, Independent with transfers, Independent with homemaking with ambulation  Able to Take Stairs?: Yes Driving: Yes Vocation: Retired Administrator, sports Baseline Vision/History: 1 Wears glasses Ability to See in Adequate Light: 1 Impaired Patient Visual Report: Peripheral vision impairment ("I cant see out of the right  as well") Vision Assessment?: Yes Eye Alignment: Impaired (comment) Ocular Range of Motion: Restricted on the right Alignment/Gaze Preference: Gaze left;Head turned Tracking/Visual Pursuits: Decreased smoothness of horizontal tracking;Decreased smoothness of vertical tracking;Requires cues, head turns, or add eye shifts to track;Unable to hold eye position out of midline Saccades: Impaired - to be further tested in functional context Convergence: Impaired - to be further tested in functional context Visual Fields: Right visual field deficit Perception  Perception: Impaired Inattention/Neglect: Does not attend to right visual field;Does not attend to right side of body Praxis Praxis: Impaired Praxis Impairment Details: Motor planning;Initiation Cognition Cognition Overall Cognitive Status: Impaired/Different from baseline Arousal/Alertness: Awake/alert Orientation Level: Person;Place;Situation Person: Oriented Place: Oriented Situation: Oriented Memory: Impaired Memory Impairment: Decreased recall of new information;Retrieval deficit;Decreased short term memory Decreased Short Term Memory: Verbal basic;Functional basic Attention: Sustained;Focused Focused Attention: Appears intact Sustained Attention: Impaired Sustained Attention Impairment: Verbal basic;Functional basic Awareness: Impaired Awareness Impairment: Intellectual impairment Problem Solving: Impaired Problem Solving Impairment: Verbal basic;Functional basic Executive Function: Reasoning;Self Monitoring;Self Correcting;Sequencing;Decision Making;Initiating Reasoning: Impaired Reasoning Impairment: Verbal basic;Functional basic Sequencing: Impaired Sequencing Impairment: Verbal basic;Functional basic Decision Making: Impaired Decision Making Impairment: Verbal basic;Functional basic Initiating: Impaired Initiating Impairment: Verbal basic;Functional basic Self Monitoring: Impaired Self Monitoring Impairment: Verbal  basic;Functional basic Self Correcting: Impaired Self Correcting Impairment: Verbal basic;Functional basic Behaviors: Restless;Impulsive Safety/Judgment: Impaired Comments: decreased insight and awareness Brief Interview for Mental Status (BIMS) Repetition of Three Words (First Attempt): 3 Temporal Orientation: Year: Correct Temporal Orientation: Month: Accurate within 5 days Temporal Orientation: Day: Correct Recall: "Sock": Yes, no cue required Recall: "Blue": Yes, no cue required Recall: "Bed": Yes, no cue required BIMS Summary Score: 15 Sensation Sensation Light Touch: Impaired Detail Peripheral sensation comments: Able to discern light touch but diminished compared to her L side Light Touch Impaired Details: Impaired RUE;Impaired RLE Hot/Cold: Appears Intact Proprioception: Impaired Detail Proprioception Impaired Details: Impaired RUE;Impaired RLE Stereognosis: Not tested;Impaired by gross assessment Coordination Gross Motor Movements are Fluid and Coordinated: No Fine Motor Movements are Fluid and Coordinated: No Coordination and Movement Description: R hemi, R inattention Finger Nose Finger Test: Unable RUE due to inattention and impaired coordination; WFL LUE Motor  Motor Motor: Hemiplegia;Abnormal postural alignment and control  Trunk/Postural Assessment  Cervical Assessment Cervical Assessment: Exceptions to [employer] (forward head; left rotation; downward gaze)  Thoracic Assessment Thoracic Assessment: Exceptions to [facility] (rounded shoulders; right slightly depressed) Lumbar Assessment Lumbar Assessment: Exceptions to [facility] (posterior pelvic tilt) Postural Control Postural Control: Deficits on evaluation Trunk Control: posterior and right lateral lean Righting Reactions: delayed and insufficient  Balance Balance Balance Assessed: Yes Standardized Balance Assessment Standardized Balance Assessment: Berg Balance Test Berg Balance Test Sit to Stand: Needs moderate or  maximal assist to stand Standing Unsupported: Unable to stand 30 seconds unassisted Sitting with Back Unsupported but Feet Supported on Floor or Stool: Able to sit 2 minutes under supervision Stand to Sit: Needs assistance to sit Transfers: Needs one person to assist Standing Unsupported with Eyes Closed: Needs help to keep from falling Standing Ubsupported with Feet Together: Needs help to attain position and unable to hold for 15 seconds From Standing, Reach Forward with Outstretched Arm: Loses balance while trying/requires external support From Standing Position, Pick up Object from Floor: Unable to try/needs assist to keep balance From Standing Position, Turn to Look Behind Over each Shoulder: Needs assist to keep from losing balance and falling Turn 360 Degrees: Needs assistance while turning Standing Unsupported, Alternately Place Feet on Step/Stool: Needs assistance to keep from falling or unable to try Standing Unsupported, One Foot in Front: Loses balance while stepping or standing Standing on One Leg: Unable to try or needs assist to prevent fall Total Score: 4 Static Sitting Balance Static Sitting - Balance Support: Feet supported;No upper extremity supported Static Sitting - Level of Assistance: 5: Stand by assistance Dynamic Sitting Balance Dynamic Sitting - Balance Support: Feet supported;No upper extremity supported Dynamic Sitting - Level of Assistance: 4: Min Oncologist Standing - Balance Support: No upper extremity supported Static Standing - Level of Assistance: 4: Min assist Dynamic Standing Balance Dynamic Standing - Balance Support: No upper extremity supported;During functional activity Dynamic Standing - Level of Assistance: 3: Mod assist Extremity/Trunk Assessment RUE Assessment RUE Assessment: Exceptions to [facility] RUE Body System: Neuro Brunstrum levels for arm and hand: Arm;Hand Brunstrum level for arm: Stage IV Movement is deviating  from synergy Brunstrum level for hand: Stage IV Movements deviating from synergies LUE Assessment LUE Assessment: Within Functional Limits  Care Tool Care Tool Self Care Eating   Eating Assist Level: Minimal Assistance - Patient > 75%    Oral Care    Oral Care Assist Level: Minimal Assistance - Patient > 75%    Bathing         Assist Level: Moderate Assistance - Patient 50 - 74%    Upper Body Dressing(including orthotics)       Assist Level: Moderate Assistance - Patient 50 - 74%    Lower Body Dressing (excluding footwear)     Assist for lower body dressing: Moderate Assistance - Patient 50 - 74%    Putting on/Taking off footwear     Assist for footwear: Moderate Assistance - Patient 50 - 74%       Care Tool Toileting Toileting activity   Assist for toileting: Moderate Assistance - Patient 50 - 74%     Care Tool Bed Mobility Roll left and right activity   Roll left and right assist level: Minimal Assistance - Patient > 75%    Sit to lying activity   Sit to lying assist level: Moderate Assistance - Patient 50 - 74%    Lying to sitting on side of bed activity   Lying to sitting on side of bed assist level: the ability to move from lying on the  back to sitting on the side of the bed with no back support.: Moderate Assistance - Patient 50 - 74%     Care Tool Transfers Sit to stand transfer   Sit to stand assist level: Minimal Assistance - Patient > 75%    Chair/bed transfer   Chair/bed transfer assist level: Moderate Assistance - Patient 50 - 74%     Toilet transfer   Assist Level: Minimal Assistance - Patient > 75%     Care Tool Cognition  Expression of Ideas and Wants    Understanding Verbal and Non-Verbal Content     Memory/Recall Ability     Refer to Care Plan for Long Term Goals  SHORT TERM GOAL WEEK 1 OT Short Term Goal 1 (Week 1): Pt will complete toilet transfer at ambulation level with min assist. OT Short Term Goal 2 (Week 1): Pt will  complete donning/doffing pants and brief/underwear with min assist and mod cues to attend to right side of body. OT Short Term Goal 3 (Week 1): Pt will donn/doff shirt with min assist and min cues to attend to right side of body. OT Short Term Goal 4 (Week 1): Pt will bathe UB/LB using shower bench with min assist and min cues.  Recommendations for other services: None    Skilled Therapeutic Intervention ADL ADL Eating: Unable to assess Grooming: Maximal cueing;Moderate assistance Where Assessed-Grooming: Standing at sink Upper Body Bathing: Moderate assistance Where Assessed-Upper Body Bathing: Sitting at sink Lower Body Bathing: Moderate assistance Where Assessed-Lower Body Bathing: Sitting at sink;Standing at sink Upper Body Dressing: Moderate assistance;Maximal cueing Where Assessed-Upper Body Dressing: Sitting at sink Lower Body Dressing: Maximal cueing;Moderate assistance Where Assessed-Lower Body Dressing: Sitting at sink;Standing at sink Toileting: Moderate assistance Where Assessed-Toileting: Teacher, adult education: Curator Method: Surveyor, minerals: Engineer, technical sales: Unable to assess Tub/Shower Transfer Method: Unable to assess Film/video editor: Unable to assess [employer] Method: Unable to assess Mobility  Bed Mobility Bed Mobility: Not assessed Supine to Sit: Moderate Assistance - Patient 50-74% Sit to Supine: Moderate Assistance - Patient 50-74% Transfers Sit to Stand: Minimal Assistance - Patient > 75% Stand to Sit: Minimal Assistance - Patient > 75%  Skilled Intervention: Pt sitting up in w/c at OT arrival, husband and daughter present.  Pt appearing restless and moving out of seat slowly without awareness.  Educated pt and family regarding OT scope of practice, frequency of therapy, and collaborated with pt and family on OT POC.  Initial evaluation completed and patient required above  levels of assist to complete sinkside ADLS and toileting in bathroom.  Pt required visual cues in left visual field then slowly bringing pts attention to right visual field to attend to right side of environment due to pt unable to attend with verbal cues only.  Pt left up in w/c, call bell in reach, and seat belt alarm on at end of session.    Discharge Criteria: Patient will be discharged from OT if patient refuses treatment 3 consecutive times without medical reason, if treatment goals not met, if there is a change in medical status, if patient makes no progress towards goals or if patient is discharged from hospital.  The above assessment, treatment plan, treatment alternatives and goals were discussed and mutually agreed upon: by patient and by family  Amie Critchley 02/14/2023, 12:49 PM

## 2023-02-14 NOTE — Plan of Care (Signed)
  Problem: RH Eating Goal: LTG Patient will perform eating w/assist, cues/equip (OT) Description: LTG: Patient will perform eating with assist, with/without cues using equipment (OT) Flowsheets (Taken 02/14/2023 1315) LTG: Pt will perform eating with assistance level of: Supervision/Verbal cueing   Problem: RH Grooming Goal: LTG Patient will perform grooming w/assist,cues/equip (OT) Description: LTG: Patient will perform grooming with assist, with/without cues using equipment (OT) Flowsheets (Taken 02/14/2023 1315) LTG: Pt will perform grooming with assistance level of: Supervision/Verbal cueing   Problem: RH Bathing Goal: LTG Patient will bathe all body parts with assist levels (OT) Description: LTG: Patient will bathe all body parts with assist levels (OT) Flowsheets (Taken 02/14/2023 1315) LTG: Pt will perform bathing with assistance level/cueing: Contact Guard/Touching assist   Problem: RH Dressing Goal: LTG Patient will perform upper body dressing (OT) Description: LTG Patient will perform upper body dressing with assist, with/without cues (OT). Flowsheets (Taken 02/14/2023 1315) LTG: Pt will perform upper body dressing with assistance level of: Supervision/Verbal cueing Goal: LTG Patient will perform lower body dressing w/assist (OT) Description: LTG: Patient will perform lower body dressing with assist, with/without cues in positioning using equipment (OT) Flowsheets (Taken 02/14/2023 1315) LTG: Pt will perform lower body dressing with assistance level of: Minimal Assistance - Patient > 75%   Problem: RH Toileting Goal: LTG Patient will perform toileting task (3/3 steps) with assistance level (OT) Description: LTG: Patient will perform toileting task (3/3 steps) with assistance level (OT)  Flowsheets (Taken 02/14/2023 1315) LTG: Pt will perform toileting task (3/3 steps) with assistance level: Minimal Assistance - Patient > 75%   Problem: RH Functional Use of Upper Extremity Goal: LTG  Patient will use RT/LT upper extremity as a (OT) Description: LTG: Patient will use right/left upper extremity as a stabilizer/gross assist/diminished/nondominant/dominant level with assist, with/without cues during functional activity (OT) Flowsheets (Taken 02/14/2023 1315) LTG: Use of upper extremity in functional activities: RUE as gross assist level LTG: Pt will use upper extremity in functional activity with assistance level of: Supervision/Verbal cueing   Problem: RH Toilet Transfers Goal: LTG Patient will perform toilet transfers w/assist (OT) Description: LTG: Patient will perform toilet transfers with assist, with/without cues using equipment (OT) Flowsheets (Taken 02/14/2023 1315) LTG: Pt will perform toilet transfers with assistance level of: Contact Guard/Touching assist   Problem: RH Tub/Shower Transfers Goal: LTG Patient will perform tub/shower transfers w/assist (OT) Description: LTG: Patient will perform tub/shower transfers with assist, with/without cues using equipment (OT) Flowsheets (Taken 02/14/2023 1315) LTG: Pt will perform tub/shower stall transfers with assistance level of: Contact Guard/Touching assist

## 2023-02-14 NOTE — Plan of Care (Signed)
  Problem: RH Balance Goal: LTG Patient will maintain dynamic sitting balance (PT) Description: LTG:  Patient will maintain dynamic sitting balance with assistance during mobility activities (PT) Flowsheets (Taken 02/14/2023 0934) LTG: Pt will maintain dynamic sitting balance during mobility activities with:: Supervision/Verbal cueing Goal: LTG Patient will maintain dynamic standing balance (PT) Description: LTG:  Patient will maintain dynamic standing balance with assistance during mobility activities (PT) Flowsheets (Taken 02/14/2023 0934) LTG: Pt will maintain dynamic standing balance during mobility activities with:: Contact Guard/Touching assist   Problem: Sit to Stand Goal: LTG:  Patient will perform sit to stand with assistance level (PT) Description: LTG:  Patient will perform sit to stand with assistance level (PT) Flowsheets (Taken 02/14/2023 0934) LTG: PT will perform sit to stand in preparation for functional mobility with assistance level: Contact Guard/Touching assist   Problem: RH Bed Mobility Goal: LTG Patient will perform bed mobility with assist (PT) Description: LTG: Patient will perform bed mobility with assistance, with/without cues (PT). Flowsheets (Taken 02/14/2023 0934) LTG: Pt will perform bed mobility with assistance level of: Contact Guard/Touching assist   Problem: RH Bed to Chair Transfers Goal: LTG Patient will perform bed/chair transfers w/assist (PT) Description: LTG: Patient will perform bed to chair transfers with assistance (PT). Flowsheets (Taken 02/14/2023 0934) LTG: Pt will perform Bed to Chair Transfers with assistance level: Contact Guard/Touching assist   Problem: RH Car Transfers Goal: LTG Patient will perform car transfers with assist (PT) Description: LTG: Patient will perform car transfers with assistance (PT). Flowsheets (Taken 02/14/2023 0934) LTG: Pt will perform car transfers with assist:: Minimal Assistance - Patient > 75%   Problem: RH  Ambulation Goal: LTG Patient will ambulate in controlled environment (PT) Description: LTG: Patient will ambulate in a controlled environment, # of feet with assistance (PT). Flowsheets (Taken 02/14/2023 0934) LTG: Pt will ambulate in controlled environ  assist needed:: Contact Guard/Touching assist LTG: Ambulation distance in controlled environment: 143ft Goal: LTG Patient will ambulate in home environment (PT) Description: LTG: Patient will ambulate in home environment, # of feet with assistance (PT). Flowsheets (Taken 02/14/2023 0934) LTG: Pt will ambulate in home environ  assist needed:: Contact Guard/Touching assist LTG: Ambulation distance in home environment: 61ft   Problem: RH Stairs Goal: LTG Patient will ambulate up and down stairs w/assist (PT) Description: LTG: Patient will ambulate up and down # of stairs with assistance (PT) Flowsheets (Taken 02/14/2023 0934) LTG: Pt will ambulate up/down stairs assist needed:: Minimal Assistance - Patient > 75% LTG: Pt will  ambulate up and down number of stairs: 3 with 1 railing as per home setup

## 2023-02-14 NOTE — Progress Notes (Signed)
Current plan is to continue aspirin 81 mg daily and Brilinta 45 mg twice daily for CVA prophylaxis.  Plan to resume Eliquis after SAH/ICH resolves in 7 to 10 days per neurology services.  Patient will need head CT prior to resuming anticoagulation.

## 2023-02-14 NOTE — Progress Notes (Signed)
Inpatient Rehabilitation  Patient information reviewed and entered into eRehab system by Shermika Balthaser Kailie Polus, OTR/L, Rehab Quality Coordinator.   Information including medical coding, functional ability and quality indicators will be reviewed and updated through discharge.   

## 2023-02-15 DIAGNOSIS — I63512 Cerebral infarction due to unspecified occlusion or stenosis of left middle cerebral artery: Secondary | ICD-10-CM | POA: Diagnosis not present

## 2023-02-15 LAB — PLATELET INHIBITION P2Y12

## 2023-02-15 MED ORDER — ENSURE ENLIVE PO LIQD
237.0000 mL | Freq: Two times a day (BID) | ORAL | Status: DC
  Administered 2023-02-15 – 2023-02-24 (×9): 237 mL via ORAL

## 2023-02-15 NOTE — Evaluation (Signed)
Speech Language Pathology Assessment and Plan  Patient Details  Name: Loretta Gates MRN: [id number] Date of Birth: 05/29/1936  SLP Diagnosis: Dysphagia;Cognitive Impairments  Rehab Potential: Good ELOS: 2-3 weeks    Today's Date: 02/15/2023 SLP Individual Time: 0930-1030 SLP Individual Time Calculation (min): 60 min   Hospital Problem: Principal Problem:   Left middle cerebral artery stroke [facility])  Past Medical History:  Past Medical History:  Diagnosis Date   Anemia    Bladder cancer [facility])    urologist--- dr pace   Chronic diastolic (congestive) heart failure (HCC)    followed by dr Jens Som   Chronic venous insufficiency    Edema of both lower extremities    per pt wears compression hose   GERD (gastroesophageal reflux disease)    History of cancer chemotherapy    completed 2008 for left breast cancer   History of head and neck radiation 1946   per pt approx 1946 or 13  (age 67) due to profound bilateral hearing loss told from scarlett fever/ measles,  once weekly for several weeks had  head/ neck radiation,  hearing was restored without neededing hearing aids   History of left breast cancer 2008   malignant neoplasm of overlapping sites of left breast, ER+;  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 patel;   2006  s/p left thyroidectomy w/ right inferior parathyoidectomy  (per path speciman marked parathyroid but only thyroid tissue)   Hypertension    IDA (iron deficiency anemia)    hematology/ oncologist--- dr ennever/ sarah carter NP;  treated w/ IV iron infusions   Multinodular thyroid    Neuropathy    mild hands/ feet, uses cane   Nocturia more than twice per night    Nocturnal leg cramps    OA (osteoarthritis)    knees   OAB (overactive bladder)    Osteoporosis    PAF (paroxysmal atrial fibrillation) [facility])    cardiologist---  dr Jens Som   PONV (postoperative nausea and vomiting)    Vitamin D deficiency    Wears glasses    Past Surgical History:  Past Surgical History:  Procedure Laterality Date   BREAST LUMPECTOMY WITH RADIOACTIVE SEED LOCALIZATION Right 10/25/2021   Procedure: RIGHT BREAST LUMPECTOMY WITH RADIOACTIVE SEED LOCALIZATION;  Surgeon: Harriette Bouillon, MD;  Location: [facility];  Service: General;  Laterality: Right;   CARDIOVERSION N/A 01/01/2023   Procedure: CARDIOVERSION;  Surgeon: [name], MD;  Location: [facility];  Service: Cardiovascular;  Laterality: N/A;   CHOLECYSTECTOMY, LAPAROSCOPIC  02/25/2017   @[facility]    COLONOSCOPY WITH ESOPHAGOGASTRODUODENOSCOPY (EGD)  2022   IR ANGIO INTRA EXTRACRAN SEL COM CAROTID INNOMINATE UNI R MOD SED  02/07/2023   IR ANGIO VERTEBRAL SEL SUBCLAVIAN INNOMINATE UNI L MOD SED  02/07/2023   IR CT HEAD LTD  02/07/2023   IR PERCUTANEOUS ART THROMBECTOMY/INFUSION INTRACRANIAL INC DIAG ANGIO  02/07/2023   MASTECTOMY WITH AXILLARY LYMPH NODE DISSECTION Left 03/05/2007   @ [facility]   OVARIAN CYST SURGERY     age 22;   abdominal   RADIOLOGY WITH ANESTHESIA N/A 02/06/2023   Procedure: IR WITH ANESTHESIA;  Surgeon: Radiologist, Medication, MD;  Location: [facility] OR;  Service: Radiology;  Laterality: N/A;   THYROIDECTOMY, PARTIAL Left 2006   left lobectomy and right infertior parathyroidectomy   TONSILLECTOMY     child  Assessment / Plan / Recommendation Clinical Impression Patient is a 87 year old left-handed female with history of atrial fibrillation on Eliquis with failed cardioversion, hypertension, bladder cancer status post TURBT 02/05/2023, diastolic congestive heart failure, chronic venous insufficiency, left breast cancer status postchemotherapy mastectomy, obesity, remote head and neck radiation 1940s age 43, left thyroidectomy and right inferior parathyroidectomy with chronic anemia.  Presented 02/06/2023 with intermittent transient left eye vision  loss. Cranial CT scan showed acute subarachnoid hemorrhage in the left sylvian fissure and over the left convexity. MRA/MRI showed acute left MCA territory infarction involving large portions of the left parietal and temporal lobes with additional small acute infarcts also present in the left cerebellum and left occipital lobe as well as redemonstrating subarachnoid hemorrhage and left sylvian fissure as well as left ICA occlusion. Interventional radiology follow-up for nonocclusive clot with carotid stenting.Tolerating a regular diet. Therapy evaluations completed with recommendations for a comprehensive rehab program. Patient admitted 02/13/23.  Patient demonstrates moderate-severe cognitive deficits impacting sustained attention, initiation, orientation, intellectual awareness, functional problem solving, attention to right field of environment, and recall of daily information. Patient verbose throughout session with poor topic maintenance requiring cues for redirection. Patient requested to use the bathroom and required Max verbal cues for sequencing and problem solving throughout task. Patient's auditory comprehension and verbal expression appeared [facility] for tasks assessed.   Patient consumed a snack of regular textures and thin liquids via straw. Patient demonstrated efficient mastication and a swift swallow response without overt s/s of aspiration or residue noted. Recommend patient continue current diet of regular textures with thin liquids with full supervision. Patient's family is present 24 hours a day and can provide assistance.  Patient would benefit from skilled SLP intervention to maximize her cognitive and swallowing function prior to discharge.    Skilled Therapeutic Interventions          Administered a cognitive-linguistic evaluation and BSE, please see above for details.   SLP Assessment  Patient will need skilled Speech Lanaguage Pathology Services during CIR admission     Recommendations  SLP Diet Recommendations: Age appropriate regular solids;Thin Liquid Administration via: Cup;Straw Medication Administration: Whole meds with liquid Supervision: Patient able to self feed;Full supervision/cueing for compensatory strategies Compensations: Slow rate;Small sips/bites;Minimize environmental distractions Postural Changes and/or Swallow Maneuvers: Seated upright 90 degrees Oral Care Recommendations: Oral care BID Recommendations for Other Services: Neuropsych consult Patient destination: Home Follow up Recommendations: Home Health SLP;24 hour supervision/assistance Equipment Recommended: None recommended by SLP    SLP Frequency 1 to 3 out of 7 days   SLP Duration  SLP Intensity  SLP Treatment/Interventions 2-3 weeks  Minumum of 1-2 x/day, 30 to 90 minutes  Cognitive remediation/compensation;Cueing hierarchy;Environmental controls;Functional tasks;Internal/external aids;Patient/family education;Therapeutic Activities;Dysphagia/aspiration precaution training    Pain No/Denies Pain   SLP Evaluation Cognition Overall Cognitive Status: Impaired/Different from baseline Arousal/Alertness: Awake/alert Orientation Level: Oriented to person;Oriented to place;Disoriented to situation;Oriented to time Focused Attention: Appears intact Sustained Attention: Impaired Sustained Attention Impairment: Verbal basic;Functional basic Memory: Impaired Memory Impairment: Decreased recall of new information;Retrieval deficit;Decreased short term memory Decreased Short Term Memory: Verbal basic;Functional basic Awareness: Impaired Awareness Impairment: Intellectual impairment Problem Solving: Impaired Problem Solving Impairment: Verbal basic;Functional basic Executive Function: Reasoning;Self Monitoring;Self Correcting;Sequencing;Decision Making;Initiating Reasoning: Impaired Reasoning Impairment: Verbal basic;Functional basic Sequencing: Impaired Sequencing  Impairment: Verbal basic;Functional basic Decision Making: Impaired Decision Making Impairment: Verbal basic;Functional basic Initiating: Impaired Initiating Impairment: Verbal basic;Functional basic Self Monitoring: Impaired Self Monitoring Impairment: Verbal basic;Functional basic Self Correcting: Impaired Self Correcting Impairment: Verbal basic;Functional  basic Safety/Judgment: Impaired  Comprehension Auditory Comprehension Overall Auditory Comprehension: Appears within functional limits for tasks assessed Visual Recognition/Discrimination Discrimination: Not tested Reading Comprehension Reading Status: Not tested Expression Expression Primary Mode of Expression: Verbal Verbal Expression Overall Verbal Expression: Appears within functional limits for tasks assessed Written Expression Written Expression: Not tested Oral Motor Oral Motor/Sensory Function Overall Oral Motor/Sensory Function: Within functional limits Motor Speech Overall Motor Speech: Appears within functional limits for tasks assessed  Care Tool Care Tool Cognition Ability to hear (with hearing aid or hearing appliances if normally used Ability to hear (with hearing aid or hearing appliances if normally used): 1. Minimal difficulty - difficulty in some environments (e.g. when person speaks softly or setting is noisy)   Expression of Ideas and Wants Expression of Ideas and Wants: 3. Some difficulty - exhibits some difficulty with expressing needs and ideas (e.g, some words or finishing thoughts) or speech is not clear   Understanding Verbal and Non-Verbal Content Understanding Verbal and Non-Verbal Content: 3. Usually understands - understands most conversations, but misses some part/intent of message. Requires cues at times to understand  Memory/Recall Ability Memory/Recall Ability : That he or she is in a hospital/hospital unit;Staff names and faces   Bedside Swallowing Assessment General Date of Onset:  02/05/23 Previous Swallow Assessment: BSE, recommended regular textures with thin liquids Diet Prior to this Study: NPO Temperature Spikes Noted: No Respiratory Status: Room air History of Recent Intubation: No Behavior/Cognition: Alert;Cooperative;Pleasant mood Oral Cavity - Dentition: Adequate natural dentition Self-Feeding Abilities: Able to feed self;Needs set up;Needs assist Vision: Functional for self-feeding Patient Positioning: Upright in chair/Tumbleform Baseline Vocal Quality: Normal Volitional Cough: Weak Volitional Swallow: Able to elicit  Ice Chips No/Denies Pain  Thin Liquid Thin Liquid: Within functional limits Presentation: Straw;Self Fed;Cup Nectar Thick Nectar Thick Liquid: Not tested Honey Thick Honey Thick Liquid: Not tested Puree Puree: Within functional limits Presentation: Spoon Solid Solid: Within functional limits Presentation: Self Fed BSE Assessment Risk for Aspiration Impact on safety and function: Mild aspiration risk Other Related Risk Factors: Lethargy;Cognitive impairment  Short Term Goals: Week 1: SLP Short Term Goal 1 (Week 1): Patient will consume current diet without overt s/s of aspiration with supervision level verbal cues for use of swallowing compensatory strategies. SLP Short Term Goal 2 (Week 1): Patient will demonstrate sustained attention to functional tasks for 5 minutes with Mod verbal cues for redirection. SLP Short Term Goal 3 (Week 1): Patient will attend to right enviornment/body during functional tasks with Mod multimodal cues. SLP Short Term Goal 4 (Week 1): Patient will verbalize 2 cognitive deficits and 2 physical deficits with Mod A multimodal cues.  Refer to Care Plan for Long Term Goals  Recommendations for other services: Neuropsych  Discharge Criteria: Patient will be discharged from SLP if patient refuses treatment 3 consecutive times without medical reason, if treatment goals not met, if there is a change in  medical status, if patient makes no progress towards goals or if patient is discharged from hospital.  The above assessment, treatment plan, treatment alternatives and goals were discussed and mutually agreed upon: by patient and by family  Colden Samaras 02/15/2023, 4:24 PM

## 2023-02-15 NOTE — Progress Notes (Signed)
1910  While awaiting to receive hand off report from prior nurse patients daughter called to have the patient pulled up in bed. Nurse entered the room to assist while off going nurse was still giving report to another nurse. Patient's daughter visibly upset. Voice several concerns. Patient did not eat her dinner, she was supposed to be up in the chair for all meals. She had several question in regards to wether or not meds were given during the day. I explained to her that I had not received report nor have I had chance to view the chart. I informed her that once we got her up in the chair, and I receive report I would be able to answer he question. At this time the day shift nurse entered the room to give report,  the daughter interrupted several times. Stating that the prior nurse did a horrible job with her lovenox injection due to some bleeding noted at the site. Day nurse was attempting to inform the daughter that patient was up in the chair and refused to eat. Daughter was dismissive, even as day nurse was attempting to validate her concerns and explain why patient was in bed. Day nurse walked out.  CNA and night nurse got patient in the chair, daughter encouraged patient to eat. Daughter copmplained about the meal and the portion of meat on the sandwich "isn't enough to feed a kid".  Patient seems to get anxious and upset when daughter complaining. Patient seem reluctant to staff help without confirmation from the daughter.   A signification about of time was spent in the room answering all questions and concerns. The charge nurse was informed of the interactions as well.

## 2023-02-15 NOTE — Progress Notes (Signed)
Physical Therapy Session Note  Patient Details  Name: Loretta Gates MRN: [id number] Date of Birth: May 17, 1936  Today's Date: 02/15/2023 PT Individual Time: 0832-0902 PT Individual Time Calculation (min): 30 min   Short Term Goals: Week 1:  PT Short Term Goal 1 (Week 1): Pt will complete bed mobility with minA PT Short Term Goal 2 (Week 1): Pt will complete bed<>chair transfers with minA and LRAD PT Short Term Goal 3 (Week 1): Pt will ambulate 54ft with minA and LRAD PT Short Term Goal 4 (Week 1): Pt will initiate stair training  Skilled Therapeutic Interventions/Progress Updates:     Pt presents in room seated in armchair with family members present, eating breakfast. Pt agreeable to PT and denies pain. Session focused on self care tasks, maintaining sustained attention on task, attendance to R side, and transfer training. Pt completes sit to stand with min assist and stand pivot min assist during session.   Pt requests to get dressed, pt requires min assist for threading pants, verbal cues for sequencing and technique to attend to right side. Pt able to complete pulling pants over hips in standing with min assist for postural stability, requires two attempts with one posterior LOB lowered gently to chair with min assist during pulling pants over hips. Pt then dons shirt in sitting, easily distractible in low distractive environment with pt demonstrating tangential speech, requires verbal cues to remain on task. Pt requires min assist for doffing night shirt, mod assist for donning bra with verbal cues for managing RUE and straps, therapist assist with clasps. Pt then requires min assist for donning shirt, mod verbal cues for sequencing and attendance to R side.  Pt then completes stand pivot transfer from arm chair to WC on L, verbal cues for sequencing, HHA on RUE, min assist for task. Pt remains seated in [facility] for conversation with MD during session, continues to demonstrate distractibility  during conversation and does require cueing to remain on task. Pt requires cues for optimal positioning in sitting and then remains seated with all needs within reach, call light in place, posey belt donned and activated, and R half arm tray in position at end of session.  Therapy Documentation Precautions:  Precautions Precautions: Fall Precaution Comments: R hemi Restrictions Weight Bearing Restrictions: No   Therapy/Group: Individual Therapy  Edwin Cap PT, DPT 02/15/2023, 12:25 PM

## 2023-02-15 NOTE — Progress Notes (Signed)
PROGRESS NOTE   Subjective/Complaints: Patient is in positive mood this morning, hopeful for recovery, working with PT. She has no particular complaints, reviewed chart and daughter had several complaints yesterday evening  ROS: +mood is much brighter today  Objective:   No results found. Recent Labs    02/13/23 1804 02/14/23 0500  WBC 8.8 6.6  HGB 14.4 13.7  HCT 44.7 43.5  PLT 259 228   Recent Labs    02/13/23 1804 02/14/23 0500  NA  --  136  K  --  4.2  CL  --  105  CO2  --  21*  GLUCOSE  --  106*  BUN  --  16  CREATININE 0.82 0.71  CALCIUM  --  9.7    Intake/Output Summary (Last 24 hours) at 02/15/2023 0951 Last data filed at 02/14/2023 1303 Gross per 24 hour  Intake 337 ml  Output --  Net 337 ml        Physical Exam: Vital Signs Blood pressure 131/84, pulse 83, temperature 98.5 F (36.9 C), temperature source Oral, resp. rate 19, height 4\' 11"  (1.499 m), weight 79 kg, SpO2 94 %. Constitutional:      General: She is not in acute distress.    Appearance: She is obese. She is ill-appearing. BMI 35.18    Comments: Pt sitting up in bedside chair; awake, alert, husband holding her hand as they watch TV, NAD  HENT:     Head: Normocephalic and atraumatic.     Comments: Mild R facial droop Tongue midline R face sensation is absent    Right Ear: External ear normal.     Left Ear: External ear normal.     Nose: Nose normal. No congestion.     Mouth/Throat:     Mouth: Mucous membranes are dry.     Pharynx: Oropharynx is clear. No oropharyngeal exudate.  Eyes:     General:        Right eye: No discharge.        Left eye: No discharge.     Comments: L gaze preference- R visual field appears reduced on exam  Cardiovascular:     Rate and Rhythm: Normal rate. Rhythm irregular.     Heart sounds: Normal heart sounds. No murmur heard.    No gallop.  Pulmonary:     Effort: Pulmonary effort is normal. No  respiratory distress.     Breath sounds: Normal breath sounds. No wheezing, rhonchi or rales.  Abdominal:     General: Bowel sounds are normal. There is no distension.     Palpations: Abdomen is soft.     Tenderness: There is no abdominal tenderness.  Musculoskeletal:     Cervical back: Neck supple. No tenderness.     Comments: LUE 5/5 in Biceps, triceps, WE, grip and FA RUE- biceps 3/5; Triceps 3/5; WE 2/5; grip 2/5; and FA 2-/5 LLE- 5/5 in HF, KE, KF, DF and PF RLE- HF 2-/5; KE/KF 3-/5; DF 3-/5 and PF 3/5  Skin:    General: Skin is warm and dry.     Comments: RUE IV's x2- look OK No skin breakdown seen  Neurological:  Mental Status: She is alert.     Comments: Patient is alert.  Noted right-sided neglect.  Provides name and age.  Follows simple commands. Decreased sensation to light touch on RUE/RLE R sided neglect noted L gaze preference  Psychiatric:     Comments: Flat, somewhat interactive    Assessment/Plan: 1. Functional deficits which require 3+ hours per day of interdisciplinary therapy in a comprehensive inpatient rehab setting. Physiatrist is providing close team supervision and 24 hour management of active medical problems listed below. Physiatrist and rehab team continue to assess barriers to discharge/monitor patient progress toward functional and medical goals  Care Tool:  Bathing              Bathing assist Assist Level: Moderate Assistance - Patient 50 - 74%     Upper Body Dressing/Undressing Upper body dressing        Upper body assist Assist Level: Moderate Assistance - Patient 50 - 74%    Lower Body Dressing/Undressing Lower body dressing            Lower body assist Assist for lower body dressing: Moderate Assistance - Patient 50 - 74%     Toileting Toileting    Toileting assist Assist for toileting: Moderate Assistance - Patient 50 - 74%     Transfers Chair/bed transfer  Transfers assist     Chair/bed transfer assist  level: Moderate Assistance - Patient 50 - 74%     Locomotion Ambulation   Ambulation assist      Assist level: 2 helpers Assistive device: Hand held assist Max distance: 17ft   Walk 10 feet activity   Assist     Assist level: 2 helpers Assistive device: Hand held assist   Walk 50 feet activity   Assist Walk 50 feet with 2 turns activity did not occur: Safety/medical concerns         Walk 150 feet activity   Assist Walk 150 feet activity did not occur: Safety/medical concerns         Walk 10 feet on uneven surface  activity   Assist Walk 10 feet on uneven surfaces activity did not occur: Safety/medical concerns         Wheelchair     Assist Is the patient using a wheelchair?: Yes Type of Wheelchair: Manual    Wheelchair assist level: Dependent - Patient 0%      Wheelchair 50 feet with 2 turns activity    Assist        Assist Level: Dependent - Patient 0%   Wheelchair 150 feet activity     Assist      Assist Level: Dependent - Patient 0%   Blood pressure 131/84, pulse 83, temperature 98.5 F (36.9 C), temperature source Oral, resp. rate 19, height 4\' 11"  (1.499 m), weight 79 kg, SpO2 94 %.    Medical Problem List and Plan: 1. 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             -patient may  shower             -ELOS/Goals: 17-24 days min A to supervision  -Initial CIR therapies today 2.  Antithrombotics: -DVT/anticoagulation:  Pharmaceutical: Heparin             -antiplatelet therapy: Aspirin 81 mg daily and Brilinta 90 mg twice daily.  May resume Eliquis after SAH/ICH resolved in 7 to 10 days. 3. Pain Management:  Tylenol as needed 4. Mood/Behavior/Sleep: Provide emotional support             -antipsychotic agents: Seroquel 25 mg nightly as needed 5. Neuropsych/cognition: This patient is? capable of making decisions on her own  behalf. 6. Skin/Wound Care: Routine skin checks 7. Fluids/Electrolytes/Nutrition: Routine in and outs with follow-up chemistries 8.  Atrial fibrillation with history of failed cardioversion.  Lopressor 75 mg twice daily.  Eliquis may be resumed after SAH/ICH resolved 7 to 10 days 9.  GERD.  Protonix 10.  Diastolic congestive heart failure.  Monitor for any signs of fluid overload- suggest daily weights.  11.  History of left breast cancer with chemotherapy and mastectomy.  Follow-up outpatient  12.  Morbid obesity.  BMI 37.16-->35.18.  Dietary follow-up  13.  Chronic anemia. Hgb reviewed and has normalized  14. Prediabetes: d/ced CBGs and insulin and discussed that insulin is not recommended for her current CBG levels, provided dietary education, discussed that we will get a fasting blood glucose every Monday with her routine BMP  15. Suboptimal magnesium: ordered 1 gram IV on 5/8. IV discontinued afterward since no more IV medications warranted  16. Expressive aphasia: commended on incredible improvements already!  17. Low protein levels: edited diet order to request that more protein be give with meals  18. Bleeding from Lovenox injection site: discussed with patient that this is a possible side effect of lovenox injections and the purpose is to help prevent clots, SCDs ordered for clot prevention as well, and discussed with patient that once she is ambulating 150 feet (goal for this week is 75) we can discontinue her lovenox as this level of ambulation can help prevent clots  <50 minutes spent in review of chart, examination of patient, discussing her progress with therapy, discussed discontinuing IV since no more IV medications are warranted, Hgb reviewed and has normalized, edited diet order to request that more protein orders be given with meals, discussed bleeding from lovenox injection site with patient and nursing, discussed with patient that goal this week is to ambulate 75 feet, and  once she is ambulating 150 feet this is excellent for clot prevention so we can discontinued Lovenox injections    LOS: 2 days A FACE TO FACE EVALUATION WAS PERFORMED  Drema Pry Young Mulvey 02/15/2023, 9:51 AM

## 2023-02-15 NOTE — Progress Notes (Signed)
Patient ID: Loretta Gates, female   DOB: 1936-04-21, 87 y.o.   MRN: [id number]  Met with daughter a d pt's husband to hear their concerns regarding unit and communication here. Have informed them have asked Keesha-Asst Director to speak with them and will ask MD to order resting hand splint on and prafo boot on at night if not an order already. Will ask MD to order pt to be up in chair for her meals per daughter and husband request. Will follow up with later today

## 2023-02-15 NOTE — Plan of Care (Signed)
  Problem: RH Swallowing Goal: LTG Patient will consume least restrictive diet using compensatory strategies with assistance (SLP) Description: LTG:  Patient will consume least restrictive diet using compensatory strategies with assistance (SLP) Flowsheets (Taken 02/15/2023 1629) LTG: Pt Patient will consume least restrictive diet using compensatory strategies with assistance of (SLP): Modified Independent   Problem: RH Problem Solving Goal: LTG Patient will demonstrate problem solving for (SLP) Description: LTG:  Patient will demonstrate problem solving for basic/complex daily situations with cues  (SLP) Flowsheets (Taken 02/15/2023 1629) LTG: Patient will demonstrate problem solving for (SLP): Basic daily situations LTG Patient will demonstrate problem solving for: Minimal Assistance - Patient > 75%   Problem: RH Memory Goal: LTG Patient will use memory compensatory aids to (SLP) Description: LTG:  Patient will use memory compensatory aids to recall biographical/new, daily complex information with cues (SLP) Flowsheets (Taken 02/15/2023 1629) LTG: Patient will use memory compensatory aids to (SLP): Minimal Assistance - Patient > 75%   Problem: RH Attention Goal: LTG Patient will demonstrate this level of attention during functional activites (SLP) Description: LTG:  Patient will will demonstrate this level of attention during functional activites (SLP) Flowsheets (Taken 02/15/2023 1629) Patient will demonstrate during cognitive/linguistic activities the attention type of: Sustained LTG: Patient will demonstrate this level of attention during cognitive/linguistic activities with assistance of (SLP): Minimal Assistance - Patient > 75%   Problem: RH Awareness Goal: LTG: Patient will demonstrate awareness during functional activites type of (SLP) Description: LTG: Patient will demonstrate awareness during functional activites type of (SLP) Flowsheets (Taken 02/15/2023 1629) Patient will demonstrate  during cognitive/linguistic activities awareness type of: Intellectual LTG: Patient will demonstrate awareness during cognitive/linguistic activities with assistance of (SLP): Minimal Assistance - Patient > 75%

## 2023-02-15 NOTE — Progress Notes (Signed)
Physical Therapy Session Note  Patient Details  Name: Loretta Gates MRN: [id number] Date of Birth: 1936/01/09  Today's Date: 02/15/2023 PT Individual Time: 4098-1191 + 4782-9562 PT Individual Time Calculation (min): 45 min  + 47 min  Short Term Goals: Week 1:  PT Short Term Goal 1 (Week 1): Pt will complete bed mobility with minA PT Short Term Goal 2 (Week 1): Pt will complete bed<>chair transfers with minA and LRAD PT Short Term Goal 3 (Week 1): Pt will ambulate 32ft with minA and LRAD PT Short Term Goal 4 (Week 1): Pt will initiate stair training  Skilled Therapeutic Interventions/Progress Updates:     1st session: Direct handoff of care from SLP with patient sitting in w/c to start - no reports of pain. Transported to main rehab gym for time.  Sit<>stand to RW with minA, primairly for initiatoin. Gait training with RW ~92ft including x2 turns with minA - pt needing hand-over-hand assist for keeping RUE on RW, pt unaware when it slides off. Gait improves when assist provided for RW management. Pt highly distractable from gym environment, poor selective attention.  Worked on blocked practice stand pivot transfers using no AD. From mat table to arm chair, towards both directions. Increased difficulty transferring to her weaker R side, max cues (fading to mod) for sequencing and posture. minA overall for transfer.   Blocked practice sit<>stand transfers with bilateral HHA to promote upright posture and weightbearing through LLE - minA overall and poor postural awareness with trunk shortening on R.  Continued gait training with bilateral HHA with PT in front of her to encourage erect posture and weight bearing through LLE - ambulated ~40ft with x2 turns with min/modA using this technique. Responds well to verbal cueing but difficult to sustain corrections.,   Retrieved RW splint for RUE during gait - explained to patient the purpose and she was agreeable. Similar distance ~18ft of gait  training with minA and RW - patient reporting R wrist discomfort when turning the RW - will benefit from padding or readjustment in future sessions.  Returned to her room and daughter requesting patient to sit in an arm chair as wheelchair is too uncomfortable. Pt completed stand pivot transfer with minA to arm chair, towards her R side - assist for scooting backwards and repositioning. Seat belt alarm on, family at the bedside, and nursing present in the room.    2nd session: Pt sitting in arm chair to start with her husband sitting next to her. Pt in agreement to therapy session however appears quite fatigued. Husband asking for furniture in room to be rearranged to allow sleeper recliner be positioned next to the bed - completed as asked.   Pt transported in w/c to main rehab gym. Stand pivot transfer with minA from w/c to mat table, no AD with face-to-face technique. Assisted into supine position with large wedge supporting trunk. Supine there-ex completed: -2x10 bridges -2x10 knee fall outs/ins -2x10 heel slides -2x10 hip abd/add *AAROM required due to fatigue and poor attention. Pt with eyes closed majority of there-ex, falling in.out of sleep.   Returned to sitting with maxA and then assisted back to her w/c via minA stand pivot transfer. Wheeled back to her room due to fatigue. Offered to take her back to bed to rest but husband requesting patient stay sitting up in recliner. Short distance ambulatory transfer with minA and RW (~42ft) from w/c to recliner - assist for RW management, stability, trunk support, and cues for safety approach  to recliner. Patient required assistance for repositioning and used pillows for trunk support and R arm support. BLE elevated, husband at bedside, RN made aware of patient's position. Patient missed 13 minutes of therapy due to fatigue. Coordinated with scheduling to have PT/OT in AM due PM fatigue.   Therapy Documentation Precautions:   Precautions Precautions: Fall Precaution Comments: R hemi Restrictions Weight Bearing Restrictions: No General:      Therapy/Group: Individual Therapy  Loretta Gates  PT, DPT, CSRS  02/15/2023, 7:42 AM

## 2023-02-16 DIAGNOSIS — I63512 Cerebral infarction due to unspecified occlusion or stenosis of left middle cerebral artery: Secondary | ICD-10-CM | POA: Diagnosis not present

## 2023-02-16 LAB — HOMOCYSTEINE: Homocysteine: 11.7 umol/L (ref 0.0–21.3)

## 2023-02-16 NOTE — Progress Notes (Signed)
Occupational Therapy Session Note  Patient Details  Name: Loretta Gates MRN: [id number] Date of Birth: July 07, 1936  Today's Date: 02/16/2023 OT Individual Time: 1601-0932 OT Individual Time Calculation (min): 29 min    Short Term Goals: Week 1:  OT Short Term Goal 1 (Week 1): Pt will complete toilet transfer at ambulation level with min assist. OT Short Term Goal 2 (Week 1): Pt will complete donning/doffing pants and brief/underwear with min assist and mod cues to attend to right side of body. OT Short Term Goal 3 (Week 1): Pt will donn/doff shirt with min assist and min cues to attend to right side of body. OT Short Term Goal 4 (Week 1): Pt will bathe UB/LB using shower bench with min assist and min cues.  Skilled Therapeutic Interventions/Progress Updates: Patient received sitting up in recliner in room with husband and daughter present. Patient agreeable to OT intervention working to regain strength and function in the RUE. Patient reports having poor sensation to touch and temperature in that hand and displayed poor attention to the right side. Worked on functional reach following AAROM working on pain free shoulder ROM. Daughter reports willingness to help and encourage patient to perform UE activities during the weekend. Followed with FM retraining and in hand manipulation working to form a composite fist and individual tip pinch with maximal cues to attend to the R side. Patient willing to participate but expresses feeling  like she wanted to give up on the RUE since she does well being left handed. Continued to emphasize all the bimanual activities such as opening containers and even reading a book or playing piano. Patient reports willingness to continue focusing on RUE use between therapy sessions. Continue wit skilled OT POC.     Therapy Documentation Precautions:  Precautions Precautions: Fall Precaution Comments: R hemi Restrictions Weight Bearing Restrictions: No General:    Vital Signs: Therapy Vitals Temp: 98.1 F (36.7 C) Temp Source: Oral Pulse Rate: 88 Resp: 18 BP: 128/85 Patient Position (if appropriate): Sitting Oxygen Therapy SpO2: 97 % Pain:no c/o pain    Therapy/Group: Individual Therapy  Warnell Forester 02/16/2023, 12:33 PM

## 2023-02-16 NOTE — IPOC Note (Signed)
Overall Plan of Care [facility]) Patient Details Name: Loretta Gates MRN: [id number] DOB: September 07, 1936  Admitting Diagnosis: Left middle cerebral artery stroke [employer])  Hospital Problems: Principal Problem:   Left middle cerebral artery stroke [facility])     Functional Problem List: Nursing Bladder, Bowel, Endurance, Medication Management, Safety  PT Balance, Endurance, Motor, Perception, Safety, Sensory, Skin Integrity  OT Balance, Sensory, Behavior, Skin Integrity, Cognition, Vision, Endurance, Motor, Perception, Safety  SLP Cognition, Nutrition  TR         Basic ADL's: OT Eating, Grooming, Bathing, Dressing, Toileting     Advanced  ADL's: OT Simple Meal Preparation, Light Housekeeping     Transfers: PT Bed Mobility, Bed to Chair, Customer service manager, Tub/Shower     Locomotion: PT Ambulation, Stairs, Wheelchair Mobility     Additional Impairments: OT Fuctional Use of Upper Extremity  SLP Swallowing, Social Cognition   Social Interaction, Problem Solving, Memory, Awareness, Attention  TR      Anticipated Outcomes Item Anticipated Outcome  Self Feeding supervision  Swallowing  Mod I   Basic self-care  CGA-supervision  Toileting  supervision   Bathroom Transfers CGA  Bowel/Bladder  manage bowel w mod I and bladder w toileting assist  Transfers  CGA  Locomotion  CGA  Communication     Cognition  Min A  Pain  n/a  Safety/Judgment  manage w cues   Therapy Plan: PT Intensity: Minimum of 1-2 x/day ,45 to 90 minutes PT Frequency: 5 out of 7 days PT Duration Estimated Length of Stay: 2-3 weeks OT Intensity: Minimum of 1-2 x/day, 45 to 90 minutes OT Frequency: 5 out of 7 days OT Duration/Estimated Length of Stay: 2-3 weeks SLP Intensity: Minumum of 1-2 x/day, 30 to 90 minutes SLP Frequency: 1 to 3 out of 7 days SLP Duration/Estimated Length of Stay: 2-3 weeks   Team Interventions: Nursing Interventions Patient/Family Education, Bowel Management, Bladder Management,  Disease Management/Prevention, Medication Management, Discharge Planning  PT interventions Ambulation/gait training, Functional mobility training, Discharge planning, Psychosocial support, Therapeutic Activities, Visual/perceptual remediation/compensation, Wheelchair propulsion/positioning, Therapeutic Exercise, Skin care/wound management, Neuromuscular re-education, Disease management/prevention, Metallurgist training, Cognitive remediation/compensation, DME/adaptive equipment instruction, Pain management, Splinting/orthotics, UE/LE Strength taining/ROM, UE/LE Coordination activities, Stair training, Patient/family education, Functional electrical stimulation, Community reintegration  OT Interventions Warden/ranger, Disease mangement/prevention, Neuromuscular re-education, Self Care/advanced ADL retraining, Therapeutic Exercise, Wheelchair propulsion/positioning, Cognitive remediation/compensation, DME/adaptive equipment instruction, Pain management, Skin care/wound managment, UE/LE Strength taining/ROM, Community reintegration, Development worker, international aid stimulation, Patient/family education, UE/LE Coordination activities, Splinting/orthotics, Discharge planning, Functional mobility training, Psychosocial support, Therapeutic Activities, Visual/perceptual remediation/compensation  SLP Interventions Cognitive remediation/compensation, Cueing hierarchy, Environmental controls, Functional tasks, Internal/external aids, Patient/family education, Therapeutic Activities, Dysphagia/aspiration precaution training  TR Interventions    SW/CM Interventions Discharge Planning, Psychosocial Support, Patient/Family Education   Barriers to Discharge MD  Medical stability  Nursing Decreased caregiver support, Home environment access/layout 2 level 4ste bil rails main B+B w spouse; dtr to take time off work to assist; has DME  PT Decreased caregiver support, Home environment Best boy, Insurance for  SNF coverage, Weight, Behavior    OT Behavior Slighlty impulsive and restless during initial evaluation placing pt at higher risk for falls.  Pt also exhibits impairments in awareness of own deficits and at times stated she doesnt need therapy.  SLP Nutrition means    SW       Team Discharge Planning: Destination: PT-Home ,OT- Home , SLP-Home Projected Follow-up: PT-Home health PT, Outpatient PT, 24 hour supervision/assistance, OT-  Home health OT,  Outpatient OT, SLP-Home Health SLP, 24 hour supervision/assistance Projected Equipment Needs: PT-To be determined, OT- To be determined, SLP-None recommended by SLP Equipment Details: PT- , OT-Already owns standard w/c (not sure if dimensions are correct), shower chair, RW, and Rollator Patient/family involved in discharge planning: PT- Patient, Family member/caregiver,  OT-Family Adult nurse, Patient, SLP-Patient, Family member/caregiver  MD ELOS: 17-24 days Medical Rehab Prognosis:  Excellent Assessment: The patient has been admitted for CIR therapies with the diagnosis of left MCA CVA. The team will be addressing functional mobility, strength, stamina, balance, safety, adaptive techniques and equipment, self-care, bowel and bladder mgt, patient and caregiver education. Goals have been set at supervision to [facility]. Anticipated discharge destination is home.         See Team Conference Notes for weekly updates to the plan of care

## 2023-02-16 NOTE — Progress Notes (Signed)
Physical Therapy Session Note  Patient Details  Name: Loretta Gates MRN: [id number] Date of Birth: Mar 07, 1936  Today's Date: 02/16/2023 PT Individual Time: 7829-5621 PT Individual Time Calculation (min): 72 min   Short Term Goals: Week 1:  PT Short Term Goal 1 (Week 1): Pt will complete bed mobility with minA PT Short Term Goal 2 (Week 1): Pt will complete bed<>chair transfers with minA and LRAD PT Short Term Goal 3 (Week 1): Pt will ambulate 24ft with minA and LRAD PT Short Term Goal 4 (Week 1): Pt will initiate stair training  Skilled Therapeutic Interventions/Progress Updates:      Pt sitting in recliner to start - agreeable to therapy session. No reports of pain during treatment. Focused session on initiated stair training and continued NMR for her R side.   Transported to main rehab gym. Used 3" steps and 2 hand rails to facilitate stair training. Patient completed alternating foot taps to 3" step with minA with no knee buckling observed - able to fully clear foot. Patient practiced navigating x8, 3" steps with minA using 2 hand rails. Assist needed at times for guiding her RUE along rail due to R inattention. Assist also needed primarily for trunk support as patient is able to navigate legs without assist.   Patient transported to day room rehab gym and she participated in the Dance Group at both wheelchair level and in standing. Dancing focused on coordination, gross movement on R side, sequencing, following directions, and selective attention in a busy/distracting environment. Patient somewhat self-conscious of herself, apologizing for being "awkward." Standing "dancing" including marching to the beat, forward/backward stepping, and 360deg turns. Completed with B hand held assist at [facility] level.   Returned to her room and concluded session with gait training ~45ft with L HHA only to challenge stability and control. MinA for the first ~67ft, fading to modA for remaining distance due to  fatigue and distractions from her family. Patient concluded session seated in recliner with all needs met, family updated.     Therapy Documentation Precautions:  Precautions Precautions: Fall Precaution Comments: R hemi Restrictions Weight Bearing Restrictions: No General:     Therapy/Group: Individual Therapy  Kimberl Vig P Yanelis Osika  PT, DPT, CSRS  02/16/2023, 7:54 AM

## 2023-02-16 NOTE — Progress Notes (Signed)
Occupational Therapy Session Note  Patient Details  Name: Loretta Gates MRN: [id number] Date of Birth: 1936-10-01  {CHL IP REHAB OT TIME CALCULATIONS:[id number]}   Short Term Goals: Week 1:  OT Short Term Goal 1 (Week 1): Pt will complete toilet transfer at ambulation level with min assist. OT Short Term Goal 2 (Week 1): Pt will complete donning/doffing pants and brief/underwear with min assist and mod cues to attend to right side of body. OT Short Term Goal 3 (Week 1): Pt will donn/doff shirt with min assist and min cues to attend to right side of body. OT Short Term Goal 4 (Week 1): Pt will bathe UB/LB using shower bench with min assist and min cues.  Skilled Therapeutic Interventions/Progress Updates:  Pt received *** for skilled OT session with focus on ***. Pt agreeable to interventions, demonstrating overall *** mood. Pt reported ***/10 pain, stating "***" in reference to ***. OT offering intermediate rest breaks and positioning suggestions throughout session to address pain/fatigue and maximize participation/safety in session.    Pt remained *** with all immediate needs met at end of session. Pt continues to be appropriate for skilled OT intervention to promote further functional independence.   Therapy Documentation Precautions:  Precautions Precautions: Fall Precaution Comments: R hemi Restrictions Weight Bearing Restrictions: No   Therapy/Group: Individual Therapy  Lou Cal, OTR/L, MSOT  02/16/2023, 8:58 PM

## 2023-02-16 NOTE — Progress Notes (Signed)
Occupational Therapy Session Note  Patient Details  Name: Loretta Gates MRN: [id number] Date of Birth: [birth year]  Today's Date: 02/16/2023 OT Individual Time: 0702-0820 OT Individual Time Calculation (min): 78 min    Short Term Goals: Week 1:  OT Short Term Goal 1 (Week 1): Pt will complete toilet transfer at ambulation level with min assist. OT Short Term Goal 2 (Week 1): Pt will complete donning/doffing pants and brief/underwear with min assist and mod cues to attend to right side of body. OT Short Term Goal 3 (Week 1): Pt will donn/doff shirt with min assist and min cues to attend to right side of body. OT Short Term Goal 4 (Week 1): Pt will bathe UB/LB using shower bench with min assist and min cues.  Skilled Therapeutic Interventions/Progress Updates:    Patient agreeable to participate in OT session. Reports 0/10 pain level.   Patient participated in skilled OT session focusing on ADL re-training, functional transfers, RUE NM re-ed, and cognitive development skills. Therapist educated pt on proper hand placement and sequencing when completing functional transfers and sit<>stand transitions in order to improve safety awareness, judgement, and problem solving while participating in BADL tasks with assistance.  Pt eating breakfast upon therapy arrival with Daughter present and assisting as needed. Pt acknowledged therapist and turned to her right to look at her while she entered pt's room. LUE was used as primary extremity during self feeding. When finished Stedy was used to transfer onto low level toilet. Pt able to stand from elevated height with Min A. From low level toilet, mod assist was provided to power up.   During shower, BSC was used to provide a more stable back support as well as bilateral arm rests. With set-up of soap and washcloth, pt washed her face (using her left hand). When rinsing, using a clean washcloth, pt missed the right side of her face and required min assist to  complete task. Total Assist provided to wash and rinse hair. Pt was able to follow one step commands with increased time. Pt assisted with UB bathing while therapist provided VC and tactile cues for sequencing and thoroughness. Noted that patient attempted to use her Right UE to assist with washing without being cued although due to decreased motor coordination and strength pt did require additional physical assistance to complete. Overall, UB bathing was completed with Mod A and LB bathing was completed with Max A while seated.    Pt completed functional transfer from shower to w/c and also w/c to recliner during session. Grab bar was used to assist with stand and turning towards pt's right to sit in w/c. Provided VC for which foot and which direction to step during transfer. Occasional tactile cues also provided when needed.  While transferring from w/c to recliner, 1 person face to face technique used as pt stood and stepped towards her right side towards recliner. Due to large size of recliner, Min-mod A was needed to completely scoot buttocks/hips back into recliner. Positioning provided with pillows; 1 behind back, and 1 on each side under UE.   Seat belt alarm was not placed at end of session as daughter would be with pt and will not leave her unattended.     Therapy Documentation Precautions:  Precautions Precautions: Fall Precaution Comments: R hemi Restrictions Weight Bearing Restrictions: No  Therapy/Group: Individual Therapy  Limmie Patricia, OTR/L,CBIS  Supplemental OT - [facility] and [facility] Secure Chat Preferred   02/16/2023, 6:48 AM

## 2023-02-16 NOTE — Progress Notes (Signed)
Speech Language Pathology Daily Session Note  Patient Details  Name: Loretta Gates MRN: [id number] Date of Birth: Mar 02, 1936  Today's Date: 02/16/2023 SLP Individual Time: 1415-1500 SLP Individual Time Calculation (min): 45 min  Short Term Goals: Week 1: SLP Short Term Goal 1 (Week 1): Patient will consume current diet without overt s/s of aspiration with supervision level verbal cues for use of swallowing compensatory strategies. SLP Short Term Goal 2 (Week 1): Patient will demonstrate sustained attention to functional tasks for 5 minutes with Mod verbal cues for redirection. SLP Short Term Goal 3 (Week 1): Patient will attend to right enviornment/body during functional tasks with Mod multimodal cues. SLP Short Term Goal 4 (Week 1): Patient will verbalize 2 cognitive deficits and 2 physical deficits with Mod A multimodal cues.  Skilled Therapeutic Interventions: Skilled treatment session focused on cognitive goals. SLP facilitated session by attempting  to administer the [facility] Mental Status Examination (SLUMS). Patient appeared mildly anxious throughout, therefore, it was discontinued. However, patient did demonstrate deficits in short-term recall, attention and with visual perceptual tasks. Patient much more alert this session compared to yesterday and was independently oriented to time, place and situation. Ongoing Mod-Max verbal cues are needed for intellectual awareness into both her physical and cognitive deficits. Patient remains verbose and requires cues for topic maintenance with intermittent perseveration on topics observed. Patient with intermittent phonemic paraphasias that patent was able to self-monitor and correct independently with extra time. Patient left siting in recliner with husband present. Continue with current plan of care.      Pain No/Denies Pain   Therapy/Group: Individual Therapy  Shamir Sedlar 02/16/2023, 3:09 PM

## 2023-02-16 NOTE — Progress Notes (Signed)
Patient ID: Loretta Gates, female   DOB: [birth year], 87 y.o.   MRN: [id number]  Met with daughter and pt's husband who reports have spoken with Engineer, structural of RN and Director and feel their concerns have been addressed and feels more comfortable with pt's care here. Will continue to touch base with them and pt throughout stay here.

## 2023-02-16 NOTE — Progress Notes (Signed)
PROGRESS NOTE   Subjective/Complaints: No new complaints this morning Reciting poetry! Speech is much improved! Daughter appreciative of [place]'s and Melanie's care  ROS: +mood is much brighter today, dysarthria is improving  Objective:   No results found. Recent Labs    02/13/23 1804 02/14/23 0500  WBC 8.8 6.6  HGB 14.4 13.7  HCT 44.7 43.5  PLT 259 228   Recent Labs    02/13/23 1804 02/14/23 0500  NA  --  136  K  --  4.2  CL  --  105  CO2  --  21*  GLUCOSE  --  106*  BUN  --  16  CREATININE 0.82 0.71  CALCIUM  --  9.7    Intake/Output Summary (Last 24 hours) at 02/16/2023 1015 Last data filed at 02/16/2023 0855 Gross per 24 hour  Intake 477 ml  Output --  Net 477 ml        Physical Exam: Vital Signs Blood pressure 128/85, pulse 88, temperature 98.1 F (36.7 C), temperature source Oral, resp. rate 18, height 4\' 11"  (1.499 m), weight 79 kg, SpO2 97 %. Constitutional:      General: She is not in acute distress.    Appearance: She is obese. She is ill-appearing. BMI 35.18    Comments: Pt sitting up in bedside chair; awake, alert, husband holding her hand as they watch TV, NAD  HENT:     Head: Normocephalic and atraumatic.     Comments: Mild R facial droop Tongue midline R face sensation is absent    Right Ear: External ear normal.     Left Ear: External ear normal.     Nose: Nose normal. No congestion.     Mouth/Throat:     Mouth: Mucous membranes are dry.     Pharynx: Oropharynx is clear. No oropharyngeal exudate.  Eyes:     General:        Right eye: No discharge.        Left eye: No discharge.     Comments: L gaze preference- R visual field appears reduced on exam  Cardiovascular:     Rate and Rhythm: Normal rate. Rhythm irregular.     Heart sounds: Normal heart sounds. No murmur heard.    No gallop.  Pulmonary:     Effort: Pulmonary effort is normal. No respiratory distress.     Breath  sounds: Normal breath sounds. No wheezing, rhonchi or rales.  Abdominal:     General: Bowel sounds are normal. There is no distension.     Palpations: Abdomen is soft.     Tenderness: There is no abdominal tenderness.  Musculoskeletal:     Cervical back: Neck supple. No tenderness.     Comments: LUE 5/5 in Biceps, triceps, WE, grip and FA RUE- biceps 3/5; Triceps 3/5; WE 2/5; grip 2/5; and FA 2-/5 LLE- 5/5 in HF, KE, KF, DF and PF RLE- HF 2-/5; KE/KF 3-/5; DF 3-/5 and PF 3/5  Skin:    General: Skin is warm and dry.     Comments: RUE IV's x2- look OK No skin breakdown seen  Neurological:     Mental Status: She is alert.  Comments: Patient is alert.  Noted right-sided neglect.  Provides name and age.  Follows simple commands. Decreased sensation to light touch on RUE/RLE R sided neglect noted L gaze preference  Expressive aphasia improving Psychiatric:     Comments: Much brighter mood   Assessment/Plan: 1. Functional deficits which require 3+ hours per day of interdisciplinary therapy in a comprehensive inpatient rehab setting. Physiatrist is providing close team supervision and 24 hour management of active medical problems listed below. Physiatrist and rehab team continue to assess barriers to discharge/monitor patient progress toward functional and medical goals  Care Tool:  Bathing              Bathing assist Assist Level: Moderate Assistance - Patient 50 - 74%     Upper Body Dressing/Undressing Upper body dressing        Upper body assist Assist Level: Moderate Assistance - Patient 50 - 74%    Lower Body Dressing/Undressing Lower body dressing            Lower body assist Assist for lower body dressing: Moderate Assistance - Patient 50 - 74%     Toileting Toileting    Toileting assist Assist for toileting: Moderate Assistance - Patient 50 - 74%     Transfers Chair/bed transfer  Transfers assist  Chair/bed transfer activity did not occur:  Safety/medical concerns  Chair/bed transfer assist level: Moderate Assistance - Patient 50 - 74%     Locomotion Ambulation   Ambulation assist      Assist level: 2 helpers Assistive device: Hand held assist Max distance: 24ft   Walk 10 feet activity   Assist     Assist level: 2 helpers Assistive device: Hand held assist   Walk 50 feet activity   Assist Walk 50 feet with 2 turns activity did not occur: Safety/medical concerns         Walk 150 feet activity   Assist Walk 150 feet activity did not occur: Safety/medical concerns         Walk 10 feet on uneven surface  activity   Assist Walk 10 feet on uneven surfaces activity did not occur: Safety/medical concerns         Wheelchair     Assist Is the patient using a wheelchair?: Yes Type of Wheelchair: Manual    Wheelchair assist level: Dependent - Patient 0%      Wheelchair 50 feet with 2 turns activity    Assist        Assist Level: Dependent - Patient 0%   Wheelchair 150 feet activity     Assist      Assist Level: Dependent - Patient 0%   Blood pressure 128/85, pulse 88, temperature 98.1 F (36.7 C), temperature source Oral, resp. rate 18, height 4\' 11"  (1.499 m), weight 79 kg, SpO2 97 %.    Medical Problem List and Plan: 1. 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             -patient may  shower             -ELOS/Goals: 17-24 days min A to supervision  Grounds pass ordered 2.  Impaired mobility: messaged [place] to see if Lovenox was d/c as per patient's preference             -antiplatelet therapy: Aspirin 81 mg daily and Brilinta 90 mg twice daily.  May resume Eliquis after SAH/ICH resolved in  7 to 10 days. 3. Pain Management: Tylenol as needed 4. Mood/Behavior/Sleep: Provide emotional support             -antipsychotic agents: Seroquel 25 mg nightly as needed 5.  Neuropsych/cognition: This patient is? capable of making decisions on her own behalf. 6. Skin/Wound Care: Routine skin checks 7. Fluids/Electrolytes/Nutrition: Routine in and outs with follow-up chemistries 8.  Atrial fibrillation with history of failed cardioversion.  Lopressor 75 mg twice daily.  Eliquis may be resumed after SAH/ICH resolved 7 to 10 days 9.  GERD.  Protonix 10.  Diastolic congestive heart failure.  Monitor for any signs of fluid overload- suggest daily weights.  11.  History of left breast cancer with chemotherapy and mastectomy.  Follow-up outpatient  12.  Morbid obesity.  BMI 37.16-->35.18.  Dietary follow-up  13.  Chronic anemia. Hgb reviewed and has normalized  14. Prediabetes: d/ced CBGs and insulin and discussed that insulin is not recommended for her current CBG levels, provided dietary education, discussed that we will get a fasting blood glucose every Monday with her routine BMP  15. Suboptimal magnesium: ordered 1 gram IV on 5/8. IV discontinued afterward since no more IV medications warranted. Discussed that we would not need repeat labs this admission  16. Expressive aphasia: commended on incredible improvements already!  17. Low protein levels: edited diet order to request that more protein be give with meals  18. Bleeding from Lovenox injection site: discussed with patient that this is a possible side effect of lovenox injections and the purpose is to help prevent clots, SCDs ordered for clot prevention as well, and discussed with patient that once she is ambulating 150 feet (goal for this week is 75) we can discontinue her lovenox as this level of ambulation can help prevent clots  >50 minutes spent in discussion of her grounds pass, medication list reviewed, messaged [place] regarding whether Lovenox was d/ced as per patient preference, discussed that we would not need scheduled repeat labs this admission    LOS: 3 days A FACE TO FACE EVALUATION WAS  PERFORMED  Treyden Hakim P Merian Wroe 02/16/2023, 10:15 AM

## 2023-02-17 DIAGNOSIS — I63512 Cerebral infarction due to unspecified occlusion or stenosis of left middle cerebral artery: Secondary | ICD-10-CM | POA: Diagnosis not present

## 2023-02-17 NOTE — Progress Notes (Signed)
Speech Language Pathology Daily Session Note  Patient Details  Name: Loretta Gates MRN: [id number] Date of Birth: Oct 23, 1935  Today's Date: 02/17/2023 SLP Individual Time: 0730-0830 SLP Individual Time Calculation (min): 60 min  Short Term Goals: Week 1: SLP Short Term Goal 1 (Week 1): Patient will consume current diet without overt s/s of aspiration with supervision level verbal cues for use of swallowing compensatory strategies. SLP Short Term Goal 2 (Week 1): Patient will demonstrate sustained attention to functional tasks for 5 minutes with Mod verbal cues for redirection. SLP Short Term Goal 3 (Week 1): Patient will attend to right enviornment/body during functional tasks with Mod multimodal cues. SLP Short Term Goal 4 (Week 1): Patient will verbalize 2 cognitive deficits and 2 physical deficits with Mod A multimodal cues.  Skilled Therapeutic Interventions: Skilled intervention focused on cognition. Daughter-[name] present during session. Pt was able to recall general medical timeline with hospitalization back in January. She was able to recall  a recent trip and specific activities and locations visited during trip to [facility]. She required moderate assistance to scan calendar to locate dates in far right corner. Education completed with patient and daughter on head turn strategy to see information on right side of page. Daily schedule written in large print for patient to practice scanning. She was able to answer basic questions about schedule with larger bolder print. Cont therapy per plan of care.      Pain Pain Assessment Pain Scale: Faces Faces Pain Scale: No hurt  Therapy/Group: Individual Therapy  Carlean Jews Evalena Fujii 02/17/2023, 9:11 AM

## 2023-02-17 NOTE — Progress Notes (Signed)
PROGRESS NOTE   Subjective/Complaints: Eating lunch with her husband Wants to be well Discussed how much she enjoyed her [place] trip  ROS: mood is much brighter today, dysarthria is improving, denies pain  Objective:   No results found. No results for input(s): "WBC", "HGB", "HCT", "PLT" in the last 72 hours.  No results for input(s): "NA", "K", "CL", "CO2", "GLUCOSE", "BUN", "CREATININE", "CALCIUM" in the last 72 hours.   Intake/Output Summary (Last 24 hours) at 02/17/2023 1755 Last data filed at 02/17/2023 1300 Gross per 24 hour  Intake 457 ml  Output --  Net 457 ml        Physical Exam: Vital Signs Blood pressure 99/71, pulse 80, temperature 98 F (36.7 C), temperature source Oral, resp. rate 19, height 4\' 11"  (1.499 m), weight 79 kg, SpO2 97 %. Constitutional:      General: She is not in acute distress.    Appearance: She is obese. She is ill-appearing. BMI 35.18    Comments: Pt sitting up in bedside chair; awake, alert, husband holding her hand as they watch TV, NAD  HENT:     Head: Normocephalic and atraumatic.     Comments: Mild R facial droop Tongue midline R face sensation is absent    Right Ear: External ear normal.     Left Ear: External ear normal.     Nose: Nose normal. No congestion.     Mouth/Throat:     Mouth: Mucous membranes are dry.     Pharynx: Oropharynx is clear. No oropharyngeal exudate.  Eyes:     General:        Right eye: No discharge.        Left eye: No discharge.     Comments: L gaze preference- R visual field appears reduced on exam  Cardiovascular:     Rate and Rhythm: Normal rate. Rhythm irregular.     Heart sounds: Normal heart sounds. No murmur heard.    No gallop.  Pulmonary:     Effort: Pulmonary effort is normal. No respiratory distress.     Breath sounds: Normal breath sounds. No wheezing, rhonchi or rales.  Abdominal:     General: Bowel sounds are normal. There is no  distension.     Palpations: Abdomen is soft.     Tenderness: There is no abdominal tenderness.  Musculoskeletal:     Cervical back: Neck supple. No tenderness.     Comments: LUE 5/5 in Biceps, triceps, WE, grip and FA RUE- biceps 3/5; Triceps 3/5; WE 2/5; grip 2/5; and FA 2-/5 LLE- 5/5 in HF, KE, KF, DF and PF RLE- HF 2-/5; KE/KF 3-/5; DF 3-/5 and PF 3/5  Skin:    General: Skin is warm and dry.     Comments: RUE IV's x2- look OK No skin breakdown seen  Neurological:     Mental Status: She is alert. Improved recall    Comments: Patient is alert.  Noted right-sided neglect.  Provides name and age.  Follows simple commands. Decreased sensation to light touch on RUE/RLE R sided neglect noted L gaze preference  Expressive aphasia improving Psychiatric:     Comments: Much brighter mood   Assessment/Plan:  1. Functional deficits which require 3+ hours per day of interdisciplinary therapy in a comprehensive inpatient rehab setting. Physiatrist is providing close team supervision and 24 hour management of active medical problems listed below. Physiatrist and rehab team continue to assess barriers to discharge/monitor patient progress toward functional and medical goals  Care Tool:  Bathing              Bathing assist Assist Level: Moderate Assistance - Patient 50 - 74%     Upper Body Dressing/Undressing Upper body dressing        Upper body assist Assist Level: Moderate Assistance - Patient 50 - 74%    Lower Body Dressing/Undressing Lower body dressing            Lower body assist Assist for lower body dressing: Moderate Assistance - Patient 50 - 74%     Toileting Toileting    Toileting assist Assist for toileting: Moderate Assistance - Patient 50 - 74%     Transfers Chair/bed transfer  Transfers assist  Chair/bed transfer activity did not occur: Safety/medical concerns  Chair/bed transfer assist level: Moderate Assistance - Patient 50 - 74%      Locomotion Ambulation   Ambulation assist      Assist level: 2 helpers Assistive device: Hand held assist Max distance: 63ft   Walk 10 feet activity   Assist     Assist level: 2 helpers Assistive device: Hand held assist   Walk 50 feet activity   Assist Walk 50 feet with 2 turns activity did not occur: Safety/medical concerns         Walk 150 feet activity   Assist Walk 150 feet activity did not occur: Safety/medical concerns         Walk 10 feet on uneven surface  activity   Assist Walk 10 feet on uneven surfaces activity did not occur: Safety/medical concerns         Wheelchair     Assist Is the patient using a wheelchair?: Yes Type of Wheelchair: Manual    Wheelchair assist level: Dependent - Patient 0%      Wheelchair 50 feet with 2 turns activity    Assist        Assist Level: Dependent - Patient 0%   Wheelchair 150 feet activity     Assist      Assist Level: Dependent - Patient 0%   Blood pressure 99/71, pulse 80, temperature 98 F (36.7 C), temperature source Oral, resp. rate 19, height 4\' 11"  (1.499 m), weight 79 kg, SpO2 97 %.    Medical Problem List and Plan: 1. 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             -patient may  shower             -ELOS/Goals: 17-24 days min A to supervision  Grounds pass ordered  Homocysteine reviewed and is 11.7 2.  Impaired mobility: continue SCDs             -antiplatelet therapy: Aspirin 81 mg daily and Brilinta 90 mg twice daily.  May resume Eliquis after SAH/ICH resolved in 7 to 10 days. 3. Pain: N/a 4. Mood/Behavior/Sleep: Provide emotional support             -antipsychotic agents: Seroquel 25 mg nightly as needed 5. Neuropsych/cognition: This patient is? capable of making decisions on her own behalf. 6. Skin/Wound Care: Routine skin checks  7. Fluids/Electrolytes/Nutrition:  Routine in and outs with follow-up chemistries 8.  Atrial fibrillation with history of failed cardioversion.  Continue Lopressor 75 mg twice daily.  Eliquis may be resumed after SAH/ICH resolved 7 to 10 days, repeat CT angio Head ordered to assess for resolution of SAH/ICH  9.  GERD.  Continue Protonix  10.  Diastolic congestive heart failure.  Monitor for any signs of fluid overload- Monitor daily weights.   11.  History of left breast cancer with chemotherapy and mastectomy.  Follow-up outpatient  12.  Morbid obesity.  BMI 37.16-->35.18.  Dietary follow-up  13.  Chronic anemia. Hgb reviewed and has normalized  14. Prediabetes: d/ced CBGs and insulin and discussed that insulin is not recommended for her current CBG levels, provided dietary education, discussed that we will get a fasting blood glucose every Monday with her routine BMP  15. Suboptimal magnesium: ordered 1 gram IV on 5/8. IV discontinued afterward since no more IV medications warranted. Discussed that we would not need repeat labs this admission  16. Expressive aphasia: commended on incredible improvements already!   17. Low protein levels: edited diet order to request that more protein be give with meals  18. Bleeding from Lovenox injection site: discussed with patient that this is a possible side effect of lovenox injections and the purpose is to help prevent clots, SCDs ordered for clot prevention as well, and discussed with patient that once she is ambulating 150 feet (goal for this week is 75) we can discontinue her lovenox as this level of ambulation can help prevent clots. Repeating Head CT to see if Eliquis can be restarted  19. Impaired cognition: continue SLP      LOS: 4 days A FACE TO FACE EVALUATION WAS PERFORMED  Drema Pry Nikolus Marczak 02/17/2023, 5:55 PM

## 2023-02-17 NOTE — Progress Notes (Signed)
Physical Therapy Session Note  Patient Details  Name: VIDETTE WILKINSON MRN: [id number] Date of Birth: 1936-05-21  Today's Date: 02/17/2023 PT Individual Time: 4098-1191 PT Individual Time Calculation (min): 60 min   Short Term Goals: Week 1:  PT Short Term Goal 1 (Week 1): Pt will complete bed mobility with minA PT Short Term Goal 2 (Week 1): Pt will complete bed<>chair transfers with minA and LRAD PT Short Term Goal 3 (Week 1): Pt will ambulate 47ft with minA and LRAD PT Short Term Goal 4 (Week 1): Pt will initiate stair training  Skilled Therapeutic Interventions/Progress Updates:  Patient greeted sitting upright in arm chair in room with daughter present and agreeable to PT treatment session. Patient stood from arm hair without the use of an AD and CGA/MinA- Patient then performed stand pivot transfer with R HHA and MinA. VC for turning all the way around and properly aligning herself with the wheelchair prior to sitting. Patient wheeled to/from rehab gym for time management and energy conservation.   Patient gait trained 2 x 15' with R HHA and MinA- VC for improved postural extension, forward gaze and B step length/foot clearance with minimal improvements noted. Patient required multimodal cues for locating her wheelchair, especially when located on the R side.   Patient gait trained x50' with RW and MinA- Same VC as above gait trials and therapist managing RW throughout secondary to R UE weakness and poor RW management. Multimodal cues for improved R attention throughout gait.  Patient stood without UE support and MinA from therapist while reaching to the R with her R UE for squigz on the mirror, pulling them off and then reaching across her body to the L and placing them in their container, completed x5 with R UE and HOH assistance from therapist throughout due to limited strength.  Patient then performed the same activity as above, however this time reached across her body with her L UE,  removed the squigz and then placed them in their container to her L. Patient required multimodal cues for locating the squigz on her R secondary to impaired R attention. Patient completed 2 x 5 with a seated rest break in between.   Patient performed x10 step-up to 4" steps with B HR and MinA- Patient led with R LE throughout with VC for improved foot clearance especially when removing her foot instead of dragging it off the step. Minimal improvements noted independently so therapist facilitated L lateral weight shift with moderate improvements noted.   Patient returned to her room and left sitting upright in wheelchair at the sink to brush her teeth with daughter present and agreeable to assisting the patient as needed. Multimodal cues throughout for using R UE to grip the toothpaste and L hand to unscrew the lid. Patient left with all needs met.    Therapy Documentation Precautions:  Precautions Precautions: Fall Precaution Comments: R hemi Restrictions Weight Bearing Restrictions: No  Pain: No/Denies pain.    Therapy/Group: Individual Therapy  Jeanni Allshouse 02/17/2023, 7:47 AM

## 2023-02-18 ENCOUNTER — Inpatient Hospital Stay (HOSPITAL_COMMUNITY): Payer: Medicare HMO

## 2023-02-18 DIAGNOSIS — I63512 Cerebral infarction due to unspecified occlusion or stenosis of left middle cerebral artery: Secondary | ICD-10-CM | POA: Diagnosis not present

## 2023-02-18 LAB — BASIC METABOLIC PANEL
Anion gap: 11 (ref 5–15)
BUN: 18 mg/dL (ref 8–23)
CO2: 22 mmol/L (ref 22–32)
Calcium: 9.5 mg/dL (ref 8.9–10.3)
Chloride: 107 mmol/L (ref 98–111)
Creatinine, Ser: 0.76 mg/dL (ref 0.44–1.00)
GFR, Estimated: >60 mL/min (ref >60)
Glucose, Bld: 143 mg/dL — ABNORMAL HIGH (ref 70–99)
Potassium: 3.8 mmol/L (ref 3.5–5.1)
Sodium: 140 mmol/L (ref 135–145)

## 2023-02-18 MED ORDER — IOHEXOL 350 MG/ML SOLN
75.0000 mL | Freq: Once | INTRAVENOUS | Status: AC | PRN
  Administered 2023-02-18: 75 mL via INTRAVENOUS

## 2023-02-18 NOTE — Progress Notes (Signed)
PROGRESS NOTE   Subjective/Complaints: Discussed plan with patient and daughter for routine Head CT to assess for resolution of bleed and to guide whether anticoagulation can be restarted  ROS: mood is much brighter today, dysarthria is improving, denies pain  Objective:   No results found. No results for input(s): "WBC", "HGB", "HCT", "PLT" in the last 72 hours.  No results for input(s): "NA", "K", "CL", "CO2", "GLUCOSE", "BUN", "CREATININE", "CALCIUM" in the last 72 hours.   Intake/Output Summary (Last 24 hours) at 02/18/2023 1038 Last data filed at 02/18/2023 0823 Gross per 24 hour  Intake 560 ml  Output --  Net 560 ml        Physical Exam: Vital Signs Blood pressure 135/70, pulse 65, temperature 97.7 F (36.5 C), resp. rate 18, height 4\' 11"  (1.499 m), weight 79 kg, SpO2 97 %. Constitutional:      General: She is not in acute distress.    Appearance: She is obese. She is ill-appearing. BMI 35.18    Comments: Pt sitting up in bedside chair; awake, alert, husband holding her hand as they watch TV, NAD  HENT:     Head: Normocephalic and atraumatic.     Comments: Mild R facial droop Tongue midline R face sensation is absent    Right Ear: External ear normal.     Left Ear: External ear normal.     Nose: Nose normal. No congestion.     Mouth/Throat:     Mouth: Mucous membranes are dry.     Pharynx: Oropharynx is clear. No oropharyngeal exudate.  Eyes:     General:        Right eye: No discharge.        Left eye: No discharge.     Comments: L gaze preference- R visual field appears reduced on exam  Cardiovascular:     Rate and Rhythm: Normal rate. Rhythm irregular.     Heart sounds: Normal heart sounds. No murmur heard.    No gallop.  Pulmonary:     Effort: Pulmonary effort is normal. No respiratory distress.     Breath sounds: Normal breath sounds. No wheezing, rhonchi or rales.  Abdominal:     General:  Bowel sounds are normal. There is no distension.     Palpations: Abdomen is soft.     Tenderness: There is no abdominal tenderness.  Musculoskeletal:     Cervical back: Neck supple. No tenderness.     Comments: LUE 5/5 in Biceps, triceps, WE, grip and FA RUE- biceps 3/5; Triceps 3/5; WE 2/5; grip 2/5; and FA 2-/5 LLE- 5/5 in HF, KE, KF, DF and PF RLE- HF 2-/5; KE/KF 3-/5; DF 3-/5 and PF 3/5  Skin:    General: Skin is warm and dry.     Comments: RUE IV's x2- look OK No skin breakdown seen  Neurological:     Mental Status: She is alert. Improved recall    Comments: Patient is alert.  Noted right-sided neglect.  Provides name and age.  Follows simple commands. Decreased sensation to light touch on RUE/RLE R sided neglect noted L gaze preference  Expressive aphasia improving Standing with CG/MinA Psychiatric:  Comments: Much brighter mood   Assessment/Plan: 1. Functional deficits which require 3+ hours per day of interdisciplinary therapy in a comprehensive inpatient rehab setting. Physiatrist is providing close team supervision and 24 hour management of active medical problems listed below. Physiatrist and rehab team continue to assess barriers to discharge/monitor patient progress toward functional and medical goals  Care Tool:  Bathing              Bathing assist Assist Level: Moderate Assistance - Patient 50 - 74%     Upper Body Dressing/Undressing Upper body dressing        Upper body assist Assist Level: Moderate Assistance - Patient 50 - 74%    Lower Body Dressing/Undressing Lower body dressing            Lower body assist Assist for lower body dressing: Moderate Assistance - Patient 50 - 74%     Toileting Toileting    Toileting assist Assist for toileting: Moderate Assistance - Patient 50 - 74%     Transfers Chair/bed transfer  Transfers assist  Chair/bed transfer activity did not occur: Safety/medical concerns  Chair/bed transfer assist  level: Moderate Assistance - Patient 50 - 74%     Locomotion Ambulation   Ambulation assist      Assist level: 2 helpers Assistive device: Hand held assist Max distance: 53ft   Walk 10 feet activity   Assist     Assist level: 2 helpers Assistive device: Hand held assist   Walk 50 feet activity   Assist Walk 50 feet with 2 turns activity did not occur: Safety/medical concerns         Walk 150 feet activity   Assist Walk 150 feet activity did not occur: Safety/medical concerns         Walk 10 feet on uneven surface  activity   Assist Walk 10 feet on uneven surfaces activity did not occur: Safety/medical concerns         Wheelchair     Assist Is the patient using a wheelchair?: Yes Type of Wheelchair: Manual    Wheelchair assist level: Dependent - Patient 0%      Wheelchair 50 feet with 2 turns activity    Assist        Assist Level: Dependent - Patient 0%   Wheelchair 150 feet activity     Assist      Assist Level: Dependent - Patient 0%   Blood pressure 135/70, pulse 65, temperature 97.7 F (36.5 C), resp. rate 18, height 4\' 11"  (1.499 m), weight 79 kg, SpO2 97 %.    Medical Problem List and Plan: 1. 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             -patient may  shower             -ELOS/Goals: 17-24 days min A to supervision  Grounds pass ordered  Homocysteine reviewed and is 11.7   Discussed patient's goal to go home this week 2.  Impaired mobility: continue SCDs             -antiplatelet therapy: Aspirin 81 mg daily and Brilinta 90 mg twice daily.  May resume Eliquis after SAH/ICH resolved in 7 to 10 days. 3. Pain: N/a 4. Mood/Behavior/Sleep: Provide emotional support             -antipsychotic agents: Seroquel 25 mg nightly as needed 5. Neuropsych/cognition: This patient is?  capable of making decisions on her own  behalf. 6. Skin/Wound Care: Routine skin checks 7. Fluids/Electrolytes/Nutrition: Routine in and outs with follow-up chemistries 8.  Atrial fibrillation with history of failed cardioversion.  Continue Lopressor 75 mg twice daily.  Eliquis may be resumed after SAH/ICH resolved 7 to 10 days, repeat CT angio Head ordered to assess for resolution of SAH/ICH, if no evidence of bleed on scan, please consult neurology regarding resuming Eliquis and whether Brillinta frequency should be decreased   9.  GERD.  Continue Protonix  10.  Diastolic congestive heart failure.  Monitor for any signs of fluid overload- Monitor daily weights.   11.  History of left breast cancer with chemotherapy and mastectomy.  Follow-up outpatient  12.  Morbid obesity.  BMI 37.16-->35.18.  Dietary follow-up  13.  Chronic anemia. Hgb reviewed and has normalized  14. Prediabetes: d/ced CBGs and insulin and discussed that insulin is not recommended for her current CBG levels, provided dietary education, discussed that we will get a fasting blood glucose every Monday with her routine BMP  15. Suboptimal magnesium: ordered 1 gram IV on 5/8. IV discontinued afterward since no more IV medications warranted. Discussed that we would not need repeat labs this admission  16. Expressive aphasia: commended on incredible improvements already!   17. Low protein levels: edited diet order to request that more protein be give with meals  18. Bleeding from Lovenox injection site: discussed with patient that this is a possible side effect of lovenox injections and the purpose is to help prevent clots, SCDs ordered for clot prevention as well, and discussed with patient that once she is ambulating 150 feet (goal for this week is 75) we can discontinue her lovenox as this level of ambulation can help prevent clots. Repeating Head CT to see if Eliquis can be restarted  19. Impaired cognition: continue SLP      LOS: 5 days A FACE TO FACE  EVALUATION WAS PERFORMED  Drema Pry Asanti Craigo 02/18/2023, 10:38 AM

## 2023-02-19 ENCOUNTER — Encounter (HOSPITAL_COMMUNITY): Payer: Self-pay

## 2023-02-19 DIAGNOSIS — I63512 Cerebral infarction due to unspecified occlusion or stenosis of left middle cerebral artery: Secondary | ICD-10-CM | POA: Diagnosis not present

## 2023-02-19 DIAGNOSIS — B372 Candidiasis of skin and nail: Secondary | ICD-10-CM

## 2023-02-19 DIAGNOSIS — R4589 Other symptoms and signs involving emotional state: Secondary | ICD-10-CM | POA: Diagnosis not present

## 2023-02-19 DIAGNOSIS — I482 Chronic atrial fibrillation, unspecified: Secondary | ICD-10-CM

## 2023-02-19 LAB — METHYLMALONIC ACID, SERUM: Methylmalonic Acid, Quantitative: 205 nmol/L (ref 0–378)

## 2023-02-19 MED ORDER — APIXABAN 5 MG PO TABS
5.0000 mg | ORAL_TABLET | Freq: Two times a day (BID) | ORAL | Status: DC
  Administered 2023-02-19 – 2023-02-25 (×13): 5 mg via ORAL
  Filled 2023-02-19 (×13): qty 1

## 2023-02-19 MED ORDER — NYSTATIN 100000 UNIT/GM EX POWD
Freq: Two times a day (BID) | CUTANEOUS | Status: DC
  Filled 2023-02-19: qty 15

## 2023-02-19 NOTE — Progress Notes (Signed)
CT of the head reviewed by neurology services.  Aspirin to be discontinued and Eliquis resumed and patient would continue with Brilinta 45 mg twice daily until follow-up with neurology services.

## 2023-02-19 NOTE — Progress Notes (Addendum)
PROGRESS NOTE   Subjective/Complaints: Pt doing well overall. Had a good session with PT. Walked back from gym to room. Has some discomfort from bruising in RLQ of abdomen as well as right inguinal area.   ROS: Patient denies fever, rash, sore throat, blurred vision, dizziness, nausea, vomiting, diarrhea, cough, shortness of breath or chest pain, joint or back/neck pain, headache, or mood change.    Objective:   CT ANGIO HEAD W OR WO CONTRAST  Result Date: 02/18/2023 CLINICAL DATA:  Subarachnoid hemorrhage EXAM: CT ANGIOGRAPHY HEAD TECHNIQUE: Multidetector CT imaging of the head was performed using the standard protocol during bolus administration of intravenous contrast. Multiplanar CT image reconstructions and MIPs were obtained to evaluate the vascular anatomy. RADIATION DOSE REDUCTION: This exam was performed according to the departmental dose-optimization program which includes automated exposure control, adjustment of the mA and/or kV according to patient size and/or use of iterative reconstruction technique. CONTRAST:  75mL OMNIPAQUE IOHEXOL 350 MG/ML SOLN COMPARISON:  None Available. FINDINGS: CT HEAD Brain: Unchanged size of intraparenchymal hematoma in the left temporal lobe with progressive loss of density. No residual subarachnoid blood is visible. There is periventricular hypoattenuation compatible with chronic microvascular disease. Vascular: Atherosclerotic calcification of the internal carotid arteries at the skull base. No abnormal hyperdensity of the major intracranial arteries or dural venous sinuses. Skull: The visualized skull base, calvarium and extracranial soft tissues are normal. Sinuses/Orbits: No fluid levels or advanced mucosal thickening of the visualized paranasal sinuses. No mastoid or middle ear effusion. The orbits are normal. CTA HEAD POSTERIOR CIRCULATION: --Vertebral arteries: Normal --Inferior cerebellar  arteries: Normal. --Basilar artery: Normal. --Superior cerebellar arteries: Normal. --Posterior cerebral arteries: Normal. The right PCA is predominantly supplied by the posterior communicating artery. ANTERIOR CIRCULATION: --Intracranial internal carotid arteries: Atherosclerotic calcification of the internal carotid arteries at the skull base without hemodynamically significant stenosis. --Anterior cerebral arteries (ACA): Normal. --Middle cerebral arteries (MCA): Normal. Patent stent in the left cervical ICA. Venous sinuses: As permitted by contrast timing, patent. Review of the MIP images confirms the above findings. IMPRESSION: 1. Unchanged size of intraparenchymal hematoma in the left temporal lobe with progressive decrease of density. No residual subarachnoid blood is visible. 2. No intracranial aneurysm or vascular lesion. 3. Patent stent in the left cervical ICA. Electronically Signed   By: Deatra Robinson M.D.   On: 02/18/2023 22:24   No results for input(s): "WBC", "HGB", "HCT", "PLT" in the last 72 hours.  Recent Labs    02/18/23 1415  NA 140  K 3.8  CL 107  CO2 22  GLUCOSE 143*  BUN 18  CREATININE 0.76  CALCIUM 9.5     Intake/Output Summary (Last 24 hours) at 02/19/2023 1610 Last data filed at 02/19/2023 0915 Gross per 24 hour  Intake 240 ml  Output --  Net 240 ml        Physical Exam: Vital Signs Blood pressure 110/78, pulse 86, temperature 97.9 F (36.6 C), temperature source Oral, resp. rate 18, height 4\' 11"  (1.499 m), weight 79 kg, SpO2 98 %. Constitutional: No distress . Vital signs reviewed. HEENT: NCAT, EOMI, oral membranes moist Neck: supple Cardiovascular: RRR without murmur. No JVD  Respiratory/Chest: CTA Bilaterally without wheezes or rales. Normal effort    GI/Abdomen: BS +, non-tender, non-distended Ext: no clubbing, cyanosis, or edema Psych: pleasant and cooperative Skin: IV RUE. Bruising RLQ, some moistened sl macerated sking in inguinal and abdominal  folds on right. Maybe early candida.  Musc: Full ROM, No pain with AROM or PROM in the neck, trunk, or extremities. Posture appropriate  Neuro:         Comments: LUE 5/5 in Biceps, triceps, WE, grip and FA RUE- biceps 3+/5; Triceps 3+/5; WE 3+/5; grip 3+/5; and FA 3+/5 LLE- 5/5 in HF, KE, KF, DF and PF RLE- HF 3+5; KE/KF 3+/5; DF 3-+5 and PF 3+/5     Mental Status: She is alert. Improved recall    Comments: Patient is alert.  Noted right-sided inattention impoving!.    Decreased sensation to light touch on RUE/RLE L gaze preference  Expressive aphasia improving--language much more automatic and fluid Standing with supervision    Assessment/Plan: 1. Functional deficits which require 3+ hours per day of interdisciplinary therapy in a comprehensive inpatient rehab setting. Physiatrist is providing close team supervision and 24 hour management of active medical problems listed below. Physiatrist and rehab team continue to assess barriers to discharge/monitor patient progress toward functional and medical goals  Care Tool:  Bathing              Bathing assist Assist Level: Moderate Assistance - Patient 50 - 74%     Upper Body Dressing/Undressing Upper body dressing        Upper body assist Assist Level: Moderate Assistance - Patient 50 - 74%    Lower Body Dressing/Undressing Lower body dressing            Lower body assist Assist for lower body dressing: Moderate Assistance - Patient 50 - 74%     Toileting Toileting    Toileting assist Assist for toileting: Moderate Assistance - Patient 50 - 74%     Transfers Chair/bed transfer  Transfers assist  Chair/bed transfer activity did not occur: Safety/medical concerns  Chair/bed transfer assist level: Moderate Assistance - Patient 50 - 74%     Locomotion Ambulation   Ambulation assist      Assist level: 2 helpers Assistive device: Hand held assist Max distance: 47ft   Walk 10 feet  activity   Assist     Assist level: 2 helpers Assistive device: Hand held assist   Walk 50 feet activity   Assist Walk 50 feet with 2 turns activity did not occur: Safety/medical concerns         Walk 150 feet activity   Assist Walk 150 feet activity did not occur: Safety/medical concerns         Walk 10 feet on uneven surface  activity   Assist Walk 10 feet on uneven surfaces activity did not occur: Safety/medical concerns         Wheelchair     Assist Is the patient using a wheelchair?: Yes Type of Wheelchair: Manual    Wheelchair assist level: Dependent - Patient 0%      Wheelchair 50 feet with 2 turns activity    Assist        Assist Level: Dependent - Patient 0%   Wheelchair 150 feet activity     Assist      Assist Level: Dependent - Patient 0%   Blood pressure 110/78, pulse 86, temperature 97.9 F (36.6 C), temperature source Oral, resp. rate 18, height 4\' 11"  (1.499 m),  weight 79 kg, SpO2 98 %.    Medical Problem List and Plan: 1. 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             -patient may  shower             -ELOS/Goals: 17-24 days min A to supervision  Grounds pass ordered  Homocysteine reviewed and is 11.7   -Continue CIR therapies including PT, OT, and SLP. Mas made nice functional gains! 2.  Impaired mobility: continue SCDs             -antiplatelet therapy: Resuming eliquis today along with Brilinta 45 mg twice daily.  Pt to follow up with neurology as outpt for further guidance with regimen  3. Pain: N/a 4. Mood/Behavior/Sleep: Provide emotional support             -antipsychotic agents: Seroquel 25 mg nightly as needed 5. Neuropsych/cognition: This patient is? capable of making decisions on her own behalf. 6. Skin/Wound Care: Routine skin checks  -pt receiving barrier cream to inguinal folds  5/13-will add nystatin  powder to inguinal and abdominal folds bid 7. Fluids/Electrolytes/Nutrition: Routine in and outs with follow-up chemistries 8.  Atrial fibrillation with history of failed cardioversion.  Continue Lopressor 75 mg twice daily.  Eliquis may be resumed after SAH/ICH resolved 7 to 10 days, -neuro has reviewed repeat CT angio Head  -Head CT demonstrates SAH resolved and IPH stable and evolving.  -Neuro recs resuming  Eliquis with Brillinta until outpt follow up  9.  GERD.  Continue Protonix  10.  Diastolic congestive heart failure.  Monitor for any signs of fluid overload- Monitor daily weights.   11.  History of left breast cancer with chemotherapy and mastectomy.  Follow-up outpatient  12.  Morbid obesity.  BMI 37.16-->35.18.  Dietary follow-up  13.  Chronic anemia. Hgb reviewed and has normalized  14. Prediabetes: d/ced CBGs and insulin and discussed that insulin is not recommended for her current CBG levels, provided dietary education  -5/13--no bmet today--yesterday's notable for glucose of 143 but it was done at 1400 15. Suboptimal magnesium: ordered 1 gram IV on 5/8. IV discontinued afterward since no more IV medications warranted. Discussed that we would not need repeat labs this admission  16. Expressive aphasia: commended on incredible improvements already!   17. Low protein levels: edited diet order to request that more protein be give with meals  18. Bleeding from Lovenox injection site: lovenox stopped.   5/13-reassured pt/daughter that discomfort should improve with time  -now on eliquis as above  19. Impaired cognition: continue SLP      LOS: 6 days A FACE TO FACE EVALUATION WAS PERFORMED  Ranelle Oyster 02/19/2023, 9:23 AM

## 2023-02-19 NOTE — Progress Notes (Signed)
Occupational Therapy Session Note  Patient Details  Name: Loretta Gates MRN: [id number] Date of Birth: 03/07/1936  Today's Date: 02/19/2023 OT Individual Time: 1305-1350 OT Individual Time Calculation (min): 45 min    Short Term Goals: Week 1:  OT Short Term Goal 1 (Week 1): Pt will complete toilet transfer at ambulation level with min assist. OT Short Term Goal 2 (Week 1): Pt will complete donning/doffing pants and brief/underwear with min assist and mod cues to attend to right side of body. OT Short Term Goal 3 (Week 1): Pt will donn/doff shirt with min assist and min cues to attend to right side of body. OT Short Term Goal 4 (Week 1): Pt will bathe UB/LB using shower bench with min assist and min cues.  Skilled Therapeutic Interventions/Progress Updates:     Pt received returning from using the bathroom with nursing staff upon OT arrival. Pt presenting to be in good spirits receptive to skilled OT session reporting 0/10 pain- OT offering intermittent rest breaks, repositioning, and therapeutic support to optimize participation in therapy session.   Transported Pt total A to therapy gym in [place] for time management and energy conservation. Worked on functional mobility using RW and functional transfers with emphasis on RW management, safety, and attention to R side. Pt completed functional mobility x2 trials ~32ft using RW with R hand positioned on RW hand splint d/t decreased grasp strength. Pt able to ambulate with min A and mod VB/tactile cues for RW management, attention to R side when turning to sit, and for technique to ensure the back of her legs were against the EOM prior to sitting.   Engaged Pt in functional reaching and visual scanning activities with functional cognition incorporated into task to increase her awareness to R side, RUE functional use, and attention/awareness for BADLs/IADLs. Pt completed activity seated on EOM in a moderately busy gym. Pt instructed to visually scan  to R side to locate cone of the color specified by OT. Pt then instructed to use her RUE to retrieve the cone and stack it on the left side of the table. Pt requiring mod VB and tactile cues to turn head to R to locate correct cone. Pt also requiring the color of the cone to be repeated ~50% during task or the directions repeated d/t poor recall and STM. Pt able to grasp and transport the cones to stack with min A to stabilize the cone and max VB cones for awareness to RUE placement, to maintain grasp, and fully release cone to prevent Pt from knocking it over. Placed same colored disk on R side of the table when Pt un-stacked the cones to facilitate increased visual scanning to R side and simple cognitive task of matching colored cones to disks. Pt able to retrieve cones using her RUE with min A d/t fatigue. Mod-max VB/tactile cues required for Pt to visually scan to R to locate disk and to match cone to same color.   Pt presenting with decreased insight into her deficits and decreased attention and awareness to R side. Mod-max VB /tactile cues required to increase Pt safety during functional activities and functional mobility. Pt handed off to SLP at end of session with all needs met.    Therapy Documentation Precautions:  Precautions Precautions: Fall Precaution Comments: R hemi Restrictions Weight Bearing Restrictions: No  Therapy/Group: Individual Therapy  Army Fossa 02/19/2023, 8:02 AM

## 2023-02-19 NOTE — Progress Notes (Signed)
Physical Therapy Session Note  Patient Details  Name: Loretta Gates MRN: [id number] Date of Birth: 01-23-1936  Today's Date: 02/19/2023 PT Individual Time: 0915-0940 PT Individual Time Calculation (min): 25 min   Short Term Goals: Week 1:  PT Short Term Goal 1 (Week 1): Pt will complete bed mobility with minA PT Short Term Goal 2 (Week 1): Pt will complete bed<>chair transfers with minA and LRAD PT Short Term Goal 3 (Week 1): Pt will ambulate 26ft with minA and LRAD PT Short Term Goal 4 (Week 1): Pt will initiate stair training  Skilled Therapeutic Interventions/Progress Updates:    Chart reviewed and pt agreeable to therapy. Pt received seated in chair with daughter present with no c/o pain. Session focused on amb quality and balance to promote safe home access. Pt initiated session with sit to stand and amb initiation using MinA + R HHA. Pt noted to attempt L furniture touching, so PT incorporated RW. Pt then completed amb of 16ft using MinA + RW + strong VC for RW management. Pt then competed series of balance tasks including neutral stance, narrow stance, and eyes closed all with CGA. Pt noted to have most increased sway with narrow stance. At end of session, pt was left seated in recliner with daughter present to monitor safety, nurse call bell and all needs in reach. Of note, pt found without seat alarm from previous session since rest break was 15 mins. Daughter explained she was agreeable to stay in room for 15 mins before PT and will continue to stay in room with pt for 15 min break between this PT session and neuropsych.     Therapy Documentation Precautions:  Precautions Precautions: Fall Precaution Comments: R hemi Restrictions Weight Bearing Restrictions: No General:       Therapy/Group: Individual Therapy  Dionne Milo, PT, DPT 02/19/2023, 10:13 AM

## 2023-02-19 NOTE — Progress Notes (Signed)
Physical Therapy Session Note  Patient Details  Name: Loretta Gates MRN: [id number] Date of Birth: 1936-04-10  Today's Date: 02/19/2023 PT Individual Time: 4098-1191 + 4782-9562 PT Individual Time Calculation (min): 43 min  + 27 min  Short Term Goals: Week 1:  PT Short Term Goal 1 (Week 1): Pt will complete bed mobility with minA PT Short Term Goal 2 (Week 1): Pt will complete bed<>chair transfers with minA and LRAD PT Short Term Goal 3 (Week 1): Pt will ambulate 9ft with minA and LRAD PT Short Term Goal 4 (Week 1): Pt will initiate stair training  Skilled Therapeutic Interventions/Progress Updates:      1st session: Pt agreeable to therapy session - daughter in the room assisting with oral care. Assisted patient with UB and LB dressing - modA for LB and minA for UB. Donned shoes with totalA for time.   Transported to main rehab gym in wheelchair. Focused remainder of session on gait training.  Sit<>stand with R HHA with CGA/minA - ambulated 159ft with R HHA and minA with cues for lengthening stride length, upright posture, and forward gaze. Continued gait training with side stepping L<>R along // bars using mirror for visual feedback, 4x16ft with minA for postural support and for attention.   Patient highly distractible in gym environment - cues needed for task attenuation during session.  Ambulated from main rehab gym to her room, ~184ft, with R HHA and minA. Patient reaching for hand rails, walls, and other environment support systems with her LUE. Increasing forward flexed trunk as she became fatigued, as well as shortened step length bilaterally. Patient ended the session seated in arm chair with daughter present - all needs met.     2nd session: Pt sitting on toilet with the [place] in front of her - husband outside bathroom supervising. Pt continent x2 and then transferred to sink in [place] to complete handwashing. Patient inattentive to her R side, trunk lean R.  Transported  in w/c to day room rehab gym. Focused remainder of session on gait training and building activity tolerance.  Sit<>stand with R HHA with minA for balance and initiation. Ambulated ~75ft + 68ft with minA with R HHA. Patient continues to show R inattention, difficulty relocating her wheelchair where she started from. Forward/backward ambulation 2x8 ft with B HHA from PT - cues for increasing stride length in both directions.  Finished with 1x10 repeated sit<>stands from lowered mat table height with B HHA and minA for trunk and postural awareness.   Returned to her room and completed ambulatory transfer with minA and R HHA from w/c to arm chair in her room. Husband present at the bedside. All needs met at end of session.       Therapy Documentation Precautions:  Precautions Precautions: Fall Precaution Comments: R hemi Restrictions Weight Bearing Restrictions: No General:     Therapy Group: Individual Therapy  Zauria Dombek P Nakeisha Greenhouse  PT, DPT, CSRS  02/19/2023, 7:45 AM

## 2023-02-19 NOTE — Progress Notes (Signed)
Speech Language Pathology Daily Session Note  Patient Details  Name: Loretta Gates MRN: [id number] Date of Birth: Feb 27, 1936  Today's Date: 02/19/2023 SLP Individual Time: 1400-1430 SLP Individual Time Calculation (min): 30 min  Short Term Goals: Week 1: SLP Short Term Goal 1 (Week 1): Patient will consume current diet without overt s/s of aspiration with supervision level verbal cues for use of swallowing compensatory strategies. SLP Short Term Goal 2 (Week 1): Patient will demonstrate sustained attention to functional tasks for 5 minutes with Mod verbal cues for redirection. SLP Short Term Goal 3 (Week 1): Patient will attend to right enviornment/body during functional tasks with Mod multimodal cues. SLP Short Term Goal 4 (Week 1): Patient will verbalize 2 cognitive deficits and 2 physical deficits with Mod A multimodal cues.  Skilled Therapeutic Interventions:  Pt was seen in PM to address cognitive re- training. Upon SLP arrival, pt requested to visit with pastor therefore SLP missed 15 minutes of skilled intervention. Pt retrieved 15 minutes later and agreeable to therapy given encouragement by her husband. Session completed in ST office with SLP addressing attention and attending to R side. This SLP sat on pt's right side and provided min cues throughout session to attend. During functional task, pt challenged to look at menu and identify options. Pt required mod to max cues for light house scanning and anchors for attention to right side. SLP educated pt's husband on techniques at end of session. In additional minutes of session, SLP addressed attention during structured task. Pt required mod to max cues for task completion with contributing deficits of working memory impacting performance. Pt returned to her room with her husband present and call button within reach. SLP to continue POC.   Pain  No pain  Therapy/Group: Individual Therapy  Renaee Munda 02/19/2023, 4:21 PM

## 2023-02-19 NOTE — Consult Note (Signed)
Neuropsychological Consultation Comprehensive Inpatient Rehab   Patient:   Loretta Gates   DOB:   [birth year]  MR Number:  [id number]  Location:  [facility] [address] CENTER A [id number] [place] [address] Dept: [phone] Loc: [phone]           Date of Service:   02/19/2023  Start Time:   10 AM End Time:   11 AM  Provider/Observer:  Arley Phenix, Psy.D.       Clinical Neuropsychologist       Billing Code/Service: [id number]  Reason for Service:    Loretta Gates is an 87 year old female referred for neuropsychological consultation due to coping and adjustment issues and ongoing cognitive difficulties following recent cerebrovascular accident and current admission onto the comprehensive inpatient rehabilitation unit.  Patient has a past history of breast cancer head and neck radiation treatments in the 1940s as a child.  Patient has more recent medical history including A-fib on Eliquis with history of failed cardioversion, hypertension, recent diagnosis of bladder cancer and is status post TURBT 02/05/2023.  Patient also with diastolic congestive heart failure, chronic venous insufficiency.  Breast cancer status post chemotherapy/mastectomy.  Patient had been taken off her anticoagulant for bladder procedure.  Patient presented on 02/06/2023 with intermittent transient left eye vision loss.  Cranial CT scan showed acute subarachnoid hemorrhage in the left sylvian fissure and over the left convexity.  MRA/MRI showed acute left MCA territory infarction involving large portions of the left parietal and temporal lobes with additional small acute infarcts also present in the left cerebellum and left occipital lobe as well as redemonstrating subarachnoid hemorrhage in left sylvian fissure.  Patient did have a recent CT scan on the unit and identified no progression of her hemorrhagic process.  The patient was awake  and sitting in her recliner chair in the upright position.  Patient's daughter was also in the room and was an active participant.  The patient was finishing taking her medications when I entered the room.  Patient had dropped her blood pressure pill onto the floor and replacement pill was required.  Patient attempted the second pill and dropped it as well trying to use her right hand which clearly had motor difficulties.  On the third attempt she was carefully instructed to use her left hand exclusively but she impulsively continue to try to reach and engage her right hand.  She was ultimately successful in taking this medication.  Clearly, the patient's daughter was worried about the patient's motivation with the patient continuing to have anxiety and quite anxious particularly about being discharged home.  Patient had limited awareness of deficits.  She did verbalize an awareness of some of her inattentive symptoms particular on visual processing and describe difficulty with processing visual information.  Patient's speech was slow and limited and it was a question about her full understanding.  Patient ruminated much of the visit about her desired to go home and at times minimized difficulties.  Patient was aware of her need for significant assistance.  Daughter stressed the need for the patient to be able to do things independently before discharge home.  Patient acknowledged significant anxiety and depressive type symptoms.  Patient is taking Seroquel 25 mg at night to aid with onset of sleep.  No other psychotropic medications are noted in review of past medicines no indications of previous psychotropic medications beyond low-dose Seroquel at night for sleep.  HPI for the  current admission:    HPI: Loretta Gates is a 87 year old Left-handed female with history of atrial fibrillation on Eliquis with history of failed cardioversion, hypertension, bladder cancer status post TURBT 02/05/2023, diastolic  congestive heart failure, chronic venous insufficiency, left breast cancer status postchemotherapy mastectomy, obesity with BMI 37.16, remote head and neck radiation 1940s age 60, left thyroidectomy and right inferior parathyroidectomy with chronic anemia. Per chart review lives with spouse. Uses a straight point cane prior to admission. Presented 02/06/2023 with intermittent transient left eye vision loss. Cranial CT scan showed acute subarachnoid hemorrhage in the left sylvian fissure and over the left convexity. MRA/MRI showed acute left MCA territory infarction involving large portions of the left parietal and temporal lobes with additional small acute infarcts also present in the left cerebellum and left occipital lobe as well as redemonstrating subarachnoid hemorrhage and left sylvian fissure as well as left ICA occlusion. Interventional radiology follow-up for nonocclusive clot with carotid stenting. Neurology follow-up maintained on aspirin and Brilinta 90 mg twice daily plan to resume Eliquis after SAH/ICH resolved in 7 to 10 days. She was cleared to begin Lovenox for DVT prophylaxis. Tolerating a regular diet. Therapy evaluations completed due to patient decreased functional mobility was admitted for a comprehensive rehab program.   Medical History:   Past Medical History:  Diagnosis Date   Anemia    Bladder cancer [facility])    urologist--- dr pace   Chronic diastolic (congestive) heart failure (HCC)    followed by dr Jens Som   Chronic venous insufficiency    Edema of both lower extremities    per pt wears compression hose   GERD (gastroesophageal reflux disease)    History of cancer chemotherapy    completed 2008 for left breast cancer   History of head and neck radiation 1946   per pt approx 1946 or 1947  (age 79) due to profound bilateral hearing loss told from scarlett fever/ measles,  once weekly for several weeks had  head/ neck radiation,  hearing was restored without neededing hearing  aids   History of left breast cancer 2008   malignant neoplasm of overlapping sites of left breast, ER+;  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 patel;   2006  s/p left thyroidectomy w/ right inferior parathyoidectomy  (per path speciman marked parathyroid but only thyroid tissue)   Hypertension    IDA (iron deficiency anemia)    hematology/ oncologist--- dr ennever/ sarah carter NP;  treated w/ IV iron infusions   Multinodular thyroid    Neuropathy    mild hands/ feet, uses cane   Nocturia more than twice per night    Nocturnal leg cramps    OA (osteoarthritis)    knees   OAB (overactive bladder)    Osteoporosis    PAF (paroxysmal atrial fibrillation) [facility])    cardiologist--- dr Jens Som   PONV (postoperative nausea and vomiting)    Vitamin D deficiency    Wears glasses          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   ICAO (internal carotid artery occlusion), left 02/06/2023   Chest pain 12/08/2022   Class 3 obesity (HCC) 10/21/2022   Acute on chronic diastolic CHF (congestive  heart failure) (HCC) 10/20/2022   IDA (iron deficiency anemia) 01/13/2022   Atrial fibrillation (HCC) 01/05/2022   Breast cancer (HCC) 01/05/2022   Primary hyperparathyroidism (HCC) 01/05/2022   Essential hypertension 01/05/2022   Age-related nuclear cataract of both eyes 12/11/2018   Abnormal INR 01/29/2018   Long term (current) use of anticoagulants 01/29/2018   Encounter for therapeutic drug monitoring 01/29/2018   Vitamin D deficiency 05/03/2017   S/P laparoscopic cholecystectomy 02/26/2017   Calculus of gallbladder without cholecystitis without obstruction 02/23/2017   Diarrhea of presumed infectious origin  02/23/2017   Hyperglycemia 02/23/2017   Right upper quadrant abdominal pain 02/23/2017   Lower extremity edema 01/18/2016   Age-related osteoporosis without current pathological fracture 01/17/2016   Diverticulosis of large intestine without hemorrhage 01/17/2016   GERD (gastroesophageal reflux disease) 01/17/2016   History of left breast cancer 01/17/2016   Multinodular goiter 01/17/2016   OAB (overactive bladder) 01/17/2016   History of cancer chemotherapy 03/06/2015   History of left mastectomy 03/06/2015   Malignant neoplasm of overlapping sites of left female breast (HCC) 03/06/2015    Behavioral Observation/Mental Status:   LEILYN JURKOWSKI  presents as a 87 y.o.-year-old Left handed Caucasian Female who appeared her stated age. her dress was Appropriate and she was Well Groomed and her manners were Appropriate to the situation.  her participation was indicative of Inattentive behaviors.  There were physical disabilities noted.  she displayed an appropriate level of cooperation and motivation.    Interactions:    Active Inattentive  Attention:   abnormal and attention span appeared shorter than expected for age  Memory:   abnormal; remote memory intact, recent memory impaired  Visuo-spatial:   abnormal  Speech (Volume):  low  Speech:   normal; very slow response style and very slowed word finding  Thought Process:  Circumstantial  Circumstantial, Obsessive, and Tangential  Though Content:  Rumination; not suicidal and not homicidal  Orientation:   person and place  Judgment:   Poor  Planning:   Poor  Affect:    Anxious  Mood:    Dysphoric  Insight:   Shallow  Intelligence:   normal  Psychiatric History:  No past psychiatric history noted but patient has had a significant history of medical interventions going back to head and neck radiation when she was an 84-year-old, previous diagnosis and treatment for breast cancer and now recent identification of bladder  cancer.  Family Med/Psych History:  Family History  Problem Relation Age of Onset   Sudden death Neg Hx     Impression/DX:   TASHUNDA LINEBARGER is an 87 year old female referred for neuropsychological consultation due to coping and adjustment issues and ongoing cognitive difficulties following recent cerebrovascular accident and current admission onto the comprehensive inpatient rehabilitation unit.  Patient has a past history of breast cancer head and neck radiation treatments in the 1940s as a child.  Patient has more recent medical history including A-fib on Eliquis with history of failed cardioversion, hypertension, recent diagnosis of bladder cancer and is status post TURBT 02/05/2023.  Patient also with diastolic congestive heart failure, chronic venous insufficiency.  Breast cancer status post chemotherapy/mastectomy.  Patient had been taken off her anticoagulant for bladder procedure.  Patient presented on 02/06/2023 with intermittent transient left eye vision loss.  Cranial CT scan showed acute subarachnoid hemorrhage in the left sylvian fissure and over the left convexity.  MRA/MRI showed acute left MCA territory infarction involving large portions of the left parietal and temporal lobes with  additional small acute infarcts also present in the left cerebellum and left occipital lobe as well as redemonstrating subarachnoid hemorrhage in left sylvian fissure.  Patient did have a recent CT scan on the unit and identified no progression of her hemorrhagic process.  The patient was awake and sitting in her recliner chair in the upright position.  Patient's daughter was also in the room and was an active participant.  The patient was finishing taking her medications when I entered the room.  Patient had dropped her blood pressure pill onto the floor and replacement pill was required.  Patient attempted the second pill and dropped it as well trying to use her right hand which clearly had motor difficulties.   On the third attempt she was carefully instructed to use her left hand exclusively but she impulsively continue to try to reach and engage her right hand.  She was ultimately successful in taking this medication.  Clearly, the patient's daughter was worried about the patient's motivation with the patient continuing to have anxiety and quite anxious particularly about being discharged home.  Patient had limited awareness of deficits.  She did verbalize an awareness of some of her inattentive symptoms particular on visual processing and describe difficulty with processing visual information.  Patient's speech was slow and limited and it was a question about her full understanding.  Patient ruminated much of the visit about her desired to go home and at times minimized difficulties.  Patient was aware of her need for significant assistance.  Daughter stressed the need for the patient to be able to do things independently before discharge home.  Patient acknowledged significant anxiety and depressive type symptoms.  Patient is taking Seroquel 25 mg at night to aid with onset of sleep.  No other psychotropic medications are noted in review of past medicines no indications of previous psychotropic medications beyond low-dose Seroquel at night for sleep.  Disposition/Plan:  Today we worked on coping and adjustment issues with significant anxiety noted today and fixation on being discharged home with significant ongoing cognitive and physical deficits.         Electronically Signed   _______________________ Arley Phenix, Psy.D. Clinical Neuropsychologist

## 2023-02-20 DIAGNOSIS — I63512 Cerebral infarction due to unspecified occlusion or stenosis of left middle cerebral artery: Secondary | ICD-10-CM | POA: Diagnosis not present

## 2023-02-20 MED ORDER — POLYETHYLENE GLYCOL 3350 17 G PO PACK
17.0000 g | PACK | Freq: Every day | ORAL | Status: DC | PRN
  Administered 2023-02-20 – 2023-02-21 (×2): 17 g via ORAL
  Filled 2023-02-20: qty 1

## 2023-02-20 MED ORDER — BISACODYL 10 MG RE SUPP: 10.0000 mg | Freq: Every day | RECTAL | Status: DC | PRN

## 2023-02-20 NOTE — Progress Notes (Signed)
Speech Language Pathology Daily Session Note  Patient Details  Name: MAKEDA RIDLEY MRN: [id number] Date of Birth: 1936-06-30  Today's Date: 02/20/2023 SLP Individual Time: 1000-1100 SLP Individual Time Calculation (min): 60 min  Short Term Goals: Week 1: SLP Short Term Goal 1 (Week 1): Patient will consume current diet without overt s/s of aspiration with supervision level verbal cues for use of swallowing compensatory strategies. SLP Short Term Goal 2 (Week 1): Patient will demonstrate sustained attention to functional tasks for 5 minutes with Mod verbal cues for redirection. SLP Short Term Goal 3 (Week 1): Patient will attend to right enviornment/body during functional tasks with Mod multimodal cues. SLP Short Term Goal 4 (Week 1): Patient will verbalize 2 cognitive deficits and 2 physical deficits with Mod A multimodal cues.  Skilled Therapeutic Interventions:  Pt was seen in am to address cognitive re- training. Pt was alert and seated upright in WC upon SLP arrival. Session completed in speech therapy office with distractions removed. SLP addressed sustained attention, attending to R side, and reasoning skills for awareness. Pt identified current month day of week, and date on calendar with most of information easily able to be identified due to presentation on L side. SLP challenged pt in identifying dates for upcoming weekend in order to challenge R side attention with pt requiring mod to max A for lighthouse scanning and anchor technique with eventual success. Pt also challenged in reading menu where she again warranted max cues for utilization of light house scanning and identifying anchor. In additional minutes of session, SLP addressed sustained attention to structured task, pt required mod A for completing sustained attention task with pt requesting repetition multiple times. SLP also addressed reasoning through challenging pt to identify similarities and differences in binary objects. R  inattention also addressed during this task with pt identifying object on R side with sup A in 100% of opportunities. Pt required mod A to identify similarities and differences with 43% acc. Pt returned to her room where her dtr was present. SLP recapped session and provided instruction in challenging pt outside of session for completion attention and R inattention task. Pt was left seated upright in WC with call button within reach and dtr present. SLP to continue POC.   Pain Pain Assessment Pain Scale: 0-10 Pain Score: 0-No pain  Therapy/Group: Individual Therapy  Renaee Munda 02/20/2023, 12:46 PM

## 2023-02-20 NOTE — Progress Notes (Signed)
Occupational Therapy Session Note  Patient Details  Name: Loretta Gates MRN: [id number] Date of Birth: January 27, 1936  Today's Date: 02/20/2023 Session 1 OT Individual Time: 0930-1000 OT Individual Time Calculation (min): 30 min   Session 2 OT Individual Time: 1102-1130 OT Individual Time Calculation (min): 28 min    Short Term Goals: Week 1:  OT Short Term Goal 1 (Week 1): Pt will complete toilet transfer at ambulation level with min assist. OT Short Term Goal 2 (Week 1): Pt will complete donning/doffing pants and brief/underwear with min assist and mod cues to attend to right side of body. OT Short Term Goal 3 (Week 1): Pt will donn/doff shirt with min assist and min cues to attend to right side of body. OT Short Term Goal 4 (Week 1): Pt will bathe UB/LB using shower bench with min assist and min cues.  Skilled Therapeutic Interventions/Progress Updates:    Session 1 Pt greeted seated in straight back chair with daughter present and agreeable to OT treatment session. Functional ambulation from room to dayroom with min/mod HHA. Pt needed moderate cues to avoid obstacles on R side, often drifting to the R. Pt also needed cues to maintain upright head/neck while ambulating. Addressed R hand FMC with graded peg board task. Pt had difficulty understanding the concept of copying a pattern for a puzzle. OT graded task by placing small pegs on peg board, mostly on R side, and having her remove with her R hand. OT also placed container for pegs on R side to facilitate visual scanning to the R. Focus on normal movement patterns within activity. Pt completed stand-pivot to wc with min A. Pt handoff to SLP for next therapy session.   Session 2 Pt greeted seated on toilet in bathroom after having urinary accident. Pt voided some in commode but did not get there in time per her daughter. Addressed self-care retraining, balance, R visual scanning and functional use of R UE while changing clothes seated on  BSC. Provided choice of two pants for pt to change into with one pair being placed further R. Pt needed cues for head turn to locate the second choice and chose the jeans. Focused use of R UE with reaching to thread and pull up pants. OT provided assistance for positioning of jeans to fasten button, which she was able to do with increased time and effort. Pt ambulated out of bathroom with mod HHA. Addressed upright posture with mirror feedback standing at the sink. Moderate cues for locating soap on R side of the sink then cues to initiate reach with R UE. Pt then ambulated in hallwya with mod HHA and cues for upright head/neck/ and hip extension. Pt left seated in wc with daughter present and needs met.   Therapy Documentation Precautions:  Precautions Precautions: Fall Precaution Comments: R hemi Restrictions Weight Bearing Restrictions: No Pain: Pain Assessment Pain Scale: 0-10 Pain Score: 0-No pain    Therapy/Group: Individual Therapy  [name] 02/20/2023, 11:50 AM

## 2023-02-20 NOTE — Progress Notes (Signed)
Physical Therapy Session Note  Patient Details  Name: Loretta Gates MRN: [id number] Date of Birth: Sep 06, 1936  Today's Date: 02/20/2023 PT Individual Time: 1345-1457 PT Individual Time Calculation (min): 72 min   Short Term Goals: Week 1:  PT Short Term Goal 1 (Week 1): Pt will complete bed mobility with minA PT Short Term Goal 2 (Week 1): Pt will complete bed<>chair transfers with minA and LRAD PT Short Term Goal 3 (Week 1): Pt will ambulate 27ft with minA and LRAD PT Short Term Goal 4 (Week 1): Pt will initiate stair training  Skilled Therapeutic Interventions/Progress Updates:       Pt sitting in chair next to her husband - agreeable to therapy. No reports of pain during treatment. Sit<>stand with minA and R HHA - ambulates in her room with similar assist to her w/c with cues for turning and safety approach. Husband asking about an AD for ambulation, specifically quad canes.  Pt transported to main rehab gym. Retrieved QC and adjusted to fit. Patient ambulated variable distances in rehab gyms, up to ~21ft with CGA/minA and QC. Patient with very slow gait speed, step-to gait pattern, decreased stance time on R, forward flexed trunk, and R arm "hanging" like a pendulum during gait. Retrieved youth RW and adjusted to fit. Patient ambulated similar distances, up to ~138ft, with CGA to supervision using RW. Increased speed, improved erect posture, improved stride length.   Gait speed with QC: 0.07 m/s Gait speed with RW: 0.19 m/s  In ADL apartment, practiced furniture transfers from low sitting sofa couch and recliners with CGA and RW. Also practiced navigating room environment, around foot of bed and furniture, with CGA and RW - max cues needed for attending to her R side.   Returned to her room and assisted back into chair next to her spouse. All needs met at end of session.   Therapy Documentation Precautions:  Precautions Precautions: Fall Precaution Comments: R  hemi Restrictions Weight Bearing Restrictions: No General:     Therapy/Group: Individual Therapy  Treylen Gibbs P Latonya Knight  PT, DPT, CSRS  02/20/2023, 7:49 AM

## 2023-02-20 NOTE — Progress Notes (Signed)
PROGRESS NOTE   Subjective/Complaints: Missed therapy at 8:30, messaged therapy and she will be arriving at 9:30 Making great progress! Daughter asks about visual deficits  ROS: Patient denies fever, rash, sore throat, blurred vision, dizziness, nausea, vomiting, diarrhea, cough, shortness of breath or chest pain, joint or back/neck pain, headache, or mood change. +visual deficits   Objective:   CT ANGIO HEAD W OR WO CONTRAST  Result Date: 02/18/2023 CLINICAL DATA:  Subarachnoid hemorrhage EXAM: CT ANGIOGRAPHY HEAD TECHNIQUE: Multidetector CT imaging of the head was performed using the standard protocol during bolus administration of intravenous contrast. Multiplanar CT image reconstructions and MIPs were obtained to evaluate the vascular anatomy. RADIATION DOSE REDUCTION: This exam was performed according to the departmental dose-optimization program which includes automated exposure control, adjustment of the mA and/or kV according to patient size and/or use of iterative reconstruction technique. CONTRAST:  75mL OMNIPAQUE IOHEXOL 350 MG/ML SOLN COMPARISON:  None Available. FINDINGS: CT HEAD Brain: Unchanged size of intraparenchymal hematoma in the left temporal lobe with progressive loss of density. No residual subarachnoid blood is visible. There is periventricular hypoattenuation compatible with chronic microvascular disease. Vascular: Atherosclerotic calcification of the internal carotid arteries at the skull base. No abnormal hyperdensity of the major intracranial arteries or dural venous sinuses. Skull: The visualized skull base, calvarium and extracranial soft tissues are normal. Sinuses/Orbits: No fluid levels or advanced mucosal thickening of the visualized paranasal sinuses. No mastoid or middle ear effusion. The orbits are normal. CTA HEAD POSTERIOR CIRCULATION: --Vertebral arteries: Normal --Inferior cerebellar arteries: Normal.  --Basilar artery: Normal. --Superior cerebellar arteries: Normal. --Posterior cerebral arteries: Normal. The right PCA is predominantly supplied by the posterior communicating artery. ANTERIOR CIRCULATION: --Intracranial internal carotid arteries: Atherosclerotic calcification of the internal carotid arteries at the skull base without hemodynamically significant stenosis. --Anterior cerebral arteries (ACA): Normal. --Middle cerebral arteries (MCA): Normal. Patent stent in the left cervical ICA. Venous sinuses: As permitted by contrast timing, patent. Review of the MIP images confirms the above findings. IMPRESSION: 1. Unchanged size of intraparenchymal hematoma in the left temporal lobe with progressive decrease of density. No residual subarachnoid blood is visible. 2. No intracranial aneurysm or vascular lesion. 3. Patent stent in the left cervical ICA. Electronically Signed   By: Deatra Robinson M.D.   On: 02/18/2023 22:24   No results for input(s): "WBC", "HGB", "HCT", "PLT" in the last 72 hours.  Recent Labs    02/18/23 1415  NA 140  K 3.8  CL 107  CO2 22  GLUCOSE 143*  BUN 18  CREATININE 0.76  CALCIUM 9.5     Intake/Output Summary (Last 24 hours) at 02/20/2023 0952 Last data filed at 02/19/2023 1839 Gross per 24 hour  Intake 358 ml  Output --  Net 358 ml        Physical Exam: Vital Signs Blood pressure 134/82, pulse 78, temperature 98.2 F (36.8 C), resp. rate 16, height 4\' 11"  (1.499 m), weight 79 kg, SpO2 96 %. Constitutional: No distress . Vital signs reviewed. BMI 35.18 HEENT: NCAT, EOMI, oral membranes moist Neck: supple Cardiovascular: RRR without murmur. No JVD    Respiratory/Chest: CTA Bilaterally without wheezes or rales. Normal effort  GI/Abdomen: BS +, non-tender, non-distended Ext: no clubbing, cyanosis, or edema Psych: pleasant and cooperative Skin: IV RUE. Bruising RLQ, some moistened sl macerated sking in inguinal and abdominal folds on right. Maybe early  candida.  Musc: Full ROM, No pain with AROM or PROM in the neck, trunk, or extremities. Posture appropriate  Neuro:         Comments: LUE 5/5 in Biceps, triceps, WE, grip and FA RUE- biceps 3+/5; Triceps 3+/5; WE 3+/5; grip 3+/5; and FA 3+/5 LLE- 5/5 in HF, KE, KF, DF and PF RLE- HF 3+5; KE/KF 3+/5; DF 3-+5 and PF 3+/5     Mental Status: She is alert. Improved recall    Comments: Patient is alert.  Noted right-sided inattention impoving!.    Decreased sensation to light touch on RUE/RLE L gaze preference  Expressive aphasia improving--language much more automatic and fluid Standing with supervision    Assessment/Plan: 1. Functional deficits which require 3+ hours per day of interdisciplinary therapy in a comprehensive inpatient rehab setting. Physiatrist is providing close team supervision and 24 hour management of active medical problems listed below. Physiatrist and rehab team continue to assess barriers to discharge/monitor patient progress toward functional and medical goals  Care Tool:  Bathing              Bathing assist Assist Level: Moderate Assistance - Patient 50 - 74%     Upper Body Dressing/Undressing Upper body dressing        Upper body assist Assist Level: Moderate Assistance - Patient 50 - 74%    Lower Body Dressing/Undressing Lower body dressing            Lower body assist Assist for lower body dressing: Moderate Assistance - Patient 50 - 74%     Toileting Toileting    Toileting assist Assist for toileting: Moderate Assistance - Patient 50 - 74%     Transfers Chair/bed transfer  Transfers assist  Chair/bed transfer activity did not occur: Safety/medical concerns  Chair/bed transfer assist level: Moderate Assistance - Patient 50 - 74%     Locomotion Ambulation   Ambulation assist      Assist level: 2 helpers Assistive device: Hand held assist Max distance: 21ft   Walk 10 feet activity   Assist     Assist level: 2  helpers Assistive device: Hand held assist   Walk 50 feet activity   Assist Walk 50 feet with 2 turns activity did not occur: Safety/medical concerns         Walk 150 feet activity   Assist Walk 150 feet activity did not occur: Safety/medical concerns         Walk 10 feet on uneven surface  activity   Assist Walk 10 feet on uneven surfaces activity did not occur: Safety/medical concerns         Wheelchair     Assist Is the patient using a wheelchair?: Yes Type of Wheelchair: Manual    Wheelchair assist level: Dependent - Patient 0%      Wheelchair 50 feet with 2 turns activity    Assist        Assist Level: Dependent - Patient 0%   Wheelchair 150 feet activity     Assist      Assist Level: Dependent - Patient 0%   Blood pressure 134/82, pulse 78, temperature 98.2 F (36.8 C), resp. rate 16, height 4\' 11"  (1.499 m), weight 79 kg, SpO2 96 %.    Medical Problem List and Plan: 1.  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             -patient may  shower             -ELOS/Goals: 17-24 days min A to supervision  Grounds pass ordered  Homocysteine reviewed and is 11.7   Continue CIR therapies including PT, OT, and SLP. MHas made nice functional gains! 2.  Impaired mobility: continue SCDs             -antiplatelet therapy: Resuming eliquis along with Brilinta 45 mg twice daily.  Pt to follow up with neurology as outpt for further guidance with regimen , discussed with daughter.  3. Pain: N/a 4. Mood/Behavior/Sleep: Provide emotional support             -antipsychotic agents: Seroquel 25 mg nightly as needed 5. Neuropsych/cognition: This patient is? capable of making decisions on her own behalf. 6. Skin/Wound Care: Routine skin checks  -pt receiving barrier cream to inguinal folds  5/13-will add nystatin powder to inguinal and abdominal folds bid 7.  Fluids/Electrolytes/Nutrition: Routine in and outs with follow-up chemistries 8.  Atrial fibrillation with history of failed cardioversion.  Continue Lopressor 75 mg twice daily.  Eliquis may be resumed after SAH/ICH resolved 7 to 10 days, -neuro has reviewed repeat CT angio Head  -Head CT demonstrates SAH resolved and IPH stable and evolving.  -Neuro recs resuming  Eliquis with Brillinta until outpt follow up  9.  GERD.  Continue Protonix  10.  Diastolic congestive heart failure.  Monitor for any signs of fluid overload- Monitor daily weights.   11.  History of left breast cancer with chemotherapy and mastectomy.  Follow-up outpatient  12.  Morbid obesity.  BMI 37.16-->35.18.  Dietary follow-up  13.  Chronic anemia. Hgb reviewed and has normalized  14. Prediabetes: d/ced CBGs and insulin and discussed that insulin is not recommended for her current CBG levels, provided dietary education  -5/13--no bmet today--yesterday's notable for glucose of 143 but it was done at 1400 15. Suboptimal magnesium: ordered 1 gram IV on 5/8. IV discontinued afterward since no more IV medications warranted. Discussed that we would not need repeat labs this admission  16. Expressive aphasia: commended on incredible improvements already!   17. Low protein levels: edited diet order to request that more protein be give with meals  18. Bleeding from Lovenox injection site: lovenox stopped.   5/13-reassured pt/daughter that discomfort should improve with time  -now on eliquis as above  19. Impaired cognition: continue SLP  20. Right truncal lean: discussed that right sided truncal muscles can also be weakened by stroke  21. Visual deficits: discussed that these can improve as patient continues to heal from stroke.     LOS: 7 days A FACE TO FACE EVALUATION WAS PERFORMED  Loretta Gates 02/20/2023, 9:52 AM

## 2023-02-21 DIAGNOSIS — I63512 Cerebral infarction due to unspecified occlusion or stenosis of left middle cerebral artery: Secondary | ICD-10-CM | POA: Diagnosis not present

## 2023-02-21 NOTE — Progress Notes (Signed)
Physical Therapy Weekly Progress Note  Patient Details  Name: Loretta Gates MRN: [id number] Date of Birth: [birth year]  Beginning of progress report period: Feb 14, 2023 End of progress report period: Feb 21, 2023  Today's Date: 02/21/2023 PT Individual Time: 0800-0900 PT Individual Time Calculation (min): 60 min   Patient has met 4 of 4 short term goals.  Loretta Gates is making appropriate progress towards LTG of CGA to minA. She continues to be primarily limited by R inattention, R sided weakness, cognitive deficits with poor attention and insight, and limited activity tolerance. She's progressing well and is currently at an overall minA level. Currently recommend youth RW for AD rather than cane - improved gait speed, stride length, and postural support compared to when ambulating with quad cane.   Patient continues to demonstrate the following deficits muscle weakness and muscle joint tightness, decreased cardiorespiratoy endurance, motor apraxia, decreased visual acuity and decreased visual perceptual skills, decreased attention to right, decreased initiation, decreased attention, decreased awareness, decreased problem solving, decreased safety awareness, and decreased memory, and decreased standing balance, decreased postural control, hemiplegia, and decreased balance strategies and therefore will continue to benefit from skilled PT intervention to increase functional independence with mobility.  Patient progressing toward long term goals..  Continue plan of care.  PT Short Term Goals Week 1:  PT Short Term Goal 1 (Week 1): Pt will complete bed mobility with minA PT Short Term Goal 1 - Progress (Week 1): Met PT Short Term Goal 2 (Week 1): Pt will complete bed<>chair transfers with minA and LRAD PT Short Term Goal 2 - Progress (Week 1): Met PT Short Term Goal 3 (Week 1): Pt will ambulate 65ft with minA and LRAD PT Short Term Goal 3 - Progress (Week 1): Met PT Short Term Goal 4 (Week 1):  Pt will initiate stair training PT Short Term Goal 4 - Progress (Week 1): Met PT Short Term Goal 5 (Week 1): STG = LTG  Skilled Therapeutic Interventions/Progress Updates:      Pt sitting in chair to start with her daughter present. Discussed with daughter recommendation for RW vs cane due to improved balance, posture, and gait speed. Daughter understanding of rec's. Also completed caregiver training for in-room transfers and toilet transfers using RW - reviewed need of having shoes/hospital socks, using the gait belt, and always using the RW. Daughter demonstrated safe ability to manage patient with ambulatory transfers in the room and her husband was present at end of session who was also trained. Safety plan updated.   Daughter assisting with UB/LB dressing for time management. Pt then transported to main rehab gym. Completed gait training with RW 140ft with CGA and intermittent minA for managing RW to turn R. No knee buckling or LOB observed, slow and cautious gait. Cues for forward gaze, upright posture, and keeping body within walker frame.  Remainder of session focused on visual scanning, R attention, and cognitive impairments. Completed line bissection test, clock drawing test, and bell cancellation test on the BITS. Patient having difficulty understanding instructions of line bisection test, needing max cues overall for success. She needed max cues for completing clock drawing test of "15 after 6." Unable to write numbers and clock hands were scrambled. Patient missed x14 "bells" on the cancellation test with disorganized scanning and problem solving.   Returned to her room at end of session - concluded session in chair with husband assisting with ambulatory transfer. Discussed briefly team conference this morning. All needs met.  Therapy Documentation Precautions:  Precautions Precautions: Fall Precaution Comments: R hemi Restrictions Weight Bearing Restrictions: No General:     Therapy/Group: Individual Therapy  Loretta Gates P Loretta Gates  PT, DPT, CSRS  02/21/2023, 7:40 AM

## 2023-02-21 NOTE — Patient Care Conference (Signed)
Inpatient RehabilitationTeam Conference and Plan of Care Update Date: 02/21/2023   Time: 11:18 AM    Patient Name: Loretta Gates      Medical Record Number: [id number]  Date of Birth: Jul 15, 1936 Sex: Female         Room/Bed: 4W23C/4W23C-01 Payor Info: Payor: [employer] MEDICARE / Plan: [employer] MEDICARE HMO/PPO / Product Type: *No Product type* /    Admit Date/Time:  02/13/2023  4:38 PM  Primary Diagnosis:  Left middle cerebral artery stroke [facility])  Hospital Problems: Principal Problem:   Left middle cerebral artery stroke [facility]) Active Problems:   Difficulty coping    Expected Discharge Date: Expected Discharge Date:  (ELOS 2-3 weeks)  Team Members Present:       Current Status/Progress Goal Weekly Team Focus  Bowel/Bladder   Continent of bowel/bladder (per family, pt strains to have BM and has small hard blobs of feces.)   Remain continent   Assist with toileting as needed    Swallow/Nutrition/ Hydration   regular/ thin   mod I  sup to min A    ADL's   min A for ADLs, impacted by visually inattention, decr aweanress of deficits   Min/supervision   fam education this week in prep for d/c this week, cogntive retraining, focus on right UE function.    Mobility   minA bed mobility, CGA sit<>stand, CGA stand pivot transfers, CGA with intermittent minA (RW management) gait 111ft with RW   CGA, min A steps  RNMR, R attention, family ed, gait training    Communication                Safety/Cognition/ Behavioral Observations  moderate cognitive deficts in recall, attention, awareness, and problem solving, R inattention   min A   R inattention, sustained attention, and awareness    Pain   No c/o pain at this time   Pain <3/10   Assess Qshift and prn    Skin   Skin intact with some redness on inguinal folds.   Maintain skin integrity and prevention of breakdown  Assess Qshift and prn      Discharge Planning:  Home with husband and daughter who are here daily and  very involved. Aware will need 24/7 care at DC. Pt on neuro-psych list to be seen   Team Discussion: Patient with visual deficits, right hand incoordination and poor awareness of deficits having difficulty with sustained attention and anxiety post left MCA CVA.  Patient on target to meet rehab goals: yes, currently stable, making good gains. Needs CGA for Sit - stand.  Able to ambulate up to 150' using a rolling walker but needs min assist for steering the walker due to inattention. Requires mod assist without an assistive device. Maintained on a regular diet thin liquid with supervision - min assist goals for discharge set for min assist for ADLs and CGA for mobility.   *See Care Plan and progress notes for long and short-term goals.   Revisions to Treatment Plan:  Follow up head CT - resolution of hemorrhage noted; Eliquis restarted per neurology   Teaching Needs: Safety, medications, dietary modification, transfers, etc.   Current Barriers to Discharge: Decreased caregiver support and Home enviroment access/layout  Possible Resolutions to Barriers: Family education HH follow up services DME: BSC, TTB     Medical Summary Current Status: SAH, IPH, depression, anxiety, bladder cancer, obesity, prediabetes  Barriers to Discharge: Medical stability;Behavior/Mood  Barriers to Discharge Comments: SAH, IPH, depression, anxiety, bladder cancer, obesity,  prediabetes Possible Resolutions to [employer]: CT scan reviewed and shows resolutation of SAH, neuro consulted and eliquis started, neuropsych eval, proved dietary education, d/c CBGs, d/c insulin, d/c lovenox   Continued Need for Acute Rehabilitation Level of Care: The patient requires daily medical management by a physician with specialized training in physical medicine and rehabilitation for the following reasons: Direction of a multidisciplinary physical rehabilitation program to maximize functional independence :  Yes Medical management of patient stability for increased activity during participation in an intensive rehabilitation regime.: Yes Analysis of laboratory values and/or radiology reports with any subsequent need for medication adjustment and/or medical intervention. : Yes   I attest that I was present, lead the team conference, and concur with the assessment and plan of the team.   Chana Bode B 02/21/2023, 4:04 PM

## 2023-02-21 NOTE — Progress Notes (Signed)
Patient ID: Loretta Gates, female   DOB: 03-18-1936, 87 y.o.   MRN: [id number]  Met with pt, daughter and pt's husband to inform them of team conference goals of CGA level and target discharge date of 5/19. Pt is making good progress and doing better than therapy team thought reason why can discharge sooner than later. All very pleased with how well she is doing and feel she will be ready on Sunday to go home. Daughter and husband have already been checked off on taking pt to the restroom when here in her room. Discussed awaiting DME recommendations and home health. Will work on both of these in preparation for discharge.

## 2023-02-21 NOTE — Progress Notes (Signed)
Occupational Therapy Weekly Progress Note  Patient Details  Name: Loretta Gates MRN: [id number] Date of Birth: 20-Sep-1936  Beginning of progress report period: Feb 14, 2023 End of progress report period: Feb 21, 2023  Today's Date: 02/21/2023 OT Individual Time: 4098-1191 OT Individual Time Calculation (min): 60 min    Patient has met 4 of 4 short term goals.  Patient is making steady progress towards OT goals. She is at an overall min A for functional ambulation w/ RW and BADL tasks. Pt continues to have decreased awareness of R hemi body.   Patient continues to demonstrate the following deficits: muscle weakness, decreased cardiorespiratoy endurance, impaired timing and sequencing, motor apraxia, decreased coordination, and decreased motor planning, decreased midline orientation, decreased attention to right, right side neglect, and decreased motor planning, decreased initiation, decreased attention, decreased awareness, decreased problem solving, decreased safety awareness, decreased memory, and delayed processing, and decreased sitting balance, decreased standing balance, decreased postural control, hemiplegia, and decreased balance strategies and therefore will continue to benefit from skilled OT intervention to enhance overall performance with BADL and functional use of RUE .  Patient progressing toward long term goals..  Continue plan of care.  OT Short Term Goals Week 1:  OT Short Term Goal 1 (Week 1): Pt will complete toilet transfer at ambulation level with min assist. OT Short Term Goal 1 - Progress (Week 1): Met OT Short Term Goal 2 (Week 1): Pt will complete donning/doffing pants and brief/underwear with min assist and mod cues to attend to right side of body. OT Short Term Goal 2 - Progress (Week 1): Met OT Short Term Goal 3 (Week 1): Pt will donn/doff shirt with min assist and min cues to attend to right side of body. OT Short Term Goal 3 - Progress (Week 1): Met OT Short  Term Goal 4 (Week 1): Pt will bathe UB/LB using shower bench with min assist and min cues. OT Short Term Goal 4 - Progress (Week 1): Met Week 2:  OT Short Term Goal 1 (Week 2): LTG =STG 2/2 ELOS  Skilled Therapeutic Interventions/Progress Updates:    Pt greeted sitting in wc with daughter and spouse present dancing to "In The Mood." Pt agreeable to OT treatment session. Pt ambulated to therapy gym with RW and CGA. She needed min A for turns with moderate cues to turn to the R. Pt also needs cues to avoid obstacles on R side. OT issued soft Tan theraputty. Addressed pinch, grip, and coordination with theraputty. Pt needed cues to only use R UE as she often tried to use her L hand. OT placed small beads in putty and worked on finger isolation to locate the beads, then pincer grasp to pinch out beads.OT cut medium sized cubes out of foam and continued working on R [facility] picking up foam and placing in cup. Tried to work on in hand manipulation to pull foam from the pocket of her hand, but unable to coordinate this yet. 3 sets of 10 chest press and bicep curl with guided A on R UE To maintain grasp on 1 lb weighted bar. Pt returned to room and ambulated back to chair with RW and CGA. Cues for locating chair on the R and getting around bed. Pt left seated in straight back chair with family present and needs met.    Therapy Documentation Precautions:  Precautions Precautions: Fall Precaution Comments: R hemi Restrictions Weight Bearing Restrictions: No Pain:  Denies pain   Therapy/Group: Individual Therapy  Gentry Fitz  S Alaia Lordi 02/21/2023, 11:34 AM

## 2023-02-21 NOTE — Progress Notes (Signed)
Speech Language Pathology Daily Session Note  Patient Details  Name: Loretta Gates MRN: [id number] Date of Birth: 1936/04/17  Today's Date: 02/21/2023 SLP Individual Time: 1300-1400 SLP Individual Time Calculation (min): 60 min  Short Term Goals: Week 1: SLP Short Term Goal 1 (Week 1): Patient will consume current diet without overt s/s of aspiration with supervision level verbal cues for use of swallowing compensatory strategies. SLP Short Term Goal 2 (Week 1): Patient will demonstrate sustained attention to functional tasks for 5 minutes with Mod verbal cues for redirection. SLP Short Term Goal 3 (Week 1): Patient will attend to right enviornment/body during functional tasks with Mod multimodal cues. SLP Short Term Goal 4 (Week 1): Patient will verbalize 2 cognitive deficits and 2 physical deficits with Mod A multimodal cues.  Skilled Therapeutic Interventions:  Pt was seen in PM to address cognitive re- training. Pt was alert and seated upright in chair upon SLP arrival. She was transitioned with SLP assist to [facility] and taken to ST office for completion of session. Pt oriented to temporal concepts with min A and external visual aids. Min A for attending to right side of calendar and menu during today's session which is an improvement from previous sessions where she required mod to max cues. Pt reluctant to participate in certain task and requiring encouragement for participation. Pt challenged in thought organization task where she initially warranted mod to max cues which were faded to min A for 83% acc. In final minutes of session, SLP addressed problem solving. Given a visual stimulus, pt was challenged to identify problem and identify solutions. Pt identified problems with 67% acc with mod to max cues. She identified solutions with 33% acc with mod to max cues. In missed opportunities, SLP provided correct response and rationale. Pt was returned back to her room with her husband present and  assisted back to chair per her husbands request. Pt requiring mod to max cues during transfer due to impulsivity (standing without locking brakes, hand placement, attempting to stand without walker and/or clinician close by). Pt was left with husband and call button within reach. SLP to continue POC.   Pain Pain Assessment Pain Scale: 0-10 Pain Score: 0-No pain  Therapy/Group: Individual Therapy  Renaee Munda 02/21/2023, 3:50 PM

## 2023-02-21 NOTE — Progress Notes (Signed)
PROGRESS NOTE   Subjective/Complaints: No new complaints this morning Team conference today Daughter and husband at bedside have no new concerns  ROS: Patient denies fever, rash, sore throat, blurred vision, dizziness, nausea, vomiting, diarrhea, cough, shortness of breath or chest pain, joint or back/neck pain, headache, or mood change. +visual deficits, +visual scanning impairment   Objective:   No results found. No results for input(s): "WBC", "HGB", "HCT", "PLT" in the last 72 hours.  Recent Labs    02/18/23 1415  NA 140  K 3.8  CL 107  CO2 22  GLUCOSE 143*  BUN 18  CREATININE 0.76  CALCIUM 9.5     Intake/Output Summary (Last 24 hours) at 02/21/2023 1206 Last data filed at 02/21/2023 0942 Gross per 24 hour  Intake 238 ml  Output --  Net 238 ml        Physical Exam: Vital Signs Blood pressure 128/75, pulse 88, temperature 98.6 F (37 C), resp. rate 18, height 4\' 11"  (1.499 m), weight 82.6 kg, SpO2 95 %. Constitutional: No distress . Vital signs reviewed. BMI 36.78 Gen: no distress, normal appearing HEENT: oral mucosa pink and moist, NCAT Cardio: Reg rate Chest: normal effort, normal rate of breathing Abd: soft, non-distended Ext: no edema Psych: pleasant, normal affect  Skin: IV RUE. Bruising RLQ, some moistened sl macerated sking in inguinal and abdominal folds on right. Maybe early candida.  Musc: Full ROM, No pain with AROM or PROM in the neck, trunk, or extremities. Posture appropriate  Neuro:         Comments: LUE 5/5 in Biceps, triceps, WE, grip and FA RUE- biceps 3+/5; Triceps 3+/5; WE 3+/5; grip 3+/5; and FA 3+/5 LLE- 5/5 in HF, KE, KF, DF and PF RLE- HF 3+5; KE/KF 3+/5; DF 3-+5 and PF 3+/5     Mental Status: She is alert. Improved recall    Comments: Patient is alert.  Noted right-sided inattention impoving!.    Decreased sensation to light touch on RUE/RLE L gaze preference  Expressive  aphasia improving--language much more automatic and fluid Standing with supervision    Assessment/Plan: 1. Functional deficits which require 3+ hours per day of interdisciplinary therapy in a comprehensive inpatient rehab setting. Physiatrist is providing close team supervision and 24 hour management of active medical problems listed below. Physiatrist and rehab team continue to assess barriers to discharge/monitor patient progress toward functional and medical goals  Care Tool:  Bathing              Bathing assist Assist Level: Moderate Assistance - Patient 50 - 74%     Upper Body Dressing/Undressing Upper body dressing        Upper body assist Assist Level: Moderate Assistance - Patient 50 - 74%    Lower Body Dressing/Undressing Lower body dressing            Lower body assist Assist for lower body dressing: Moderate Assistance - Patient 50 - 74%     Toileting Toileting    Toileting assist Assist for toileting: Moderate Assistance - Patient 50 - 74%     Transfers Chair/bed transfer  Transfers assist  Chair/bed transfer activity did not occur: Safety/medical concerns  Chair/bed transfer assist level: Minimal Assistance - Patient > 75%     Locomotion Ambulation   Ambulation assist      Assist level: Contact Guard/Touching assist Assistive device: Walker-rolling Max distance: 15ft   Walk 10 feet activity   Assist     Assist level: Contact Guard/Touching assist Assistive device: Walker-rolling   Walk 50 feet activity   Assist Walk 50 feet with 2 turns activity did not occur: Safety/medical concerns  Assist level: Contact Guard/Touching assist Assistive device: Walker-rolling    Walk 150 feet activity   Assist Walk 150 feet activity did not occur: Safety/medical concerns         Walk 10 feet on uneven surface  activity   Assist Walk 10 feet on uneven surfaces activity did not occur: Safety/medical concerns          Wheelchair     Assist Is the patient using a wheelchair?: No Type of Wheelchair: Manual    Wheelchair assist level: Dependent - Patient 0%      Wheelchair 50 feet with 2 turns activity    Assist        Assist Level: Dependent - Patient 0%   Wheelchair 150 feet activity     Assist      Assist Level: Dependent - Patient 0%   Blood pressure 128/75, pulse 88, temperature 98.6 F (37 C), resp. rate 18, height 4\' 11"  (1.499 m), weight 82.6 kg, SpO2 95 %.    Medical Problem List and Plan: 1. 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             -patient may  shower             -ELOS/Goals: 17-24 days min A to supervision  Grounds pass ordered  Homocysteine reviewed and is 11.7   Continue CIR therapies including PT, OT, and SLP. MHas made nice functional gains! 2.  Impaired mobility: continue SCDs             -antiplatelet therapy: Resuming eliquis along with Brilinta 45 mg twice daily.  Pt to follow up with neurology as outpt for further guidance with regimen , discussed with daughter.  3. Pain: N/a 4. Mood/Behavior/Sleep: Provide emotional support             -antipsychotic agents: Seroquel 25 mg nightly as needed 5. Neuropsych/cognition: This patient is? capable of making decisions on her own behalf. 6. Skin/Wound Care: Routine skin checks  -pt receiving barrier cream to inguinal folds  5/13-will add nystatin powder to inguinal and abdominal folds bid 7. Fluids/Electrolytes/Nutrition: Routine in and outs with follow-up chemistries 8.  Atrial fibrillation with history of failed cardioversion.  Continue Lopressor 75 mg twice daily.  Eliquis may be resumed after SAH/ICH resolved 7 to 10 days, -neuro has reviewed repeat CT angio Head  -Head CT demonstrates SAH resolved and IPH stable and evolving.  -Neuro recs resuming  Eliquis with Brillinta until outpt follow up  9.   GERD.  Continue Protonix  10.  Diastolic congestive heart failure.  Monitor for any signs of fluid overload- Monitor daily weights.   11.  History of left breast cancer with chemotherapy and mastectomy.  Follow-up outpatient  12.  Morbid obesity.  BMI 37.16-->35.18.  Dietary follow-up  13.  Chronic anemia. Hgb reviewed and has normalized  14. Prediabetes: d/ced CBGs and insulin and discussed that insulin is not recommended for her current  CBG levels, provided dietary education  -5/13--no bmet today--yesterday's notable for glucose of 143 but it was done at 1400 15. Suboptimal magnesium: ordered 1 gram IV on 5/8. IV discontinued afterward since no more IV medications warranted. Discussed that we would not need repeat labs this admission  16. Expressive aphasia: commended on incredible improvements already!   17. Low protein levels: edited diet order to request that more protein be give with meals  18. Bleeding from Lovenox injection site: lovenox stopped. Eliquis resumed  19. Impaired cognition: continue SLP, discussed impaired clock drawing test  20. Right truncal lean: discussed that right sided truncal muscles can also be weakened by stroke, encouraged continued sitting in chair rather than bed to strengthen truncal muscles  21. Visual deficits: discussed that these can improve as patient continues to heal from stroke. Continue visual scanning exercises.     LOS: 8 days A FACE TO FACE EVALUATION WAS PERFORMED  Drema Pry Jozsef Wescoat 02/21/2023, 12:06 PM

## 2023-02-22 ENCOUNTER — Ambulatory Visit: Payer: Medicare HMO | Admitting: Family Medicine

## 2023-02-22 MED ORDER — ACETAMINOPHEN 325 MG PO TABS: 650.0000 mg | ORAL_TABLET | ORAL | Status: AC | PRN

## 2023-02-22 MED ORDER — PSYLLIUM 95 % PO PACK
1.0000 | PACK | Freq: Every day | ORAL | Status: DC
  Administered 2023-02-23 – 2023-02-24 (×2): 1 via ORAL
  Filled 2023-02-22 (×3): qty 1

## 2023-02-22 NOTE — Progress Notes (Signed)
Patient ID: Loretta Gates, female   DOB: 1935/11/16, 87 y.o.   MRN: [id number]  Met with daughter and pt to discuss equipment needs they have a rolling walker husband to bring in today to see if will adjusts low enough for pt and daughter to order tub bench on-line. Also discussed formal family education both feel with family here 24/7 they do not need formal education and have been to therapies with pt and have been checked off for bathroom transfer have also observed in speech therapies. Will look for home health who can begin right away and work on discharge needs.

## 2023-02-22 NOTE — Discharge Summary (Signed)
Physician Discharge Summary  Patient ID: SHANTIQUA ORFIELD MRN: [id number] DOB/AGE: 12/01/1935 87 y.o.  Admit date: 02/13/2023 Discharge date: 02/25/2023  Discharge Diagnoses:  Principal Problem:   Left middle cerebral artery stroke [facility]) Active Problems:   Difficulty coping DVT prophylaxis Atrial fibrillation GERD Diastolic congestive heart failure History of left breast cancer Morbid obesity Chronic anemia Prediabetes Mood stabilization  Discharged Condition: Stable  Significant Diagnostic Studies: CT ANGIO HEAD W OR WO CONTRAST  Result Date: 02/18/2023 CLINICAL DATA:  Subarachnoid hemorrhage EXAM: CT ANGIOGRAPHY HEAD TECHNIQUE: Multidetector CT imaging of the head was performed using the standard protocol during bolus administration of intravenous contrast. Multiplanar CT image reconstructions and MIPs were obtained to evaluate the vascular anatomy. RADIATION DOSE REDUCTION: This exam was performed according to the departmental dose-optimization program which includes automated exposure control, adjustment of the mA and/or kV according to patient size and/or use of iterative reconstruction technique. CONTRAST:  75mL OMNIPAQUE IOHEXOL 350 MG/ML SOLN COMPARISON:  None Available. FINDINGS: CT HEAD Brain: Unchanged size of intraparenchymal hematoma in the left temporal lobe with progressive loss of density. No residual subarachnoid blood is visible. There is periventricular hypoattenuation compatible with chronic microvascular disease. Vascular: Atherosclerotic calcification of the internal carotid arteries at the skull base. No abnormal hyperdensity of the major intracranial arteries or dural venous sinuses. Skull: The visualized skull base, calvarium and extracranial soft tissues are normal. Sinuses/Orbits: No fluid levels or advanced mucosal thickening of the visualized paranasal sinuses. No mastoid or middle ear effusion. The orbits are normal. CTA HEAD POSTERIOR CIRCULATION: --Vertebral  arteries: Normal --Inferior cerebellar arteries: Normal. --Basilar artery: Normal. --Superior cerebellar arteries: Normal. --Posterior cerebral arteries: Normal. The right PCA is predominantly supplied by the posterior communicating artery. ANTERIOR CIRCULATION: --Intracranial internal carotid arteries: Atherosclerotic calcification of the internal carotid arteries at the skull base without hemodynamically significant stenosis. --Anterior cerebral arteries (ACA): Normal. --Middle cerebral arteries (MCA): Normal. Patent stent in the left cervical ICA. Venous sinuses: As permitted by contrast timing, patent. Review of the MIP images confirms the above findings. IMPRESSION: 1. Unchanged size of intraparenchymal hematoma in the left temporal lobe with progressive decrease of density. No residual subarachnoid blood is visible. 2. No intracranial aneurysm or vascular lesion. 3. Patent stent in the left cervical ICA. Electronically Signed   By: Deatra Robinson M.D.   On: 02/18/2023 22:24   CT HEAD WO CONTRAST ( )  Result Date: 02/09/2023 CLINICAL DATA:  87 year old female status post code stroke presentation on 02/06/2023 with left ICA ELVO. Treated with endovascular reperfusion. Left cerebral infarcts, predominantly the MCA territory. EXAM: CT HEAD WITHOUT CONTRAST TECHNIQUE: Contiguous axial images were obtained from the base of the skull through the vertex without intravenous contrast. RADIATION DOSE REDUCTION: This exam was performed according to the departmental dose-optimization program which includes automated exposure control, adjustment of the mA and/or kV according to patient size and/or use of iterative reconstruction technique. COMPARISON:  Post treatment MRI and MRA 02/07/2023. Head CT 02/07/2023 and earlier. FINDINGS: Brain: Oval hyperdense intra-axial hemorrhage in the left temporal lobe underlying the middle and superior temporal gyri (coronal image 31). Hyperdense blood products are 24 x 18 x 21 mm (AP  by transverse by CC), stable since 02/07/2023 estimated intra-axial volume 4-5 mL (Heidelberg classification 1c: PH1, hematoma within infarcted tissue, occupying <30%, no substantive mass effect). Small volume subarachnoid hemorrhage (Heidelberg classification 3c: Subarachnoid hemorrhage). In the left sylvian fissure has regressed. No superimposed IVH. No ventriculomegaly. Confluent cytotoxic edema in the posterior left MCA territory  with only mild mass effect on the adjacent ventricle. Stable gray-white differentiation elsewhere. Basilar cisterns are patent. No new intracranial hemorrhage. No new cytotoxic edema Vascular: Calcified atherosclerosis at the skull base. Skull: No acute osseous abnormality identified. Sinuses/Orbits: Visualized paranasal sinuses and mastoids are stable and well aerated. Other: No acute orbit or scalp soft tissue finding. IMPRESSION: 1. Regression of left hemisphere subarachnoid hemorrhage. Otherwise stable since 02/07/2023 left temporal lobe intra-axial hematoma, left MCA cytotoxic edema. 2. No significant intracranial mass effect. No new intracranial abnormality. Electronically Signed   By: Odessa Fleming M.D.   On: 02/09/2023 06:14   IR PERCUTANEOUS ART THROMBECTOMY/INFUSION INTRACRANIAL INC DIAG ANGIO  Result Date: 02/08/2023 INDICATION: Crescendo left eye amaurosis fugax lasting minutes, increasing in frequency. 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 EXAM: 1. EMERGENT LARGE VESSEL OCCLUSION THROMBOLYSIS (anterior CIRCULATION) COMPARISON:  CT angiogram of the head and neck February 06, 2023. MEDICATIONS: Ancef 2 g IV antibiotic was administered within 1 hour of the procedure. ANESTHESIA/SEDATION: General anesthesia. CONTRAST:  Omnipaque 300 approximately 220 cc FLUOROSCOPY TIME:  Fluoroscopy Time: 53 minutes 30 seconds (7 6 mGy). COMPLICATIONS: None immediate. TECHNIQUE: Following a full explanation of the  procedure along with the potential associated complications, an informed witnessed consent was obtained. The risks of intracranial hemorrhage of 10%, worsening neurological deficit, ventilator dependency, death and inability to revascularize were all reviewed in detail with the patient. The patient was then put under general anesthesia by the Department of Anesthesiology at [facility]. The right groin was prepped and draped in the usual sterile fashion. Thereafter using modified Seldinger technique, transfemoral access into the right common femoral artery was obtained without difficulty. Over a 0.035 inch guidewire a 125 cm 5.5 French Simmons 2 support catheter inside of an 087 95 cm balloon guide catheter was advanced to the aortic arch region, and selectively positioned in the left common carotid artery. FINDINGS: The left common carotid arteriogram demonstrates the left external carotid artery and its major branches to be widely patent. The left internal carotid artery at the bulb has a stent with no evidence of a string sign on the delayed arterial phase. No reconstitution is seen of the left internal carotid artery in the cavernous segment from the external carotid artery branches. PROCEDURE: Through the balloon guide catheter in the left internal carotid artery bulb region, a 160 cm microcatheter was advanced over an 014 inch standard Aristotle micro guidewire with a moderate J configuration distal to the bulb. Using a torque device, the micro guidewire was gently manipulated with mild resistance through the occluded left internal carotid artery followed by the microcatheter without resistance to the proximal cavernous segment. The guidewire was removed. Good aspiration obtained from the hub of the microcatheter. A control arteriogram performed through this demonstrated copious amounts of filling defects extending from the paraophthalmic region to the carotid bulb. Also no evidence of antegrade flow  was noted in the supraclinoid left ICA or the left middle cerebral artery. The micro guidewire was then gently manipulated with a torque device and advanced into the left MCA M1 M2 region followed by the microcatheter. The guidewire was removed. Good aspiration obtained from the hub of the microcatheter which was then connected to continuous heparinized saline infusion. At this time a 4 mm x 40 mm Solitaire X retrieval device was advanced to the distal end of the microcatheter and positioned such the proximal portion  of the retriever was at the left internal carotid artery terminus. At this time, a [place] sterile 4 mm x 30 mm angioplasty balloon catheter which had been prepped with 50% contrast and 50% heparinized saline infusion was advanced with the retrieval pusher wire and positioned with its distal and proximal markers adequate distant from the site of the occlusion. Control angioplasty was then performed using micro inflation syringe device via micro tubing with the balloon being inflated twice each time for approximately a minute to 4 mm. The balloon was then deflated and retrieved proximally. A control arteriogram performed through the balloon guide catheter now demonstrated revascularization of the previously occluded proximal left internal carotid artery with minimal flow noted distally. The balloon guide catheter was advanced through the proximal left internal carotid artery into its middle third. This while aspiration was performed at the hub of the balloon guide catheter. The retriever was then gently retrieved into the balloon guide catheter as aspiration was being conducted and removed. The balloon guide catheter was retrieved just proximal to the internal carotid artery bulb. Copious amounts of clot were noted in the aspirate from the balloon guide catheter hub, and also the retriever and the Tuohy Borst. A control arteriogram performed through the balloon guide catheter now demonstrated brisk flow  through the previously occluded proximal left internal carotid artery distally to the petrous, cavernous and supraclinoid ICAs. The revascularized left middle cerebral artery demonstrated a TICI 3 revascularization. Flash filling was noted in the hypoplastic stenotic proximal left A1. Attempts were made with the Embotrap filter device to be advanced to the distal cervical left ICA. Tortuosity in the mid cervical left ICA provided significant resistance to the distal advancement of the filter device catheter. This was then retrieved and removed. Over an 014 inch 300 cm exchange micro guidewire, a 6/8 mm x 40 mm Xact stent delivery apparatus which had been retrogradely purged with heparinized saline infusion was advanced using the rapid exchange technique and positioned without difficulty across the stenosis and proximal left ICA. The during the deployment of the stent, aspiration was applied at the hub of the balloon guide catheter. Following deployment, a control arteriogram performed through the balloon guide catheter demonstrated excellent apposition and coverage of the diseased proximal left ICA. A control arteriogram performed through the stented left internal carotid artery demonstrated wide patency of the left internal carotid artery extra cranially and intracranially. However, now evident was nonocclusive filling defect within the distal M2 segment of the inferior division 055 132 Zoom aspiration catheter with an 021 160 cm microcatheter was advanced over an 018 inch Aristotle micro guidewire with a J configuration to the left middle cerebral artery inferior division. The micro guidewire was then gently advanced through the occluded distal M2 segment followed by the microcatheter. The guidewire was removed. Good aspiration obtained from the hub of the microcatheter. Following confirmation on safe positioning of the tip of the microcatheter, a 4 mm x 40 mm Solitaire X retrieval device was then deployed in the  usual manner covering the previous area of subtotal occlusion of the distal left MCA inferior division M2 segment. The Zoom aspiration catheter was advanced to the distal inferior division. Aspiration was then performed at the hub of the Zoom aspiration catheter for approximately 2 minutes, and also through the balloon guide catheter in the left common carotid artery. The combination of the retrieval device, and the Zoom aspiration catheter was then gently retrieved with the retriever being captured into the Zoom aspiration catheter  as the combination was withdrawn through the stented segment. A control arteriogram performed through the balloon guide catheter following flow reversal now demonstrated significantly improved flow through the previously occluded distal M2 segment with a TICI 2C revascularization. The inferior and posterior angular branch continued to demonstrate slow flow in its proximal 1/3. At this time, a Socrates 155 cm aspiration catheter was advanced over an 018 inch Aristotle micro guidewire with a moderate J configuration to the distal left M2 inferior division segment. The guidewire was then advanced without difficulty through the occluded segment followed by the Socrates aspiration catheter. The Socrates catheter was advanced distally as the wire was withdrawn. Aspiration was then applied at the hub of the Socrates guide catheter for approximately 2 minute. Following this, the aspiration catheter was removed. Arteriogram performed through the balloon guide catheter now demonstrated the revascularized previously noted inferior division continued to be a small non occlusive filling defects in the more distal segment. This remained stable over the next 10 minutes. A final control arteriogram performed through the balloon guide catheter in the left common carotid artery now demonstrated excellent flow through the stented left ICA proximally and through the left middle cerebral artery territory  with a TICI 2C revascularization. The balloon guide catheter was removed. A 5 [place] JB 1 diagnostic catheter was then advanced over an 035 inch guidewire and positioned in the right common carotid artery and the left vertebral artery. The right common carotid arteriogram demonstrates a stenosis of 50% at the origin of the right external carotid artery. The right internal carotid artery at the bulb to the cranial skull base is widely patent. The petrous, the cavernous and the supraclinoid right ICA demonstrate wide patency. The right middle and the right anterior cerebral artery opacify into the capillary and venous phases. A right posterior communicating artery is seen opacifying the right posterior cerebral artery distribution. Cross-filling via the anterior communicating artery of the left anterior cerebral A2 segment and distally is noted. The left vertebral artery origin is widely patent. Moderate tortuosity is noted in the proximal left vertebral artery. More distally, the vessel is seen to opacify to the cranial skull base. The left vertebrobasilar junction and the left posterior-inferior cerebellar artery demonstrate opacification. The basilar artery, the superior cerebellar arteries and the left posterior cerebral arteries opacify into the capillary and venous phases. Non visualization of the right posterior cerebral arteries due to inflow from the more dominant anterior circulation as described above. An 8 French Angio-Seal closure device was deployed for hemostasis for hemostasis at the right groin puncture site. Distal pulses remained present bilaterally unchanged from prior to the procedure. A flat panel CT of the brain demonstrated mild subarachnoid hyperattenuation in the left perisylvian and left anterior parietal convexity. No mass effect or shift was noted. The patient's general anesthesia was reversed, and patient was extubated. Upon recovery, the patient was noted to move her left side  spontaneously. Patient was able to move her right leg spontaneously, and barely able to lift her right arm. Pupils were 3 mm round regular reactive. Tongue was in the midline. The patient remained unresponsive to verbal instructions. She was then transferred to the neuro ICU for post revascularization care. The patient received 81 mg of a aspirin, and 90 mg of Brilinta in route from referring hospital. She received quite a bolus dose of cangrelor, and a quarter does infusion which was stopped after about 2 hours. 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 Status post revascularization of occluded left internal carotid artery intracranially and extra cranially with 1 pass with a a 4 mm x 40 mm Solitaire X retrieval device contact aspiration achieving a TICI 3 revascularization of the left middle cerebral artery distribution. Status post revascularization of occluded distal M2 segment of the inferior division with 1 pass with a 4 mm x 40 mm Solitaire X retrieval device, and contact aspiration, and 1 pass with contact with an Socrates aspiration catheter achieving a TICI 2C revascularization. PLAN: Follow-up in the clinic 2-4 weeks post discharge. Electronically Signed   By: Julieanne Cotton M.D.   On: 02/08/2023 10:37   IR ANGIO INTRA EXTRACRAN SEL COM CAROTID INNOMINATE UNI R MOD SED  Result Date: 02/08/2023 INDICATION: Crescendo left eye amaurosis fugax lasting minutes, increasing in frequency. 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 EXAM: 1. EMERGENT LARGE VESSEL OCCLUSION THROMBOLYSIS (anterior CIRCULATION) COMPARISON:  CT angiogram of the head and neck February 06, 2023. MEDICATIONS: Ancef 2 g IV antibiotic was administered within 1 hour of the procedure. ANESTHESIA/SEDATION: General  anesthesia. CONTRAST:  Omnipaque 300 approximately 220 cc FLUOROSCOPY TIME:  Fluoroscopy Time: 53 minutes 30 seconds (7 6 mGy). COMPLICATIONS: None immediate. TECHNIQUE: Following a full explanation of the procedure along with the potential associated complications, an informed witnessed consent was obtained. The risks of intracranial hemorrhage of 10%, worsening neurological deficit, ventilator dependency, death and inability to revascularize were all reviewed in detail with the patient. The patient was then put under general anesthesia by the Department of Anesthesiology at [facility]. The right groin was prepped and draped in the usual sterile fashion. Thereafter using modified Seldinger technique, transfemoral access into the right common femoral artery was obtained without difficulty. Over a 0.035 inch guidewire a 125 cm 5.5 French Simmons 2 support catheter inside of an 087 95 cm balloon guide catheter was advanced to the aortic arch region, and selectively positioned in the left common carotid artery. FINDINGS: The left common carotid arteriogram demonstrates the left external carotid artery and its major branches to be widely patent. The left internal carotid artery at the bulb has a stent with no evidence of a string sign on the delayed arterial phase. No reconstitution is seen of the left internal carotid artery in the cavernous segment from the external carotid artery branches. PROCEDURE: Through the balloon guide catheter in the left internal carotid artery bulb region, a 160 cm microcatheter was advanced over an 014 inch standard Aristotle micro guidewire with a moderate J configuration distal to the bulb. Using a torque device, the micro guidewire was gently manipulated with mild resistance through the occluded left internal carotid artery followed by the microcatheter without resistance to the proximal cavernous segment. The guidewire was removed. Good aspiration obtained from the hub of the  microcatheter. A control arteriogram performed through this demonstrated copious amounts of filling defects extending from the paraophthalmic region to the carotid bulb. Also no evidence of antegrade flow was noted in the supraclinoid left ICA or the left middle cerebral artery. The micro guidewire was then gently manipulated with a torque device and advanced into the left MCA M1 M2 region followed by the microcatheter. The guidewire was removed. Good aspiration obtained from the hub of the microcatheter which was then connected to continuous heparinized saline infusion. At this time a 4 mm x 40  mm Solitaire X retrieval device was advanced to the distal end of the microcatheter and positioned such the proximal portion of the retriever was at the left internal carotid artery terminus. At this time, a [place] sterile 4 mm x 30 mm angioplasty balloon catheter which had been prepped with 50% contrast and 50% heparinized saline infusion was advanced with the retrieval pusher wire and positioned with its distal and proximal markers adequate distant from the site of the occlusion. Control angioplasty was then performed using micro inflation syringe device via micro tubing with the balloon being inflated twice each time for approximately a minute to 4 mm. The balloon was then deflated and retrieved proximally. A control arteriogram performed through the balloon guide catheter now demonstrated revascularization of the previously occluded proximal left internal carotid artery with minimal flow noted distally. The balloon guide catheter was advanced through the proximal left internal carotid artery into its middle third. This while aspiration was performed at the hub of the balloon guide catheter. The retriever was then gently retrieved into the balloon guide catheter as aspiration was being conducted and removed. The balloon guide catheter was retrieved just proximal to the internal carotid artery bulb. Copious amounts of clot  were noted in the aspirate from the balloon guide catheter hub, and also the retriever and the Tuohy Borst. A control arteriogram performed through the balloon guide catheter now demonstrated brisk flow through the previously occluded proximal left internal carotid artery distally to the petrous, cavernous and supraclinoid ICAs. The revascularized left middle cerebral artery demonstrated a TICI 3 revascularization. Flash filling was noted in the hypoplastic stenotic proximal left A1. Attempts were made with the Embotrap filter device to be advanced to the distal cervical left ICA. Tortuosity in the mid cervical left ICA provided significant resistance to the distal advancement of the filter device catheter. This was then retrieved and removed. Over an 014 inch 300 cm exchange micro guidewire, a 6/8 mm x 40 mm Xact stent delivery apparatus which had been retrogradely purged with heparinized saline infusion was advanced using the rapid exchange technique and positioned without difficulty across the stenosis and proximal left ICA. The during the deployment of the stent, aspiration was applied at the hub of the balloon guide catheter. Following deployment, a control arteriogram performed through the balloon guide catheter demonstrated excellent apposition and coverage of the diseased proximal left ICA. A control arteriogram performed through the stented left internal carotid artery demonstrated wide patency of the left internal carotid artery extra cranially and intracranially. However, now evident was nonocclusive filling defect within the distal M2 segment of the inferior division 055 132 Zoom aspiration catheter with an 021 160 cm microcatheter was advanced over an 018 inch Aristotle micro guidewire with a J configuration to the left middle cerebral artery inferior division. The micro guidewire was then gently advanced through the occluded distal M2 segment followed by the microcatheter. The guidewire was removed.  Good aspiration obtained from the hub of the microcatheter. Following confirmation on safe positioning of the tip of the microcatheter, a 4 mm x 40 mm Solitaire X retrieval device was then deployed in the usual manner covering the previous area of subtotal occlusion of the distal left MCA inferior division M2 segment. The Zoom aspiration catheter was advanced to the distal inferior division. Aspiration was then performed at the hub of the Zoom aspiration catheter for approximately 2 minutes, and also through the balloon guide catheter in the left common carotid artery. The combination of the retrieval  device, and the Zoom aspiration catheter was then gently retrieved with the retriever being captured into the Zoom aspiration catheter as the combination was withdrawn through the stented segment. A control arteriogram performed through the balloon guide catheter following flow reversal now demonstrated significantly improved flow through the previously occluded distal M2 segment with a TICI 2C revascularization. The inferior and posterior angular branch continued to demonstrate slow flow in its proximal 1/3. At this time, a Socrates 155 cm aspiration catheter was advanced over an 018 inch Aristotle micro guidewire with a moderate J configuration to the distal left M2 inferior division segment. The guidewire was then advanced without difficulty through the occluded segment followed by the Socrates aspiration catheter. The Socrates catheter was advanced distally as the wire was withdrawn. Aspiration was then applied at the hub of the Socrates guide catheter for approximately 2 minute. Following this, the aspiration catheter was removed. Arteriogram performed through the balloon guide catheter now demonstrated the revascularized previously noted inferior division continued to be a small non occlusive filling defects in the more distal segment. This remained stable over the next 10 minutes. A final control arteriogram  performed through the balloon guide catheter in the left common carotid artery now demonstrated excellent flow through the stented left ICA proximally and through the left middle cerebral artery territory with a TICI 2C revascularization. The balloon guide catheter was removed. A 5 [place] JB 1 diagnostic catheter was then advanced over an 035 inch guidewire and positioned in the right common carotid artery and the left vertebral artery. The right common carotid arteriogram demonstrates a stenosis of 50% at the origin of the right external carotid artery. The right internal carotid artery at the bulb to the cranial skull base is widely patent. The petrous, the cavernous and the supraclinoid right ICA demonstrate wide patency. The right middle and the right anterior cerebral artery opacify into the capillary and venous phases. A right posterior communicating artery is seen opacifying the right posterior cerebral artery distribution. Cross-filling via the anterior communicating artery of the left anterior cerebral A2 segment and distally is noted. The left vertebral artery origin is widely patent. Moderate tortuosity is noted in the proximal left vertebral artery. More distally, the vessel is seen to opacify to the cranial skull base. The left vertebrobasilar junction and the left posterior-inferior cerebellar artery demonstrate opacification. The basilar artery, the superior cerebellar arteries and the left posterior cerebral arteries opacify into the capillary and venous phases. Non visualization of the right posterior cerebral arteries due to inflow from the more dominant anterior circulation as described above. An 8 French Angio-Seal closure device was deployed for hemostasis for hemostasis at the right groin puncture site. Distal pulses remained present bilaterally unchanged from prior to the procedure. A flat panel CT of the brain demonstrated mild subarachnoid hyperattenuation in the left perisylvian and left  anterior parietal convexity. No mass effect or shift was noted. The patient's general anesthesia was reversed, and patient was extubated. Upon recovery, the patient was noted to move her left side spontaneously. Patient was able to move her right leg spontaneously, and barely able to lift her right arm. Pupils were 3 mm round regular reactive. Tongue was in the midline. The patient remained unresponsive to verbal instructions. She was then transferred to the neuro ICU for post revascularization care. The patient received 81 mg of a aspirin, and 90 mg of Brilinta in route from referring hospital. She received quite a bolus dose of cangrelor, and a quarter does  infusion which was stopped after about 2 hours. 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 Status post revascularization of occluded left internal carotid artery intracranially and extra cranially with 1 pass with a a 4 mm x 40 mm Solitaire X retrieval device contact aspiration achieving a TICI 3 revascularization of the left middle cerebral artery distribution. Status post revascularization of occluded distal M2 segment of the inferior division with 1 pass with a 4 mm x 40 mm Solitaire X retrieval device, and contact aspiration, and 1 pass with contact with an Socrates aspiration catheter achieving a TICI 2C revascularization. PLAN: Follow-up in the clinic 2-4 weeks post discharge. Electronically Signed   By: Julieanne Cotton M.D.   On: 02/08/2023 10:37   IR ANGIO VERTEBRAL SEL SUBCLAVIAN INNOMINATE UNI L MOD SED  Result Date: 02/08/2023 INDICATION: Crescendo left eye amaurosis fugax lasting minutes, increasing in frequency. 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 EXAM: 1. EMERGENT LARGE VESSEL OCCLUSION THROMBOLYSIS (anterior  CIRCULATION) COMPARISON:  CT angiogram of the head and neck February 06, 2023. MEDICATIONS: Ancef 2 g IV antibiotic was administered within 1 hour of the procedure. ANESTHESIA/SEDATION: General anesthesia. CONTRAST:  Omnipaque 300 approximately 220 cc FLUOROSCOPY TIME:  Fluoroscopy Time: 53 minutes 30 seconds (7 6 mGy). COMPLICATIONS: None immediate. TECHNIQUE: Following a full explanation of the procedure along with the potential associated complications, an informed witnessed consent was obtained. The risks of intracranial hemorrhage of 10%, worsening neurological deficit, ventilator dependency, death and inability to revascularize were all reviewed in detail with the patient. The patient was then put under general anesthesia by the Department of Anesthesiology at [facility]. The right groin was prepped and draped in the usual sterile fashion. Thereafter using modified Seldinger technique, transfemoral access into the right common femoral artery was obtained without difficulty. Over a 0.035 inch guidewire a 125 cm 5.5 French Simmons 2 support catheter inside of an 087 95 cm balloon guide catheter was advanced to the aortic arch region, and selectively positioned in the left common carotid artery. FINDINGS: The left common carotid arteriogram demonstrates the left external carotid artery and its major branches to be widely patent. The left internal carotid artery at the bulb has a stent with no evidence of a string sign on the delayed arterial phase. No reconstitution is seen of the left internal carotid artery in the cavernous segment from the external carotid artery branches. PROCEDURE: Through the balloon guide catheter in the left internal carotid artery bulb region, a 160 cm microcatheter was advanced over an 014 inch standard Aristotle micro guidewire with a moderate J configuration distal to the bulb. Using a torque device, the micro guidewire was gently manipulated with mild resistance through the  occluded left internal carotid artery followed by the microcatheter without resistance to the proximal cavernous segment. The guidewire was removed. Good aspiration obtained from the hub of the microcatheter. A control arteriogram performed through this demonstrated copious amounts of filling defects extending from the paraophthalmic region to the carotid bulb. Also no evidence of antegrade flow was noted in the supraclinoid left ICA or the left middle cerebral artery. The micro guidewire was then gently manipulated with a torque device and advanced into the left MCA M1 M2 region followed by the microcatheter. The guidewire was removed. Good aspiration obtained from the hub of the  microcatheter which was then connected to continuous heparinized saline infusion. At this time a 4 mm x 40 mm Solitaire X retrieval device was advanced to the distal end of the microcatheter and positioned such the proximal portion of the retriever was at the left internal carotid artery terminus. At this time, a [place] sterile 4 mm x 30 mm angioplasty balloon catheter which had been prepped with 50% contrast and 50% heparinized saline infusion was advanced with the retrieval pusher wire and positioned with its distal and proximal markers adequate distant from the site of the occlusion. Control angioplasty was then performed using micro inflation syringe device via micro tubing with the balloon being inflated twice each time for approximately a minute to 4 mm. The balloon was then deflated and retrieved proximally. A control arteriogram performed through the balloon guide catheter now demonstrated revascularization of the previously occluded proximal left internal carotid artery with minimal flow noted distally. The balloon guide catheter was advanced through the proximal left internal carotid artery into its middle third. This while aspiration was performed at the hub of the balloon guide catheter. The retriever was then gently retrieved  into the balloon guide catheter as aspiration was being conducted and removed. The balloon guide catheter was retrieved just proximal to the internal carotid artery bulb. Copious amounts of clot were noted in the aspirate from the balloon guide catheter hub, and also the retriever and the Tuohy Borst. A control arteriogram performed through the balloon guide catheter now demonstrated brisk flow through the previously occluded proximal left internal carotid artery distally to the petrous, cavernous and supraclinoid ICAs. The revascularized left middle cerebral artery demonstrated a TICI 3 revascularization. Flash filling was noted in the hypoplastic stenotic proximal left A1. Attempts were made with the Embotrap filter device to be advanced to the distal cervical left ICA. Tortuosity in the mid cervical left ICA provided significant resistance to the distal advancement of the filter device catheter. This was then retrieved and removed. Over an 014 inch 300 cm exchange micro guidewire, a 6/8 mm x 40 mm Xact stent delivery apparatus which had been retrogradely purged with heparinized saline infusion was advanced using the rapid exchange technique and positioned without difficulty across the stenosis and proximal left ICA. The during the deployment of the stent, aspiration was applied at the hub of the balloon guide catheter. Following deployment, a control arteriogram performed through the balloon guide catheter demonstrated excellent apposition and coverage of the diseased proximal left ICA. A control arteriogram performed through the stented left internal carotid artery demonstrated wide patency of the left internal carotid artery extra cranially and intracranially. However, now evident was nonocclusive filling defect within the distal M2 segment of the inferior division 055 132 Zoom aspiration catheter with an 021 160 cm microcatheter was advanced over an 018 inch Aristotle micro guidewire with a J configuration to  the left middle cerebral artery inferior division. The micro guidewire was then gently advanced through the occluded distal M2 segment followed by the microcatheter. The guidewire was removed. Good aspiration obtained from the hub of the microcatheter. Following confirmation on safe positioning of the tip of the microcatheter, a 4 mm x 40 mm Solitaire X retrieval device was then deployed in the usual manner covering the previous area of subtotal occlusion of the distal left MCA inferior division M2 segment. The Zoom aspiration catheter was advanced to the distal inferior division. Aspiration was then performed at the hub of the Zoom aspiration catheter for approximately 2 minutes,  and also through the balloon guide catheter in the left common carotid artery. The combination of the retrieval device, and the Zoom aspiration catheter was then gently retrieved with the retriever being captured into the Zoom aspiration catheter as the combination was withdrawn through the stented segment. A control arteriogram performed through the balloon guide catheter following flow reversal now demonstrated significantly improved flow through the previously occluded distal M2 segment with a TICI 2C revascularization. The inferior and posterior angular branch continued to demonstrate slow flow in its proximal 1/3. At this time, a Socrates 155 cm aspiration catheter was advanced over an 018 inch Aristotle micro guidewire with a moderate J configuration to the distal left M2 inferior division segment. The guidewire was then advanced without difficulty through the occluded segment followed by the Socrates aspiration catheter. The Socrates catheter was advanced distally as the wire was withdrawn. Aspiration was then applied at the hub of the Socrates guide catheter for approximately 2 minute. Following this, the aspiration catheter was removed. Arteriogram performed through the balloon guide catheter now demonstrated the revascularized  previously noted inferior division continued to be a small non occlusive filling defects in the more distal segment. This remained stable over the next 10 minutes. A final control arteriogram performed through the balloon guide catheter in the left common carotid artery now demonstrated excellent flow through the stented left ICA proximally and through the left middle cerebral artery territory with a TICI 2C revascularization. The balloon guide catheter was removed. A 5 [place] JB 1 diagnostic catheter was then advanced over an 035 inch guidewire and positioned in the right common carotid artery and the left vertebral artery. The right common carotid arteriogram demonstrates a stenosis of 50% at the origin of the right external carotid artery. The right internal carotid artery at the bulb to the cranial skull base is widely patent. The petrous, the cavernous and the supraclinoid right ICA demonstrate wide patency. The right middle and the right anterior cerebral artery opacify into the capillary and venous phases. A right posterior communicating artery is seen opacifying the right posterior cerebral artery distribution. Cross-filling via the anterior communicating artery of the left anterior cerebral A2 segment and distally is noted. The left vertebral artery origin is widely patent. Moderate tortuosity is noted in the proximal left vertebral artery. More distally, the vessel is seen to opacify to the cranial skull base. The left vertebrobasilar junction and the left posterior-inferior cerebellar artery demonstrate opacification. The basilar artery, the superior cerebellar arteries and the left posterior cerebral arteries opacify into the capillary and venous phases. Non visualization of the right posterior cerebral arteries due to inflow from the more dominant anterior circulation as described above. An 8 French Angio-Seal closure device was deployed for hemostasis for hemostasis at the right groin puncture site.  Distal pulses remained present bilaterally unchanged from prior to the procedure. A flat panel CT of the brain demonstrated mild subarachnoid hyperattenuation in the left perisylvian and left anterior parietal convexity. No mass effect or shift was noted. The patient's general anesthesia was reversed, and patient was extubated. Upon recovery, the patient was noted to move her left side spontaneously. Patient was able to move her right leg spontaneously, and barely able to lift her right arm. Pupils were 3 mm round regular reactive. Tongue was in the midline. The patient remained unresponsive to verbal instructions. She was then transferred to the neuro ICU for post revascularization care. The patient received 81 mg of a aspirin, and 90 mg of  Brilinta in route from referring hospital. She received quite a bolus dose of cangrelor, and a quarter does infusion which was stopped after about 2 hours. 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 Status post revascularization of occluded left internal carotid artery intracranially and extra cranially with 1 pass with a a 4 mm x 40 mm Solitaire X retrieval device contact aspiration achieving a TICI 3 revascularization of the left middle cerebral artery distribution. Status post revascularization of occluded distal M2 segment of the inferior division with 1 pass with a 4 mm x 40 mm Solitaire X retrieval device, and contact aspiration, and 1 pass with contact with an Socrates aspiration catheter achieving a TICI 2C revascularization. PLAN: Follow-up in the clinic 2-4 weeks post discharge. Electronically Signed   By: Julieanne Cotton M.D.   On: 02/08/2023 10:37   IR CT Head Ltd  Result Date: 02/08/2023 INDICATION: Crescendo left eye amaurosis fugax lasting minutes, increasing in frequency. Occluded left internal carotid artery  proximally at the bulb to the cavernous left ICA on CT angiogram of the head and neck. EXAM: 1. EMERGENT LARGE VESSEL OCCLUSION THROMBOLYSIS (anterior CIRCULATION) COMPARISON:  CT angiogram of the head and neck February 06, 2023. MEDICATIONS: Ancef 2 g IV antibiotic was administered within 1 hour of the procedure. ANESTHESIA/SEDATION: General anesthesia. CONTRAST:  Omnipaque 300 approximately 220 cc FLUOROSCOPY TIME:  Fluoroscopy Time: 53 minutes 30 seconds (7 6 mGy). COMPLICATIONS: None immediate. TECHNIQUE: Following a full explanation of the procedure along with the potential associated complications, an informed witnessed consent was obtained. The risks of intracranial hemorrhage of 10%, worsening neurological deficit, ventilator dependency, death and inability to revascularize were all reviewed in detail with the patient. The patient was then put under general anesthesia by the Department of Anesthesiology at [facility]. The right groin was prepped and draped in the usual sterile fashion. Thereafter using modified Seldinger technique, transfemoral access into the right common femoral artery was obtained without difficulty. Over a 0.035 inch guidewire a 125 cm 5.5 French Simmons 2 support catheter inside of an 087 95 cm balloon guide catheter was advanced to the aortic arch region, and selectively positioned in the left common carotid artery. FINDINGS: The left common carotid arteriogram demonstrates the left external carotid artery and its major branches to be widely patent. The left internal carotid artery at the bulb has a stent with no evidence of a string sign on the delayed arterial phase. No reconstitution is seen of the left internal carotid artery in the cavernous segment from the external carotid artery branches. PROCEDURE: Through the balloon guide catheter in the left internal carotid artery bulb region, a 160 cm microcatheter was advanced over an 014 inch standard Aristotle micro guidewire  with a moderate J configuration distal to the bulb. Using a torque device, the micro guidewire was gently manipulated with mild resistance through the occluded left internal carotid artery followed by the microcatheter without resistance to the proximal cavernous segment. The guidewire was removed. Good aspiration obtained from the hub of the microcatheter. A control arteriogram performed through this demonstrated copious amounts of filling defects extending from the paraophthalmic region to the carotid bulb. Also no evidence of antegrade flow was noted in the supraclinoid left ICA or the left middle cerebral artery. The micro guidewire was then gently manipulated with a torque device and advanced into the left MCA M1 M2 region followed by the microcatheter.  The guidewire was removed. Good aspiration obtained from the hub of the microcatheter which was then connected to continuous heparinized saline infusion. At this time a 4 mm x 40 mm Solitaire X retrieval device was advanced to the distal end of the microcatheter and positioned such the proximal portion of the retriever was at the left internal carotid artery terminus. At this time, a [place] sterile 4 mm x 30 mm angioplasty balloon catheter which had been prepped with 50% contrast and 50% heparinized saline infusion was advanced with the retrieval pusher wire and positioned with its distal and proximal markers adequate distant from the site of the occlusion. Control angioplasty was then performed using micro inflation syringe device via micro tubing with the balloon being inflated twice each time for approximately a minute to 4 mm. The balloon was then deflated and retrieved proximally. A control arteriogram performed through the balloon guide catheter now demonstrated revascularization of the previously occluded proximal left internal carotid artery with minimal flow noted distally. The balloon guide catheter was advanced through the proximal left internal  carotid artery into its middle third. This while aspiration was performed at the hub of the balloon guide catheter. The retriever was then gently retrieved into the balloon guide catheter as aspiration was being conducted and removed. The balloon guide catheter was retrieved just proximal to the internal carotid artery bulb. Copious amounts of clot were noted in the aspirate from the balloon guide catheter hub, and also the retriever and the Tuohy Borst. A control arteriogram performed through the balloon guide catheter now demonstrated brisk flow through the previously occluded proximal left internal carotid artery distally to the petrous, cavernous and supraclinoid ICAs. The revascularized left middle cerebral artery demonstrated a TICI 3 revascularization. Flash filling was noted in the hypoplastic stenotic proximal left A1. Attempts were made with the Embotrap filter device to be advanced to the distal cervical left ICA. Tortuosity in the mid cervical left ICA provided significant resistance to the distal advancement of the filter device catheter. This was then retrieved and removed. Over an 014 inch 300 cm exchange micro guidewire, a 6/8 mm x 40 mm Xact stent delivery apparatus which had been retrogradely purged with heparinized saline infusion was advanced using the rapid exchange technique and positioned without difficulty across the stenosis and proximal left ICA. The during the deployment of the stent, aspiration was applied at the hub of the balloon guide catheter. Following deployment, a control arteriogram performed through the balloon guide catheter demonstrated excellent apposition and coverage of the diseased proximal left ICA. A control arteriogram performed through the stented left internal carotid artery demonstrated wide patency of the left internal carotid artery extra cranially and intracranially. However, now evident was nonocclusive filling defect within the distal M2 segment of the inferior  division 055 132 Zoom aspiration catheter with an 021 160 cm microcatheter was advanced over an 018 inch Aristotle micro guidewire with a J configuration to the left middle cerebral artery inferior division. The micro guidewire was then gently advanced through the occluded distal M2 segment followed by the microcatheter. The guidewire was removed. Good aspiration obtained from the hub of the microcatheter. Following confirmation on safe positioning of the tip of the microcatheter, a 4 mm x 40 mm Solitaire X retrieval device was then deployed in the usual manner covering the previous area of subtotal occlusion of the distal left MCA inferior division M2 segment. The Zoom aspiration catheter was advanced to the distal inferior division. Aspiration was then performed  at the hub of the Zoom aspiration catheter for approximately 2 minutes, and also through the balloon guide catheter in the left common carotid artery. The combination of the retrieval device, and the Zoom aspiration catheter was then gently retrieved with the retriever being captured into the Zoom aspiration catheter as the combination was withdrawn through the stented segment. A control arteriogram performed through the balloon guide catheter following flow reversal now demonstrated significantly improved flow through the previously occluded distal M2 segment with a TICI 2C revascularization. The inferior and posterior angular branch continued to demonstrate slow flow in its proximal 1/3. At this time, a Socrates 155 cm aspiration catheter was advanced over an 018 inch Aristotle micro guidewire with a moderate J configuration to the distal left M2 inferior division segment. The guidewire was then advanced without difficulty through the occluded segment followed by the Socrates aspiration catheter. The Socrates catheter was advanced distally as the wire was withdrawn. Aspiration was then applied at the hub of the Socrates guide catheter for approximately 2  minute. Following this, the aspiration catheter was removed. Arteriogram performed through the balloon guide catheter now demonstrated the revascularized previously noted inferior division continued to be a small non occlusive filling defects in the more distal segment. This remained stable over the next 10 minutes. A final control arteriogram performed through the balloon guide catheter in the left common carotid artery now demonstrated excellent flow through the stented left ICA proximally and through the left middle cerebral artery territory with a TICI 2C revascularization. The balloon guide catheter was removed. A 5 [place] JB 1 diagnostic catheter was then advanced over an 035 inch guidewire and positioned in the right common carotid artery and the left vertebral artery. The right common carotid arteriogram demonstrates a stenosis of 50% at the origin of the right external carotid artery. The right internal carotid artery at the bulb to the cranial skull base is widely patent. The petrous, the cavernous and the supraclinoid right ICA demonstrate wide patency. The right middle and the right anterior cerebral artery opacify into the capillary and venous phases. A right posterior communicating artery is seen opacifying the right posterior cerebral artery distribution. Cross-filling via the anterior communicating artery of the left anterior cerebral A2 segment and distally is noted. The left vertebral artery origin is widely patent. Moderate tortuosity is noted in the proximal left vertebral artery. More distally, the vessel is seen to opacify to the cranial skull base. The left vertebrobasilar junction and the left posterior-inferior cerebellar artery demonstrate opacification. The basilar artery, the superior cerebellar arteries and the left posterior cerebral arteries opacify into the capillary and venous phases. Non visualization of the right posterior cerebral arteries due to inflow from the more dominant  anterior circulation as described above. An 8 French Angio-Seal closure device was deployed for hemostasis for hemostasis at the right groin puncture site. Distal pulses remained present bilaterally unchanged from prior to the procedure. A flat panel CT of the brain demonstrated mild subarachnoid hyperattenuation in the left perisylvian and left anterior parietal convexity. No mass effect or shift was noted. The patient's general anesthesia was reversed, and patient was extubated. Upon recovery, the patient was noted to move her left side spontaneously. Patient was able to move her right leg spontaneously, and barely able to lift her right arm. Pupils were 3 mm round regular reactive. Tongue was in the midline. The patient remained unresponsive to verbal instructions. She was then transferred to the neuro ICU for post revascularization care.  The patient received 81 mg of a aspirin, and 90 mg of Brilinta in route from referring hospital. She received quite a bolus dose of cangrelor, and a quarter does infusion which was stopped after about 2 hours. 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 Status post revascularization of occluded left internal carotid artery intracranially and extra cranially with 1 pass with a a 4 mm x 40 mm Solitaire X retrieval device contact aspiration achieving a TICI 3 revascularization of the left middle cerebral artery distribution. Status post revascularization of occluded distal M2 segment of the inferior division with 1 pass with a 4 mm x 40 mm Solitaire X retrieval device, and contact aspiration, and 1 pass with contact with an Socrates aspiration catheter achieving a TICI 2C revascularization. PLAN: Follow-up in the clinic 2-4 weeks post discharge. Electronically Signed   By: Julieanne Cotton M.D.   On: 02/08/2023 10:37   CT HEAD WO  CONTRAST ( )  Addendum Date: 02/08/2023   ADDENDUM REPORT: 02/08/2023 01:11 ADDENDUM: The area of parenchymal hemorrhage is new compared to the prior CT but was present on the interval MRI and appears stable from that exam. These findings were discussed by telephone on 02/08/2023 at 1:11 am with provider SRISHTI BHAGAT . Electronically Signed   By: Wiliam Ke M.D.   On: 02/08/2023 01:11   Result Date: 02/08/2023 CLINICAL DATA:  Stroke, follow-up EXAM: CT HEAD WITHOUT CONTRAST TECHNIQUE: Contiguous axial images were obtained from the base of the skull through the vertex without intravenous contrast. RADIATION DOSE REDUCTION: This exam was performed according to the departmental dose-optimization program which includes automated exposure control, adjustment of the mA and/or kV according to patient size and/or use of iterative reconstruction technique. COMPARISON:  02/07/2023 CT head 1:56 a.m., correlation is also made with 02/07/2023 MRI head FINDINGS: Brain: New area of parenchymal hemorrhage in the left temporal lobe, adjacent to the sylvian fissure, which measures approximately 2.0 x 1.3 x 1.5 cm (AP x TR x CC) (series 6, image 46 and series 5, image 38), with a volume of approximately 2 mL. Mild surrounding hypodensity, likely edema. Redemonstrated subarachnoid hemorrhage in the left sylvian fissure and along the left frontotemporal convexity. Hypodensity and loss of gray-white differentiation in the left posterior MCA territory (series 3, image 19), consistent with of all vein Ng infarct. No evidence of hemorrhage in this area. No significant mass effect or midline shift. No hydrocephalus. Vascular: No hyperdense vessel. Skull: Negative for fracture or focal lesion. Sinuses/Orbits: No acute finding. Other: The mastoid air cells are well aerated. IMPRESSION: 1. New area of parenchymal hemorrhage in the left temporal lobe, adjacent to the Sylvian fissure, which measures up to 2 cm, with a volume of  approximately 2 mL. Heidelberg classification 1c: PH1, hematoma within infarcted tissue, occupying <30%, no substantive mass effect. 2. Redemonstrated subarachnoid hemorrhage in the left Sylvian fissure and along the left frontotemporal convexity. 3. Hypodensity and loss of gray-white differentiation in the left posterior MCA territory, consistent with evolving acute infarct. No evidence of hemorrhage in this area. 4. No significant mass effect or midline shift. These results will be called to the ordering clinician or representative by the Radiologist Assistant, and communication documented in the PACS or [employer]. Electronically Signed: By: Wiliam Ke M.D. On: 02/08/2023 00:11   MR ANGIO HEAD WO CONTRAST  Result Date: 02/07/2023 CLINICAL DATA:  Stroke suspected EXAM: MRI HEAD WITHOUT  CONTRAST MRA HEAD WITHOUT CONTRAST TECHNIQUE: Multiplanar, multi-echo pulse sequences of the brain and surrounding structures were acquired without intravenous contrast. Angiographic images of the Circle of Willis were acquired using MRA technique without intravenous contrast. COMPARISON:  CTA head and neck angiogram 02/06/2023 FINDINGS: MRI HEAD FINDINGS Brain: Acute left MCA territory infarct involving large portions of the left parietal and temporal lobes, as well as the left frontal lobe in the opercular regions. Additional small acute infarcts are also present in the left cerebellum, left occipital lobe. Redemonstrated is subarachnoid hemorrhage in the left sylvian fissure and over the posterior left frontal lobe. Sequela of moderate chronic microvascular ischemic change. No hydrocephalus. Vascular: See below Skull and upper cervical spine: Normal marrow signal. Sinuses/Orbits: Trace bilateral mastoid effusions. No middle ear effusion. Paranasal sinuses are clear. Orbits are unremarkable. Other: None. MRA HEAD FINDINGS Anterior circulation: Flow signal is now seen in the cervical, petrous, cavernous, and  supraclinoid segments of the left ICA. Vessels in the left MCA territory appear asymmetrically small in size compared to the right. There is a small filling defect of the petrous segment of the left ICA (series 9, image 52). Posterior circulation: Normal Anatomic variants: IMPRESSION: 1. Acute left MCA territory infarct involving large portions of the left parietal and temporal lobes, and to a lesser degree the left frontal lobe and insula. 2. Additional small acute infarcts are also present in the left cerebellum and the left occipital lobe, which raises the possibility for a central embolic phenomenon. 3. Redemonstrated subarachnoid hemorrhage in the left Sylvian fissure and over the posterior left frontal lobe. 4. Flow signal is now seen in the cervical, petrous, cavernous, and supraclinoid segments of the left ICA. There is a small filling defect of the petrous segment of the left ICA, which could represent residual thrombus or possibly a dissection. Consider CTA head for further evaluation. These results will be called to the ordering clinician or representative by the Radiologist Assistant, and communication documented in the PACS or [employer]. Electronically Signed   By: Lorenza Cambridge M.D.   On: 02/07/2023 15:17   MR BRAIN WO CONTRAST  Result Date: 02/07/2023 CLINICAL DATA:  Stroke suspected EXAM: MRI HEAD WITHOUT CONTRAST MRA HEAD WITHOUT CONTRAST TECHNIQUE: Multiplanar, multi-echo pulse sequences of the brain and surrounding structures were acquired without intravenous contrast. Angiographic images of the Circle of Willis were acquired using MRA technique without intravenous contrast. COMPARISON:  CTA head and neck angiogram 02/06/2023 FINDINGS: MRI HEAD FINDINGS Brain: Acute left MCA territory infarct involving large portions of the left parietal and temporal lobes, as well as the left frontal lobe in the opercular regions. Additional small acute infarcts are also present in the left cerebellum,  left occipital lobe. Redemonstrated is subarachnoid hemorrhage in the left sylvian fissure and over the posterior left frontal lobe. Sequela of moderate chronic microvascular ischemic change. No hydrocephalus. Vascular: See below Skull and upper cervical spine: Normal marrow signal. Sinuses/Orbits: Trace bilateral mastoid effusions. No middle ear effusion. Paranasal sinuses are clear. Orbits are unremarkable. Other: None. MRA HEAD FINDINGS Anterior circulation: Flow signal is now seen in the cervical, petrous, cavernous, and supraclinoid segments of the left ICA. Vessels in the left MCA territory appear asymmetrically small in size compared to the right. There is a small filling defect of the petrous segment of the left ICA (series 9, image 52). Posterior circulation: Normal Anatomic variants: IMPRESSION: 1. Acute left MCA territory infarct involving large portions of the left parietal and temporal lobes, and  to a lesser degree the left frontal lobe and insula. 2. Additional small acute infarcts are also present in the left cerebellum and the left occipital lobe, which raises the possibility for a central embolic phenomenon. 3. Redemonstrated subarachnoid hemorrhage in the left Sylvian fissure and over the posterior left frontal lobe. 4. Flow signal is now seen in the cervical, petrous, cavernous, and supraclinoid segments of the left ICA. There is a small filling defect of the petrous segment of the left ICA, which could represent residual thrombus or possibly a dissection. Consider CTA head for further evaluation. These results will be called to the ordering clinician or representative by the Radiologist Assistant, and communication documented in the PACS or [employer]. Electronically Signed   By: Lorenza Cambridge M.D.   On: 02/07/2023 15:17   CT HEAD WO CONTRAST ( )  Result Date: 02/07/2023 CLINICAL DATA:  Stroke follow-up EXAM: CT HEAD WITHOUT CONTRAST TECHNIQUE: Contiguous axial images were obtained  from the base of the skull through the vertex without intravenous contrast. RADIATION DOSE REDUCTION: This exam was performed according to the departmental dose-optimization program which includes automated exposure control, adjustment of the mA and/or kV according to patient size and/or use of iterative reconstruction technique. COMPARISON:  Angiography 02/06/2023 FINDINGS: Brain: There is acute subarachnoid hemorrhage in the left sylvian fissure and over the left convexity. There is periventricular hypoattenuation compatible with chronic microvascular disease. Vascular: No abnormal hyperdensity of the major intracranial arteries or dural venous sinuses. No intracranial atherosclerosis. Skull: The visualized skull base, calvarium and extracranial soft tissues are normal. Sinuses/Orbits: No fluid levels or advanced mucosal thickening of the visualized paranasal sinuses. No mastoid or middle ear effusion. The orbits are normal. IMPRESSION: Acute subarachnoid hemorrhage in the left Sylvian fissure and over the left convexity. Unchanged compared to the intraprocedural CT obtained during the earlier procedure. Electronically Signed   By: Deatra Robinson M.D.   On: 02/07/2023 02:11   CT ANGIO HEAD NECK W WO CM  Result Date: 02/06/2023 CLINICAL DATA:  Transient monocular vision loss in the left RI EXAM: CT ANGIOGRAPHY HEAD AND NECK WITH AND WITHOUT CONTRAST TECHNIQUE: Multidetector CT imaging of the head and neck was performed using the standard protocol during bolus administration of intravenous contrast. Multiplanar CT image reconstructions and MIPs were obtained to evaluate the vascular anatomy. Carotid stenosis measurements (when applicable) are obtained utilizing NASCET criteria, using the distal internal carotid diameter as the denominator. RADIATION DOSE REDUCTION: This exam was performed according to the departmental dose-optimization program which includes automated exposure control, adjustment of the mA and/or  kV according to patient size and/or use of iterative reconstruction technique. CONTRAST:  75mL OMNIPAQUE IOHEXOL 350 MG/ML SOLN COMPARISON:  None Available. FINDINGS: CT HEAD FINDINGS Brain: No evidence of acute infarct, hemorrhage, mass, mass effect, or midline shift. No hydrocephalus or extra-axial fluid collection. Periventricular white matter changes, likely the sequela of chronic small vessel ischemic disease. Vascular: No hyperdense vessel. Skull: Negative for fracture or focal lesion. Sinuses/Orbits: No acute finding. Other: The mastoid air cells are well aerated. CTA NECK FINDINGS Aortic arch: Standard branching. Imaged portion shows no evidence of aneurysm or dissection. No significant stenosis of the major arch vessel origins. Mild aortic atherosclerosis. Right carotid system: No evidence of dissection, occlusion, or hemodynamically significant stenosis (greater than 50%). Retropharyngeal course of the mid right ICA Left carotid system: Occlusion of the left ICA just distal to its take-off (series 12, image 117 and series 11, image 116). No other significant stenosis  in the opacified portion of the left carotid. No evidence of dissection. Vertebral arteries: No evidence of dissection, occlusion, or hemodynamically significant stenosis (greater than 50%). Skeleton: No acute osseous abnormality. Degenerative changes in the cervical spine. Other neck: Negative. Upper chest: 3 mm subsolid nodule in the right upper lobe (series 11, image 34). Emphysema. Review of the MIP images confirms the above findings CTA HEAD FINDINGS Anterior circulation: Nonopacification of the left ICA until the distal cavernous segment, where there is minimal opacification, with more robust opacification in the supraclinoid segment, likely retrograde. There is opacification the ophthalmic artery. The right internal carotid artery is patent to the terminus without significant stenosis. A1 segments patent, hypoplastic on the left. Normal  anterior communicating artery. Anterior cerebral arteries are patent to their distal aspects without significant stenosis. No M1 stenosis or occlusion. MCA branches perfused to their distal aspects without significant stenosis. Posterior circulation: Vertebral arteries patent to the vertebrobasilar junction without significant stenosis. Posterior inferior cerebellar arteries patent proximally. Basilar patent to its distal aspect without significant stenosis. Superior cerebellar arteries patent proximally. Patent left P1. Fetal origin of the right PCA from the right posterior communicating artery. PCAs perfused to their distal aspects without significant stenosis. The bilateral posterior communicating arteries are patent. Venous sinuses: As permitted by contrast timing, patent. Anatomic variants: Fetal origin of the right PCA. Review of the MIP images confirms the above findings IMPRESSION: 1. 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 No other intracranial large vessel occlusion or significant stenosis. 3. No hemodynamically significant stenosis in the neck. 4. 3 mm subsolid nodule in the right upper lobe. No follow-up needed if patient is low-risk.This recommendation follows the consensus statement: Guidelines for Management of Incidental Pulmonary Nodules Detected on CT Images: From the Fleischner Society 2017; Radiology 2017; 284:228-243. 5. Aortic atherosclerosis. Aortic Atherosclerosis (ICD10-I70.0). These results were called by telephone at the time of interpretation on 02/06/2023 at 5:23 pm to provider [facility] , who verbally acknowledged these results. Electronically Signed   By: Wiliam Ke M.D.   On: 02/06/2023 17:24    Labs:  Basic Metabolic Panel: No results for input(s): "NA",  "K", "CL", "CO2", "GLUCOSE", "BUN", "CREATININE", "CALCIUM", "MG", "PHOS" in the last 168 hours.   CBC: No results for input(s): "WBC", "NEUTROABS", "HGB", "HCT", "MCV", "PLT" in the last 168 hours.  CBG: No results for input(s): "GLUCAP" in the last 168 hours.  Family history.  Negative for colon cancer esophageal cancer or rectal cancer  Brief HPI:   MAUNA GLAZEWSKI is a 87 y.o. right-handed female with history of atrial fibrillation on Eliquis and failed cardioversions, hypertension, bladder cancer status post TURBT 02/05/2023, diastolic congestive heart failure chronic venous insufficiency left breast cancer with chemotherapy mastectomy, remote head and neck radiation 1940s AJ, left thyroidectomy and inferior parathyroidectomy with chronic anemia.  Per chart review lives with spouse uses straight cane prior to admission.  Presented 02/06/2023 with intermittent transient left eye vision.  Cranial CT scan showed acute subarachnoid hemorrhage in the left sylvian fissure and over the left convexity.  MRA/MRI showed acute left MCA territory infarction involving large portions of the left left parietal and temporal lobes with additional small acute infarcts also present in the left cerebellum and left occipital lobe as well as redemonstrated subarachnoid hemorrhage and left sylvian fissure as well  as left ICA occlusion.  Interventional radiology follow-up for nonocclusive clot with carotid stenting.  Neurology follow-up maintained initially on low-dose aspirin and Brilinta twice daily and plan to resume Eliquis after SAH/ICH resolved in 7 to 10 days.  Lovenox initiated for DVT prophylaxis.  Therapy evaluations completed due to patient decreased functional mobility was admitted for a comprehensive rehab program.   Hospital Course: SUNDAI GOSHA was admitted to rehab 02/13/2023 for inpatient therapies to consist of PT, ST and OT at least three hours five days a week. Past admission physiatrist, therapy team  and rehab RN have worked together to provide customized collaborative inpatient rehab.  Pertaining to patient's large left MCA stroke/SAH due to left ICA occlusion and MCA nonocclusive clot status post stenting.  Initially on low-dose aspirin and Brilinta.  Follow-up cranial CT scan completed reviewed by neurology services and low-dose aspirin has since been discontinued Eliquis resumed she would continue on Brilinta with Eliquis until follow-up with neurology services.  Atrial fibrillation with history of failed cardioversions Lopressor as indicated cardiac rate controlled.  Eliquis has been resumed.  Follow-up outpatient cardiology services.  Diastolic congestive heart failure exhibiting no signs of fluid overload and patient remained on Jardiance.  Morbid obesity BMI 37.16 dietary follow-up.  Chronic anemia no bleeding episodes and monitored.  Prediabetes blood sugars discontinued as well as insulin with diet as advised.  Bouts of constipation resolved with laxative assistance.   Blood pressures were monitored on TID basis and controlled     Rehab course: During patient's stay in rehab weekly team conferences were held to monitor patient's progress, set goals and discuss barriers to discharge. At admission, patient required moderate assist 6 feet to person hand-held assist max assist step pivot transfers  Physical exam.  Blood pressure 132/74 pulse 79 temperature 99 respirations 18 oxygen saturation is 98% room air Constitutional.  No acute distress HEENT Head.  Mild right facial droop.  Tongue midline Eyes.  Pupils round and reactive to light no discharge without nystagmus Neck.  Supple nontender no JVD without thyromegaly Cardiac regular rate and rhythm without any extra sounds or murmur heard Abdomen.  Soft nontender positive bowel sounds without rebound Respiratory effort normal no respiratory distress without wheeze Musculoskeletal Comments.  Left upper extremity 5/5 in biceps triceps  wrist extension grip and FA Right upper extremity biceps 3/5 triceps 3/5 wrist extension 2/5 grip 2/5 and FA 2 -/5 Left lower extremity 5/5 and same muscles Right lower extremity hip flexors 2 -/5 knee extension knee flexion 3 -/5 dorsiflexion 3 -/5 and PF 3/5 Neurologic.  Patient alert right side neglect noted provides name and age.  Follows simple commands.  He/She  has had improvement in activity tolerance, balance, postural control as well as ability to compensate for deficits. He/She has had improvement in functional use RUE/LUE  and RLE/LLE as well as improvement in awareness.  Patient making appropriate progress.  Continues to be limited by right inattention and right side weakness.  Ambulates with a rolling walker for her assistive device.  Family teaching ongoing.  Patient also completed caregiver training for in room transfers and toilet transfers using rolling walker.  Patient ambulates to the therapy gym with rolling walker and contact-guard.  She needed minimal assist for turns with moderate cues to turn to the right.  Full family teaching completed plan discharge to home       Disposition: Discharge to home    Diet: Regular  Special Instructions: No driving smoking or alcohol  Medications at discharge 1.  Tylenol as needed 2.  Eliquis 5 mg p.o. twice daily 3.  Questran 4 mg p.o. daily 4.  Sensipar 30 mg p.o. daily 5.  Jardiance 10 mg p.o. daily 6.  Metanx 1 tablet daily 7.  Magnesium oxide 400 mg p.o. daily 8.  Lopressor 75 mg p.o. twice daily 9.  Ditropan XL 15 mg p.o. nightly 10.  Prilosec 40 mg twice daily 11.  Brilinta 45 mg p.o. twice daily 12.  Vitamin D 50,000 units every 7 days  30-35 minutes were spent completing discharge summary and discharge planning   Discharge Instructions     Ambulatory referral to Neurology   Complete by: As directed    An appointment is requested in approximately: 4 weeks left MCA infarction   Ambulatory referral to Physical  Medicine Rehab   Complete by: As directed    Moderate complexity follow-up 1 to 2 weeks left MCA infarction        Follow-up Information     Raulkar, Drema Pry, MD Follow up.   Specialty: Physical Medicine and Rehabilitation Why: office will call you to arrange your appt (sent) Contact information: [address] [place] [address] [phone]         Julieanne Cotton, MD Follow up.   Specialties: Interventional Radiology, Radiology Why: Call for appointment Contact information: [address] [place] [address] [phone]         [facility] Follow up.   Why: Call on Monday for appointment Contact information: [address]     [address] [place] [address] [phone]        Copland, Gwenlyn Found, MD Follow up.   Specialty: Family Medicine Why: Call the office on Monday for hospital follow-up appointment. Contact information: [address] [place] [address] [phone]                 Signed: Mcarthur Rossetti Avram Danielson 02/25/2023, 4:30 PM

## 2023-02-22 NOTE — Progress Notes (Signed)
Occupational Therapy Session Note  Patient Details  Name: Loretta Gates MRN: [id number] Date of Birth: 1936-04-16  Today's Date: 02/22/2023 OT Individual Time: 8657-8469 OT Individual Time Calculation (min): 40 min    Short Term Goals: Week 2:  OT Short Term Goal 1 (Week 2): LTG =STG 2/2 ELOS  Skilled Therapeutic Interventions/Progress Updates:     Pt received sitting up in chair with husband, Loretta Gates, present in room. Pt presenting to be in good spirits receptive to skilled OT session reporting 0/10 pain- OT offering intermittent rest breaks, repositioning, and therapeutic support to optimize participation in therapy session. Pt requesting to complete session outside- transported Pt total A to outdoor location in wc for time management and energy conservation. Session completed outside to boost Pt's moral and participation. Engaged pt in conversation regarding community mobility including barriers and environmental factors with Pt receptive and able to verbalize 3 environmental considerations demonstrating teach back as evidence of learning. Engaged Pt in simulated community mobility activities navigating around obstacles, over uneven terrance, and up/down inclines x2 trials. Pt able to complete with CGA and occasional min A for RW management when changing directions to R side. Pt requiring mod VB/tactile cueing to attend to R hand placement on RW, turn RW to R, and to avoid obstacles on R side. Pt transported back to room total A in wc. Facilitated Pt's husband, Loretta Gates, assisting Pt back to her chair for improved carryover upon d/c home. Pt's husband able to assist Pt back to chair safety with no safety concerns present. Pt was left resting in chair with call bell in reach, husband present in room and all needs met. No need for chair alarm as Pt's husband is trained in assisting Pt in the room.   Therapy Documentation Precautions:  Precautions Precautions: Fall Precaution Comments: R  hemi Restrictions Weight Bearing Restrictions: No  Therapy/Group: Individual Therapy  Army Fossa 02/22/2023, 1:02 PM

## 2023-02-22 NOTE — Progress Notes (Signed)
Speech Language Pathology Daily Session Note  Patient Details  Name: Loretta Gates MRN: [id number] Date of Birth: 11/15/1935  Today's Date: 02/22/2023 SLP Individual Time: 0800-0900 SLP Individual Time Calculation (min): 60 min  Short Term Goals: Week 1: SLP Short Term Goal 1 (Week 1): Patient will consume current diet without overt s/s of aspiration with supervision level verbal cues for use of swallowing compensatory strategies. SLP Short Term Goal 2 (Week 1): Patient will demonstrate sustained attention to functional tasks for 5 minutes with Mod verbal cues for redirection. SLP Short Term Goal 3 (Week 1): Patient will attend to right enviornment/body during functional tasks with Mod multimodal cues. SLP Short Term Goal 4 (Week 1): Patient will verbalize 2 cognitive deficits and 2 physical deficits with Mod A multimodal cues.  Skilled Therapeutic Interventions:  Pt was seen in am to address cognitive re- training. Pt was alert and seen I her room with dtr present and assisting with dressing. Pt appeared impulsive c/b attempting to transfer prior to receiving assist from her dtr. Pt was responsive to mod verbal cues for waiting until dtr present to assist. Educated pt on safety awareness and waiting to receive assistance with transfers. Pt was transitioned to ST office for continuation of session. Pt participated in functional tasks for attending to right side. Pt required sup A for identifying dates on right side of calendar and min A for reading her therapy schedule. Pt was also challenged in structured thought organization activity where she required sup to min A. Pt was challenged in attention task while simultaneously addressing working memory and R inattention through playing a card game. Pt sustained attention for ~7 minutes however requiring min A to recall rules of game which were not able to be faded and sup a for attention to right side. In final minutes of session, SLP addressed problem  solving through presenting visual stimulus. Pt required mod cues to identify problems and solutions with 70% acc. Pt was returned to her room with dtr present. SLP recounted session and provided instruction in continued cognitive task outside of sessions. SLP to continue POC  Pain Pain Assessment Pain Scale: 0-10 Pain Score: 0-No pain  Therapy/Group: Individual Therapy  Renaee Munda 02/22/2023, 12:27 PM

## 2023-02-22 NOTE — Progress Notes (Signed)
Physical Therapy Session Note  Patient Details  Name: Loretta Gates MRN: [id number] Date of Birth: 1936/01/07  Today's Date: 02/22/2023 PT Individual Time: 0920-1015 + 1130-1157 PT Individual Time Calculation (min): 55 min  + 27 min  Short Term Goals: Week 2:  PT Short Term Goal 1 (Week 2): STG = LTG  Skilled Therapeutic Interventions/Progress Updates:      1st session: Pt sitting in chair to start with daughter at bedside. Pt fully dressed and agreeable to therapy session. No reports of pain. Sit<>stand to RW with CGA. Ambulates from her room to main rehab gym, ~127ft, with supervision and RW!! Cues only for keeping body within walker frame, upright posture, and increasing gait speed as appropriate.  Stair training using 6inch steps and 2 hand rails - CGA for navigating x4 steps while forward facing, step to-pattern. Able to progress to 1 hand rail for additional x4 steps with CGA to simulate home setup. No knee buckling or LOB observed.   Worked on R attention by placing bright orange cones on hand rails in hallway - instructed patient to find and retrieve all cones that she saw. Patient retrieved 4/8 cones, all on the L side - missed all the cones on her R side. Explained to patient importance of using lighthouse technique for visual scanning.   Functional outcome measures assessed;: 5xSTS and TUG  5xSTS 21 seconds from lowered mat table, unsupported. Scores >15 seconds indicate increased falls risk.  TUG: 1 minute 25 seconds with RW and supervision. Scores >13.5 seconds indicate increased falls risk.   Pt returned to her room - assisted to chair and safety belt alarm applied as no family present. Call bell within reach and all needs met.       2nd session: Pt sitting in w/c to start - agreeable to therapy and denies any pain. Appears fatigued from therapies. Husband present who provided RW from home to assess fit (pt has been using youth sized RW in therapy). Personal RW  lowered to lowest height, only ~1inch higher than youth RW. Should be adequate for AD - CSW notified of no need to purchase youth RW.  Transported to day room rehab gym. Gait training with RW 139ft with CGA/supervision and RW - worked on turning sharp turns to the Right which she needed mod cues for. Begins to fade away from her RW as she becomes fatigued, cues for correcting.  Worked on visual scanning, selective attention in busy gym environment, and memory with card matching game which she did in standing with RW support and CGA. Pt initially needing max cues to locate and match cards but faded to min cues as she became familiar with task.   Returned to her room and assisted back into chair next to her husband - all needs met at end.      Therapy Documentation Precautions:  Precautions Precautions: Fall Precaution Comments: R hemi Restrictions Weight Bearing Restrictions: No General:     Therapy/Group: Individual Therapy  Orrin Brigham 02/22/2023, 7:39 AM

## 2023-02-22 NOTE — Progress Notes (Signed)
PROGRESS NOTE   Subjective/Complaints: Had 1 small BM this AM. Prefers no laxatives, discussed high fiber foods for constipation Very happy with d/c date on Sunday!  ROS: Patient denies fever, rash, sore throat, blurred vision, dizziness, nausea, vomiting, diarrhea, cough, shortness of breath or chest pain, joint or back/neck pain, headache, or mood change. +visual deficits, +visual scanning impairment   Objective:   No results found. No results for input(s): "WBC", "HGB", "HCT", "PLT" in the last 72 hours.  No results for input(s): "NA", "K", "CL", "CO2", "GLUCOSE", "BUN", "CREATININE", "CALCIUM" in the last 72 hours.    Intake/Output Summary (Last 24 hours) at 02/22/2023 1052 Last data filed at 02/21/2023 1852 Gross per 24 hour  Intake 360 ml  Output --  Net 360 ml        Physical Exam: Vital Signs Blood pressure 119/85, pulse 82, temperature (!) 97 F (36.1 C), resp. rate 16, height 4\' 11"  (1.499 m), weight 82.6 kg, SpO2 94 %. Constitutional: No distress . Vital signs reviewed. BMI 36.78 Gen: no distress, normal appearing HEENT: oral mucosa pink and moist, NCAT Cardio: Reg rate Chest: normal effort, normal rate of breathing Abd: soft, non-distended Ext: no edema Psych: pleasant, normal affect  Skin: IV RUE. Bruising RLQ, some moistened sl macerated sking in inguinal and abdominal folds on right. Maybe early candida.  Musc: Full ROM, No pain with AROM or PROM in the neck, trunk, or extremities. Posture appropriate  Neuro:         Comments: LUE 5/5 in Biceps, triceps, WE, grip and FA RUE- biceps 3+/5; Triceps 3+/5; WE 3+/5; grip 3+/5; and FA 3+/5 LLE- 5/5 in HF, KE, KF, DF and PF RLE- HF 3+5; KE/KF 3+/5; DF 3-+5 and PF 3+/5     Mental Status: She is alert. Improved recall    Comments: Patient is alert.  Noted right-sided inattention impoving!.    Decreased sensation to light touch on RUE/RLE L gaze preference   Expressive aphasia improving--language much more automatic and fluid Standing with supervision Ambulating with RW CG    Assessment/Plan: 1. Functional deficits which require 3+ hours per day of interdisciplinary therapy in a comprehensive inpatient rehab setting. Physiatrist is providing close team supervision and 24 hour management of active medical problems listed below. Physiatrist and rehab team continue to assess barriers to discharge/monitor patient progress toward functional and medical goals  Care Tool:  Bathing              Bathing assist Assist Level: Moderate Assistance - Patient 50 - 74%     Upper Body Dressing/Undressing Upper body dressing        Upper body assist Assist Level: Moderate Assistance - Patient 50 - 74%    Lower Body Dressing/Undressing Lower body dressing            Lower body assist Assist for lower body dressing: Moderate Assistance - Patient 50 - 74%     Toileting Toileting    Toileting assist Assist for toileting: Moderate Assistance - Patient 50 - 74%     Transfers Chair/bed transfer  Transfers assist  Chair/bed transfer activity did not occur: Safety/medical concerns  Chair/bed transfer assist level:  Minimal Assistance - Patient > 75%     Locomotion Ambulation   Ambulation assist      Assist level: Contact Guard/Touching assist Assistive device: Walker-rolling Max distance: 181ft   Walk 10 feet activity   Assist     Assist level: Contact Guard/Touching assist Assistive device: Walker-rolling   Walk 50 feet activity   Assist Walk 50 feet with 2 turns activity did not occur: Safety/medical concerns  Assist level: Contact Guard/Touching assist Assistive device: Walker-rolling    Walk 150 feet activity   Assist Walk 150 feet activity did not occur: Safety/medical concerns         Walk 10 feet on uneven surface  activity   Assist Walk 10 feet on uneven surfaces activity did not occur:  Safety/medical concerns         Wheelchair     Assist Is the patient using a wheelchair?: No Type of Wheelchair: Manual    Wheelchair assist level: Dependent - Patient 0%      Wheelchair 50 feet with 2 turns activity    Assist        Assist Level: Dependent - Patient 0%   Wheelchair 150 feet activity     Assist      Assist Level: Dependent - Patient 0%   Blood pressure 119/85, pulse 82, temperature (!) 97 F (36.1 C), resp. rate 16, height 4\' 11"  (1.499 m), weight 82.6 kg, SpO2 94 %.    Medical Problem List and Plan: 1. 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             -patient may  shower             -ELOS/Goals: 17-24 days min A to supervision  Grounds pass ordered  Homocysteine reviewed and is 11.7   Continue CIR therapies including PT, OT, and SLP. MHas made nice functional gains! 2.  Impaired mobility: continue SCDs             -antiplatelet therapy: Resuming eliquis along with Brilinta 45 mg twice daily.  Pt to follow up with neurology as outpt for further guidance with regimen , discussed with daughter.  3. Pain: N/a 4. Mood/Behavior/Sleep: Provide emotional support             -antipsychotic agents: Seroquel 25 mg nightly as needed 5. Neuropsych/cognition: This patient is? capable of making decisions on her own behalf. 6. Skin/Wound Care: Routine skin checks  -pt receiving barrier cream to inguinal folds  5/13-will add nystatin powder to inguinal and abdominal folds bid 7. Fluids/Electrolytes/Nutrition: Routine in and outs with follow-up chemistries 8.  Atrial fibrillation with history of failed cardioversion.  Continue Lopressor 75 mg twice daily.  Eliquis may be resumed after SAH/ICH resolved 7 to 10 days, -neuro has reviewed repeat CT angio Head  -Head CT demonstrates SAH resolved and IPH stable and evolving.  -Neuro recs resuming  Eliquis with  Brillinta until outpt follow up  9.  GERD.  Continue Protonix  10.  Diastolic congestive heart failure.  Monitor for any signs of fluid overload- Monitor daily weights.   11.  History of left breast cancer with chemotherapy and mastectomy.  Follow-up outpatient  12.  Morbid obesity.  BMI 37.16-->35.18-->36.78  Provided dietary education  13.  Chronic anemia. Hgb reviewed and has normalized  14. Prediabetes: d/ced CBGs and insulin and discussed that insulin is not recommended for her current CBG levels,  provided dietary education  Discussed that increased fiber intake can improve blood sugar control  15. Suboptimal magnesium: ordered 1 gram IV on 5/8. IV discontinued afterward since no more IV medications warranted. Discussed that we would not need repeat labs this admission  16. Expressive aphasia: commended on incredible improvements already! Continue SLP  17. Low protein levels: edited diet order to request that more protein be give with meals  18. Bleeding from Lovenox injection site: lovenox stopped. Eliquis resumed. Resolved  19. Impaired cognition: continue SLP, discussed impaired clock drawing test  20. Right truncal lean: discussed that right sided truncal muscles can also be weakened by stroke, encouraged continued sitting in chair rather than bed to strengthen truncal muscles  21. Visual deficits: discussed that these can improve as patient continues to heal from stroke. Continue visual scanning exercises.   22. Constipation: discussed high fiber foods    LOS: 9 days A FACE TO FACE EVALUATION WAS PERFORMED  Clint Bolder P Hamda Klutts 02/22/2023, 10:52 AM

## 2023-02-23 MED ORDER — CINACALCET HCL 30 MG PO TABS: 30.0000 mg | ORAL_TABLET | Freq: Every day | ORAL | 0 refills | Status: AC

## 2023-02-23 MED ORDER — CHOLESTYRAMINE 4 G PO PACK: 4.0000 g | PACK | Freq: Every day | ORAL | 0 refills | Status: DC

## 2023-02-23 MED ORDER — METOPROLOL TARTRATE 50 MG PO TABS: 75.0000 mg | ORAL_TABLET | Freq: Two times a day (BID) | ORAL | 0 refills | Status: DC

## 2023-02-23 MED ORDER — MAGNESIUM OXIDE -MG SUPPLEMENT 400 (240 MG) MG PO TABS: 400.0000 mg | ORAL_TABLET | Freq: Every day | ORAL | 0 refills | Status: DC

## 2023-02-23 MED ORDER — APIXABAN 5 MG PO TABS: 5.0000 mg | ORAL_TABLET | Freq: Two times a day (BID) | ORAL | Status: DC

## 2023-02-23 NOTE — Progress Notes (Signed)
Occupational Therapy Session Note  Patient Details  Name: JALYSE MEERSMAN MRN: [id number] Date of Birth: 05/21/1936  Today's Date: 02/23/2023 OT Individual Time: 1117-1200 OT Individual Time Calculation (min): 43 min    Short Term Goals: Week 2:  OT Short Term Goal 1 (Week 2): LTG =STG 2/2 ELOS  Skilled Therapeutic Interventions/Progress Updates: Patient received sitting up in w/c. Agreeable to OT treatment. Patient taken to therapy day room for NMRE working on FM coordination and functional reach of the RUE. Patient requiring max cues to look to right to observe therapist demo of FM thumb over finger and finger over thumb coordination task manipulating playing cards. Following initial difficulty motor planning patient was able to perform the motion with cues to maintain attention and trying the activity with the left hand.  Followed with functional reach scanning the table top for cones of specific colors and working on a reach with cylindrical grasp to stack the cones at midline. Worked on supination pronation rotating the cone to simulate pouring before placing it on the end. Repeated rotating end over end 5 times with moderate cues for completing a set with the same supination pronation movement. Patient with good participation throughout treatment. Continue with skilled OT POC to achieve LTG's for a safe discharge.     Therapy Documentation Precautions:  Precautions Precautions: Fall Precaution Comments: R hemi Restrictions Weight Bearing Restrictions: No General:   Vital Signs:   Pain: Pain Assessment Pain Scale: 0-10 Pain Score: 0-No pain     Therapy/Group: Individual Therapy  Warnell Forester 02/23/2023, 12:04 PM

## 2023-02-23 NOTE — Plan of Care (Signed)
  Problem: RH Swallowing Goal: LTG Patient will consume least restrictive diet using compensatory strategies with assistance (SLP) Description: LTG:  Patient will consume least restrictive diet using compensatory strategies with assistance (SLP) Outcome: Completed/Met Flowsheets (Taken 02/23/2023 1232) LTG: Pt Patient will consume least restrictive diet using compensatory strategies with assistance of (SLP): Modified Independent   Problem: RH Problem Solving Goal: LTG Patient will demonstrate problem solving for (SLP) Description: LTG:  Patient will demonstrate problem solving for basic/complex daily situations with cues  (SLP) Outcome: Not Met (add Reason) Flowsheets (Taken 02/23/2023 1232) LTG: Patient will demonstrate problem solving for (SLP): Basic daily situations LTG Patient will demonstrate problem solving for: Moderate Assistance - Patient 50 - 74% Note: Pt made slower than anticipated progress toward goal.   Problem: RH Memory Goal: LTG Patient will use memory compensatory aids to (SLP) Description: LTG:  Patient will use memory compensatory aids to recall biographical/new, daily complex information with cues (SLP) Outcome: Completed/Met Flowsheets (Taken 02/23/2023 1232) LTG: Patient will use memory compensatory aids to (SLP): Minimal Assistance - Patient > 75%   Problem: RH Attention Goal: LTG Patient will demonstrate this level of attention during functional activites (SLP) Description: LTG:  Patient will will demonstrate this level of attention during functional activites (SLP) Outcome: Completed/Met Flowsheets (Taken 02/23/2023 1232) Patient will demonstrate during cognitive/linguistic activities the attention type of: Sustained LTG: Patient will demonstrate this level of attention during cognitive/linguistic activities with assistance of (SLP): Minimal Assistance - Patient > 75%   Problem: RH Awareness Goal: LTG: Patient will demonstrate awareness during functional  activites type of (SLP) Description: LTG: Patient will demonstrate awareness during functional activites type of (SLP) Outcome: Completed/Met Flowsheets (Taken 02/23/2023 1232) Patient will demonstrate during cognitive/linguistic activities awareness type of: Intellectual LTG: Patient will demonstrate awareness during cognitive/linguistic activities with assistance of (SLP): Minimal Assistance - Patient > 75%

## 2023-02-23 NOTE — Progress Notes (Signed)
PROGRESS NOTE   Subjective/Complaints: No new complaints this morning Feels ready for d/c on Sunday Discussed that we will go over her medications and follow-ups today  ROS: Patient denies fever, rash, sore throat, blurred vision, dizziness, nausea, vomiting, diarrhea, cough, shortness of breath or chest pain, joint or back/neck pain, headache, or mood change. +visual deficits, +visual scanning impairment   Objective:   No results found. No results for input(s): "WBC", "HGB", "HCT", "PLT" in the last 72 hours.  No results for input(s): "NA", "K", "CL", "CO2", "GLUCOSE", "BUN", "CREATININE", "CALCIUM" in the last 72 hours.    Intake/Output Summary (Last 24 hours) at 02/23/2023 1100 Last data filed at 02/22/2023 1854 Gross per 24 hour  Intake 480 ml  Output --  Net 480 ml        Physical Exam: Vital Signs Blood pressure 125/71, pulse 88, temperature 97.8 F (36.6 C), temperature source Oral, resp. rate 16, height 4\' 11"  (1.499 m), weight 82.6 kg, SpO2 96 %. Constitutional: No distress . Vital signs reviewed. BMI 36.78 Gen: no distress, normal appearing HEENT: oral mucosa pink and moist, NCAT Cardio: Reg rate Chest: normal effort, normal rate of breathing Abd: soft, non-distended Ext: no edema Psych: pleasant, normal affect  Skin: IV RUE. Bruising RLQ, some moistened sl macerated sking in inguinal and abdominal folds on right. Maybe early candida.  Musc: Full ROM, No pain with AROM or PROM in the neck, trunk, or extremities. Posture appropriate  Neuro:         Comments: LUE 5/5 in Biceps, triceps, WE, grip and FA RUE- biceps 3+/5; Triceps 3+/5; WE 3+/5; grip 3+/5; and FA 3+/5 LLE- 5/5 in HF, KE, KF, DF and PF RLE- HF 3+5; KE/KF 3+/5; DF 3-+5 and PF 3+/5     Mental Status: She is alert. Improved recall    Comments: Patient is alert.  Noted right-sided inattention impoving!.    Decreased sensation to light touch on  RUE/RLE L gaze preference  Expressive aphasia improving--language much more automatic and fluid Standing with supervision Ambulating with RW CG Veers to the left when ambulating    Assessment/Plan: 1. Functional deficits which require 3+ hours per day of interdisciplinary therapy in a comprehensive inpatient rehab setting. Physiatrist is providing close team supervision and 24 hour management of active medical problems listed below. Physiatrist and rehab team continue to assess barriers to discharge/monitor patient progress toward functional and medical goals  Care Tool:  Bathing              Bathing assist Assist Level: Moderate Assistance - Patient 50 - 74%     Upper Body Dressing/Undressing Upper body dressing        Upper body assist Assist Level: Moderate Assistance - Patient 50 - 74%    Lower Body Dressing/Undressing Lower body dressing            Lower body assist Assist for lower body dressing: Moderate Assistance - Patient 50 - 74%     Toileting Toileting    Toileting assist Assist for toileting: Moderate Assistance - Patient 50 - 74%     Transfers Chair/bed transfer  Transfers assist  Chair/bed transfer activity did not  occur: Safety/medical concerns  Chair/bed transfer assist level: Minimal Assistance - Patient > 75%     Locomotion Ambulation   Ambulation assist      Assist level: Contact Guard/Touching assist Assistive device: Walker-rolling Max distance: 142ft   Walk 10 feet activity   Assist     Assist level: Contact Guard/Touching assist Assistive device: Walker-rolling   Walk 50 feet activity   Assist Walk 50 feet with 2 turns activity did not occur: Safety/medical concerns  Assist level: Contact Guard/Touching assist Assistive device: Walker-rolling    Walk 150 feet activity   Assist Walk 150 feet activity did not occur: Safety/medical concerns         Walk 10 feet on uneven surface  activity   Assist  Walk 10 feet on uneven surfaces activity did not occur: Safety/medical concerns         Wheelchair     Assist Is the patient using a wheelchair?: No Type of Wheelchair: Manual    Wheelchair assist level: Dependent - Patient 0%      Wheelchair 50 feet with 2 turns activity    Assist        Assist Level: Dependent - Patient 0%   Wheelchair 150 feet activity     Assist      Assist Level: Dependent - Patient 0%   Blood pressure 125/71, pulse 88, temperature 97.8 F (36.6 C), temperature source Oral, resp. rate 16, height 4\' 11"  (1.499 m), weight 82.6 kg, SpO2 96 %.    Medical Problem List and Plan: 1. 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             -patient may  shower             -ELOS/Goals: 12 days min A to supervision  Grounds pass ordered  Homocysteine reviewed and is 11.7   Continue CIR therapies including PT, OT, and SLP.   Commended on tremendous improvements!  Discussed that she may d/c Sunday morning when ready, does not need to wait for physician to see her since she has been medically stable 2.  Impaired mobility: continue SCDs             -antiplatelet therapy: Resuming eliquis along with Brilinta 45 mg twice daily.  Pt to follow up with neurology as outpt for further guidance with regimen , discussed with daughter.  3. Pain: N/a 4. Mood/Behavior/Sleep: Provide emotional support             -antipsychotic agents: Seroquel 25 mg nightly as needed 5. Neuropsych/cognition: This patient is? capable of making decisions on her own behalf. 6. Skin/Wound Care: Routine skin checks  -pt receiving barrier cream to inguinal folds  5/13-will add nystatin powder to inguinal and abdominal folds bid 7. Fluids/Electrolytes/Nutrition: Routine in and outs with follow-up chemistries 8.  Atrial fibrillation with history of failed cardioversion.  Continue Lopressor 75 mg  twice daily.  Eliquis may be resumed after SAH/ICH resolved 7 to 10 days, -neuro has reviewed repeat CT angio Head  -Head CT demonstrates SAH resolved and IPH stable and evolving.  -Neuro recs resuming  Eliquis with Brillinta until outpt follow up  9.  GERD.  Continue Protonix  10.  Diastolic congestive heart failure.  Monitor for any signs of fluid overload- Monitor daily weights.   11.  History of left breast cancer with chemotherapy and mastectomy.  Follow-up outpatient  12.  Morbid  obesity.  BMI 37.16-->35.18-->36.78  Provided dietary education  13.  Chronic anemia. Hgb reviewed and has normalized  14. Prediabetes: d/ced CBGs and insulin and discussed that insulin is not recommended for her current CBG levels, provided dietary education  Discussed that increased fiber intake can improve blood sugar control  15. Suboptimal magnesium: ordered 1 gram IV on 5/8. IV discontinued afterward since no more IV medications warranted. Discussed that we would not need repeat labs this admission  16. Expressive aphasia: commended on incredible improvements already! Continue SLP  17. Low protein levels: edited diet order to request that more protein be give with meals  18. Bleeding from Lovenox injection site: lovenox stopped. Eliquis resumed. Resolved  19. Impaired cognition: continue SLP, discussed impaired clock drawing test  20. Right truncal lean: discussed that right sided truncal muscles can also be weakened by stroke, encouraged continued sitting in chair rather than bed to strengthen truncal muscles  21. Visual deficits: discussed that these can improve as patient continues to heal from stroke. Continue visual scanning exercises.   22. Constipation: discussed high fiber foods   >50 minutes spent in discussion of her tremendous improvement in inpatient rehab, discussed that we will go over her medications and follow-up appointments with her today  LOS: 10 days A FACE TO FACE  EVALUATION WAS PERFORMED  Loretta Gates P Yvaine Jankowiak 02/23/2023, 11:00 AM

## 2023-02-23 NOTE — Progress Notes (Signed)
Physical Therapy Session Note  Patient Details  Name: Loretta Gates MRN: [id number] Date of Birth: 12-20-1935  Today's Date: 02/23/2023 PT Individual Time: 4098-1191 + 4782-9562 PT Individual Time Calculation (min): 72 min  + 27 min  Short Term Goals: Week 1:  PT Short Term Goal 1 (Week 1): Pt will complete bed mobility with minA PT Short Term Goal 1 - Progress (Week 1): Met PT Short Term Goal 2 (Week 1): Pt will complete bed<>chair transfers with minA and LRAD PT Short Term Goal 2 - Progress (Week 1): Met PT Short Term Goal 3 (Week 1): Pt will ambulate 48ft with minA and LRAD PT Short Term Goal 3 - Progress (Week 1): Met PT Short Term Goal 4 (Week 1): Pt will initiate stair training PT Short Term Goal 4 - Progress (Week 1): Met PT Short Term Goal 5 (Week 1): STG = LTG Week 2:  PT Short Term Goal 1 (Week 2): STG = LTG  Skilled Therapeutic Interventions/Progress Updates:       1st session: Pt sitting EOB to start with her daughter at bedside. Pt denies pain but reports not feeling her best today - didn't eat her breakfast. Reported feeling noxious earlier. Reports her nurse is aware.  Ambulated from her room to main rehab gym, ~12ft, with supervision and RW - w/c follow for safety in the event of fatigue. Continues to require assist for RW management to facilitate R turns. Slow, cautious gait, with cues needed for keeping body within walker frame.  In // bars, worked on [employer] for standing balance - standing on compliant surfaces (blue foam wedge) without UE support with supervision and intermittent CGA. Feet apart, feet together, feet apart eyes closed, feet together eyes closed. Perturbations with eyes open. Tandem walking forward/backward on foam balance beam with BUE support on // bars. Side stepping up and over 6" hurdle with RLE only (tried with 2.5# ankle weight but too heavy) - pt struggling with this task due to R inattention, sequencing, and visual deficits. Seated rest breaks  as needed.   Ambulated back to her room, ~165ft, with CGA and RW, while having 2.5# ankle weight attached to RLE to facilitate strengthening and awareness. Pt at times has insufficient step length on R, due to fatigue and weakness. Continues to drift  to her L while ambulating needs intermittent assist for correcting steering for RW management. Cues also provided for keeping body within walker frame as she ambulates for safety. Ended session seated in chair with daughter and MD present - all needs met.    2nd session: Pt sitting next to husband and agreeable to therapy session - no reports of pain. Sit<>stand to RW with supervision and patient ambulates with CGA to supervision with RW from her room to day room rehab gym, ~116ft. Continues to require assistance for RW management while turning to the R and cues needed to keep body within walker frame. Assisted onto Nustep and completed x7 minutes at L4 resistance using BUE/BLE with max cues for attending to RUE as she will loose grip and is unaware when this happens. Completed >200 steps within the 7 minutes. Ambulated back to her room with similar cues and assist as above. Ended session in chair next to her husband. All needs met.       Therapy Documentation Precautions:  Precautions Precautions: Fall Precaution Comments: R hemi Restrictions Weight Bearing Restrictions: No General:     Therapy/Group: Individual Therapy  Marquel Pottenger P Dorice Stiggers  PT, DPT, CSRS  02/23/2023, 7:41 AM

## 2023-02-23 NOTE — Plan of Care (Signed)
  Problem: Education: Goal: Understanding of CV disease, CV risk reduction, and recovery process will improve Outcome: Progressing Goal: Individualized Educational Video(s) Outcome: Progressing   Problem: Activity: Goal: Ability to return to baseline activity level will improve Outcome: Progressing   Problem: Cardiovascular: Goal: Ability to achieve and maintain adequate cardiovascular perfusion will improve Outcome: Progressing Goal: Vascular access site(s) Level 0-1 will be maintained Outcome: Progressing   Problem: Health Behavior/Discharge Planning: Goal: Ability to safely manage health-related needs after discharge will improve Outcome: Progressing   Problem: Education: Goal: Knowledge of disease or condition will improve Outcome: Progressing Goal: Knowledge of secondary prevention will improve (MUST DOCUMENT ALL) Outcome: Progressing Goal: Knowledge of patient specific risk factors will improve [name] N/A or DELETE if not current risk factor) Outcome: Progressing   Problem: Ischemic Stroke/TIA Tissue Perfusion: Goal: Complications of ischemic stroke/TIA will be minimized Outcome: Progressing   Problem: Coping: Goal: Will verbalize positive feelings about self Outcome: Progressing Goal: Will identify appropriate support needs Outcome: Progressing   Problem: Health Behavior/Discharge Planning: Goal: Ability to manage health-related needs will improve Outcome: Progressing Goal: Goals will be collaboratively established with patient/family Outcome: Progressing   Problem: Self-Care: Goal: Ability to participate in self-care as condition permits will improve Outcome: Progressing Goal: Verbalization of feelings and concerns over difficulty with self-care will improve Outcome: Progressing Goal: Ability to communicate needs accurately will improve Outcome: Progressing   Problem: Nutrition: Goal: Risk of aspiration will decrease Outcome: Progressing Goal: Dietary  intake will improve Outcome: Progressing

## 2023-02-23 NOTE — Progress Notes (Signed)
I called patient's retail pharmacy and confirmed which medications had been filled by hospitalists as well as any new medications and refills. AVS printed and discussed in detail with patient and her daughter, [name]. AVS left in room with patient's 3-ring binder.

## 2023-02-23 NOTE — Progress Notes (Signed)
Patient ID: Loretta Gates, female   DOB: May 01, 1936, 87 y.o.   MRN: [id number]  Met with daughter to see preference regarding home health agencies, she and mom has chosen [facility]. Have reached out to the liaison-[place] to sent her the order and make aware of preference. Have been told they can start within 24 hours. Pt and daughter pleased with Mom's progress and feels prepared for discharge Sunday. Pt's walker from home will work for her and the 3 in 1 has been delivered to the room yesterday. Daughter to order tub bench on own.

## 2023-02-23 NOTE — Progress Notes (Signed)
Inpatient Rehabilitation Care Coordinator Discharge Note DC SUNDAY 5/19  Patient Details  Name: Loretta Gates MRN: [id number] Date of Birth: July 17, 1936   Discharge location: HOME WITH HUSBAND AND DAUGHTER WHO CAN PROVIDE 24/7 CARE  Length of Stay: 12 DAYS  Discharge activity level: CGA LEVEL  Home/community participation: ACTIVE  Patient response QI:ONGEXB Literacy - How often do you need to have someone help you when you read instructions, pamphlets, or other written material from your doctor or pharmacy?: Never  Patient response MW:UXLKGM Isolation - How often do you feel lonely or isolated from those around you?: Never  Services provided included: MD, RD, PT, OT, SLP, RN, CM, TR, Pharmacy, Neuropsych, SW  Financial Services:  Field seismologist Utilized: Physiological scientist MEDICARE  Choices offered to/list presented to: PT, HUSBAND AND DAUGHTER  Follow-up services arranged:  Home Health, DME, Patient/Family has no preference for HH/DME agencies Home Health Agency: [facility]  PT  OT  SP    DME : [facility]-3 IN 1 HAS ROLLING WALKER AND WILL GET TUB BENCH ON OWN    Patient response to transportation need: Is the patient able to respond to transportation needs?: Yes In the past 12 months, has lack of transportation kept you from medical appointments or from getting medications?: No In the past 12 months, has lack of transportation kept you from meetings, work, or from getting things needed for daily living?: No   Patient/Family verbalized understanding of follow-up arrangements:  Yes  Individual responsible for coordination of the follow-up plan: Robb Matar [phone] AND [name]-DAUGHTER [phone]  Confirmed correct DME delivered: Lucy Chris 02/23/2023    Comments (or additional information):HUSBAND AND DAUGHTER HERE HERE DAILY AND DAUGHTER STAYED THE NIGHT WITH PT. HAVE BEEN EDUCATED ON PT'S CARE AND ALL FEEL COMFORTABLE WITH  DISCHARGE HOME.  Summary of Stay    Date/Time Discharge Planning CSW  02/21/23 [phone] Home with husband and daughter who are here daily and very involved. Aware will need 24/7 care at DC. Pt on neuro-psych list to be seen RGD  02/14/23 0833 New evaluation home with husband and daughter who works but may take time off will need to confirm and await evaualtions RGD       Lamondre Wesche, Lemar Livings

## 2023-02-23 NOTE — Progress Notes (Signed)
Speech Language Pathology Discharge Summary  Patient Details  Name: Loretta Gates MRN: [id number] Date of Birth: May 28, 1936  Date of Discharge from SLP service:Feb 23, 2023  Today's Date: 02/23/2023 SLP Individual Time: 4098-1191 SLP Individual Time Calculation (min): 45 min   Skilled Therapeutic Interventions: Pt was seen in am to address cognitive re- training. Pt was alert and seated upright in WC. She was transitioned to ST office for continuation of session. Skilled treatment focused on attention and problem solving goals. SLP facilitated session by initially providing min A for attention to R side in order to read therapy schedule with cues able to be faded to sup A. She was further challenged in structured task to address sustained attention while also addressing R inattention. Pt warranting sup A to attend to R side and sustained attention to task for ~8 minutes. In additional minutes of session, SLP engaged pt in functional prescription management task. Pt identified familiar prescriptions from current meds list and recalled how she took them at home. She answered medication management problem solving task with min A. In missed opportunities, SLP provided correct response and rationale. Direct handoff to OT at end of session.    Patient has met 4 of 5 long term goals.  Patient to discharge at [facility] level.  Reasons goals not met: Slower than anticipated progress toward problem solving goals.   Clinical Impression/Discharge Summary: Patient has made excellent gains and has met 4 of 5 LTGS's this admission due to improved cognition and swallowing. Pt is currently an overall min A for cognitive tasks and mod I for utilization of swallowing compensatory strategies. Pt continued to present with decreased probblem solving, safety awareness and impulsivity warranting min to mod A cues for safety. Pt/ family education complete and pt will discharge home with 24 hour superviison from family.  Pt would benefit from hme health f/u SLP services to maximize cognition in order to maximize her functional indepenendce and reduce caregiver burden.  Care Partner:  Caregiver Able to Provide Assistance: Yes  Type of Caregiver Assistance: Cognitive;Physical  Recommendation:  Home Health SLP;24 hour supervision/assistance  Rationale for SLP Follow Up: Maximize cognitive function and independence   Equipment:     Reasons for discharge: Discharged from hospital;Treatment goals met   Patient/Family Agrees with Progress Made and Goals Achieved: Yes    Renaee Munda 02/23/2023, 12:41 PM

## 2023-02-24 NOTE — Progress Notes (Signed)
Occupational Therapy Session Note  Patient Details  Name: Loretta Gates MRN: [id number] Date of Birth: [birth year]  Today's Date: 02/24/2023 OT Individual Time: 0912-1000 OT Individual Time Calculation (min): 48 min    Short Term Goals: Week 1:  OT Short Term Goal 1 (Week 1): Pt will complete toilet transfer at ambulation level with min assist. OT Short Term Goal 1 - Progress (Week 1): Met OT Short Term Goal 2 (Week 1): Pt will complete donning/doffing pants and brief/underwear with min assist and mod cues to attend to right side of body. OT Short Term Goal 2 - Progress (Week 1): Met OT Short Term Goal 3 (Week 1): Pt will donn/doff shirt with min assist and min cues to attend to right side of body. OT Short Term Goal 3 - Progress (Week 1): Met OT Short Term Goal 4 (Week 1): Pt will bathe UB/LB using shower bench with min assist and min cues. OT Short Term Goal 4 - Progress (Week 1): Met  Skilled Therapeutic Interventions/Progress Updates:    Patient was seated at w/c LOF at the time of arrival.  The pt was in agreement with completing UB exercises and NMR to improve  functional outcome in relation to  the RUE. The pt was able to come from sit to stand in front of the mirror to observe her anatomical position during task performance.  The pt tolerated manual manipulation of the scapular and surrounding structures to improve communication for functional gains.  A bright modified sock was placed on the pt's RUE to address the right neglect and to improve active engagement of the extremity.  The pt was able to complete towel exercises 2 sets of 10 for shld flexion, horizontal abduction, and shld rotation with attention on maintaining symmetry of the L and R side. , The pt was able to demonstrate > consistency with the bright yellow modified sock in place.  The pt was also instructed to hold the towel with attention to grasp as well.  The pt required 2 rest breaks with each exercise and was  instructed to incorporate relaxation breathing for > compliance.  The pt was instructed to thread her fingers and demonstrate AROM in various planes 1 set of 10 with rest breaks as needed, the pt required 3 rest breaks. The pt went on to remove beads from the theraputty to improve her finger strength.  At the end of the treatment session the pt remained at w/c LOF  with her call light and bedside table within reach.  All additional needs were addressed prior to exiting the room.   Therapy Documentation Precautions:  Precautions Precautions: Fall Precaution Comments: R hemi; R inattention; R visual deficits Restrictions Weight Bearing Restrictions: No  Therapy/Group: Individual Therapy  Lavona Mound 02/24/2023, 3:45 PM

## 2023-02-24 NOTE — Plan of Care (Signed)

## 2023-02-24 NOTE — Progress Notes (Signed)
Inpatient Rehabilitation Discharge Medication Review by a Pharmacist   A complete drug regimen review was completed for this patient to identify any potential clinically significant medication issues.   High Risk Drug Classes Is patient taking? Indication by Medication  Antipsychotic No   Anticoagulant Yes Apixaban - AF  Antibiotic No    Opioid No    Antiplatelet Yes ticagrelor- cva ppx  Hypoglycemics/insulin Yes Jardiance- T2DM/HF  Vasoactive Medication Yes Lopressor- HTN  Chemotherapy No    Other Yes Sensipar- primary hyperparathyroidism s/p right inferior parathyoidectomy (partial removal) 2006 Questran- HLD Ditropan- urinary incontinence Protonix- GERD Vitamin D - supplementation Magnesium- supplementation        Type of Medication Issue Identified Description of Issue Recommendation(s)  Drug Interaction(s) (clinically significant)        Duplicate Therapy        Allergy        No Medication Administration End Date        Incorrect Dose        Additional Drug Therapy Needed        Significant med changes from prior encounter (inform family/care partners about these prior to discharge). Prolia discontinued     Other   PTA meds:  Lasix Avapro       Not been restarted, will follow up about  restart outpatient      Clinically significant medication issues were identified that warrant physician communication and completion of prescribed/recommended actions by midnight of the next day:  No   Pharmacist Comments: I reached out about adding avapro back to her med list for chronic heart failure and to continue the medication since it has not been started in rehab and provider would like to hold at this time given blood pressure controlled and to follow up about restarting.  Time spent performing this drug regimen review (minutes):  30  Thanks,  Arabella Merles, PharmD. [facility] Acute Care PGY-1 02/24/2023 12:08 PM

## 2023-02-24 NOTE — Progress Notes (Signed)
Physical Therapy Discharge Summary  Patient Details  Name: CELICA PURINTON MRN: [id number] Date of Birth: 1936-08-13  Date of Discharge from PT service:Feb 24, 2023  Patient has met 9 of 9 long term goals due to improved activity tolerance, improved balance, improved postural control, increased strength, ability to compensate for deficits, functional use of  right upper extremity and right lower extremity, improved attention, and improved awareness.  Patient to discharge at an ambulatory level Supervision.   Patient's care partner is independent to provide the necessary physical and cognitive assistance at discharge.  Reasons goals not met: n/a  Recommendation:  Patient will benefit from ongoing skilled PT services in home health setting to continue to advance safe functional mobility, address ongoing impairments in R sided weakness, visual deficits, caregiver training, home safety, dynamic standing balance, gait training, and minimize fall risk.  Equipment: No equipment provided - pt owns RW  Reasons for discharge: treatment goals met and discharge from hospital  Patient/family agrees with progress made and goals achieved: Yes  PT Discharge Precautions/Restrictions Precautions Precautions: Fall Precaution Comments: R hemi; R inattention; R visual deficits Restrictions Weight Bearing Restrictions: No Pain Interference Pain Interference Pain Effect on Sleep: 0. Does not apply - I have not had any pain or hurting in the past 5 days Pain Interference with Therapy Activities: 0. Does not apply - I have not received rehabilitationtherapy in the past 5 days Pain Interference with Day-to-Day Activities: 1. Rarely or not at all Vision/Perception  Vision - History Ability to See in Adequate Light: 1 Impaired Vision - Assessment Eye Alignment: Within Functional Limits Ocular Range of Motion: Restricted on the right Alignment/Gaze Preference: Gaze left;Head turned Tracking/Visual  Pursuits: Decreased smoothness of horizontal tracking;Decreased smoothness of vertical tracking;Requires cues, head turns, or add eye shifts to track;Unable to hold eye position out of midline Saccades: Impaired - to be further tested in functional context Convergence: Impaired - to be further tested in functional context Perception Perception: Impaired Inattention/Neglect: Does not attend to right visual field;Does not attend to right side of body Praxis Praxis: Impaired Praxis Impairment Details: Initiation  Cognition Overall Cognitive Status: Impaired/Different from baseline Arousal/Alertness: Awake/alert Orientation Level: Oriented to person;Oriented to situation Attention: Focused;Selective;Alternating Focused Attention: Appears intact Sustained Attention: Impaired Sustained Attention Impairment: Verbal basic;Functional basic Selective Attention: Impaired Selective Attention Impairment: Verbal basic;Functional basic Alternating Attention: Impaired Alternating Attention Impairment: Verbal basic;Functional basic Memory: Impaired Memory Impairment: Decreased recall of new information;Retrieval deficit;Decreased short term memory Decreased Short Term Memory: Verbal basic;Functional basic Awareness: Impaired Awareness Impairment: Intellectual impairment Problem Solving: Impaired Problem Solving Impairment: Verbal basic;Functional basic Executive Function: Reasoning;Self Monitoring;Self Correcting;Sequencing;Decision Making;Initiating Reasoning: Impaired Reasoning Impairment: Verbal basic;Functional basic Sequencing: Impaired Sequencing Impairment: Verbal basic;Functional basic Decision Making: Impaired Decision Making Impairment: Verbal basic;Functional basic Initiating: Impaired Initiating Impairment: Verbal basic;Functional basic Self Monitoring: Impaired Self Monitoring Impairment: Verbal basic;Functional basic Self Correcting: Impaired Self Correcting Impairment: Verbal  basic;Functional basic Safety/Judgment: Impaired Comments: decreased insight and awareness Sensation Sensation Light Touch: Impaired Detail Peripheral sensation comments: Able to discern light touch but diminished compared to her L side Light Touch Impaired Details: Impaired RUE;Impaired RLE Hot/Cold: Appears Intact Proprioception: Impaired Detail Proprioception Impaired Details: Impaired RUE;Impaired RLE Stereognosis: Not tested;Impaired by gross assessment Coordination Gross Motor Movements are Fluid and Coordinated: No Fine Motor Movements are Fluid and Coordinated: No Coordination and Movement Description: R hemi, R inattention Motor  Motor Motor: Hemiplegia;Abnormal postural alignment and control  Mobility Bed Mobility Supine to Sit: Supervision/Verbal cueing Sit to Supine:  Supervision/Verbal cueing Transfers Transfers: Sit to Stand;Stand to Sit;Stand Pivot Transfers Sit to Stand: Independent with assistive device Stand to Sit: Independent with assistive device Stand Pivot Transfers: Supervision/Verbal cueing Stand Pivot Transfer Details: Verbal cues for gait pattern;Verbal cues for safe use of DME/AE;Verbal cues for precautions/safety Transfer (Assistive device): Rolling walker Locomotion  Gait Ambulation: Yes Gait Assistance: Supervision/Verbal cueing Gait Distance (Feet): 165 Feet Assistive device: Rolling walker Gait Assistance Details: Verbal cues for safe use of DME/AE;Verbal cues for gait pattern;Verbal cues for precautions/safety Gait Gait: Yes Gait Pattern: Impaired Gait Pattern: Step-through pattern;Decreased step length - right;Decreased stance time - right;Decreased hip/knee flexion - right;Decreased dorsiflexion - right;Decreased weight shift to right;Trunk flexed;Poor foot clearance - right;Lateral trunk lean to right Gait velocity: 0.21 m/s Stairs / Additional Locomotion Stairs: Yes Stairs Assistance: Contact Guard/Touching assist Stair Management  Technique: Two rails;Step to pattern;Forwards Number of Stairs: 12 Height of Stairs: 6 Pick up small object from the floor assist level: Minimal Assistance - Patient > 75% Wheelchair Mobility Wheelchair Mobility: No  Trunk/Postural Assessment  Cervical Assessment Cervical Assessment: Exceptions to [facility] (forward head; L rotation) Thoracic Assessment Thoracic Assessment: Exceptions to [facility] (thoracic; rounded shoulders) Lumbar Assessment Lumbar Assessment: Exceptions to [facility] (posteriorly tilted) Postural Control Postural Control: Deficits on evaluation Trunk Control: posterior and right lateral lean Righting Reactions: delayed and insufficient  Balance Balance Balance Assessed: Yes Standardized Balance Assessment Standardized Balance Assessment: Timed Up and Go Test Berg Balance Test Sit to Stand: Able to stand  independently using hands Standing Unsupported: Able to stand 2 minutes with supervision Sitting with Back Unsupported but Feet Supported on Floor or Stool: Able to sit safely and securely 2 minutes Stand to Sit: Controls descent by using hands Transfers: Able to transfer with verbal cueing and /or supervision Standing Unsupported with Eyes Closed: Able to stand 10 seconds with supervision Standing Ubsupported with Feet Together: Needs help to attain position and unable to hold for 15 seconds From Standing, Reach Forward with Outstretched Arm: Reaches forward but needs supervision From Standing Position, Pick up Object from Floor: Unable to pick up and needs supervision From Standing Position, Turn to Look Behind Over each Shoulder: Needs assist to keep from losing balance and falling Turn 360 Degrees: Needs assistance while turning Standing Unsupported, Alternately Place Feet on Step/Stool: Needs assistance to keep from falling or unable to try Standing Unsupported, One Foot in Front: Loses balance while stepping or standing Standing on One Leg: Unable to try or needs assist to  prevent fall Total Score: 20 Timed Up and Go Test TUG: Normal TUG Normal TUG (seconds): 85 Extremity Assessment      RLE Assessment RLE Assessment: Exceptions to [facility] RLE Strength RLE Overall Strength: Deficits Right Hip Flexion: 2+/5 Right Hip Extension: 2+/5 Right Hip ABduction: 3-/5 Right Hip ADduction: 3/5 Right Knee Flexion: 3-/5 Right Knee Extension: 4-/5 Right Ankle Dorsiflexion: 4-/5 LLE Assessment LLE Assessment: Within Functional Limits   Chirstina Haan P Pastor Sgro  PT, DPT, CSRS  02/24/2023, 7:47 AM

## 2023-02-24 NOTE — Progress Notes (Addendum)
PROGRESS NOTE   Subjective/Complaints: Daughter asking about gait belt- if it's theirs or not.   Checked with nursing,  Doing well- LBM yesterday- small- no constipation.  Ate 2 bowls of cream of wheat. No sausage.     ROS:    Pt denies SOB, abd pain, CP, N/V/C/D, and vision changes- stable  Objective:   No results found. No results for input(s): "WBC", "HGB", "HCT", "PLT" in the last 72 hours.  No results for input(s): "NA", "K", "CL", "CO2", "GLUCOSE", "BUN", "CREATININE", "CALCIUM" in the last 72 hours.    Intake/Output Summary (Last 24 hours) at 02/24/2023 0943 Last data filed at 02/23/2023 1344 Gross per 24 hour  Intake 480 ml  Output --  Net 480 ml        Physical Exam: Vital Signs Blood pressure 133/67, pulse 81, temperature 97.8 F (36.6 C), temperature source Oral, resp. rate 18, height 4\' 11"  (1.499 m), weight 82.6 kg, SpO2 96 %.    General: awake, alert, appropriate, daughter at bedside; NAD HENT: conjugate gaze; oropharynx moist CV: regular rate; no JVD Pulmonary: CTA B/L; no W/R/R- good air movement GI: soft, NT, ND, (+)BS Psychiatric: appropriate- bright affect Neurological: Ox3 Some word finding issues, but much better than admission Skin: IV RUE. Bruising RLQ, some moistened sl macerated sking in inguinal and abdominal folds on right. Maybe early candida.  Musc: Full ROM, No pain with AROM or PROM in the neck, trunk, or extremities. Posture appropriate  Neuro:         Comments: LUE 5/5 in Biceps, triceps, WE, grip and FA RUE- biceps 3+/5; Triceps 3+/5; WE 3+/5; grip 3+/5; and FA 3+/5 LLE- 5/5 in HF, KE, KF, DF and PF RLE- HF 3+5; KE/KF 3+/5; DF 3-+5 and PF 3+/5     Mental Status: She is alert. Improved recall    Comments: Patient is alert.  Noted right-sided inattention impoving!.    Decreased sensation to light touch on RUE/RLE L gaze preference  Expressive aphasia  improving--language much more automatic and fluid Standing with supervision Ambulating with RW CG Veers to the left when ambulating    Assessment/Plan: 1. Functional deficits which require 3+ hours per day of interdisciplinary therapy in a comprehensive inpatient rehab setting. Physiatrist is providing close team supervision and 24 hour management of active medical problems listed below. Physiatrist and rehab team continue to assess barriers to discharge/monitor patient progress toward functional and medical goals  Care Tool:  Bathing              Bathing assist Assist Level: Moderate Assistance - Patient 50 - 74%     Upper Body Dressing/Undressing Upper body dressing        Upper body assist Assist Level: Moderate Assistance - Patient 50 - 74%    Lower Body Dressing/Undressing Lower body dressing            Lower body assist Assist for lower body dressing: Moderate Assistance - Patient 50 - 74%     Toileting Toileting    Toileting assist Assist for toileting: Moderate Assistance - Patient 50 - 74%     Transfers Chair/bed transfer  Transfers assist  Chair/bed transfer activity  did not occur: Safety/medical concerns  Chair/bed transfer assist level: Supervision/Verbal cueing     Locomotion Ambulation   Ambulation assist      Assist level: Supervision/Verbal cueing Assistive device: Walker-rolling Max distance: 163ft   Walk 10 feet activity   Assist     Assist level: Supervision/Verbal cueing Assistive device: Walker-rolling   Walk 50 feet activity   Assist Walk 50 feet with 2 turns activity did not occur: Safety/medical concerns  Assist level: Supervision/Verbal cueing Assistive device: Walker-rolling    Walk 150 feet activity   Assist Walk 150 feet activity did not occur: Safety/medical concerns  Assist level: Supervision/Verbal cueing Assistive device: Walker-rolling    Walk 10 feet on uneven surface  activity   Assist  Walk 10 feet on uneven surfaces activity did not occur: Safety/medical concerns   Assist level: Contact Guard/Touching assist Assistive device: Walker-rolling   Wheelchair     Assist Is the patient using a wheelchair?: No Type of Wheelchair: Manual    Wheelchair assist level: Dependent - Patient 0%      Wheelchair 50 feet with 2 turns activity    Assist        Assist Level: Dependent - Patient 0%   Wheelchair 150 feet activity     Assist      Assist Level: Dependent - Patient 0%   Blood pressure 133/67, pulse 81, temperature 97.8 F (36.6 C), temperature source Oral, resp. rate 18, height 4\' 11"  (1.499 m), weight 82.6 kg, SpO2 96 %.    Medical Problem List and Plan: 1. 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             -patient may  shower             -ELOS/Goals: 12 days min A to supervision  Grounds pass ordered  Homocysteine reviewed and is 11.7   Con't CIR PT, OT and SLP D/c Sunday-  2.  Impaired mobility: continue SCDs             -antiplatelet therapy: Resuming eliquis along with Brilinta 45 mg twice daily.  Pt to follow up with neurology as outpt for further guidance with regimen , discussed with daughter.  3. Pain: N/a 4. Mood/Behavior/Sleep: Provide emotional support             -antipsychotic agents: Seroquel 25 mg nightly as needed 5. Neuropsych/cognition: This patient is? capable of making decisions on her own behalf. 6. Skin/Wound Care: Routine skin checks  -pt receiving barrier cream to inguinal folds  5/13-will add nystatin powder to inguinal and abdominal folds bid 7. Fluids/Electrolytes/Nutrition: Routine in and outs with follow-up chemistries 8.  Atrial fibrillation with history of failed cardioversion.  Continue Lopressor 75 mg twice daily.  Eliquis may be resumed after SAH/ICH resolved 7 to 10 days, -neuro has reviewed repeat CT angio Head   -Head CT demonstrates SAH resolved and IPH stable and evolving.  -Neuro recs resuming  Eliquis with Brillinta until outpt follow up  9.  GERD.  Continue Protonix  10.  Diastolic congestive heart failure.  Monitor for any signs of fluid overload- Monitor daily weights.   5/18- didn't see daily weights- added.  11.  History of left breast cancer with chemotherapy and mastectomy.  Follow-up outpatient  12.  Morbid obesity.  BMI 37.16-->35.18-->36.78  Provided dietary education  13.  Chronic anemia. Hgb reviewed and has normalized  14. Prediabetes: d/ced  CBGs and insulin and discussed that insulin is not recommended for her current CBG levels, provided dietary education  Discussed that increased fiber intake can improve blood sugar control  15. Suboptimal magnesium: ordered 1 gram IV on 5/8. IV discontinued afterward since no more IV medications warranted. Discussed that we would not need repeat labs this admission  16. Expressive aphasia: commended on incredible improvements already! Continue SLP  17. Low protein levels: edited diet order to request that more protein be give with meals  18. Bleeding from Lovenox injection site: lovenox stopped. Eliquis resumed. Resolved  19. Impaired cognition: continue SLP, discussed impaired clock drawing test  20. Right truncal lean: discussed that right sided truncal muscles can also be weakened by stroke, encouraged continued sitting in chair rather than bed to strengthen truncal muscles  21. Visual deficits: discussed that these can improve as patient continues to heal from stroke. Continue visual scanning exercises.   22. Constipation: discussed high fiber foods   5/18- LBM yesterday- small- doesn't want intervention today  I spent a total of  52  minutes on total care today- >50% coordination of care- due to  D/w teama bout her d/c as well as d/w nursing about gait belt- it's hers to go home with.    Needs f/u with PCP about Avapro- BP too  well controlled to restart right now, but will need PCP to decide what to do after d/c.d/w pharmacist at length.    LOS: 11 days A FACE TO FACE EVALUATION WAS PERFORMED  Loretta Gates 02/24/2023, 9:43 AM

## 2023-02-24 NOTE — Progress Notes (Signed)
Physical Therapy Session Note  Patient Details  Name: Loretta Gates MRN: [id number] Date of Birth: July 28, 1936  Today's Date: 02/24/2023 PT Individual Time: 3086-5784 PT Individual Time Calculation (min): 60 min   Short Term Goals: Week 2:  PT Short Term Goal 1 (Week 2): STG = LTG  Skilled Therapeutic Interventions/Progress Updates:      Pt ambulating with daughter assisting out of the bathroom to the sink - handoff of care to PT and patient completed hand washing at the sink in standing - leaning on forearms while washing hands. Without seated rest break, she ambulated ~151ft with supervision and RW from her room to main rehab gym - cues for keeping body within walker frame and verbal cueing "sharp turn to the R" to facilitate 90deg turns to the R.   Worked on R inattention, R visual scanning, problem solving, and dual-tasking by using colored peg board with picture replication. Patient able to verbalize instructions and recall picture/colors. She used only her RUE to work on [facility], [facility], and dexterity. Patient needing assist for placing pegs into board but able to reach across midline and grab pegs out of bucket. Supervision for standing balance while doing task.   Stair training completed with 6inch steps and 2 hand rails - able to navigate up/down x12 steps total with CGA - forward facing with step-to pattern. No knee buckling observed when descending with stronger L leg.   Pt ambulated back to her room, ~245ft, with supervision and RW. Gait speed 0.21 m/s indicative of household ambulator and increased falls risk. Continued to work on visual scanning, increasing gait speed, and keeping body within walker frame. Patient ended session seated in w/c with husband at the bedside. All needs met. Family without questions regarding DC.    Therapy Documentation Precautions:  Precautions Precautions: Fall Precaution Comments: R hemi Restrictions Weight Bearing Restrictions: No General:       Therapy/Group: Individual Therapy  Loretta Gates Loretta Gates  PT, DPT, CSRS  02/24/2023, 7:30 AM

## 2023-02-26 ENCOUNTER — Encounter: Payer: Self-pay | Admitting: Family Medicine

## 2023-02-26 ENCOUNTER — Telehealth: Payer: Self-pay

## 2023-02-26 ENCOUNTER — Encounter: Payer: Medicare HMO | Attending: Physical Medicine and Rehabilitation | Admitting: Physical Medicine and Rehabilitation

## 2023-02-26 DIAGNOSIS — I6602 Occlusion and stenosis of left middle cerebral artery: Secondary | ICD-10-CM | POA: Diagnosis not present

## 2023-02-26 DIAGNOSIS — I63512 Cerebral infarction due to unspecified occlusion or stenosis of left middle cerebral artery: Secondary | ICD-10-CM | POA: Insufficient documentation

## 2023-02-26 DIAGNOSIS — I4891 Unspecified atrial fibrillation: Secondary | ICD-10-CM | POA: Insufficient documentation

## 2023-02-26 NOTE — Progress Notes (Signed)
   Subjective:    Patient ID: Loretta Gates, female    DOB: [birth year], 87 y.o.   MRN: [id number]  HPI An audio/video tele-health visit is felt to be the most appropriate encounter for this patient at this time. This is a follow up tele-visit via phone. The patient is at home. MD is at office. Prior to scheduling this appointment, our staff discussed the limitations of evaluation and management by telemedicine and the availability of in-person appointments. The patient expressed understanding and agreed to proceed.   Loretta Gates is an 87 year old woman who presents after CIR admission for left MCA CVA  1) Left MCA CVA -hopeful that things are improving at home.    Review of Systems     Objective:   Physical Exam Not performed       Assessment & Plan:  1) Left MCA CVA -discussed that neurology would like her to continue Eliqiuis and Brilinta  2) Osteoporosis: -discussed that she can restart the Prolia  3) Bladder pain post- procedure -discussed use of lidocaine jelly, calling primary  8 minutes spent in discussion of Eliquis and Brilinta, restarting Prolio, bladder pain post-procedure, recommended use of lidocaine jelly

## 2023-02-26 NOTE — Transitions of Care (Post Inpatient/ED Visit) (Signed)
02/26/2023  Name: Loretta Gates MRN: [id number] DOB: October 13, 1935  Today's TOC FU Call Status: Today's TOC FU Call Status:: Successful TOC FU Call Competed TOC FU Call Complete Date: 02/26/23  Transition Care Management Follow-up Telephone Call Date of Discharge: 02/25/23 Discharge Facility: Other ([facility]) Name of Other ([facility]) Discharge Facility: [facility] Type of Discharge: Inpatient Admission Primary Inpatient Discharge Diagnosis:: stroke How have you been since you were released from the hospital?: Better Any questions or concerns?: No  Items Reviewed: Did you receive and understand the discharge instructions provided?: Yes Medications obtained,verified, and reconciled?: Yes (Medications Reviewed) Any new allergies since your discharge?: No Dietary orders reviewed?: Yes Do you have support at home?: Yes People in Home: significant other  Medications Reviewed Today: Medications Reviewed Today     Reviewed by Karena Addison, LPN (Licensed Practical Nurse) on 02/26/23 at 1041  Med List Status: <None>   Medication Order Taking? Sig Documenting Provider Last Dose Status Informant  acetaminophen (TYLENOL) 325 MG tablet [id number] Yes Take 2 tablets (650 mg total) by mouth every 4 (four) hours as needed for mild pain (or temp > 37.5 C (99.5 F)). Angiulli, Mcarthur Rossetti, PA-C Taking Active   apixaban (ELIQUIS) 5 MG TABS tablet [id number] Yes Take 1 tablet (5 mg total) by mouth 2 (two) times daily. Setzer, Lynnell Jude, PA-C Taking Active   cholestyramine (QUESTRAN) 4 g packet [id number] Yes Take 1 packet (4 g total) by mouth daily at 6 PM. Valetta Fuller, Lynnell Jude, PA-C Taking Active   cinacalcet (SENSIPAR) 30 MG tablet [id number] Yes Take 1 tablet (30 mg total) by mouth daily with breakfast. Milinda Antis, PA-C Taking Active   empagliflozin (JARDIANCE) 10 MG TABS tablet [id number] Yes TAKE ONE TABLET BY MOUTH ONE TIME DAILY  Patient taking differently: Take 10 mg by mouth daily.    Copland, Gwenlyn Found, MD Taking Active Spouse/Significant Other, Child  ergocalciferol (VITAMIN D2) 1.25 MG (50000 UT) capsule [id number] Yes Take 50,000 Units by mouth every 14 (fourteen) days. Every other saturday [provider] Taking Active Spouse/Significant Other, Child  magnesium oxide (MAG-OX) 400 (240 Mg) MG tablet [id number] Yes Take 1 tablet (400 mg total) by mouth daily. Setzer, Lynnell Jude, PA-C Taking Active   Metoprolol Tartrate (LOPRESSOR) 50 MG tablet [id number] Yes Take 1.5 tablets (75 mg total) by mouth 2 (two) times daily. Setzer, Lynnell Jude, PA-C Taking Active   omeprazole (PRILOSEC) 40 MG capsule [id number] Yes Take 1 capsule (40 mg total) by mouth 2 (two) times daily before lunch and supper.  Patient taking differently: Take 40 mg by mouth in the morning and at bedtime.   Copland, Gwenlyn Found, MD Taking Active Spouse/Significant Other, Child  oxybutynin (DITROPAN XL) 15 MG 24 hr tablet [id number] Yes Take 1 tablet (15 mg total) by mouth at bedtime.  Patient taking differently: Take 15 mg by mouth at bedtime.   Copland, Gwenlyn Found, MD Taking Active Spouse/Significant Other, Child  ticagrelor (BRILINTA) 90 MG TABS tablet [id number] Yes Take 0.5 tablets (45 mg total) by mouth 2 (two) times daily. Kennieth Francois, PA Taking Active             Home Care and Equipment/Supplies: Were Home Health Services Ordered?: Yes Name of Home Health Agency:: unknown Has Agency set up a time to come to your home?: Yes First Home Health Visit Date: 02/26/23 Any new equipment or medical supplies ordered?: NA  Functional Questionnaire: Do you need assistance with bathing/showering or dressing?: Yes Do you  need assistance with meal preparation?: Yes Do you need assistance with eating?: No Do you have difficulty maintaining continence: No Do you need assistance with getting out of bed/getting out of a chair/moving?: Yes Do you have difficulty managing or taking your medications?: No  Follow  up appointments reviewed: PCP Follow-up appointment confirmed?: NA (declined) Specialist Hospital Follow-up appointment confirmed?: NA Do you need transportation to your follow-up appointment?: No Do you understand care options if your condition(s) worsen?: Yes-patient verbalized understanding    SIGNATURE Karena Addison, LPN [facility] Nurse Health Advisor Direct Dial [phone]

## 2023-02-26 NOTE — Telephone Encounter (Signed)
Transitional Care call--who you talked with    Are you/is patient experiencing any problems since coming home? Are there any questions regarding any aspect of care? NO Are there any questions regarding medications administration/dosing? Are meds being taken as prescribed? Patient should review meds with caller to confirm patient daughter wants to clarify if she should be taking both the brilinta as well as the eliquis, she also wants to know why lasix(furosemide) was stopped because she has been on it for years, the cholestyramine she was told to take in the morning but usually takes it at dinner time wants to make sure it is okay for her to take at dinner time, also she was taking prolia shots for her bone health and was told to stop taking that, but she has also been getting those shots for a while, she wants to know if she can continue getting those shots  Have there been any falls? NO Has Home Health been to the house and/or have they contacted you? If not, have you tried to contact them? Can we help you contact them? Yes have been contacted already  Are bowels and bladder emptying properly? Are there any unexpected incontinence issues? If applicable, is patient following bowel/bladder programs?  No Any fevers, problems with breathing, unexpected pain? Patient is having vaginal pain, not sure if it is due to the procedure she had. Are there any skin problems or new areas of breakdown? No Has the patient/family member arranged specialty MD follow up (ie cardiology/neurology/renal/surgical/etc)?  Can we help arrange? Patient is having vaginal pain, not sure if it is due to the procedure she had. Does the patient need any other services or support that we can help arrange? Yes Are caregivers following through as expected in assisting the patient?Yes Has the patient quit smoking, drinking alcohol, or using drugs as recommended?  Doesn't apply never smoked, drink, or used drugs   Appointment time  10:45 AM , arrive time 10:20 w/ Loretta Gates, Then Back to Dr Carlis Abbott  [address]

## 2023-02-27 NOTE — Plan of Care (Signed)
  Problem: RH Eating Goal: LTG Patient will perform eating w/assist, cues/equip (OT) Description: LTG: Patient will perform eating with assist, with/without cues using equipment (OT) Outcome: Completed/Met   Problem: RH Grooming Goal: LTG Patient will perform grooming w/assist,cues/equip (OT) Description: LTG: Patient will perform grooming with assist, with/without cues using equipment (OT) Outcome: Completed/Met   Problem: RH Bathing Goal: LTG Patient will bathe all body parts with assist levels (OT) Description: LTG: Patient will bathe all body parts with assist levels (OT) Outcome: Completed/Met   Problem: RH Dressing Goal: LTG Patient will perform upper body dressing (OT) Description: LTG Patient will perform upper body dressing with assist, with/without cues (OT). Outcome: Completed/Met   Problem: RH Dressing Goal: LTG Patient will perform lower body dressing w/assist (OT) Description: LTG: Patient will perform lower body dressing with assist, with/without cues in positioning using equipment (OT) Outcome: Completed/Met   Problem: RH Toileting Goal: LTG Patient will perform toileting task (3/3 steps) with assistance level (OT) Description: LTG: Patient will perform toileting task (3/3 steps) with assistance level (OT)  Outcome: Completed/Met   Problem: RH Functional Use of Upper Extremity Goal: LTG Patient will use RT/LT upper extremity as a (OT) Description: LTG: Patient will use right/left upper extremity as a stabilizer/gross assist/diminished/nondominant/dominant level with assist, with/without cues during functional activity (OT) Outcome: Completed/Met   Problem: RH Toilet Transfers Goal: LTG Patient will perform toilet transfers w/assist (OT) Description: LTG: Patient will perform toilet transfers with assist, with/without cues using equipment (OT) Outcome: Completed/Met   Problem: RH Tub/Shower Transfers Goal: LTG Patient will perform tub/shower transfers w/assist  (OT) Description: LTG: Patient will perform tub/shower transfers with assist, with/without cues using equipment (OT) Outcome: Completed/Met

## 2023-02-27 NOTE — Progress Notes (Signed)
Occupational Therapy Discharge Summary  Patient Details  Name: Loretta Gates MRN: [id number] Date of Birth: [birth year]  Date of Discharge from OT service:Feb 24, 2022   Patient has met 9 of 9 long term goals due to improved activity tolerance, improved balance, postural control, ability to compensate for deficits, functional use of  LEFT upper and LEFT lower extremity, improved attention, improved awareness, and improved coordination.  Patient to discharge at [facility] Assist level.  Patient's care partner is independent to provide the necessary physical assistance at discharge.    Reasons goals not met: n/a  Recommendation:  Patient will benefit from ongoing skilled OT services in home health setting to continue to advance functional skills in the area of BADL and Reduce care partner burden.  Equipment: No equipment provided  Reasons for discharge: treatment goals met and discharge from hospital  Patient/family agrees with progress made and goals achieved: Yes  OT Discharge Precautions/Restrictions  Precautions Precautions: Fall Precaution Comments: R hemi; R inattention; R visual deficits Restrictions Weight Bearing Restrictions: No ADL ADL Eating: Supervision/safety Grooming: Supervision/safety Where Assessed-Grooming: Standing at sink Upper Body Bathing: Supervision/safety Where Assessed-Upper Body Bathing: Sitting at sink Lower Body Bathing: Minimal assistance Where Assessed-Lower Body Bathing: Sitting at sink, Standing at sink Upper Body Dressing: Supervision/safety Where Assessed-Upper Body Dressing: Sitting at sink Lower Body Dressing: Minimal assistance Where Assessed-Lower Body Dressing: Sitting at sink, Standing at sink Toileting: Minimal assistance Where Assessed-Toileting: Teacher, adult education: Furniture conservator/restorer Method: Surveyor, minerals: Engineer, technical sales: Unable to assess Tub/Shower Transfer Method:  Unable to assess Film/video editor: Unable to assess Film/video editor Method: Unable to assess Vision Eye Alignment: Within Functional Limits Perception  Perception: Impaired Inattention/Neglect:  (attention improved since eval) Praxis Praxis: Impaired Cognition Cognition Overall Cognitive Status: Impaired/Different from baseline Arousal/Alertness: Awake/alert Orientation Level: Person;Place;Situation Person: Oriented Place: Oriented Situation: Oriented Memory: Impaired Sustained Attention: Impaired Awareness: Impaired Reasoning: Impaired Behaviors: Impulsive Safety/Judgment: Impaired Comments: decreased insight and awareness Brief Interview for Mental Status (BIMS) Repetition of Three Words (First Attempt): 3 Temporal Orientation: Year: Correct Temporal Orientation: Month: Accurate within 5 days Temporal Orientation: Day: Correct Recall: "Sock": Yes, no cue required Recall: "Blue": Yes, no cue required Recall: "Bed": Yes, no cue required BIMS Summary Score: 15 Sensation Sensation Light Touch: Impaired Detail Coordination Gross Motor Movements are Fluid and Coordinated: No Fine Motor Movements are Fluid and Coordinated: No Coordination and Movement Description: R hemi, R inattention Motor  Motor Motor: Hemiplegia;Abnormal postural alignment and control Mobility  Bed Mobility Supine to Sit: Supervision/Verbal cueing Sit to Supine: Supervision/Verbal cueing Transfers Sit to Stand: Independent with assistive device Stand to Sit: Independent with assistive device  Trunk/Postural Assessment  Postural Control Postural Control: Deficits on evaluation Trunk Control: posterior and right lateral lean  Balance Static Sitting Balance Static Sitting - Balance Support: Feet supported;No upper extremity supported Static Sitting - Level of Assistance: 5: Stand by assistance Dynamic Sitting Balance Dynamic Sitting - Balance Support: Feet supported;No upper  extremity supported Dynamic Sitting - Level of Assistance: 5: Stand by assistance Static Standing Balance Static Standing - Balance Support: During functional activity Static Standing - Level of Assistance: 5: Stand by assistance Dynamic Standing Balance Dynamic Standing - Level of Assistance: 5: Stand by assistance Extremity/Trunk Assessment RUE Assessment RUE Body System: Neuro Brunstrum levels for arm and hand: Arm;Hand Brunstrum level for arm: Stage V Relative Independence from Synergy Brunstrum level for hand: Stage V Independence from basic synergies LUE Assessment  LUE Assessment: Within Functional Limits   [name] 02/27/2023, 11:09 AM

## 2023-02-27 NOTE — Progress Notes (Addendum)
[facility] [address] [place], [place] [address] [phone]  Date:  02/28/2023   Name:  Loretta Gates   DOB:  11/18/1935   MRN:  [id number]  PCP:  Pearline Cables, MD    Chief Complaint: Hospitalization Follow-up (02/13/23 - 5/19 Stroke [facility]): 1. Tylenol, Eliquis, Questran, Mag-Ox/Concerns/ questions: possible UTI, some burning with urination x 1.5 -2 weeks. 2. Pt has restarted Lasix per Dr Carlis Abbott (PT), they would like to reasure that this is okay. /)   History of Present Illness:  Loretta Gates is a 87 y.o. 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 right-handed female with history of atrial fibrillation on Eliquis and failed cardioversions, hypertension, bladder cancer status post TURBT 02/05/2023, diastolic congestive heart failure, chronic venous insufficiency, left breast cancer with chemotherapy mastectomy, remote head and neck radiation 1940s AJ, left thyroidectomy and inferior parathyroidectomy with chronic anemia.  Per chart review lives with spouse uses straight cane prior to admission.  Presented 02/06/2023 with intermittent transient left eye vision.  Cranial CT scan showed acute subarachnoid hemorrhage in the left sylvian fissure and over the left convexity.  MRA/MRI showed acute left MCA territory infarction involving large portions of the left left parietal and  temporal lobes with additional small acute infarcts also present in the left cerebellum and left occipital lobe as well as redemonstrated subarachnoid hemorrhage and left sylvian fissure as well as left ICA occlusion.  Interventional radiology follow-up for nonocclusive clot with carotid stenting.  Neurology follow-up maintained initially on low-dose aspirin and Brilinta twice daily and plan to resume Eliquis after SAH/ICH resolved in 7 to 10 days.  Lovenox initiated for DVT prophylaxis.  Therapy evaluations completed due to patient decreased functional mobility was admitted for a comprehensive rehab program.   Hospital Course: Loretta Gates was admitted to rehab 02/13/2023 for inpatient therapies to consist of PT, ST and OT at least three hours five days a week. Past admission physiatrist, therapy team and rehab RN have worked together to provide customized collaborative inpatient rehab.  Pertaining to patient's large left MCA stroke/SAH due to left ICA occlusion and MCA nonocclusive clot status post stenting.  Initially on low-dose aspirin and Brilinta.  Follow-up cranial CT scan completed reviewed by neurology services and low-dose aspirin has since been discontinued Eliquis resumed she would continue on Brilinta with Eliquis until follow-up with neurology services.  Atrial fibrillation with history of failed cardioversions Lopressor as indicated cardiac rate controlled.  Eliquis has been resumed.  Follow-up outpatient cardiology services.  Diastolic congestive heart failure exhibiting no signs of fluid overload and patient remained on Jardiance.  Morbid obesity BMI 37.16 dietary follow-up.  Chronic anemia no bleeding episodes and monitored.  Prediabetes blood sugars discontinued as well as insulin with diet as advised.  Bouts of constipation resolved with laxative assistance.  She has an appointment  with Dr. Jens Som next month ?  Neurology follow-up- not set up yet.  I reached out to Dr Pearlean Brownie to expedite  this  She is concerned about a possible UTI which we will follow-up- sx are vague, maybe some dysris Loretta Gates is living at home with her daughter [name]  There are 3 steps into the home, she does have a ground floor bedroom and bathroom  Using a walker at home and she is able to ambulate adequately No falls since she got home  She is able to chew and swallow ok  Speech is easy to understand, she has more difficulty thinking of the words she would like to say Her right side was affected but she is left handed  She was using lasix daily up until her recent admission, was stopped on DC home- her daughter is concerned that she may get fluid overloaded again and had her start back on it yesterday She has previously taken 20 mg daily long term    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   ICAO (internal carotid artery occlusion), left 02/06/2023   Chest pain 12/08/2022   Class 3 obesity (HCC) 10/21/2022   Acute on chronic diastolic CHF (congestive heart failure) (HCC) 10/20/2022   IDA (iron deficiency anemia) 01/13/2022   Atrial fibrillation (HCC) 01/05/2022   Breast cancer (HCC) 01/05/2022   Primary hyperparathyroidism (HCC) 01/05/2022   Essential hypertension 01/05/2022   Age-related nuclear cataract of both eyes 12/11/2018   Abnormal INR 01/29/2018   Long term (current) use of anticoagulants 01/29/2018   Encounter for therapeutic drug monitoring 01/29/2018   Vitamin D deficiency 05/03/2017   S/P laparoscopic cholecystectomy 02/26/2017   Calculus of gallbladder without cholecystitis without obstruction 02/23/2017   Diarrhea of presumed infectious origin 02/23/2017   Hyperglycemia 02/23/2017   Right upper quadrant abdominal pain 02/23/2017   Lower extremity edema 01/18/2016   Age-related osteoporosis without current pathological fracture  01/17/2016   Diverticulosis of large intestine without hemorrhage 01/17/2016   GERD (gastroesophageal reflux disease) 01/17/2016   History of left breast cancer 01/17/2016   Multinodular goiter 01/17/2016   OAB (overactive bladder) 01/17/2016   History of cancer chemotherapy 03/06/2015   History of left mastectomy 03/06/2015   Malignant neoplasm of overlapping sites of left female breast (HCC) 03/06/2015    Past Medical History:  Diagnosis Date   Anemia    Bladder cancer [facility])    urologist--- dr pace   Chronic diastolic (congestive) heart failure (HCC)    followed by dr Jens Som   Chronic venous insufficiency    Edema of both lower extremities    per pt wears compression hose   GERD (gastroesophageal reflux disease)    History of cancer chemotherapy    completed 2008 for left breast cancer   History of head and neck radiation 1946   per pt approx 1946 or 1947  (age 13) due to profound bilateral hearing loss told from scarlett fever/ measles,  once weekly for several weeks had  head/ neck radiation,  hearing was restored without neededing hearing aids   History of left breast cancer 2008   malignant neoplasm of overlapping sites of left breast, ER+;  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 patel;  2006  s/p left thyroidectomy w/ right inferior parathyoidectomy  (per path speciman marked parathyroid but only thyroid tissue)   Hypertension    IDA (iron deficiency anemia)    hematology/ oncologist--- dr ennever/ sarah carter NP;  treated w/ IV iron infusions   Multinodular thyroid    Neuropathy    mild hands/ feet, uses cane   Nocturia more than twice per night    Nocturnal leg cramps    OA (osteoarthritis)    knees   OAB (overactive bladder)    Osteoporosis    PAF (paroxysmal atrial fibrillation) [facility])    cardiologist--- dr Jens Som   PONV  (postoperative nausea and vomiting)    Vitamin D deficiency    Wears glasses     Past Surgical History:  Procedure Laterality Date   BREAST LUMPECTOMY WITH RADIOACTIVE SEED LOCALIZATION Right 10/25/2021   Procedure: RIGHT BREAST LUMPECTOMY WITH RADIOACTIVE SEED LOCALIZATION;  Surgeon: Harriette Bouillon, MD;  Location: [facility];  Service: General;  Laterality: Right;   CARDIOVERSION N/A 01/01/2023   Procedure: CARDIOVERSION;  Surgeon: [name], MD;  Location: [facility] ENDOSCOPY;  Service: Cardiovascular;  Laterality: N/A;   CHOLECYSTECTOMY, LAPAROSCOPIC  02/25/2017   @[facility]    COLONOSCOPY WITH ESOPHAGOGASTRODUODENOSCOPY (EGD)  2022   IR ANGIO INTRA EXTRACRAN SEL COM CAROTID INNOMINATE UNI R MOD SED  02/07/2023   IR ANGIO VERTEBRAL SEL SUBCLAVIAN INNOMINATE UNI L MOD SED  02/07/2023   IR CT HEAD LTD  02/07/2023   IR INTRAVSC STENT CERV CAROTID W/O EMB-PROT MOD SED INC ANGIO  02/07/2023   IR PERCUTANEOUS ART THROMBECTOMY/INFUSION INTRACRANIAL INC DIAG ANGIO  02/07/2023   MASTECTOMY WITH AXILLARY LYMPH NODE DISSECTION Left 03/05/2007   @ [facility]   OVARIAN CYST SURGERY     age 14;   abdominal   RADIOLOGY WITH ANESTHESIA N/A 02/06/2023   Procedure: IR WITH ANESTHESIA;  Surgeon: Radiologist, Medication, MD;  Location: [facility] OR;  Service: Radiology;  Laterality: N/A;   THYROIDECTOMY, PARTIAL Left 2006   left lobectomy and right infertior parathyroidectomy   TONSILLECTOMY     child    Social History   Tobacco Use   Smoking status: Never   Smokeless tobacco: Never  Vaping Use   Vaping Use: Never used  Substance Use Topics   Alcohol use: Never   Drug use: Never    Family History  Problem Relation Age of Onset   Sudden death Neg Hx     Allergies  Allergen Reactions   Bee Venom Swelling    Bee stung her on the inside of her gum. Lips and gums started swelling. No SOB.   Levaquin [Levofloxacin] Hives and Itching   Peanut-Containing Drug Products Other (See Comments)    Stomach  pain and diarrhea     Medication list has been reviewed and updated.  Current Outpatient Medications on File Prior to Visit  Medication Sig Dispense Refill   acetaminophen (TYLENOL) 325 MG tablet Take 2 tablets (650 mg total) by mouth every 4 (four) hours as needed for mild pain (or temp > 37.5 C (99.5 F)).     apixaban (ELIQUIS) 5 MG TABS tablet Take 1 tablet (5 mg total) by mouth 2 (two) times daily. 60 tablet    cholestyramine (QUESTRAN) 4 g packet Take 1 packet (4 g total) by mouth daily at 6 PM. 30 each 0   cinacalcet (SENSIPAR) 30 MG tablet Take 1 tablet (30 mg total) by mouth daily with breakfast. 30 tablet 0   empagliflozin (  JARDIANCE) 10 MG TABS tablet TAKE ONE TABLET BY MOUTH ONE TIME DAILY (Patient taking differently: Take 10 mg by mouth daily.) 30 tablet 3   ergocalciferol (VITAMIN D2) 1.25 MG (50000 UT) capsule Take 50,000 Units by mouth every 14 (fourteen) days. Every other saturday     magnesium oxide (MAG-OX) 400 (240 Mg) MG tablet Take 1 tablet (400 mg total) by mouth daily. 30 tablet 0   Metoprolol Tartrate (LOPRESSOR) 50 MG tablet Take 1.5 tablets (75 mg total) by mouth 2 (two) times daily. 45 tablet 0   omeprazole (PRILOSEC) 40 MG capsule Take 1 capsule (40 mg total) by mouth 2 (two) times daily before lunch and supper. (Patient taking differently: Take 40 mg by mouth in the morning and at bedtime.) 180 capsule 1   oxybutynin (DITROPAN XL) 15 MG 24 hr tablet Take 1 tablet (15 mg total) by mouth at bedtime. (Patient taking differently: Take 15 mg by mouth at bedtime.) 90 tablet 3   ticagrelor (BRILINTA) 90 MG TABS tablet Take 0.5 tablets (45 mg total) by mouth 2 (two) times daily. 60 tablet 2   No current facility-administered medications on file prior to visit.    Review of Systems:  As per HPI- otherwise negative.   Physical Examination: Vitals:   02/28/23 1522  BP: 132/62  Pulse: 68  Resp: 18  Temp: 98.3 F (36.8 C)  SpO2: 96%   Vitals:   02/28/23 1522   Weight: 181 lb 9.6 oz (82.4 kg)  Height: 4\' 10"  (1.473 m)   Body mass index is 37.95 kg/m. Ideal Body Weight: Weight in (lb) to have BMI = 25: 119.4  GEN: no acute distress.  Obese, looks well and comfortable Speech is somewhat slow but easy to understand.  No facial drooping or word slurring noted HEENT: Atraumatic, Normocephalic.  Ears and Nose: No external deformity. CV: RRR, No M/G/R. No JVD. No thrill. No extra heart sounds. PULM: CTA B, no wheezes, crackles, rhonchi. No retractions. No resp. distress. No accessory muscle use. ABD: S, NT, ND EXTR: No c/c/e PSYCH: Normally interactive. Conversant.  Decreased strength in right hand.  She is able to get to her feet with minimal assistance and take few steps on her own, gait is slow and steps are short  Wt Readings from Last 3 Encounters:  02/28/23 181 lb 9.6 oz (82.4 kg)  02/25/23 178 lb 5.6 oz (80.9 kg)  02/06/23 184 lb (83.5 kg)    Results for orders placed or performed in visit on 02/28/23  POCT urinalysis dipstick  Result Value Ref Range   Color, UA yellow yellow   Clarity, UA cloudy (A) clear   Glucose, UA >=1,000 (A) negative mg/dL   Bilirubin, UA negative negative   Ketones, POC UA negative negative mg/dL   Spec Grav, UA 4.098 1.191 - 1.025   Blood, UA moderate (A) negative   pH, UA 5.0 5.0 - 8.0   Protein Ur, POC =100 (A) negative mg/dL   Urobilinogen, UA 0.2 0.2 or 1.0 E.U./dL   Nitrite, UA Negative Negative   Leukocytes, UA Large (3+) (A) Negative     Assessment and Plan: Dysuria - Plan: Urine Culture, POCT urinalysis dipstick, Urine Microscopic, cephALEXin (KEFLEX) 500 MG capsule  Essential hypertension  Chronic heart failure with preserved ejection fraction (HCC)  ICAO (internal carotid artery occlusion), left  H/O ischemic left MCA stroke   Patient seen today for follow-up from recent hospital and then rehab stay following a stroke.  She had a  significant left MCA stroke and also subarachnoid  hemorrhage, treatment for left internal carotid artery occlusion during this hospital admission She made good progress during rehab, home physical therapy will start tomorrow Advised patient and her daughter that we hope she will continue to see improvements over the next several months. We discussed her blood pressure and history of heart failure.  Daughter is concerned that stopping her routine furosemide might lead to heart failure exacerbation.  I encouraged her to restart daily weights which will help monitor for fluid overload For the time being we decided to have her use furosemide every other day which is a compromise they feel comfortable with until her upcoming cardiology appointment Encouraged adequate dietary potassium intake Suspect UTI from urinalysis today, urine culture pending.  Start on Keflex while culture pending  Follow-up with me 8-10 weeks    Signed Abbe Amsterdam, MD  Urine culture came in 5/24- will change from keflex to amox  She notes her mom is doing well Advised regarding change as above, she states understading Results for orders placed or performed in visit on 02/28/23  Urine Culture   Specimen: Urine  Result Value Ref Range   MICRO NUMBER: [id number]    SPECIMEN QUALITY: Adequate    Sample Source URINE    STATUS: FINAL    ISOLATE 1: Enterococcus faecalis (A)       Susceptibility   Enterococcus faecalis - URINE CULTURE POSITIVE 1    AMPICILLIN <=2 Sensitive     VANCOMYCIN 1 Sensitive     NITROFURANTOIN* <=16 Sensitive      * Legend: S = Susceptible  I = Intermediate R = Resistant  NS = Not susceptible * = Not tested  NR = Not reported **NN = See antimicrobic comments   POCT urinalysis dipstick  Result Value Ref Range   Color, UA yellow yellow   Clarity, UA cloudy (A) clear   Glucose, UA >=1,000 (A) negative mg/dL   Bilirubin, UA negative negative   Ketones, POC UA negative negative mg/dL   Spec Grav, UA 5.409 8.119 - 1.025   Blood, UA  moderate (A) negative   pH, UA 5.0 5.0 - 8.0   Protein Ur, POC =100 (A) negative mg/dL   Urobilinogen, UA 0.2 0.2 or 1.0 E.U./dL   Nitrite, UA Negative Negative   Leukocytes, UA Large (3+) (A) Negative

## 2023-02-28 ENCOUNTER — Telehealth: Payer: Self-pay | Admitting: Family Medicine

## 2023-02-28 ENCOUNTER — Ambulatory Visit (INDEPENDENT_AMBULATORY_CARE_PROVIDER_SITE_OTHER): Payer: Medicare HMO | Admitting: Family Medicine

## 2023-02-28 VITALS — BP 132/62 | HR 68 | Temp 98.3°F | Resp 18 | Ht <= 58 in | Wt 181.6 lb

## 2023-02-28 DIAGNOSIS — R300 Dysuria: Secondary | ICD-10-CM | POA: Diagnosis not present

## 2023-02-28 DIAGNOSIS — I10 Essential (primary) hypertension: Secondary | ICD-10-CM | POA: Diagnosis not present

## 2023-02-28 DIAGNOSIS — I6522 Occlusion and stenosis of left carotid artery: Secondary | ICD-10-CM

## 2023-02-28 DIAGNOSIS — I5032 Chronic diastolic (congestive) heart failure: Secondary | ICD-10-CM

## 2023-02-28 DIAGNOSIS — Z8673 Personal history of transient ischemic attack (TIA), and cerebral infarction without residual deficits: Secondary | ICD-10-CM

## 2023-02-28 LAB — POCT URINALYSIS DIP (MANUAL ENTRY)
Bilirubin, UA: NEGATIVE
Glucose, UA: >=1000 mg/dL — AB
Ketones, POC UA: NEGATIVE mg/dL
Nitrite, UA: NEGATIVE
Protein Ur, POC: 100 mg/dL — AB
Spec Grav, UA: 1.02 (ref 1.010–1.025)
Urobilinogen, UA: 0.2 E.U./dL
pH, UA: 5 (ref 5.0–8.0)

## 2023-02-28 MED ORDER — CEPHALEXIN 500 MG PO CAPS
500.0000 mg | ORAL_CAPSULE | Freq: Two times a day (BID) | ORAL | 0 refills | Status: DC
Start: 2023-02-28 — End: 2023-03-02

## 2023-02-28 NOTE — Patient Instructions (Addendum)
It was great to see you today Why don't we have you take the furosemide every OTHER day.  Monitor weight- if you have a sudden increase of more than 2 lbs we can increase furosemide to daily until weight comes back to normal  Try to include at least 2 high potassium foods in your daily diet to balance out potassium losses from furosemide I will be in touch with your urine culture  We will treat you for a UTI with keflex while culture is pending   Please see me in 8- 10 weeks for recheck

## 2023-02-28 NOTE — Telephone Encounter (Signed)
Sheralyn Boatman, Child psychotherapist with [facility], called to advise that they will open her for hh services effective 03/01/23.

## 2023-03-01 DIAGNOSIS — H2513 Age-related nuclear cataract, bilateral: Secondary | ICD-10-CM | POA: Diagnosis not present

## 2023-03-01 DIAGNOSIS — C679 Malignant neoplasm of bladder, unspecified: Secondary | ICD-10-CM | POA: Diagnosis not present

## 2023-03-01 DIAGNOSIS — D509 Iron deficiency anemia, unspecified: Secondary | ICD-10-CM | POA: Diagnosis not present

## 2023-03-01 DIAGNOSIS — E213 Hyperparathyroidism, unspecified: Secondary | ICD-10-CM | POA: Diagnosis not present

## 2023-03-01 DIAGNOSIS — N319 Neuromuscular dysfunction of bladder, unspecified: Secondary | ICD-10-CM | POA: Diagnosis not present

## 2023-03-01 DIAGNOSIS — I5033 Acute on chronic diastolic (congestive) heart failure: Secondary | ICD-10-CM | POA: Diagnosis not present

## 2023-03-01 DIAGNOSIS — Z7901 Long term (current) use of anticoagulants: Secondary | ICD-10-CM | POA: Diagnosis not present

## 2023-03-01 DIAGNOSIS — I69320 Aphasia following cerebral infarction: Secondary | ICD-10-CM | POA: Diagnosis not present

## 2023-03-01 DIAGNOSIS — G629 Polyneuropathy, unspecified: Secondary | ICD-10-CM | POA: Diagnosis not present

## 2023-03-01 DIAGNOSIS — H5340 Unspecified visual field defects: Secondary | ICD-10-CM | POA: Diagnosis not present

## 2023-03-01 DIAGNOSIS — K219 Gastro-esophageal reflux disease without esophagitis: Secondary | ICD-10-CM | POA: Diagnosis not present

## 2023-03-01 DIAGNOSIS — I872 Venous insufficiency (chronic) (peripheral): Secondary | ICD-10-CM | POA: Diagnosis not present

## 2023-03-01 DIAGNOSIS — I69398 Other sequelae of cerebral infarction: Secondary | ICD-10-CM | POA: Diagnosis not present

## 2023-03-01 DIAGNOSIS — I480 Paroxysmal atrial fibrillation: Secondary | ICD-10-CM | POA: Diagnosis not present

## 2023-03-01 DIAGNOSIS — K5730 Diverticulosis of large intestine without perforation or abscess without bleeding: Secondary | ICD-10-CM | POA: Diagnosis not present

## 2023-03-01 DIAGNOSIS — I69351 Hemiplegia and hemiparesis following cerebral infarction affecting right dominant side: Secondary | ICD-10-CM | POA: Diagnosis not present

## 2023-03-01 DIAGNOSIS — M170 Bilateral primary osteoarthritis of knee: Secondary | ICD-10-CM | POA: Diagnosis not present

## 2023-03-01 DIAGNOSIS — C50812 Malignant neoplasm of overlapping sites of left female breast: Secondary | ICD-10-CM | POA: Diagnosis not present

## 2023-03-01 DIAGNOSIS — Z792 Long term (current) use of antibiotics: Secondary | ICD-10-CM | POA: Diagnosis not present

## 2023-03-01 DIAGNOSIS — E042 Nontoxic multinodular goiter: Secondary | ICD-10-CM | POA: Diagnosis not present

## 2023-03-01 DIAGNOSIS — M810 Age-related osteoporosis without current pathological fracture: Secondary | ICD-10-CM | POA: Diagnosis not present

## 2023-03-01 DIAGNOSIS — I110 Hypertensive heart disease with heart failure: Secondary | ICD-10-CM | POA: Diagnosis not present

## 2023-03-01 DIAGNOSIS — R7303 Prediabetes: Secondary | ICD-10-CM | POA: Diagnosis not present

## 2023-03-02 ENCOUNTER — Telehealth: Payer: Self-pay | Admitting: Family Medicine

## 2023-03-02 LAB — URINE CULTURE
MICRO NUMBER:: 14990164
SPECIMEN QUALITY:: ADEQUATE

## 2023-03-02 MED ORDER — AMOXICILLIN 500 MG PO CAPS: 500.0000 mg | ORAL_CAPSULE | Freq: Two times a day (BID) | ORAL | 0 refills | Status: DC

## 2023-03-02 NOTE — Addendum Note (Signed)
Addended by: Abbe Amsterdam C on: 03/02/2023 01:20 PM   Modules accepted: Orders

## 2023-03-02 NOTE — Telephone Encounter (Signed)
Caller/Agency: [facility]ino D[facility]) Callback Number: [phone] Requesting OT/PT/Skilled Nursing/Social Work/Speech Therapy: PT Frequency: 1 w 2, 2 w 4, 1 w 3

## 2023-03-06 ENCOUNTER — Other Ambulatory Visit: Payer: Self-pay

## 2023-03-06 DIAGNOSIS — R600 Localized edema: Secondary | ICD-10-CM

## 2023-03-06 MED ORDER — FUROSEMIDE 20 MG PO TABS
ORAL_TABLET | ORAL | 3 refills | Status: DC
Start: 2023-03-06 — End: 2023-04-17

## 2023-03-06 NOTE — Telephone Encounter (Signed)
Orders have been given to [facility].

## 2023-03-07 DIAGNOSIS — E042 Nontoxic multinodular goiter: Secondary | ICD-10-CM | POA: Diagnosis not present

## 2023-03-07 DIAGNOSIS — K219 Gastro-esophageal reflux disease without esophagitis: Secondary | ICD-10-CM | POA: Diagnosis not present

## 2023-03-07 DIAGNOSIS — K5730 Diverticulosis of large intestine without perforation or abscess without bleeding: Secondary | ICD-10-CM | POA: Diagnosis not present

## 2023-03-07 DIAGNOSIS — R7303 Prediabetes: Secondary | ICD-10-CM | POA: Diagnosis not present

## 2023-03-07 DIAGNOSIS — M170 Bilateral primary osteoarthritis of knee: Secondary | ICD-10-CM | POA: Diagnosis not present

## 2023-03-07 DIAGNOSIS — I69351 Hemiplegia and hemiparesis following cerebral infarction affecting right dominant side: Secondary | ICD-10-CM | POA: Diagnosis not present

## 2023-03-07 DIAGNOSIS — C50812 Malignant neoplasm of overlapping sites of left female breast: Secondary | ICD-10-CM | POA: Diagnosis not present

## 2023-03-07 DIAGNOSIS — I872 Venous insufficiency (chronic) (peripheral): Secondary | ICD-10-CM | POA: Diagnosis not present

## 2023-03-07 DIAGNOSIS — D509 Iron deficiency anemia, unspecified: Secondary | ICD-10-CM | POA: Diagnosis not present

## 2023-03-07 DIAGNOSIS — H5340 Unspecified visual field defects: Secondary | ICD-10-CM | POA: Diagnosis not present

## 2023-03-07 DIAGNOSIS — M810 Age-related osteoporosis without current pathological fracture: Secondary | ICD-10-CM | POA: Diagnosis not present

## 2023-03-07 DIAGNOSIS — N319 Neuromuscular dysfunction of bladder, unspecified: Secondary | ICD-10-CM | POA: Diagnosis not present

## 2023-03-07 DIAGNOSIS — G629 Polyneuropathy, unspecified: Secondary | ICD-10-CM | POA: Diagnosis not present

## 2023-03-07 DIAGNOSIS — E213 Hyperparathyroidism, unspecified: Secondary | ICD-10-CM | POA: Diagnosis not present

## 2023-03-07 DIAGNOSIS — I480 Paroxysmal atrial fibrillation: Secondary | ICD-10-CM | POA: Diagnosis not present

## 2023-03-07 DIAGNOSIS — I5033 Acute on chronic diastolic (congestive) heart failure: Secondary | ICD-10-CM | POA: Diagnosis not present

## 2023-03-07 DIAGNOSIS — I110 Hypertensive heart disease with heart failure: Secondary | ICD-10-CM | POA: Diagnosis not present

## 2023-03-07 DIAGNOSIS — I69398 Other sequelae of cerebral infarction: Secondary | ICD-10-CM | POA: Diagnosis not present

## 2023-03-07 DIAGNOSIS — Z792 Long term (current) use of antibiotics: Secondary | ICD-10-CM | POA: Diagnosis not present

## 2023-03-07 DIAGNOSIS — C679 Malignant neoplasm of bladder, unspecified: Secondary | ICD-10-CM | POA: Diagnosis not present

## 2023-03-07 DIAGNOSIS — H2513 Age-related nuclear cataract, bilateral: Secondary | ICD-10-CM | POA: Diagnosis not present

## 2023-03-07 DIAGNOSIS — Z7901 Long term (current) use of anticoagulants: Secondary | ICD-10-CM | POA: Diagnosis not present

## 2023-03-07 DIAGNOSIS — I69320 Aphasia following cerebral infarction: Secondary | ICD-10-CM | POA: Diagnosis not present

## 2023-03-08 ENCOUNTER — Ambulatory Visit (HOSPITAL_COMMUNITY)
Admission: RE | Admit: 2023-03-08 | Discharge: 2023-03-08 | Disposition: A | Discharge status: Home / Self Care | Payer: Medicare HMO | Source: Ambulatory Visit | Attending: Student | Admitting: Student

## 2023-03-08 ENCOUNTER — Telehealth: Payer: Self-pay | Admitting: Family Medicine

## 2023-03-08 DIAGNOSIS — Z5189 Encounter for other specified aftercare: Secondary | ICD-10-CM | POA: Diagnosis not present

## 2023-03-08 DIAGNOSIS — I6602 Occlusion and stenosis of left middle cerebral artery: Secondary | ICD-10-CM

## 2023-03-08 NOTE — Telephone Encounter (Signed)
[facility]) called stating pt missed speech therapy eval and were looking to move it to next week on 6.4.24.

## 2023-03-09 ENCOUNTER — Telehealth: Payer: Self-pay | Admitting: Family Medicine

## 2023-03-09 ENCOUNTER — Encounter: Payer: Self-pay | Admitting: Registered Nurse

## 2023-03-09 ENCOUNTER — Encounter (HOSPITAL_BASED_OUTPATIENT_CLINIC_OR_DEPARTMENT_OTHER): Payer: Medicare HMO | Admitting: Registered Nurse

## 2023-03-09 VITALS — BP 131/86 | HR 80 | Ht <= 58 in

## 2023-03-09 DIAGNOSIS — M170 Bilateral primary osteoarthritis of knee: Secondary | ICD-10-CM | POA: Diagnosis not present

## 2023-03-09 DIAGNOSIS — I63512 Cerebral infarction due to unspecified occlusion or stenosis of left middle cerebral artery: Secondary | ICD-10-CM | POA: Diagnosis not present

## 2023-03-09 DIAGNOSIS — M810 Age-related osteoporosis without current pathological fracture: Secondary | ICD-10-CM | POA: Diagnosis not present

## 2023-03-09 DIAGNOSIS — I6602 Occlusion and stenosis of left middle cerebral artery: Secondary | ICD-10-CM | POA: Diagnosis not present

## 2023-03-09 DIAGNOSIS — K219 Gastro-esophageal reflux disease without esophagitis: Secondary | ICD-10-CM | POA: Diagnosis not present

## 2023-03-09 DIAGNOSIS — E042 Nontoxic multinodular goiter: Secondary | ICD-10-CM | POA: Diagnosis not present

## 2023-03-09 DIAGNOSIS — I69320 Aphasia following cerebral infarction: Secondary | ICD-10-CM | POA: Diagnosis not present

## 2023-03-09 DIAGNOSIS — I69398 Other sequelae of cerebral infarction: Secondary | ICD-10-CM | POA: Diagnosis not present

## 2023-03-09 DIAGNOSIS — C679 Malignant neoplasm of bladder, unspecified: Secondary | ICD-10-CM | POA: Diagnosis not present

## 2023-03-09 DIAGNOSIS — I872 Venous insufficiency (chronic) (peripheral): Secondary | ICD-10-CM | POA: Diagnosis not present

## 2023-03-09 DIAGNOSIS — I480 Paroxysmal atrial fibrillation: Secondary | ICD-10-CM | POA: Diagnosis not present

## 2023-03-09 DIAGNOSIS — H5340 Unspecified visual field defects: Secondary | ICD-10-CM | POA: Diagnosis not present

## 2023-03-09 DIAGNOSIS — H2513 Age-related nuclear cataract, bilateral: Secondary | ICD-10-CM | POA: Diagnosis not present

## 2023-03-09 DIAGNOSIS — I5033 Acute on chronic diastolic (congestive) heart failure: Secondary | ICD-10-CM | POA: Diagnosis not present

## 2023-03-09 DIAGNOSIS — C50812 Malignant neoplasm of overlapping sites of left female breast: Secondary | ICD-10-CM | POA: Diagnosis not present

## 2023-03-09 DIAGNOSIS — K5730 Diverticulosis of large intestine without perforation or abscess without bleeding: Secondary | ICD-10-CM | POA: Diagnosis not present

## 2023-03-09 DIAGNOSIS — Z792 Long term (current) use of antibiotics: Secondary | ICD-10-CM | POA: Diagnosis not present

## 2023-03-09 DIAGNOSIS — I69351 Hemiplegia and hemiparesis following cerebral infarction affecting right dominant side: Secondary | ICD-10-CM | POA: Diagnosis not present

## 2023-03-09 DIAGNOSIS — I4891 Unspecified atrial fibrillation: Secondary | ICD-10-CM | POA: Diagnosis not present

## 2023-03-09 DIAGNOSIS — N319 Neuromuscular dysfunction of bladder, unspecified: Secondary | ICD-10-CM | POA: Diagnosis not present

## 2023-03-09 DIAGNOSIS — I110 Hypertensive heart disease with heart failure: Secondary | ICD-10-CM | POA: Diagnosis not present

## 2023-03-09 DIAGNOSIS — G629 Polyneuropathy, unspecified: Secondary | ICD-10-CM | POA: Diagnosis not present

## 2023-03-09 DIAGNOSIS — E213 Hyperparathyroidism, unspecified: Secondary | ICD-10-CM | POA: Diagnosis not present

## 2023-03-09 DIAGNOSIS — Z7901 Long term (current) use of anticoagulants: Secondary | ICD-10-CM | POA: Diagnosis not present

## 2023-03-09 DIAGNOSIS — D509 Iron deficiency anemia, unspecified: Secondary | ICD-10-CM | POA: Diagnosis not present

## 2023-03-09 DIAGNOSIS — R7303 Prediabetes: Secondary | ICD-10-CM | POA: Diagnosis not present

## 2023-03-09 NOTE — Telephone Encounter (Signed)
Caller/Agency: [employer]  Callback Number: [phone] (ok to lvm) Requesting OT/PT/Skilled Nursing/Social Work/Speech Therapy: OT  Frequency: 1x8

## 2023-03-09 NOTE — Progress Notes (Signed)
Subjective:    Patient ID: Loretta Gates, female    DOB: 07-20-1936, 87 y.o.   MRN: [id number]  HPI: Loretta Gates is a 87 y.o. female who is here for Transitional Care Visit for Follow up of her Left middle cerebral artery stroke and Atrial Fibrillation. She presented to [facility] on 02/06/2023 with complaint of intermittent transient left eye vision loss.   Dr Jeanine Luz Note: 02/06/2023 CC: Intermittent transient left eye vision loss   History is obtained from: Patient and chart review   HPI: Loretta Gates is a 87 y.o. female with a past medical history significant for atrial fibrillation on Eliquis (last dose 4/30 a.m. s/p failed cardioversion 1 to 2 months ago), hypertension bladder cancer s/p TURBT (02/05/2023), heart failure, chronic venous insufficiency, left breast cancer s/p chemotherapy and mastectomy (2008), remote head/neck radiation (1940s at age 89), left thyroidectomy and right inferior parathyroidectomy, anemia, mild neuropathy   She first had an episode on Sunday at 5 PM lasting 45 seconds of her vision changing in the right eye, resolving to baseline after 3 minutes with 2 additional spells today (2 PM and 6:10 PM) both lasting a few minutes, with full return to baseline as of 6:14 PM.  She denies any other focal neurological symptoms lately.  She does report some mild hematuria today after her transurethral resection of bladder tumor with postoperative gemcitabine yesterday.  Otherwise on brief review of systems prior to emergent procedure, denies any other symptoms.   Due to recurrent symptoms at [facility] she was emergently evaluated by video neurology and decision was made to emergently assess her left carotid occlusion with potential for stent placement due to concern for stump embolization versus collateral failure versus acute occlusion.  Case was discussed with Dr. Otelia Limes (Teleneurologist), Dr. Rush Landmark ED provider, Dr. Corliss Skains neurointerventional radiology,  Dr. Roda Shutters of stroke team, and myself   CT Head: WO Contrast:  IMPRESSION: Acute subarachnoid hemorrhage in the left Sylvian fissure and over the left convexity. Unchanged compared to the intraprocedural CT obtained during the earlier procedure.  CT Angio: Head and Neck:  IMPRESSION: 1. 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 No other intracranial large vessel occlusion or significant stenosis. 3. No hemodynamically significant stenosis in the neck. 4. 3 mm subsolid nodule in the right upper lobe. No follow-up needed if patient is low-risk.This recommendation follows the consensus statement: Guidelines for Management of Incidental Pulmonary Nodules Detected on CT Images: From the Fleischner Society 2017; Radiology 2017; 284:228-243. 5. Aortic atherosclerosis.   Aortic Atherosclerosis (ICD10-I70.0).   MR Brain WO Contrast: MRA IMPRESSION: 1. Acute left MCA territory infarct involving large portions of the left parietal and temporal lobes, and to a lesser degree the left frontal lobe and insula. 2. Additional small acute infarcts are also present in the left cerebellum and the left occipital lobe, which raises the possibility for a central embolic phenomenon. 3. Redemonstrated subarachnoid hemorrhage in the left Sylvian fissure and over the posterior left frontal lobe. 4. Flow signal is now seen in the cervical, petrous, cavernous, and supraclinoid segments of the left ICA. There is a small filling defect of the petrous segment of the left ICA, which could represent residual thrombus or possibly a dissection. Consider CTA head for further evaluation.  Neurology following: she was maintained initially  on low dose aspirin and Brilinta, low does aspirin was discontinued  and she resumed  Eliquis and continue with Brilinta. .   Loretta Gates was admitted to inpatient rehabilitation on 02/13/2023 and discharged home on 02/25/2023. She is receiving Home Health Therapy with [facility].   Loretta Gates denies any pain at this time. She rates her pain 0. Also reports she has a good appetire  Daughter in room, all questions answered.   Pain Inventory Average Pain 0 Pain Right Now 0 My pain is  No pain  LOCATION OF PAIN  No pain  BOWEL Number of stools per week: 7   BLADDER Normal    Mobility walk with assistance use a cane use a walker ability to climb steps?  yes do you drive?  no use a wheelchair transfers alone Do you have any goals in this area?  yes  Function retired Do you have any goals in this area?  yes  Neuro/Psych No problems in this area  Prior Studies Any changes since last visit?  no  Physicians involved in your care Any changes since last visit?  no   Family History  Problem Relation Age of Onset   Sudden death Neg Hx    Social History   Socioeconomic History   Marital status: Married    Spouse name: Not on file   Number of children: Not on file   Years of education: Not on file   Highest education level: Bachelor's degree (e.g., BA, AB, BS)  Occupational History   Not on file  Tobacco Use   Smoking status: Never   Smokeless tobacco: Never  Vaping Use   Vaping Use: Never used  Substance and Sexual Activity   Alcohol use: Never   Drug use: Never   Sexual activity: Not Currently    Birth control/protection: Post-menopausal  Other Topics Concern   Not on file  Social History Narrative   Not on file   Social Determinants of Health   Financial Resource Strain: Low Risk  (02/27/2023)   Overall Financial Resource Strain (CARDIA)    Difficulty of Paying Living Expenses: Not hard at all  Food Insecurity: No Food Insecurity (02/27/2023)   Hunger Vital Sign    Worried About Running Out of Food in the Last  Year: Never true    Ran Out of Food in the Last Year: Never true  Transportation Needs: No Transportation Needs (02/27/2023)   PRAPARE - Administrator, Civil Service (Medical): No    Lack of Transportation (Non-Medical): No  Physical Activity: Unknown (02/27/2023)   Exercise Vital Sign    Days of Exercise per Week: 0 days    Minutes of Exercise per Session: Not on file  Stress: No Stress Concern Present (02/27/2023)   Loretta Gates    Feeling of Stress : Not at all  Social Connections: Socially Integrated (02/27/2023)   Social Connection and Isolation Panel [NHANES]    Frequency of Communication with Friends and Family: Twice a week    Frequency of Social Gatherings with Friends and Family: More than three times a week    Attends Religious Services: More than 4 times per year    Active Member of [employer] or Organizations: Yes    Attends Banker Meetings: 1 to 4 times per year    Marital Status: Married   Past Surgical History:  Procedure Laterality Date   BREAST LUMPECTOMY WITH RADIOACTIVE SEED LOCALIZATION Right  10/25/2021   Procedure: RIGHT BREAST LUMPECTOMY WITH RADIOACTIVE SEED LOCALIZATION;  Surgeon: Harriette Bouillon, MD;  Location: [facility];  Service: General;  Laterality: Right;   CARDIOVERSION N/A 01/01/2023   Procedure: CARDIOVERSION;  Surgeon: [name], MD;  Location[facility];  Service: Cardiovascular;  Laterality: N/A;   CHOLECYSTECTOMY, LAPAROSCOPIC  02/25/2017   @[facility]    COLONOSCOPY WITH ESOPHAGOGASTRODUODENOSCOPY (EGD)  2022   IR ANGIO INTRA EXTRACRAN SEL COM CAROTID INNOMINATE UNI R MOD SED  02/07/2023   IR ANGIO VERTEBRAL SEL SUBCLAVIAN INNOMINATE UNI L MOD SED  02/07/2023   IR CT HEAD LTD  02/07/2023   IR INTRAVSC STENT CERV CAROTID W/O EMB-PROT MOD SED INC ANGIO  02/07/2023   IR PERCUTANEOUS ART THROMBECTOMY/INFUSION INTRACRANIAL INC DIAG ANGIO  02/07/2023    MASTECTOMY WITH AXILLARY LYMPH NODE DISSECTION Left 03/05/2007   @ [facility]   OVARIAN CYST SURGERY     age 60;   abdominal   RADIOLOGY WITH ANESTHESIA N/A 02/06/2023   Procedure: IR WITH ANESTHESIA;  Surgeon: Radiologist, Medication, MD;  Location: [facility] OR;  Service: Radiology;  Laterality: N/A;   THYROIDECTOMY, PARTIAL Left 2006   left lobectomy and right infertior parathyroidectomy   TONSILLECTOMY     child   Past Medical History:  Diagnosis Date   Anemia    Bladder cancer [facility])    urologist--- dr pace   Chronic diastolic (congestive) heart failure (HCC)    followed by dr Jens Som   Chronic venous insufficiency    Edema of both lower extremities    per pt wears compression hose   GERD (gastroesophageal reflux disease)    History of cancer chemotherapy    completed 2008 for left breast cancer   History of head and neck radiation 1946   per pt approx 1946 or 1947  (age 43) due to 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 patel;   2006  s/p left thyroidectomy w/ right inferior parathyoidectomy  (per path speciman marked parathyroid but only thyroid tissue)   Hypertension    IDA (iron deficiency anemia)    hematology/ oncologist--- dr ennever/ sarah carter NP;  treated w/ IV iron infusions   Multinodular thyroid    Neuropathy    mild hands/ feet, uses cane   Nocturia more than twice per night    Nocturnal leg cramps    OA (osteoarthritis)    knees   OAB (overactive bladder)    Osteoporosis    PAF (paroxysmal atrial fibrillation) [facility])    cardiologist--- dr Jens Som   PONV  (postoperative nausea and vomiting)    Vitamin D deficiency    Wears glasses    Ht 4\' 10"  (1.473 m)   BMI 37.95 kg/m   Opioid Risk Score:   Fall Risk Score:  `1  Depression screen [facility] 2/9     01/30/2023   10:23 AM 11/20/2022    2:33 PM 11/10/2022    9:05 AM 01/27/2022   10:57 AM 11/14/2021   11:14 AM  Depression screen PHQ 2/9  Decreased Interest 0 0 0 0 0  Down,  Depressed, Hopeless 0 0 0 0 0  PHQ - 2 Score 0 0 0 0 0    Review of Systems  All other systems reviewed and are negative.     Objective:   Physical Exam Vitals and nursing note reviewed.  Constitutional:      Appearance: Normal appearance.  Cardiovascular:     Rate and Rhythm: Normal rate and regular rhythm.     Pulses: Normal pulses.     Heart sounds: Normal heart sounds.  Pulmonary:     Effort: Pulmonary effort is normal.     Breath sounds: Normal breath sounds.  Musculoskeletal:     Cervical back: Normal range of motion and neck supple.     Right lower leg: Edema present.     Left lower leg: Edema present.     Comments: Normal Muscle Bulk and Muscle Testing Reveals:  Upper Extremities: Full ROM and Muscle Strength on Right 3/5 and Left 5/5  Lower Extremities Full ROM and Muscle Strength 5/5 Arrived in Wheelchair     Skin:    General: Skin is warm and dry.  Neurological:     Mental Status: She is alert and oriented to person, place, and time.  Psychiatric:        Mood and Affect: Mood normal.        Behavior: Behavior normal.         Assessment & Plan:   Left middle cerebral artery stroke: Continue Home Health Therapy with [facility]. She has a scheduled appointment with Neurology. Continue current medication regimen. Continue to Monitor.   Atrial Fibrillation: Continue Current Medication regimen. She has a scheduled appointment with Cardiology. Continue to Monitor.   F/U with Dr Carlis Abbott in 4- 6 weeks

## 2023-03-10 HISTORY — PX: IR RADIOLOGIST EVAL & MGMT: IMG5224

## 2023-03-12 DIAGNOSIS — M810 Age-related osteoporosis without current pathological fracture: Secondary | ICD-10-CM | POA: Diagnosis not present

## 2023-03-12 NOTE — Telephone Encounter (Signed)
Orders given to Clydie Braun.

## 2023-03-13 ENCOUNTER — Telehealth: Payer: Self-pay | Admitting: Family Medicine

## 2023-03-13 DIAGNOSIS — I69320 Aphasia following cerebral infarction: Secondary | ICD-10-CM | POA: Diagnosis not present

## 2023-03-13 DIAGNOSIS — I872 Venous insufficiency (chronic) (peripheral): Secondary | ICD-10-CM | POA: Diagnosis not present

## 2023-03-13 DIAGNOSIS — Z7901 Long term (current) use of anticoagulants: Secondary | ICD-10-CM | POA: Diagnosis not present

## 2023-03-13 DIAGNOSIS — M170 Bilateral primary osteoarthritis of knee: Secondary | ICD-10-CM | POA: Diagnosis not present

## 2023-03-13 DIAGNOSIS — E213 Hyperparathyroidism, unspecified: Secondary | ICD-10-CM | POA: Diagnosis not present

## 2023-03-13 DIAGNOSIS — K219 Gastro-esophageal reflux disease without esophagitis: Secondary | ICD-10-CM | POA: Diagnosis not present

## 2023-03-13 DIAGNOSIS — H5340 Unspecified visual field defects: Secondary | ICD-10-CM | POA: Diagnosis not present

## 2023-03-13 DIAGNOSIS — H2513 Age-related nuclear cataract, bilateral: Secondary | ICD-10-CM | POA: Diagnosis not present

## 2023-03-13 DIAGNOSIS — D509 Iron deficiency anemia, unspecified: Secondary | ICD-10-CM | POA: Diagnosis not present

## 2023-03-13 DIAGNOSIS — C679 Malignant neoplasm of bladder, unspecified: Secondary | ICD-10-CM | POA: Diagnosis not present

## 2023-03-13 DIAGNOSIS — M810 Age-related osteoporosis without current pathological fracture: Secondary | ICD-10-CM | POA: Diagnosis not present

## 2023-03-13 DIAGNOSIS — I69398 Other sequelae of cerebral infarction: Secondary | ICD-10-CM | POA: Diagnosis not present

## 2023-03-13 DIAGNOSIS — K5730 Diverticulosis of large intestine without perforation or abscess without bleeding: Secondary | ICD-10-CM | POA: Diagnosis not present

## 2023-03-13 DIAGNOSIS — C50812 Malignant neoplasm of overlapping sites of left female breast: Secondary | ICD-10-CM | POA: Diagnosis not present

## 2023-03-13 DIAGNOSIS — I480 Paroxysmal atrial fibrillation: Secondary | ICD-10-CM | POA: Diagnosis not present

## 2023-03-13 DIAGNOSIS — E042 Nontoxic multinodular goiter: Secondary | ICD-10-CM | POA: Diagnosis not present

## 2023-03-13 DIAGNOSIS — I69351 Hemiplegia and hemiparesis following cerebral infarction affecting right dominant side: Secondary | ICD-10-CM | POA: Diagnosis not present

## 2023-03-13 DIAGNOSIS — I110 Hypertensive heart disease with heart failure: Secondary | ICD-10-CM | POA: Diagnosis not present

## 2023-03-13 DIAGNOSIS — G629 Polyneuropathy, unspecified: Secondary | ICD-10-CM | POA: Diagnosis not present

## 2023-03-13 DIAGNOSIS — I5033 Acute on chronic diastolic (congestive) heart failure: Secondary | ICD-10-CM | POA: Diagnosis not present

## 2023-03-13 DIAGNOSIS — N319 Neuromuscular dysfunction of bladder, unspecified: Secondary | ICD-10-CM | POA: Diagnosis not present

## 2023-03-13 DIAGNOSIS — Z792 Long term (current) use of antibiotics: Secondary | ICD-10-CM | POA: Diagnosis not present

## 2023-03-13 DIAGNOSIS — R7303 Prediabetes: Secondary | ICD-10-CM | POA: Diagnosis not present

## 2023-03-13 NOTE — Telephone Encounter (Signed)
Message left with verbal orders.  

## 2023-03-13 NOTE — Telephone Encounter (Signed)
Caller/Agency: Alvan Dame Number: [phone] Requesting OT/PT/Skilled Nursing/Social Work/Speech Therapy: Speech Therapy Frequency: 1x7

## 2023-03-14 DIAGNOSIS — H2513 Age-related nuclear cataract, bilateral: Secondary | ICD-10-CM | POA: Diagnosis not present

## 2023-03-14 DIAGNOSIS — I69351 Hemiplegia and hemiparesis following cerebral infarction affecting right dominant side: Secondary | ICD-10-CM | POA: Diagnosis not present

## 2023-03-14 DIAGNOSIS — K5730 Diverticulosis of large intestine without perforation or abscess without bleeding: Secondary | ICD-10-CM | POA: Diagnosis not present

## 2023-03-14 DIAGNOSIS — Z7901 Long term (current) use of anticoagulants: Secondary | ICD-10-CM | POA: Diagnosis not present

## 2023-03-14 DIAGNOSIS — I480 Paroxysmal atrial fibrillation: Secondary | ICD-10-CM | POA: Diagnosis not present

## 2023-03-14 DIAGNOSIS — C50812 Malignant neoplasm of overlapping sites of left female breast: Secondary | ICD-10-CM | POA: Diagnosis not present

## 2023-03-14 DIAGNOSIS — I872 Venous insufficiency (chronic) (peripheral): Secondary | ICD-10-CM | POA: Diagnosis not present

## 2023-03-14 DIAGNOSIS — R7303 Prediabetes: Secondary | ICD-10-CM | POA: Diagnosis not present

## 2023-03-14 DIAGNOSIS — I69398 Other sequelae of cerebral infarction: Secondary | ICD-10-CM | POA: Diagnosis not present

## 2023-03-14 DIAGNOSIS — I110 Hypertensive heart disease with heart failure: Secondary | ICD-10-CM | POA: Diagnosis not present

## 2023-03-14 DIAGNOSIS — E042 Nontoxic multinodular goiter: Secondary | ICD-10-CM | POA: Diagnosis not present

## 2023-03-14 DIAGNOSIS — M170 Bilateral primary osteoarthritis of knee: Secondary | ICD-10-CM | POA: Diagnosis not present

## 2023-03-14 DIAGNOSIS — I69320 Aphasia following cerebral infarction: Secondary | ICD-10-CM | POA: Diagnosis not present

## 2023-03-14 DIAGNOSIS — K219 Gastro-esophageal reflux disease without esophagitis: Secondary | ICD-10-CM | POA: Diagnosis not present

## 2023-03-14 DIAGNOSIS — Z792 Long term (current) use of antibiotics: Secondary | ICD-10-CM | POA: Diagnosis not present

## 2023-03-14 DIAGNOSIS — C679 Malignant neoplasm of bladder, unspecified: Secondary | ICD-10-CM | POA: Diagnosis not present

## 2023-03-14 DIAGNOSIS — N319 Neuromuscular dysfunction of bladder, unspecified: Secondary | ICD-10-CM | POA: Diagnosis not present

## 2023-03-14 DIAGNOSIS — M810 Age-related osteoporosis without current pathological fracture: Secondary | ICD-10-CM | POA: Diagnosis not present

## 2023-03-14 DIAGNOSIS — D509 Iron deficiency anemia, unspecified: Secondary | ICD-10-CM | POA: Diagnosis not present

## 2023-03-14 DIAGNOSIS — E213 Hyperparathyroidism, unspecified: Secondary | ICD-10-CM | POA: Diagnosis not present

## 2023-03-14 DIAGNOSIS — I5033 Acute on chronic diastolic (congestive) heart failure: Secondary | ICD-10-CM | POA: Diagnosis not present

## 2023-03-14 DIAGNOSIS — H5340 Unspecified visual field defects: Secondary | ICD-10-CM | POA: Diagnosis not present

## 2023-03-14 DIAGNOSIS — G629 Polyneuropathy, unspecified: Secondary | ICD-10-CM | POA: Diagnosis not present

## 2023-03-15 DIAGNOSIS — K5730 Diverticulosis of large intestine without perforation or abscess without bleeding: Secondary | ICD-10-CM | POA: Diagnosis not present

## 2023-03-15 DIAGNOSIS — G629 Polyneuropathy, unspecified: Secondary | ICD-10-CM | POA: Diagnosis not present

## 2023-03-15 DIAGNOSIS — M170 Bilateral primary osteoarthritis of knee: Secondary | ICD-10-CM | POA: Diagnosis not present

## 2023-03-15 DIAGNOSIS — C50812 Malignant neoplasm of overlapping sites of left female breast: Secondary | ICD-10-CM | POA: Diagnosis not present

## 2023-03-15 DIAGNOSIS — I69398 Other sequelae of cerebral infarction: Secondary | ICD-10-CM | POA: Diagnosis not present

## 2023-03-15 DIAGNOSIS — Z7901 Long term (current) use of anticoagulants: Secondary | ICD-10-CM | POA: Diagnosis not present

## 2023-03-15 DIAGNOSIS — E213 Hyperparathyroidism, unspecified: Secondary | ICD-10-CM | POA: Diagnosis not present

## 2023-03-15 DIAGNOSIS — D509 Iron deficiency anemia, unspecified: Secondary | ICD-10-CM | POA: Diagnosis not present

## 2023-03-15 DIAGNOSIS — M810 Age-related osteoporosis without current pathological fracture: Secondary | ICD-10-CM | POA: Diagnosis not present

## 2023-03-15 DIAGNOSIS — I480 Paroxysmal atrial fibrillation: Secondary | ICD-10-CM | POA: Diagnosis not present

## 2023-03-15 DIAGNOSIS — N319 Neuromuscular dysfunction of bladder, unspecified: Secondary | ICD-10-CM | POA: Diagnosis not present

## 2023-03-15 DIAGNOSIS — I69351 Hemiplegia and hemiparesis following cerebral infarction affecting right dominant side: Secondary | ICD-10-CM | POA: Diagnosis not present

## 2023-03-15 DIAGNOSIS — I5033 Acute on chronic diastolic (congestive) heart failure: Secondary | ICD-10-CM | POA: Diagnosis not present

## 2023-03-15 DIAGNOSIS — C679 Malignant neoplasm of bladder, unspecified: Secondary | ICD-10-CM | POA: Diagnosis not present

## 2023-03-15 DIAGNOSIS — I872 Venous insufficiency (chronic) (peripheral): Secondary | ICD-10-CM | POA: Diagnosis not present

## 2023-03-15 DIAGNOSIS — Z792 Long term (current) use of antibiotics: Secondary | ICD-10-CM | POA: Diagnosis not present

## 2023-03-15 DIAGNOSIS — I110 Hypertensive heart disease with heart failure: Secondary | ICD-10-CM | POA: Diagnosis not present

## 2023-03-15 DIAGNOSIS — H5340 Unspecified visual field defects: Secondary | ICD-10-CM | POA: Diagnosis not present

## 2023-03-15 DIAGNOSIS — R7303 Prediabetes: Secondary | ICD-10-CM | POA: Diagnosis not present

## 2023-03-15 DIAGNOSIS — H2513 Age-related nuclear cataract, bilateral: Secondary | ICD-10-CM | POA: Diagnosis not present

## 2023-03-15 DIAGNOSIS — K219 Gastro-esophageal reflux disease without esophagitis: Secondary | ICD-10-CM | POA: Diagnosis not present

## 2023-03-15 DIAGNOSIS — E042 Nontoxic multinodular goiter: Secondary | ICD-10-CM | POA: Diagnosis not present

## 2023-03-15 DIAGNOSIS — I69320 Aphasia following cerebral infarction: Secondary | ICD-10-CM | POA: Diagnosis not present

## 2023-03-16 ENCOUNTER — Telehealth: Payer: Self-pay | Admitting: Family Medicine

## 2023-03-16 DIAGNOSIS — K219 Gastro-esophageal reflux disease without esophagitis: Secondary | ICD-10-CM | POA: Diagnosis not present

## 2023-03-16 DIAGNOSIS — R7303 Prediabetes: Secondary | ICD-10-CM | POA: Diagnosis not present

## 2023-03-16 DIAGNOSIS — M170 Bilateral primary osteoarthritis of knee: Secondary | ICD-10-CM | POA: Diagnosis not present

## 2023-03-16 DIAGNOSIS — I69398 Other sequelae of cerebral infarction: Secondary | ICD-10-CM | POA: Diagnosis not present

## 2023-03-16 DIAGNOSIS — Z7901 Long term (current) use of anticoagulants: Secondary | ICD-10-CM | POA: Diagnosis not present

## 2023-03-16 DIAGNOSIS — I872 Venous insufficiency (chronic) (peripheral): Secondary | ICD-10-CM | POA: Diagnosis not present

## 2023-03-16 DIAGNOSIS — K5730 Diverticulosis of large intestine without perforation or abscess without bleeding: Secondary | ICD-10-CM | POA: Diagnosis not present

## 2023-03-16 DIAGNOSIS — H5340 Unspecified visual field defects: Secondary | ICD-10-CM | POA: Diagnosis not present

## 2023-03-16 DIAGNOSIS — Z792 Long term (current) use of antibiotics: Secondary | ICD-10-CM | POA: Diagnosis not present

## 2023-03-16 DIAGNOSIS — N319 Neuromuscular dysfunction of bladder, unspecified: Secondary | ICD-10-CM | POA: Diagnosis not present

## 2023-03-16 DIAGNOSIS — I5033 Acute on chronic diastolic (congestive) heart failure: Secondary | ICD-10-CM | POA: Diagnosis not present

## 2023-03-16 DIAGNOSIS — I69320 Aphasia following cerebral infarction: Secondary | ICD-10-CM | POA: Diagnosis not present

## 2023-03-16 DIAGNOSIS — G629 Polyneuropathy, unspecified: Secondary | ICD-10-CM | POA: Diagnosis not present

## 2023-03-16 DIAGNOSIS — M810 Age-related osteoporosis without current pathological fracture: Secondary | ICD-10-CM | POA: Diagnosis not present

## 2023-03-16 DIAGNOSIS — C50812 Malignant neoplasm of overlapping sites of left female breast: Secondary | ICD-10-CM | POA: Diagnosis not present

## 2023-03-16 DIAGNOSIS — C679 Malignant neoplasm of bladder, unspecified: Secondary | ICD-10-CM | POA: Diagnosis not present

## 2023-03-16 DIAGNOSIS — I110 Hypertensive heart disease with heart failure: Secondary | ICD-10-CM | POA: Diagnosis not present

## 2023-03-16 DIAGNOSIS — D509 Iron deficiency anemia, unspecified: Secondary | ICD-10-CM | POA: Diagnosis not present

## 2023-03-16 DIAGNOSIS — I480 Paroxysmal atrial fibrillation: Secondary | ICD-10-CM | POA: Diagnosis not present

## 2023-03-16 DIAGNOSIS — E042 Nontoxic multinodular goiter: Secondary | ICD-10-CM | POA: Diagnosis not present

## 2023-03-16 DIAGNOSIS — I69351 Hemiplegia and hemiparesis following cerebral infarction affecting right dominant side: Secondary | ICD-10-CM | POA: Diagnosis not present

## 2023-03-16 DIAGNOSIS — E213 Hyperparathyroidism, unspecified: Secondary | ICD-10-CM | POA: Diagnosis not present

## 2023-03-16 DIAGNOSIS — H2513 Age-related nuclear cataract, bilateral: Secondary | ICD-10-CM | POA: Diagnosis not present

## 2023-03-16 NOTE — Telephone Encounter (Signed)
Sheralyn Boatman from [facility] has called to request verbal orders. Speech therapy  2 week 1 1 week 6  Toni: [phone]

## 2023-03-16 NOTE — Telephone Encounter (Signed)
Orders given.  

## 2023-03-16 NOTE — Telephone Encounter (Signed)
[phone] 

## 2023-03-20 DIAGNOSIS — K219 Gastro-esophageal reflux disease without esophagitis: Secondary | ICD-10-CM | POA: Diagnosis not present

## 2023-03-20 DIAGNOSIS — K5730 Diverticulosis of large intestine without perforation or abscess without bleeding: Secondary | ICD-10-CM | POA: Diagnosis not present

## 2023-03-20 DIAGNOSIS — E213 Hyperparathyroidism, unspecified: Secondary | ICD-10-CM | POA: Diagnosis not present

## 2023-03-20 DIAGNOSIS — G629 Polyneuropathy, unspecified: Secondary | ICD-10-CM | POA: Diagnosis not present

## 2023-03-20 DIAGNOSIS — I872 Venous insufficiency (chronic) (peripheral): Secondary | ICD-10-CM | POA: Diagnosis not present

## 2023-03-20 DIAGNOSIS — C679 Malignant neoplasm of bladder, unspecified: Secondary | ICD-10-CM | POA: Diagnosis not present

## 2023-03-20 DIAGNOSIS — C50812 Malignant neoplasm of overlapping sites of left female breast: Secondary | ICD-10-CM | POA: Diagnosis not present

## 2023-03-20 DIAGNOSIS — H5340 Unspecified visual field defects: Secondary | ICD-10-CM | POA: Diagnosis not present

## 2023-03-20 DIAGNOSIS — E042 Nontoxic multinodular goiter: Secondary | ICD-10-CM | POA: Diagnosis not present

## 2023-03-20 DIAGNOSIS — M810 Age-related osteoporosis without current pathological fracture: Secondary | ICD-10-CM | POA: Diagnosis not present

## 2023-03-20 DIAGNOSIS — I69398 Other sequelae of cerebral infarction: Secondary | ICD-10-CM | POA: Diagnosis not present

## 2023-03-20 DIAGNOSIS — I69351 Hemiplegia and hemiparesis following cerebral infarction affecting right dominant side: Secondary | ICD-10-CM | POA: Diagnosis not present

## 2023-03-20 DIAGNOSIS — I5033 Acute on chronic diastolic (congestive) heart failure: Secondary | ICD-10-CM | POA: Diagnosis not present

## 2023-03-20 DIAGNOSIS — Z792 Long term (current) use of antibiotics: Secondary | ICD-10-CM | POA: Diagnosis not present

## 2023-03-20 DIAGNOSIS — M170 Bilateral primary osteoarthritis of knee: Secondary | ICD-10-CM | POA: Diagnosis not present

## 2023-03-20 DIAGNOSIS — D509 Iron deficiency anemia, unspecified: Secondary | ICD-10-CM | POA: Diagnosis not present

## 2023-03-20 DIAGNOSIS — R7303 Prediabetes: Secondary | ICD-10-CM | POA: Diagnosis not present

## 2023-03-20 DIAGNOSIS — Z7901 Long term (current) use of anticoagulants: Secondary | ICD-10-CM | POA: Diagnosis not present

## 2023-03-20 DIAGNOSIS — I110 Hypertensive heart disease with heart failure: Secondary | ICD-10-CM | POA: Diagnosis not present

## 2023-03-20 DIAGNOSIS — H2513 Age-related nuclear cataract, bilateral: Secondary | ICD-10-CM | POA: Diagnosis not present

## 2023-03-20 DIAGNOSIS — I480 Paroxysmal atrial fibrillation: Secondary | ICD-10-CM | POA: Diagnosis not present

## 2023-03-20 DIAGNOSIS — N319 Neuromuscular dysfunction of bladder, unspecified: Secondary | ICD-10-CM | POA: Diagnosis not present

## 2023-03-20 DIAGNOSIS — I69320 Aphasia following cerebral infarction: Secondary | ICD-10-CM | POA: Diagnosis not present

## 2023-03-22 ENCOUNTER — Encounter: Payer: Self-pay | Admitting: Family Medicine

## 2023-03-22 ENCOUNTER — Other Ambulatory Visit: Payer: Self-pay | Admitting: Family Medicine

## 2023-03-22 MED ORDER — MAGNESIUM OXIDE -MG SUPPLEMENT 400 (240 MG) MG PO TABS: 400.0000 mg | ORAL_TABLET | Freq: Every day | ORAL | 3 refills | Status: DC

## 2023-03-22 NOTE — Telephone Encounter (Signed)
I called and spoke with pt and her daughter Loretta Gates. [place] has some lower abd pain, but not severe . Wonder if this might be diverticulitis.  They do not feel she needs the ER tonight- they are comfortable seeing [employer].  They will go to the ER if things should get worse howeve

## 2023-03-23 ENCOUNTER — Encounter: Payer: Self-pay | Admitting: Family Medicine

## 2023-03-23 ENCOUNTER — Ambulatory Visit (INDEPENDENT_AMBULATORY_CARE_PROVIDER_SITE_OTHER): Payer: Medicare HMO | Admitting: Family Medicine

## 2023-03-23 ENCOUNTER — Ambulatory Visit (HOSPITAL_BASED_OUTPATIENT_CLINIC_OR_DEPARTMENT_OTHER)
Admission: RE | Admit: 2023-03-23 | Discharge: 2023-03-23 | Disposition: A | Discharge status: Home / Self Care | Payer: Medicare HMO | Source: Ambulatory Visit | Attending: Family Medicine | Admitting: Family Medicine

## 2023-03-23 VITALS — BP 122/93 | HR 65 | Temp 98.0°F | Ht <= 58 in

## 2023-03-23 DIAGNOSIS — R109 Unspecified abdominal pain: Secondary | ICD-10-CM | POA: Diagnosis not present

## 2023-03-23 DIAGNOSIS — R110 Nausea: Secondary | ICD-10-CM

## 2023-03-23 DIAGNOSIS — R1030 Lower abdominal pain, unspecified: Secondary | ICD-10-CM | POA: Insufficient documentation

## 2023-03-23 LAB — COMPREHENSIVE METABOLIC PANEL
ALT: 30 U/L (ref 0–35)
AST: 23 U/L (ref 0–37)
Albumin: 4.1 g/dL (ref 3.5–5.2)
Alkaline Phosphatase: 66 U/L (ref 39–117)
BUN: 14 mg/dL (ref 6–23)
CO2: 22 mEq/L (ref 19–32)
Calcium: 8.5 mg/dL (ref 8.4–10.5)
Chloride: 107 mEq/L (ref 96–112)
Creatinine, Ser: 0.92 mg/dL (ref 0.40–1.20)
GFR: 57 mL/min — ABNORMAL LOW (ref >60.00)
Glucose, Bld: 106 mg/dL — ABNORMAL HIGH (ref 70–99)
Potassium: 4.2 mEq/L (ref 3.5–5.1)
Sodium: 140 mEq/L (ref 135–145)
Total Bilirubin: 1.4 mg/dL — ABNORMAL HIGH (ref 0.2–1.2)
Total Protein: 6.5 g/dL (ref 6.0–8.3)

## 2023-03-23 LAB — CBC WITH DIFFERENTIAL/PLATELET
Basophils Absolute: 0.1 10*3/uL (ref 0.0–0.1)
Basophils Relative: 0.6 % (ref 0.0–3.0)
Eosinophils Absolute: 0.1 10*3/uL (ref 0.0–0.7)
Eosinophils Relative: 1.3 % (ref 0.0–5.0)
HCT: 38.6 % (ref 36.0–46.0)
Hemoglobin: 12.5 g/dL (ref 12.0–15.0)
Lymphocytes Relative: 12.9 % (ref 12.0–46.0)
Lymphs Abs: 1.4 10*3/uL (ref 0.7–4.0)
MCHC: 32.3 g/dL (ref 30.0–36.0)
MCV: 85.7 fl (ref 78.0–100.0)
Monocytes Absolute: 0.8 10*3/uL (ref 0.1–1.0)
Monocytes Relative: 7.3 % (ref 3.0–12.0)
Neutro Abs: 8.5 10*3/uL — ABNORMAL HIGH (ref 1.4–7.7)
Neutrophils Relative %: 77.9 % — ABNORMAL HIGH (ref 43.0–77.0)
Platelets: 252 10*3/uL (ref 150.0–400.0)
RBC: 4.51 Mil/uL (ref 3.87–5.11)
RDW: 16.8 % — ABNORMAL HIGH (ref 11.5–15.5)
WBC: 10.9 10*3/uL — ABNORMAL HIGH (ref 4.0–10.5)

## 2023-03-23 LAB — POC URINALSYSI DIPSTICK (AUTOMATED)
Bilirubin, UA: NEGATIVE
Blood, UA: NEGATIVE
Glucose, UA: POSITIVE — AB
Ketones, UA: NEGATIVE
Leukocytes, UA: NEGATIVE
Nitrite, UA: NEGATIVE
Protein, UA: NEGATIVE
Spec Grav, UA: <=1.005 — AB (ref 1.010–1.025)
Urobilinogen, UA: 0.2 E.U./dL
pH, UA: 6 (ref 5.0–8.0)

## 2023-03-23 MED ORDER — ONDANSETRON HCL 4 MG PO TABS: 4.0000 mg | ORAL_TABLET | Freq: Three times a day (TID) | ORAL | 0 refills | Status: DC | PRN

## 2023-03-23 MED ORDER — AMOXICILLIN-POT CLAVULANATE 875-125 MG PO TABS
1.0000 | ORAL_TABLET | Freq: Two times a day (BID) | ORAL | 0 refills | Status: DC
Start: 2023-03-23 — End: 2023-04-04

## 2023-03-23 NOTE — Progress Notes (Signed)
Acute Office Visit  Subjective:     Patient ID: Loretta Gates, female    DOB: 11-13-1935, 87 y.o.   MRN: [id number]  Chief Complaint  Patient presents with   Nausea    HPI Patient is in today for nausea, abd pain  Discussed the use of AI scribe software for clinical note transcription with the patient, who gave verbal consent to proceed.  History of Present Illness   The patient, with a history of diverticulosis and recent hospitalization for a stroke, presents with a week-long history of abdominal pain and nausea. The abdominal pain is described as crampy, located in the lower abdomen, and intermittent, lasting for hours at a time. The pain is rated as a 5 out of 10 in severity. The nausea also comes and goes, but has been particularly severe for the past two days, causing the patient to be bedridden. The patient has not taken any medication for the nausea and has not vomited. The patient also reports constipation, with the last bowel movement occurring four days ago and the stool described as small and pebble-like. The patient denies any recent diet or medication changes, with the exception of a course of amoxicillin for a urinary tract infection two weeks ago.          ROS All review of systems negative except what is listed in the HPI      Objective:    BP (!) 122/93   Pulse 65   Temp 98 F (36.7 C) (Oral)   Ht 4\' 10"  (1.473 m)   SpO2 98%   BMI 37.95 kg/m    Physical Exam Vitals reviewed.  Constitutional:      Appearance: Normal appearance.  Pulmonary:     Effort: Pulmonary effort is normal.  Abdominal:     General: Abdomen is flat. Bowel sounds are normal. There is no distension.     Palpations: Abdomen is soft.     Tenderness: There is no abdominal tenderness. There is no guarding or rebound.  Neurological:     Mental Status: She is alert.     Results for orders placed or performed in visit on 03/23/23  CBC with Differential/Platelet  Result Value  Ref Range   WBC 10.9 (H) 4.0 - 10.5 K/uL   RBC 4.51 3.87 - 5.11 Mil/uL   Hemoglobin 12.5 12.0 - 15.0 g/dL   HCT 21.3 08.6 - 57.8 %   MCV 85.7 78.0 - 100.0 fl   MCHC 32.3 30.0 - 36.0 g/dL   RDW 46.9 (H) 62.9 - 52.8 %   Platelets 252.0 150.0 - 400.0 K/uL   Neutrophils Relative % 77.9 (H) 43.0 - 77.0 %   Lymphocytes Relative 12.9 12.0 - 46.0 %   Monocytes Relative 7.3 3.0 - 12.0 %   Eosinophils Relative 1.3 0.0 - 5.0 %   Basophils Relative 0.6 0.0 - 3.0 %   Neutro Abs 8.5 (H) 1.4 - 7.7 K/uL   Lymphs Abs 1.4 0.7 - 4.0 K/uL   Monocytes Absolute 0.8 0.1 - 1.0 K/uL   Eosinophils Absolute 0.1 0.0 - 0.7 K/uL   Basophils Absolute 0.1 0.0 - 0.1 K/uL  Comprehensive metabolic panel  Result Value Ref Range   Sodium 140 135 - 145 mEq/L   Potassium 4.2 3.5 - 5.1 mEq/L   Chloride 107 96 - 112 mEq/L   CO2 22 19 - 32 mEq/L   Glucose, Bld 106 (H) 70 - 99 mg/dL   BUN 14 6 - 23 mg/dL  Creatinine, Ser 0.92 0.40 - 1.20 mg/dL   Total Bilirubin 1.4 (H) 0.2 - 1.2 mg/dL   Alkaline Phosphatase 66 39 - 117 U/L   AST 23 0 - 37 U/L   ALT 30 0 - 35 U/L   Total Protein 6.5 6.0 - 8.3 g/dL   Albumin 4.1 3.5 - 5.2 g/dL   GFR 16.10 (L) >96.04 mL/min   Calcium 8.5 8.4 - 10.5 mg/dL  POCT Urinalysis Dipstick (Automated)  Result Value Ref Range   Color, UA yellow    Clarity, UA clear    Glucose, UA Positive (A) Negative   Bilirubin, UA negative    Ketones, UA negative    Spec Grav, UA <=1.005 (A) 1.010 - 1.025   Blood, UA negative    pH, UA 6.0 5.0 - 8.0   Protein, UA Negative Negative   Urobilinogen, UA 0.2 0.2 or 1.0 E.U./dL   Nitrite, UA negative    Leukocytes, UA Negative Negative        Assessment & Plan:   Problem List Items Addressed This Visit   None Visit Diagnoses     Lower abdominal pain    -  Primary   Relevant Medications   amoxicillin-clavulanate (AUGMENTIN) 875-125 MG tablet   Other Relevant Orders   CBC with Differential/Platelet (Completed)   Comprehensive metabolic panel  (Completed)   DG Abd 1 View (Completed)   POCT Urinalysis Dipstick (Automated) (Completed)   Nausea       Relevant Medications   ondansetron (ZOFRAN) 4 MG tablet   Other Relevant Orders   POCT Urinalysis Dipstick (Automated) (Completed)      Assessment and Plan    Abdominal Pain and Nausea: Intermittent lower abdominal pain and nausea for the past week. Pain is crampy in nature, lasting a few hours at a time, and rated as 5/10 in severity. No recent diet or medication changes. No fever or vomiting. -Order abdominal X-ray and blood work to rule out infection and assess for constipation. -Prescribe Zofran for nausea.  Constipation: No bowel movement for at least four days, with small, pebble-like stools. -Start MiraLax twice a day for three days until soft bowel movement is achieved. -Advise to start a fiber supplement like Benefiber or Metamucil. -Encourage increased hydration and activity.  Hypertension: Blood pressure slightly elevated during visit, possibly due to discomfort. -Advise to monitor blood pressure at home and aim for diastolic reading in the 80s or below.  Fluid Balance: Concerns about dehydration due to low fluid intake. -Advise to start drinking Pedialyte, alternating with water. -Advise to monitor weight daily to assess fluid balance.  Lasix Management: Currently taking Lasix every other day as per previous consultation with Dr. Dallas Schimke. -Continue current regimen until follow-up with cardiology on April 04, 2023.        Meds ordered this encounter  Medications   ondansetron (ZOFRAN) 4 MG tablet    Sig: Take 1 tablet (4 mg total) by mouth every 8 (eight) hours as needed for nausea or vomiting.    Dispense:  20 tablet    Refill:  0    Order Specific Question:   Supervising Provider    Answer:   Danise Edge A [[id number]]   amoxicillin-clavulanate (AUGMENTIN) 875-125 MG tablet    Sig: Take 1 tablet by mouth 2 (two) times daily.    Dispense:  20 tablet     Refill:  0    Order Specific Question:   Supervising Provider    Answer:   Danise Edge  A [[id number]]    Return if symptoms worsen or fail to improve.  Clayborne Dana, NP

## 2023-03-23 NOTE — Patient Instructions (Signed)
Labs today to ensure no signs of infection Xray to check for constipation - review the measures below Please contact office for follow-up if symptoms do not improve or worsen. Seek emergency care if symptoms become severe.    CONSTIPATION:  ALL THE TIME:  Lifestyle measures Hydration: drink plenty of water Physical activity: at least walking daily High-fiber foods Bulk forming laxatives  Psyllium (Konsyl; Metamucil; Perdiem) Methylcellulose (Citrucel) Calcium polycarbophil (FiberCon; Fiber-Lax; Mitrolan) Wheat dextrin (Benefiber)  AS NEEDED FOR 3-5 DAYS AT A TIME OR ALL THE TIME 1-2 TIMES PER WEEK  (CAN TAKE ONE FROM EACH CATEGORY E.G. MIRALAX + SENNA) Hyperosmolar or saline laxatives: Polyethylene glycol (MiraLAX, GlycoLax) Lactulose Sorbitol Magnesium hydroxide (Milk of Magnesia)  Magnesium citrate (Evac-Q-Mag)  Stimulant laxatives Senna (eg, Black Draught, Ex-Lax, Fletcher's, Castoria, Senokot)  Bisacodyl (eg, Correctol, Doxidan, Dulcolax). Taking stimulant laxatives regularly or in large amounts can cause side effects, including low potassium levels. Thus, you should take these drugs carefully if you must use them regularly.  PRESCRIPTIONS that treat severe constipation. They are expensive, but may be recommended if you do not respond to other treatments. Lubiprostone (Amitiza) Linaclotide (Linzess) Plecanatide (Trulance, Motegrity) Tenapanor Allena Napoleon)

## 2023-03-25 ENCOUNTER — Encounter: Payer: Self-pay | Admitting: Family Medicine

## 2023-03-26 ENCOUNTER — Emergency Department (HOSPITAL_BASED_OUTPATIENT_CLINIC_OR_DEPARTMENT_OTHER)
Admission: EM | Admit: 2023-03-26 | Discharge: 2023-03-26 | Disposition: A | Discharge status: Home / Self Care | Payer: Medicare HMO | Attending: Emergency Medicine | Admitting: Emergency Medicine

## 2023-03-26 ENCOUNTER — Emergency Department (HOSPITAL_BASED_OUTPATIENT_CLINIC_OR_DEPARTMENT_OTHER): Payer: Medicare HMO

## 2023-03-26 ENCOUNTER — Other Ambulatory Visit: Payer: Self-pay

## 2023-03-26 ENCOUNTER — Encounter (HOSPITAL_BASED_OUTPATIENT_CLINIC_OR_DEPARTMENT_OTHER): Payer: Self-pay

## 2023-03-26 DIAGNOSIS — I10 Essential (primary) hypertension: Secondary | ICD-10-CM | POA: Diagnosis not present

## 2023-03-26 DIAGNOSIS — Z7901 Long term (current) use of anticoagulants: Secondary | ICD-10-CM | POA: Diagnosis not present

## 2023-03-26 DIAGNOSIS — R1032 Left lower quadrant pain: Secondary | ICD-10-CM | POA: Diagnosis not present

## 2023-03-26 DIAGNOSIS — K5730 Diverticulosis of large intestine without perforation or abscess without bleeding: Secondary | ICD-10-CM | POA: Insufficient documentation

## 2023-03-26 DIAGNOSIS — R109 Unspecified abdominal pain: Secondary | ICD-10-CM

## 2023-03-26 DIAGNOSIS — K760 Fatty (change of) liver, not elsewhere classified: Secondary | ICD-10-CM | POA: Diagnosis not present

## 2023-03-26 DIAGNOSIS — N281 Cyst of kidney, acquired: Secondary | ICD-10-CM | POA: Diagnosis not present

## 2023-03-26 DIAGNOSIS — Z91010 Allergy to peanuts: Secondary | ICD-10-CM | POA: Insufficient documentation

## 2023-03-26 LAB — CBC WITH DIFFERENTIAL/PLATELET
Abs Immature Granulocytes: 0.05 10*3/uL (ref 0.00–0.07)
Basophils Absolute: 0.1 10*3/uL (ref 0.0–0.1)
Basophils Relative: 1 %
Eosinophils Absolute: 0.3 10*3/uL (ref 0.0–0.5)
Eosinophils Relative: 3 %
HCT: 37.2 % (ref 36.0–46.0)
Hemoglobin: 11.9 g/dL — ABNORMAL LOW (ref 12.0–15.0)
Immature Granulocytes: 0 %
Lymphocytes Relative: 14 %
Lymphs Abs: 1.6 10*3/uL (ref 0.7–4.0)
MCH: 27.4 pg (ref 26.0–34.0)
MCHC: 32 g/dL (ref 30.0–36.0)
MCV: 85.5 fL (ref 80.0–100.0)
Monocytes Absolute: 0.9 10*3/uL (ref 0.1–1.0)
Monocytes Relative: 8 %
Neutro Abs: 8.2 10*3/uL — ABNORMAL HIGH (ref 1.7–7.7)
Neutrophils Relative %: 74 %
Platelets: 273 10*3/uL (ref 150–400)
RBC: 4.35 MIL/uL (ref 3.87–5.11)
RDW: 15.9 % — ABNORMAL HIGH (ref 11.5–15.5)
WBC: 11.1 10*3/uL — ABNORMAL HIGH (ref 4.0–10.5)
nRBC: 0 % (ref 0.0–0.2)

## 2023-03-26 LAB — COMPREHENSIVE METABOLIC PANEL
ALT: 40 U/L (ref 0–44)
AST: 34 U/L (ref 15–41)
Albumin: 3.6 g/dL (ref 3.5–5.0)
Alkaline Phosphatase: 65 U/L (ref 38–126)
Anion gap: 9 (ref 5–15)
BUN: 16 mg/dL (ref 8–23)
CO2: 21 mmol/L — ABNORMAL LOW (ref 22–32)
Calcium: 7.7 mg/dL — ABNORMAL LOW (ref 8.9–10.3)
Chloride: 106 mmol/L (ref 98–111)
Creatinine, Ser: 0.96 mg/dL (ref 0.44–1.00)
GFR, Estimated: 58 mL/min — ABNORMAL LOW (ref >60)
Glucose, Bld: 112 mg/dL — ABNORMAL HIGH (ref 70–99)
Potassium: 3.8 mmol/L (ref 3.5–5.1)
Sodium: 136 mmol/L (ref 135–145)
Total Bilirubin: 1.2 mg/dL (ref 0.3–1.2)
Total Protein: 6.4 g/dL — ABNORMAL LOW (ref 6.5–8.1)

## 2023-03-26 LAB — URINALYSIS, MICROSCOPIC (REFLEX)

## 2023-03-26 LAB — URINALYSIS, ROUTINE W REFLEX MICROSCOPIC
Bilirubin Urine: NEGATIVE
Glucose, UA: >=500 mg/dL — AB
Ketones, ur: NEGATIVE mg/dL
Nitrite: NEGATIVE
Protein, ur: NEGATIVE mg/dL
Specific Gravity, Urine: 1.01 (ref 1.005–1.030)
pH: 5.5 (ref 5.0–8.0)

## 2023-03-26 LAB — LIPASE, BLOOD: Lipase: 33 U/L (ref 11–51)

## 2023-03-26 MED ORDER — METOPROLOL TARTRATE 25 MG PO TABS
75.0000 mg | ORAL_TABLET | Freq: Once | ORAL | Status: AC
  Administered 2023-03-26: 75 mg via ORAL
  Filled 2023-03-26: qty 3

## 2023-03-26 MED ORDER — DILTIAZEM HCL 25 MG/5ML IV SOLN
15.0000 mg | Freq: Once | INTRAVENOUS | Status: AC
  Administered 2023-03-26: 15 mg via INTRAVENOUS
  Filled 2023-03-26: qty 5

## 2023-03-26 MED ORDER — IOHEXOL 300 MG/ML  SOLN
100.0000 mL | Freq: Once | INTRAMUSCULAR | Status: AC | PRN
  Administered 2023-03-26: 100 mL via INTRAVENOUS

## 2023-03-26 MED ORDER — ONDANSETRON 4 MG PO TBDP
4.0000 mg | ORAL_TABLET | Freq: Once | ORAL | Status: AC
  Administered 2023-03-26: 4 mg via ORAL
  Filled 2023-03-26: qty 1

## 2023-03-26 NOTE — ED Notes (Signed)
Patient transported to CT 

## 2023-03-26 NOTE — ED Provider Notes (Signed)
[place] [facility] AT [facility] Provider Note   CSN: [id number] Arrival date & time: 03/26/23  1545     History  Chief Complaint  Patient presents with   Abdominal Pain    Loretta Gates is a 87 y.o. female.  Patient here with abdominal pain.  Currently been on Augmentin for few days for what is presumed to be diverticulitis.  Nausea and vomiting has improved.  Loose stools improved.  Pains improved.  She has a history of A-fib on Eliquis.  Denies any chest pain or shortness of breath or weakness or numbness or tingling.  Symptoms have been ongoing for about 5 to 6 days.  Just tarted antibiotics about 3 days ago.  The history is provided by the patient.       Home Medications Prior to Admission medications   Medication Sig Start Date End Date Taking? Authorizing Provider  acetaminophen (TYLENOL) 325 MG tablet Take 2 tablets (650 mg total) by mouth every 4 (four) hours as needed for mild pain (or temp > 37.5 C (99.5 F)). 02/22/23   Angiulli, Mcarthur Rossetti, PA-C  amoxicillin-clavulanate (AUGMENTIN) 875-125 MG tablet Take 1 tablet by mouth 2 (two) times daily. 03/23/23   Clayborne Dana, NP  apixaban (ELIQUIS) 5 MG TABS tablet Take 1 tablet (5 mg total) by mouth 2 (two) times daily. 02/23/23   Setzer, Lynnell Jude, PA-C  cholestyramine Lanetta Inch) 4 g packet Take 1 packet (4 g total) by mouth daily at 6 PM. 02/23/23   Setzer, Lynnell Jude, PA-C  cinacalcet (SENSIPAR) 30 MG tablet Take 1 tablet (30 mg total) by mouth daily with breakfast. 02/24/23   Setzer, Lynnell Jude, PA-C  empagliflozin (JARDIANCE) 10 MG TABS tablet TAKE ONE TABLET BY MOUTH ONE TIME DAILY Patient taking differently: Take 10 mg by mouth daily. 12/14/22   Copland, Gwenlyn Found, MD  ergocalciferol (VITAMIN D2) 1.25 MG (50000 UT) capsule Take 50,000 Units by mouth every 14 (fourteen) days. Every other saturday    [provider]  furosemide (LASIX) 20 MG tablet Alternate taking 20 mg po daily with taking 30 mg  po daily ever other day 03/06/23   Copland, Gwenlyn Found, MD  magnesium oxide (MAG-OX) 400 (240 Mg) MG tablet Take 1 tablet (400 mg total) by mouth daily. 03/22/23   Copland, Gwenlyn Found, MD  Metoprolol Tartrate (LOPRESSOR) 50 MG tablet Take 1.5 tablets (75 mg total) by mouth 2 (two) times daily. 02/23/23   Setzer, Lynnell Jude, PA-C  omeprazole (PRILOSEC) 40 MG capsule Take 1 capsule (40 mg total) by mouth 2 (two) times daily before lunch and supper. Patient taking differently: Take 40 mg by mouth in the morning and at bedtime. 12/25/22   Copland, Gwenlyn Found, MD  ondansetron (ZOFRAN) 4 MG tablet Take 1 tablet (4 mg total) by mouth every 8 (eight) hours as needed for nausea or vomiting. 03/23/23   Clayborne Dana, NP  oxybutynin (DITROPAN XL) 15 MG 24 hr tablet Take 1 tablet (15 mg total) by mouth at bedtime. Patient taking differently: Take 15 mg by mouth at bedtime. 05/05/22   Copland, Gwenlyn Found, MD  ticagrelor (BRILINTA) 90 MG TABS tablet Take 0.5 tablets (45 mg total) by mouth 2 (two) times daily. 02/12/23   Kennieth Francois, PA      Allergies    Bee venom, Levaquin [levofloxacin], and Peanut-containing drug products    Review of Systems   Review of Systems  Physical Exam Updated Vital Signs  ED  Triage Vitals  Enc Vitals Group     BP 03/26/23 1617 (!) 128/96     Pulse Rate 03/26/23 1617 60     Resp 03/26/23 1617 18     Temp 03/26/23 1617 98 F (36.7 C)     Temp src --      SpO2 03/26/23 1617 95 %     Weight 03/26/23 1616 181 lb (82.1 kg)     Height 03/26/23 1616 4\' 10"  (1.473 m)     Head Circumference --      Peak Flow --      Pain Score 03/26/23 1616 4     Pain Loc --      Pain Edu? --      Excl. in GC? --       Physical Exam Vitals and nursing note reviewed.  Constitutional:      General: She is not in acute distress.    Appearance: She is well-developed. She is not ill-appearing.  HENT:     Head: Normocephalic and atraumatic.     Mouth/Throat:     Mouth: Mucous membranes are  moist.  Eyes:     Extraocular Movements: Extraocular movements intact.     Conjunctiva/sclera: Conjunctivae normal.     Pupils: Pupils are equal, round, and reactive to light.  Cardiovascular:     Rate and Rhythm: Normal rate and regular rhythm.     Pulses: Normal pulses.     Heart sounds: Normal heart sounds. No murmur heard. Pulmonary:     Effort: Pulmonary effort is normal. No respiratory distress.     Breath sounds: Normal breath sounds.  Abdominal:     Palpations: Abdomen is soft.     Tenderness: There is abdominal tenderness in the suprapubic area and left lower quadrant.  Musculoskeletal:     Cervical back: Neck supple.  Skin:    General: Skin is warm and dry.     Capillary Refill: Capillary refill takes less than 2 seconds.  Neurological:     General: No focal deficit present.     Mental Status: She is alert.     ED Results / Procedures / Treatments   Labs (all labs ordered are listed, but only abnormal results are displayed) Labs Reviewed  CBC WITH DIFFERENTIAL/PLATELET - Abnormal; Notable for the following components:      Result Value   WBC 11.1 (*)    Hemoglobin 11.9 (*)    RDW 15.9 (*)    Neutro Abs 8.2 (*)    All other components within normal limits  COMPREHENSIVE METABOLIC PANEL - Abnormal; Notable for the following components:   CO2 21 (*)    Glucose, Bld 112 (*)    Calcium 7.7 (*)    Total Protein 6.4 (*)    GFR, Estimated 58 (*)    All other components within normal limits  URINALYSIS, ROUTINE W REFLEX MICROSCOPIC - Abnormal; Notable for the following components:   Glucose, UA >=500 (*)    Hgb urine dipstick TRACE (*)    Leukocytes,Ua SMALL (*)    All other components within normal limits  URINALYSIS, MICROSCOPIC (REFLEX) - Abnormal; Notable for the following components:   Bacteria, UA FEW (*)    All other components within normal limits  LIPASE, BLOOD    EKG EKG Interpretation  Date/Time:  Monday March 26 2023 17:54:42 EDT Ventricular  Rate:  135 PR Interval:    QRS Duration: 86 QT Interval:  368 QTC Calculation: 552 R Axis:  67 Text Interpretation: Atrial fibrillation Confirmed by Virgina Norfolk [phone]) on 03/26/2023 6:11:26 PM  Radiology CT ABDOMEN PELVIS W CONTRAST  Result Date: 03/26/2023 CLINICAL DATA:  Acute abdominal pain. EXAM: CT ABDOMEN AND PELVIS WITH CONTRAST TECHNIQUE: Multidetector CT imaging of the abdomen and pelvis was performed using the standard protocol following bolus administration of intravenous contrast. RADIATION DOSE REDUCTION: This exam was performed according to the departmental dose-optimization program which includes automated exposure control, adjustment of the mA and/or kV according to patient size and/or use of iterative reconstruction technique. CONTRAST:  OMNIPAQUE IOHEXOL 300 MG/ML  SOLN COMPARISON:  CT abdomen and pelvis 01/22/2023. FINDINGS: Lower chest: There is a 6 mm hyperdense lesion in the right lobe of the liver image 2/17 which is unchanged from the prior study. There is fatty infiltration of the liver. Gallbladder surgically absent. No biliary ductal dilatation. Hepatobiliary: No focal liver abnormality is seen. No gallstones, gallbladder wall thickening, or biliary dilatation. Pancreas: Unremarkable. No pancreatic ductal dilatation or surrounding inflammatory changes. Spleen: There are scattered rounded hyperdense areas in the spleen measuring up to 12 mm, unchanged. Spleen is normal in size Adrenals/Urinary Tract: Subcentimeter cortical hypodensities in the left kidney likely represent cysts. Otherwise, kidneys and adrenal glands are within normal limits. Bladder is within normal limits Stomach/Bowel: Stomach is within normal limits. Appendix appears normal. No evidence of bowel wall thickening, distention, or inflammatory changes. There is sigmoid colon diverticulosis. Vascular/Lymphatic: Aortic atherosclerosis. No enlarged abdominal or pelvic lymph nodes. Reproductive: Uterus and  bilateral adnexa are unremarkable. Other: There is a small amount of free fluid in the pelvis and right lower quadrant. There is a small fat containing umbilical hernia. Musculoskeletal: Degenerative changes affect the spine. T10 vertebral body hemangioma is present. IMPRESSION: 1. Small amount of free fluid in the pelvis and right lower quadrant of uncertain etiology. 2. Sigmoid colon diverticulosis. 3. Fatty infiltration of the liver. 4. Stable hyperdense lesions in the liver and spleen, indeterminate, possibly flash fill hemangiomas. These can be further evaluated with MRI. 5. Subcentimeter left Bosniak I benign renal cyst. No follow-up imaging is recommended. JACR 2018 Feb; 264-273, Management of the Incidental Renal Mass on CT, [employer] 2021; 814-848, Bosniak Classification of Cystic Renal Masses, Version 2019. Aortic Atherosclerosis (ICD10-I70.0). Electronically Signed   By: Darliss Cheney M.D.   On: 03/26/2023 19:17    Procedures Procedures    Medications Ordered in ED Medications  ondansetron (ZOFRAN-ODT) disintegrating tablet 4 mg (4 mg Oral Given 03/26/23 1921)  diltiazem (CARDIZEM) injection 15 mg (15 mg Intravenous Given 03/26/23 1817)  metoprolol tartrate (LOPRESSOR) tablet 75 mg (75 mg Oral Given 03/26/23 1915)  iohexol (OMNIPAQUE) 300 MG/ML solution 100 mL (100 mLs Intravenous Contrast Given 03/26/23 1839)    ED Course/ Medical Decision Making/ A&P                             Medical Decision Making Amount and/or Complexity of Data Reviewed Labs: ordered. Radiology: ordered.  Risk Prescription drug management.   NEIDA VANNESS is here with abdominal pain.  Normal vitals except for heart rate is in the 100-1 20 range.  She has A-fib.  Is in A-fib right now, has not taken her nighttime Lopressor yet.  She has been good about taking her meds otherwise.  She denies any chest pain or shortness of breath.  She is tender in the lower abdomen.  Differential diagnosis likely  colitis/diverticulitis versus less likely bowel obstruction.  Could be viral process.  Will get CBC, CMP, urinalysis, CT abdomen pelvis.  Will give a dose IV diltiazem and give her home dose of Lopressor and reevaluate.  Heart rates greatly improved.  Now heart rate in the 80s.  Lab works unremarkable per my review and interpretation of labs.  Urinalysis unremarkable.  No concern for infection.  CT scan with no acute findings.  Some nonspecific free fluid.  Overall I suspect resolving colitis.  She has some incidental findings in her liver and spleen and renal areas.  She is made aware of this and follow-up with her primary care doctor regarding this.  Discharged in good condition.  This chart was dictated using voice recognition software.  Despite best efforts to proofread,  errors can occur which can change the documentation meaning.         Final Clinical Impression(s) / ED Diagnoses Final diagnoses:  Abdominal pain, unspecified abdominal location    Rx / DC Orders ED Discharge Orders     None         Virgina Norfolk, DO 03/26/23 1933

## 2023-03-26 NOTE — Telephone Encounter (Signed)
Pt was prescribed seen on 03/23/23 for nausea and abd pain and was given:    ondansetron (ZOFRAN) 4 MG tablet      Sig: Take 1 tablet (4 mg total) by mouth every 8 (eight) hours as needed for nausea or vomiting.      Dispense:  20 tablet      Refill:  0      Order Specific Question:   Supervising Provider      Answer:   Danise Edge A [[id number]]   amoxicillin-clavulanate (AUGMENTIN) 875-125 MG tablet      Sig: Take 1 tablet by mouth 2 (two) times daily.      Dispense:  20 tablet      Refill:  0      Order Specific Question:   Supervising Provider      Answer:   Danise Edge A [[id number]]   Please advise:

## 2023-03-26 NOTE — ED Notes (Signed)
Pt. Is alert and oriented to place and self and time.  Pt. In no distress.  Pt. Has some R sided visual neglect from a past stroke.  Pt. Has no swallowing issues.  HOB is elevated.

## 2023-03-26 NOTE — Progress Notes (Signed)
HPI: FU atrial fibrillation and congestive heart failure.  Patient previously followed in [facility].  Echocardiogram January 2024 showed normal LV function, mild right ventricular enlargement, moderate pulmonary hypertension, moderate left atrial enlargement, mild right atrial enlargement, mild mitral regurgitation, mild to moderate tricuspid regurgitation.  Patient was admitted January 2024 with CHF.  Patient was in atrial fibrillation at that time and placed on Cardizem and metoprolol increased.  Had elective cardioversion January 01, 2023.  Patient did pause her Eliquis for bladder surgery on April 29.  Patient developed difficulties with amaurosis fugax and seen February 06, 2023.  CTA of the head and neck showed occlusion of the left internal carotid artery just distal to its takeoff felt to be acute to subacute.  Patient had angiogram and revascularization by interventional radiology including carotid stent.  Follow-up MRI showed large posterior left MCA infarct, small left cerebellum, left occipital lobe infarcts and subarachnoid hemorrhage in the left sylvian fissure and over the posterior left frontal lobe.  Since last seen she occasionally has some dyspnea and mild pedal edema.  She denies exertional chest pain or syncope.  Current Outpatient Medications  Medication Sig Dispense Refill   acetaminophen (TYLENOL) 325 MG tablet Take 2 tablets (650 mg total) by mouth every 4 (four) hours as needed for mild pain (or temp > 37.5 C (99.5 F)).     apixaban (ELIQUIS) 5 MG TABS tablet Take 1 tablet (5 mg total) by mouth 2 (two) times daily. 60 tablet    cephALEXin (KEFLEX) 500 MG capsule Take 1 capsule (500 mg total) by mouth 2 (two) times daily. 14 capsule 0   cholestyramine (QUESTRAN) 4 g packet Take 1 packet (4 g total) by mouth daily at 6 PM. 90 packet 3   cinacalcet (SENSIPAR) 30 MG tablet Take 1 tablet (30 mg total) by mouth daily with breakfast. 30 tablet 0   empagliflozin (JARDIANCE) 10 MG TABS  tablet TAKE ONE TABLET BY MOUTH ONE TIME DAILY (Patient taking differently: Take 10 mg by mouth daily.) 30 tablet 3   ergocalciferol (VITAMIN D2) 1.25 MG (50000 UT) capsule Take 50,000 Units by mouth every 14 (fourteen) days. Every other saturday     furosemide (LASIX) 20 MG tablet Alternate taking 20 mg po daily with taking 30 mg po daily ever other day 135 tablet 3   magnesium oxide (MAG-OX) 400 (240 Mg) MG tablet Take 1 tablet (400 mg total) by mouth daily. 90 tablet 3   Metoprolol Tartrate (LOPRESSOR) 50 MG tablet Take 1.5 tablets (75 mg total) by mouth 2 (two) times daily. 45 tablet 0   omeprazole (PRILOSEC) 40 MG capsule Take 1 capsule (40 mg total) by mouth 2 (two) times daily before lunch and supper. (Patient taking differently: Take 40 mg by mouth in the morning and at bedtime.) 180 capsule 1   ondansetron (ZOFRAN) 4 MG tablet Take 1 tablet (4 mg total) by mouth every 8 (eight) hours as needed for nausea or vomiting. 20 tablet 0   oxybutynin (DITROPAN XL) 15 MG 24 hr tablet Take 1 tablet (15 mg total) by mouth at bedtime. (Patient taking differently: Take 15 mg by mouth at bedtime.) 90 tablet 3   potassium chloride (KLOR-CON M) 10 MEQ tablet Take 1 tablet (10 mEq total) by mouth daily. Use when taking more than 20 mg of furosemide daily 30 tablet 1   ticagrelor (BRILINTA) 90 MG TABS tablet Take 0.5 tablets (45 mg total) by mouth 2 (two) times daily. 60  tablet 2   No current facility-administered medications for this visit.     Past Medical History:  Diagnosis Date   Anemia    Bladder cancer [facility])    urologist--- dr pace   Chronic diastolic (congestive) heart failure (HCC)    followed by dr Jens Som   Chronic venous insufficiency    Edema of both lower extremities    per pt wears compression hose   GERD (gastroesophageal reflux disease)    History of cancer chemotherapy    completed 2008 for left breast cancer   History of head and neck radiation 1946   per pt approx 1946 or 1947   (age 69) due to profound bilateral hearing loss told from scarlett fever/ measles,  once weekly for several weeks had  head/ neck radiation,  hearing was restored without neededing hearing aids   History of left breast cancer 2008   malignant neoplasm of overlapping sites of left breast, ER+;  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 patel;   2006  s/p left thyroidectomy w/ right inferior parathyoidectomy  (per path speciman marked parathyroid but only thyroid tissue)   Hypertension    IDA (iron deficiency anemia)    hematology/ oncologist--- dr ennever/ sarah carter NP;  treated w/ IV iron infusions   Multinodular thyroid    Neuropathy    mild hands/ feet, uses cane   Nocturia more than twice per night    Nocturnal leg cramps    OA (osteoarthritis)    knees   OAB (overactive bladder)    Osteoporosis    PAF (paroxysmal atrial fibrillation) [facility])    cardiologist--- dr Jens Som   PONV (postoperative nausea and vomiting)    Vitamin D deficiency    Wears glasses     Past Surgical History:  Procedure Laterality Date   BREAST LUMPECTOMY WITH RADIOACTIVE SEED LOCALIZATION Right 10/25/2021   Procedure: RIGHT BREAST LUMPECTOMY WITH RADIOACTIVE SEED LOCALIZATION;  Surgeon: Harriette Bouillon, MD;  Location: [facility];  Service: General;  Laterality: Right;   CARDIOVERSION N/A 01/01/2023   Procedure: CARDIOVERSION;  Surgeon: [name], MD;  Location: [facility] ENDOSCOPY;  Service: Cardiovascular;  Laterality: N/A;   CHOLECYSTECTOMY, LAPAROSCOPIC  02/25/2017   @[facility]    COLONOSCOPY WITH ESOPHAGOGASTRODUODENOSCOPY (EGD)  2022   IR ANGIO INTRA EXTRACRAN SEL COM CAROTID INNOMINATE UNI R MOD SED  02/07/2023   IR ANGIO VERTEBRAL SEL SUBCLAVIAN INNOMINATE UNI L MOD SED  02/07/2023   IR CT HEAD LTD  02/07/2023   IR INTRAVSC STENT CERV CAROTID W/O EMB-PROT MOD  SED INC ANGIO  02/07/2023   IR PERCUTANEOUS ART THROMBECTOMY/INFUSION INTRACRANIAL INC DIAG ANGIO  02/07/2023   IR RADIOLOGIST EVAL & MGMT  03/10/2023   MASTECTOMY WITH AXILLARY LYMPH NODE DISSECTION Left 03/05/2007   @ [facility]   OVARIAN CYST SURGERY     age 87;   abdominal   RADIOLOGY WITH ANESTHESIA N/A 02/06/2023   Procedure: IR WITH ANESTHESIA;  Surgeon: Radiologist, Medication, MD;  Location: [facility] OR;  Service: Radiology;  Laterality: N/A;   THYROIDECTOMY, PARTIAL Left 2006   left lobectomy and right infertior parathyroidectomy   TONSILLECTOMY     child    Social History   Socioeconomic History   Marital status: Married    Spouse name: Not on file   Number of children: Not on file   Years of  education: Not on file   Highest education level: Bachelor's degree (e.g., BA, AB, BS)  Occupational History   Not on file  Tobacco Use   Smoking status: Never   Smokeless tobacco: Never  Vaping Use   Vaping Use: Never used  Substance and Sexual Activity   Alcohol use: Never   Drug use: Never   Sexual activity: Not Currently    Birth control/protection: Post-menopausal  Other Topics Concern   Not on file  Social History Narrative   Not on file   Social Determinants of Health   Financial Resource Strain: Low Risk  (02/27/2023)   Overall Financial Resource Strain (CARDIA)    Difficulty of Paying Living Expenses: Not hard at all  Food Insecurity: No Food Insecurity (02/27/2023)   Hunger Vital Sign    Worried About Running Out of Food in the Last Year: Never true    Ran Out of Food in the Last Year: Never true  Transportation Needs: No Transportation Needs (02/27/2023)   PRAPARE - Administrator, Civil Service (Medical): No    Lack of Transportation (Non-Medical): No  Physical Activity: Unknown (02/27/2023)   Exercise Vital Sign    Days of Exercise per Week: 0 days    Minutes of Exercise per Session: Not on file  Stress: No Stress Concern Present (02/27/2023)   [employer] of Occupational Health - Occupational Stress Questionnaire    Feeling of Stress : Not at all  Social Connections: Socially Integrated (02/27/2023)   Social Connection and Isolation Panel [NHANES]    Frequency of Communication with Friends and Family: Twice a week    Frequency of Social Gatherings with Friends and Family: More than three times a week    Attends Religious Services: More than 4 times per year    Active Member of [employer] or Organizations: Yes    Attends Banker Meetings: 1 to 4 times per year    Marital Status: Married  Catering manager Violence: Not At Risk (10/21/2022)   Humiliation, Afraid, Rape, and Kick questionnaire    Fear of Current or Ex-Partner: No    Emotionally Abused: No    Physically Abused: No    Sexually Abused: No    Family History  Problem Relation Age of Onset   Sudden death Neg Hx     ROS: no fevers or chills, productive cough, hemoptysis, dysphasia, odynophagia, melena, hematochezia, dysuria, hematuria, rash, seizure activity, orthopnea, PND, claudication. Remaining systems are negative.  Physical Exam: Well-developed well-nourished in no acute distress.  Skin is warm and dry.  HEENT is normal.  Neck is supple.  Chest is clear to auscultation with normal expansion.  Cardiovascular exam is irregular Abdominal exam nontender or distended. No masses palpated. Extremities show trace edema. neuro residual right-sided weakness from recent CVA  ECG-fibrillation at a rate of 101, normal axis.  Personally reviewed  A/P  1 permanent atrial fibrillation-patient has had follow-up electrocardiogram demonstrating recurrent atrial fibrillation.  Plan will be rate control and anticoagulation.  Continue metoprolol at present dose.  Continue apixaban.  2 chronic diastolic congestive heart failure-patient mildly volume overloaded on examination.  Lasix was increased to 40 mg daily 2 days ago.  She will continue that dose and take an  additional 20 mg for weight gain of 2 to 3 pounds.  Check potassium, renal function and BNP in 1 week.  Continue Jardiance.  3 hypertension-patient's blood pressure is controlled.  Continue present medications.  4 recent CVA-continue Brilinta status  post stenting of left internal carotid artery.  Follow-up neurology.  Olga Millers, MD

## 2023-03-26 NOTE — ED Triage Notes (Signed)
Pt arrives with c/o ABD pain that started last week. Pt endorses diarrhea. Pt was seen by PCP and prescribed an antibiotic and zofran. Pt sent from PCP for evaluation since meds were not working.

## 2023-03-28 ENCOUNTER — Telehealth: Payer: Self-pay | Admitting: Family Medicine

## 2023-03-28 NOTE — Telephone Encounter (Signed)
Christie from [facility] just wanted to advise pt missed her appt this week because the pt is sick. Stated they will resume speech therapy next week.

## 2023-03-28 NOTE — Telephone Encounter (Signed)
Noted  

## 2023-03-29 ENCOUNTER — Telehealth: Payer: Self-pay | Admitting: Family Medicine

## 2023-03-29 NOTE — Telephone Encounter (Signed)
Loretta Gates [facility]) called stating pt had a missed visit due to not feeling well.

## 2023-03-29 NOTE — Telephone Encounter (Signed)
Fyi.

## 2023-03-31 NOTE — Progress Notes (Unsigned)
[facility] [address] [place], [place] [address] [phone]  Date:  04/02/2023   Name:  Loretta Gates   DOB:  13-Apr-1936   MRN:  [id number]  PCP:  Loretta Cables, MD    Chief Complaint: continued abd pain (Was seen at the ER on 03/26/23. Pain is come and go, more in the center of the abdomen. And pain in her "bottom"  she says she feels constipated. She also mentioned some pain in her thighs and arms. R hand swelling. )   History of Present Illness:  Loretta Gates is a 87 y.o. very pleasant female patient who presents with the following:  Patient seen today for follow-up.  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  She was admitted from 4/30- 5/7 at the hospital and then 5/7 through 5/19 at rehab.  Stroke likely caused by a temporary pause in her blood thinner for a bladder procedure  Since that visit patient's daughter has contacted [place] few times with concern about her mom having abdominal pain and just not doing well Her current issue really started 2 or 3 weeks after she was discharged home from rehab -her daughter notes after Loretta Gates came home from rehab she seemed to be doing relatively well, she seemed comfortable, was in good spirits and felt like doing things.  However, on approximately June 10 her condition changed -she felt a lot more tired, complained of abdominal discomfort and has just not seem to bounce back  She was seen by my partner Loretta Hopes, NP on June 14-decided to treat presumptively for diverticulitis with Augmentin Unfortunately she did not improve, she went to the ER on 6/17-Per ER notes she was improving by that time They got a CT abdomen pelvis, negative for any acute issue.  Labs  reassuring  Of note, CT from the ER did mention a possible flash fill hemangioma of the liver which can be further evaluated with MRI  Most recent labs dated 6/17 in the ER overall reassuring-minimal leukocytosis 11.1, minimal anemia hemoglobin 11.9 Liver, kidney function unremarkable  Loretta Gates notes she is still is having issues with abdominal pain off and on, and is constipated She had diarrhea for 1 day about a week ago, since that time she has passed only small amounts of hard stool They stopped her abx 3 days ago -they were not sure if this may be contributing to her symptoms.  In any case stopping antibiotics is fine as her CT scan did not show diverticulitis No vomiting or fever during this episode  They do notice a little swelling of her right hand perhaps just today  They have tried metamucil, stool softener- no effect as of yet They wonder if her brain injury has affected her stool function  Here today with husband Loretta Gates- married for 60 years -and her daughter Loretta Gates who is her primary caregiver after the stroke  They are seeing cardiology later this week.  Loretta Gates is currently taking 20 mg of furosemide Echo results January, 2024  1. Left ventricular ejection fraction, by estimation, is 55 to 60%. The  left ventricle has normal function. The left ventricle has no regional  wall motion abnormalities. Left ventricular diastolic function could not  be evaluated.  2. Right ventricular systolic function is normal. The right ventricular  size is mildly enlarged. There is moderately elevated pulmonary artery  systolic pressure.   3. Left atrial size was moderately dilated.   4. Right atrial size was mildly dilated.   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   ICAO (internal carotid artery occlusion), left 02/06/2023   Chest pain  12/08/2022   Class 3 obesity (HCC) 10/21/2022   Acute on chronic diastolic CHF (congestive heart failure) (HCC) 10/20/2022   IDA (iron deficiency anemia) 01/13/2022   Atrial fibrillation (HCC) 01/05/2022   Breast cancer (HCC) 01/05/2022   Primary hyperparathyroidism (HCC) 01/05/2022   Essential hypertension 01/05/2022   Age-related nuclear cataract of both eyes 12/11/2018   Abnormal INR 01/29/2018   Long term (current) use of anticoagulants 01/29/2018   Encounter for therapeutic drug monitoring 01/29/2018   Vitamin D deficiency 05/03/2017   S/P laparoscopic cholecystectomy 02/26/2017   Calculus of gallbladder without cholecystitis without obstruction 02/23/2017   Diarrhea of presumed infectious origin 02/23/2017   Hyperglycemia 02/23/2017   Right upper quadrant abdominal pain 02/23/2017   Lower extremity edema 01/18/2016   Age-related osteoporosis without current pathological fracture 01/17/2016   Diverticulosis of large intestine without hemorrhage 01/17/2016   GERD (gastroesophageal reflux disease) 01/17/2016   History of left breast cancer 01/17/2016   Multinodular goiter 01/17/2016   OAB (overactive bladder) 01/17/2016   History of cancer chemotherapy 03/06/2015   History of left mastectomy 03/06/2015   Malignant neoplasm of overlapping sites of left female breast (HCC) 03/06/2015    Past Medical History:  Diagnosis Date   Anemia    Bladder cancer [facility])    urologist--- dr pace   Chronic diastolic (congestive) heart failure (HCC)    followed by dr Jens Som   Chronic venous insufficiency    Edema of both lower extremities    per pt wears compression hose   GERD (gastroesophageal reflux disease)    History of cancer chemotherapy    completed 2008 for left breast cancer   History of head and neck radiation 1946   per pt approx 1946 or 1947  (age 87) due to profound bilateral hearing loss told from scarlett fever/ measles,  once weekly for several weeks had  head/ neck  radiation,  hearing was restored without neededing hearing aids   History of left breast cancer 2008   malignant neoplasm of overlapping sites of left breast, ER+;  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 patel;   2006  s/p left thyroidectomy w/ right inferior parathyoidectomy  (per path speciman marked parathyroid but only thyroid tissue)   Hypertension    IDA (iron deficiency anemia)    hematology/ oncologist--- dr ennever/ sarah carter NP;  treated w/ IV iron infusions   Multinodular thyroid    Neuropathy    mild hands/ feet, uses cane   Nocturia more than twice per night    Nocturnal leg cramps    OA (osteoarthritis)    knees   OAB (overactive bladder)    Osteoporosis    PAF (paroxysmal atrial fibrillation) [facility])    cardiologist--- dr Jens Som   PONV (postoperative nausea and vomiting)    Vitamin D deficiency    Wears glasses  Past Surgical History:  Procedure Laterality Date   BREAST LUMPECTOMY WITH RADIOACTIVE SEED LOCALIZATION Right 10/25/2021   Procedure: RIGHT BREAST LUMPECTOMY WITH RADIOACTIVE SEED LOCALIZATION;  Surgeon: Harriette Bouillon, MD;  Location: [facility];  Service: General;  Laterality: Right;   CARDIOVERSION N/A 01/01/2023   Procedure: CARDIOVERSION;  Surgeon: [name], MD;  Location: [facility] ENDOSCOPY;  Service: Cardiovascular;  Laterality: N/A;   CHOLECYSTECTOMY, LAPAROSCOPIC  02/25/2017   @[facility]    COLONOSCOPY WITH ESOPHAGOGASTRODUODENOSCOPY (EGD)  2022   IR ANGIO INTRA EXTRACRAN SEL COM CAROTID INNOMINATE UNI R MOD SED  02/07/2023   IR ANGIO VERTEBRAL SEL SUBCLAVIAN INNOMINATE UNI L MOD SED  02/07/2023   IR CT HEAD LTD  02/07/2023   IR INTRAVSC STENT CERV CAROTID W/O EMB-PROT MOD SED INC ANGIO  02/07/2023   IR PERCUTANEOUS ART THROMBECTOMY/INFUSION INTRACRANIAL INC DIAG ANGIO  02/07/2023   IR RADIOLOGIST EVAL &  MGMT  03/10/2023   MASTECTOMY WITH AXILLARY LYMPH NODE DISSECTION Left 03/05/2007   @ [facility]   OVARIAN CYST SURGERY     age 61;   abdominal   RADIOLOGY WITH ANESTHESIA N/A 02/06/2023   Procedure: IR WITH ANESTHESIA;  Surgeon: Radiologist, Medication, MD;  Location: [facility] OR;  Service: Radiology;  Laterality: N/A;   THYROIDECTOMY, PARTIAL Left 2006   left lobectomy and right infertior parathyroidectomy   TONSILLECTOMY     child    Social History   Tobacco Use   Smoking status: Never   Smokeless tobacco: Never  Vaping Use   Vaping Use: Never used  Substance Use Topics   Alcohol use: Never   Drug use: Never    Family History  Problem Relation Age of Onset   Sudden death Neg Hx     Allergies  Allergen Reactions   Bee Venom Swelling    Bee stung her on the inside of her gum. Lips and gums started swelling. No SOB.   Levaquin [Levofloxacin] Hives and Itching   Peanut-Containing Drug Products Other (See Comments)    Stomach pain and diarrhea     Medication list has been reviewed and updated.  Current Outpatient Medications on File Prior to Visit  Medication Sig Dispense Refill   acetaminophen (TYLENOL) 325 MG tablet Take 2 tablets (650 mg total) by mouth every 4 (four) hours as needed for mild pain (or temp > 37.5 C (99.5 F)).     apixaban (ELIQUIS) 5 MG TABS tablet Take 1 tablet (5 mg total) by mouth 2 (two) times daily. 60 tablet    cinacalcet (SENSIPAR) 30 MG tablet Take 1 tablet (30 mg total) by mouth daily with breakfast. 30 tablet 0   empagliflozin (JARDIANCE) 10 MG TABS tablet TAKE ONE TABLET BY MOUTH ONE TIME DAILY (Patient taking differently: Take 10 mg by mouth daily.) 30 tablet 3   ergocalciferol (VITAMIN D2) 1.25 MG (50000 UT) capsule Take 50,000 Units by mouth every 14 (fourteen) days. Every other saturday     furosemide (LASIX) 20 MG tablet Alternate taking 20 mg po daily with taking 30 mg po daily ever other day 135 tablet 3   magnesium oxide (MAG-OX) 400 (240 Mg) MG  tablet Take 1 tablet (400 mg total) by mouth daily. 90 tablet 3   Metoprolol Tartrate (LOPRESSOR) 50 MG tablet Take 1.5 tablets (75 mg total) by mouth 2 (two) times daily. 45 tablet 0   omeprazole (PRILOSEC) 40 MG capsule Take 1 capsule (40 mg total) by mouth 2 (two) times daily before lunch and supper. (  Patient taking differently: Take 40 mg by mouth in the morning and at bedtime.) 180 capsule 1   ondansetron (ZOFRAN) 4 MG tablet Take 1 tablet (4 mg total) by mouth every 8 (eight) hours as needed for nausea or vomiting. 20 tablet 0   oxybutynin (DITROPAN XL) 15 MG 24 hr tablet Take 1 tablet (15 mg total) by mouth at bedtime. (Patient taking differently: Take 15 mg by mouth at bedtime.) 90 tablet 3   ticagrelor (BRILINTA) 90 MG TABS tablet Take 0.5 tablets (45 mg total) by mouth 2 (two) times daily. 60 tablet 2   No current facility-administered medications on file prior to visit.    Review of Systems:  As per HPI- otherwise negative.   Physical Examination: Vitals:   04/02/23 1457  BP: 122/64  Pulse: 71  Resp: 18  Temp: 97.7 F (36.5 C)  SpO2: 94%   Vitals:   04/02/23 1457  Weight: 186 lb 9.6 oz (84.6 kg)  Height: 4\' 10"  (1.473 m)   Body mass index is 39 kg/m. Ideal Body Weight: Weight in (lb) to have BMI = 25: 119.4  GEN: no acute distress.  Obese, seated in wheelchair.  Her speech fluency has improved since her last visit though she is still having some trouble thinking of what she would like to say.  There is right arm and hand weakness HEENT: Atraumatic, Normocephalic.  Ears and Nose: No external deformity. CV: rate controlled atrial fib, No M/G/R. No JVD. No thrill. No extra heart sounds. PULM: CTA B, no wheezes, crackles, rhonchi. No retractions. No resp. distress. No accessory muscle use. ABD: S, NT, ND. No rebound. No HSM.  Belly is benign on exam, active bowel sounds EXTR: No c/c/minimal lower extremity edema as would often be seen in 87 year old PSYCH: Normally  interactive. Conversant.  There is perhaps minimal puffiness in the dorsum of the right hand.  There is some bruising at the right AC fossa from recent IV start.  No tenderness of the forearm or upper arm  Wt Readings from Last 3 Encounters:  04/04/23 186 lb (84.4 kg)  04/02/23 186 lb 9.6 oz (84.6 kg)  03/26/23 181 lb (82.1 kg)    Assessment and Plan: Lower abdominal pain - Plan: Ambulatory referral to Gastroenterology, Urine Culture, CANCELED: DG Abd Acute W/Chest  Cystitis - Plan: cephALEXin (KEFLEX) 500 MG capsule  Patient seen today for follow-up.  As above, she unfortunately suffered a stroke at the end of April She came home from rehab on May 19.  She seen to be doing well except for sequelae of her stroke, but then on June 10 she complained of abdominal discomfort and diarrhea.  Since then diarrhea has resolved, but she continues to have intermittent abdominal discomfort and cramping and also constipation  She was seen in the ER on 6/17, she had lab work and also CT abdomen pelvis-all overall reassuring  Today it seems main concern is constipation and associated discomfort.  Will obtain abdominal films and also chest x-ray  Signed Abbe Amsterdam, MD  Received her imaging reports, called her daughter to go over results She does appear to have constipation on abdominal films. We previously discussed strategies to treat constipation with MiraLAX: OTC miralax will likely help with constipation Ok to take 2 or 3 doses a day until you are able to have a good bowel movement   However, also note likely mild pulmonary edema, suspect due to CHF. We also note her weight is up despite poor appetite  Wt Readings from Last 3 Encounters:  04/04/23 186 lb (84.4 kg)  04/02/23 186 lb 9.6 oz (84.6 kg)  03/26/23 181 lb (82.1 kg)   I asked them to go ahead and take 2 doses of furosemide daily (40 mg total) until they see Dr. Jens Som Will also have her add 10 mill equivalents of potassium on  days she takes 40 mg of furosemide  Addnd 6/26- received prelim urine culture-sent MyChart message and prescription for Keflex Second addendum, final culture and sensitivities came in, Keflex should be effective They had not yet seen MyChart message so I called daughter at 8:40 PM and went over UTI diagnosis, medication sent to pharmacy.  She states understanding Results for orders placed or performed in visit on 04/02/23  Urine Culture   Specimen: Urine  Result Value Ref Range   MICRO NUMBER: [id number]    SPECIMEN QUALITY: Adequate    Sample Source URINE    STATUS: FINAL    ISOLATE 1: Escherichia coli (A)       Susceptibility   Escherichia coli - URINE CULTURE, REFLEX    AMOX/CLAVULANIC <=2 Sensitive     AMPICILLIN 4 Sensitive     AMPICILLIN/SULBACTAM <=2 Sensitive     CEFAZOLIN* <=4 Not Reportable      * For infections other than uncomplicated UTI caused by E. coli, K. pneumoniae or P. mirabilis: Cefazolin is resistant if MIC > or = 8 mcg/mL. (Distinguishing susceptible versus intermediate for isolates with MIC < or = 4 mcg/mL requires additional testing.) For uncomplicated UTI caused by E. coli, K. pneumoniae or P. mirabilis: Cefazolin is susceptible if MIC <32 mcg/mL and predicts susceptible to the oral agents cefaclor, cefdinir, cefpodoxime, cefprozil, cefuroxime, cephalexin and loracarbef.     CEFTAZIDIME <=1 Sensitive     CEFEPIME <=1 Sensitive     CEFTRIAXONE <=1 Sensitive     CIPROFLOXACIN <=0.25 Sensitive     LEVOFLOXACIN <=0.12 Sensitive     GENTAMICIN <=1 Sensitive     IMIPENEM <=0.25 Sensitive     NITROFURANTOIN <=16 Sensitive     PIP/TAZO <=4 Sensitive     TOBRAMYCIN <=1 Sensitive     TRIMETH/SULFA* >=320 Resistant      * For infections other than uncomplicated UTI caused by E. coli, K. pneumoniae or P. mirabilis: Cefazolin is resistant if MIC > or = 8 mcg/mL. (Distinguishing susceptible versus intermediate for isolates with MIC < or = 4 mcg/mL  requires additional testing.) For uncomplicated UTI caused by E. coli, K. pneumoniae or P. mirabilis: Cefazolin is susceptible if MIC <32 mcg/mL and predicts susceptible to the oral agents cefaclor, cefdinir, cefpodoxime, cefprozil, cefuroxime, cephalexin and loracarbef. Legend: S = Susceptible  I = Intermediate R = Resistant  NS = Not susceptible * = Not tested  NR = Not reported **NN = See antimicrobic comments

## 2023-04-02 ENCOUNTER — Ambulatory Visit (INDEPENDENT_AMBULATORY_CARE_PROVIDER_SITE_OTHER): Payer: Medicare HMO | Admitting: Family Medicine

## 2023-04-02 ENCOUNTER — Ambulatory Visit (HOSPITAL_BASED_OUTPATIENT_CLINIC_OR_DEPARTMENT_OTHER)
Admission: RE | Admit: 2023-04-02 | Discharge: 2023-04-02 | Disposition: A | Discharge status: Home / Self Care | Payer: Medicare HMO | Source: Ambulatory Visit | Attending: Family Medicine | Admitting: Family Medicine

## 2023-04-02 ENCOUNTER — Encounter: Payer: Self-pay | Admitting: Family Medicine

## 2023-04-02 ENCOUNTER — Other Ambulatory Visit: Payer: Self-pay | Admitting: Family Medicine

## 2023-04-02 ENCOUNTER — Telehealth: Payer: Self-pay | Admitting: Family Medicine

## 2023-04-02 VITALS — BP 122/64 | HR 71 | Temp 97.7°F | Resp 18 | Ht <= 58 in | Wt 186.6 lb

## 2023-04-02 DIAGNOSIS — N3090 Cystitis, unspecified without hematuria: Secondary | ICD-10-CM

## 2023-04-02 DIAGNOSIS — R1030 Lower abdominal pain, unspecified: Secondary | ICD-10-CM | POA: Diagnosis not present

## 2023-04-02 DIAGNOSIS — J101 Influenza due to other identified influenza virus with other respiratory manifestations: Secondary | ICD-10-CM | POA: Diagnosis not present

## 2023-04-02 DIAGNOSIS — R062 Wheezing: Secondary | ICD-10-CM | POA: Insufficient documentation

## 2023-04-02 DIAGNOSIS — R109 Unspecified abdominal pain: Secondary | ICD-10-CM | POA: Diagnosis not present

## 2023-04-02 DIAGNOSIS — R918 Other nonspecific abnormal finding of lung field: Secondary | ICD-10-CM | POA: Diagnosis not present

## 2023-04-02 MED ORDER — CHOLESTYRAMINE 4 G PO PACK: 4.0000 g | PACK | Freq: Every day | ORAL | 3 refills | Status: DC

## 2023-04-02 MED ORDER — POTASSIUM CHLORIDE CRYS ER 10 MEQ PO TBCR: 10.0000 meq | EXTENDED_RELEASE_TABLET | Freq: Every day | ORAL | 1 refills | Status: DC

## 2023-04-02 NOTE — Telephone Encounter (Signed)
Please see below.

## 2023-04-02 NOTE — Telephone Encounter (Signed)
[facility] called and states family wants to discontinue services at this time due to other things going on. Just an [place]

## 2023-04-02 NOTE — Patient Instructions (Addendum)
OTC miralax will likely help with constipation Ok to take 2 or 3 doses a day until you are able to have a good bowel movement  Please stop by imaging on the ground floor to have plain films of your abdomen

## 2023-04-04 ENCOUNTER — Encounter: Payer: Self-pay | Admitting: Family Medicine

## 2023-04-04 ENCOUNTER — Ambulatory Visit: Payer: Medicare HMO | Attending: Cardiology | Admitting: Cardiology

## 2023-04-04 ENCOUNTER — Encounter: Payer: Self-pay | Admitting: Cardiology

## 2023-04-04 VITALS — BP 118/78 | HR 101 | Ht <= 58 in | Wt 186.0 lb

## 2023-04-04 DIAGNOSIS — I480 Paroxysmal atrial fibrillation: Secondary | ICD-10-CM

## 2023-04-04 DIAGNOSIS — I5030 Unspecified diastolic (congestive) heart failure: Secondary | ICD-10-CM | POA: Diagnosis not present

## 2023-04-04 DIAGNOSIS — I10 Essential (primary) hypertension: Secondary | ICD-10-CM | POA: Diagnosis not present

## 2023-04-04 LAB — URINE CULTURE
MICRO NUMBER:: 15118857
SPECIMEN QUALITY:: ADEQUATE

## 2023-04-04 MED ORDER — CEPHALEXIN 500 MG PO CAPS
500.0000 mg | ORAL_CAPSULE | Freq: Two times a day (BID) | ORAL | 0 refills | Status: DC
Start: 2023-04-04 — End: 2023-05-02

## 2023-04-04 NOTE — Addendum Note (Signed)
Addended by: Abbe Amsterdam C on: 04/04/2023 09:16 AM   Modules accepted: Orders

## 2023-04-04 NOTE — Patient Instructions (Signed)
Medication Instructions:   MAY TAKE EXTRA 20 MG OF FUROSEMIDE AS NEEDED FOR WEIGHT GAIN OF 2-3 LBS IN ONE DAY  *If you need a refill on your cardiac medications before your next appointment, please call your pharmacy*   Lab Work:  Your physician recommends that you return for lab work in: ONE WEEK-DO NOT NEED TO FAST  [employer] [facility] [employer]  Located on the 3 rd floor in [address] Hours-Monday - Friday 8 am-11:30 am and 1 pm -4 pm   If you have labs (blood work) drawn today and your tests are completely normal, you will receive your results only by: MyChart Message (if you have MyChart) OR A paper copy in the mail If you have any lab test that is abnormal or we need to change your treatment, we will call you to review the results.   Follow-Up: At [facility], you and your health needs are our priority.  As part of our continuing mission to provide you with exceptional heart care, we have created designated Provider Care Teams.  These Care Teams include your primary Cardiologist (physician) and Advanced Practice Providers (APPs -  Physician Assistants and Nurse Practitioners) who all work together to provide you with the care you need, when you need it.  We recommend signing up for the patient portal called "MyChart".  Sign up information is provided on this After Visit Summary.  MyChart is used to connect with patients for Virtual Visits (Telemedicine).  Patients are able to view lab/test results, encounter notes, upcoming appointments, etc.  Non-urgent messages can be sent to your provider as well.   To learn more about what you can do with MyChart, go to [email or web address].    Your next appointment:   3 month(s)  Provider:   Olga Millers, MD

## 2023-04-16 DIAGNOSIS — I5030 Unspecified diastolic (congestive) heart failure: Secondary | ICD-10-CM | POA: Diagnosis not present

## 2023-04-17 ENCOUNTER — Telehealth: Payer: Self-pay | Admitting: *Deleted

## 2023-04-17 DIAGNOSIS — R600 Localized edema: Secondary | ICD-10-CM

## 2023-04-17 LAB — BASIC METABOLIC PANEL
BUN/Creatinine Ratio: 20 (ref 12–28)
BUN: 17 mg/dL (ref 8–27)
CO2: 21 mmol/L (ref 20–29)
Calcium: 8.9 mg/dL (ref 8.7–10.3)
Chloride: 102 mmol/L (ref 96–106)
Creatinine, Ser: 0.86 mg/dL (ref 0.57–1.00)
Glucose: 117 mg/dL — ABNORMAL HIGH (ref 70–99)
Potassium: 4.3 mmol/L (ref 3.5–5.2)
Sodium: 139 mmol/L (ref 134–144)
eGFR: 66 mL/min/{1.73_m2} (ref >59)

## 2023-04-17 LAB — PRO B NATRIURETIC PEPTIDE: NT-Pro BNP: 3277 pg/mL — ABNORMAL HIGH (ref 0–738)

## 2023-04-17 MED ORDER — FUROSEMIDE 20 MG PO TABS
60.0000 mg | ORAL_TABLET | Freq: Every day | ORAL | 3 refills | Status: DC
Start: 2023-04-17 — End: 2023-04-18

## 2023-04-17 NOTE — Telephone Encounter (Signed)
Spoke with pt daughter, dr copeland has the patient taking 40 mg of furosemide since 04/02/23. Her weight is staying stable. Dr copeland did a x-ray that showed fluid in the lungs. The patient continues to have swelling in both feet that has not changed since the increase. She does not really have SOB but she pants during the day, like she is having trouble. This can occur at rest or with activity. Will make dr Jens Som aware.

## 2023-04-17 NOTE — Telephone Encounter (Signed)
-----   Message from [name], MD sent at 04/17/2023  7:51 AM EDT ----- Continue lasix 40 mg daily with an additional 20 mg daily for weight gain of 2 to 3 pounds or worsening pedal edema. Loretta Gates

## 2023-04-17 NOTE — Telephone Encounter (Signed)
Spoke with pt daughter, aware of dr Ludwig Clarks recommendations.

## 2023-04-18 ENCOUNTER — Telehealth: Payer: Self-pay | Admitting: Cardiology

## 2023-04-18 DIAGNOSIS — R600 Localized edema: Secondary | ICD-10-CM

## 2023-04-18 MED ORDER — FUROSEMIDE 20 MG PO TABS
60.0000 mg | ORAL_TABLET | Freq: Every day | ORAL | 3 refills | Status: DC
Start: 2023-04-18 — End: 2024-04-09

## 2023-04-18 NOTE — Telephone Encounter (Signed)
Pharmacy states instructions on lasix is not clear.   First instructions says 60 mg daily. Then the next instructions says 20 mg daily then alternate with 30 mg every other day.  I did see phone message from yesterday but it look the prescription was also called in yesterday.  Is patient supposed to be taking 60 mg daily or alternating between 20 mg/30 mg.

## 2023-04-18 NOTE — Telephone Encounter (Signed)
Pharmacist from [facility] is calling to get verification and verbal on furosemide (LASIX) 20 MG tablet due to discrepancy on directions

## 2023-04-18 NOTE — Telephone Encounter (Signed)
Patient is to take 60 mg daily with an extra 20 mg as needed for swelling. Refill sent to the pharmacy electronically.

## 2023-04-26 ENCOUNTER — Other Ambulatory Visit: Payer: Self-pay | Admitting: Family Medicine

## 2023-04-26 DIAGNOSIS — R600 Localized edema: Secondary | ICD-10-CM | POA: Diagnosis not present

## 2023-04-26 NOTE — Progress Notes (Unsigned)
[facility] [address], [address] [place], [place] [address] [phone]  Date:  05/02/2023   Name:  Loretta Gates   DOB:  1936-02-15   MRN:  [id number]  PCP:  Loretta Cables, MD    Chief Complaint: PHQ-9 8 Week Follow-up (Taking Lasix 40 mg w additional 20 per 2-3 lbs for CHF/DM urine due/Shingrix due- Medicare pt)   History of Present Illness:  Loretta Gates is a 87 y.o. very pleasant female patient who presents with the following:  Patient seen today for short-term follow-up Most recent visit with myself was in June-Notes from that visit: History of CHF, atrial fibs on Eliquis and metoprolol, more recently history of stroke Most recent visit with myself was May 22 after a recent admission for left middle cerebral arterial stroke as well as subarachnoid hemorrhage and then rehab.  She was admitted from 4/30- 5/7 at the hospital and then 5/7 through 5/19 at rehab.  Stroke likely caused by a temporary pause in her blood thinner for a bladder procedure Since that visit patient's daughter has contacted [place] few times with concern about her mom having abdominal pain and just not doing well Her current issue really started 2 or 3 weeks after she was discharged home from rehab -her daughter notes after Loretta Gates came home from rehab she seemed to be doing relatively well, she seemed comfortable, was in good spirits and felt like doing things.  However, on approximately June 10 her condition changed -she felt a lot more tired, complained of abdominal discomfort and has just not seem to bounce back//////////////////////////////////////////////////////////// Received her imaging reports, called her daughter to go over results She does appear to have constipation on abdominal films. We previously discussed strategies to treat constipation with MiraLAX: OTC miralax will likely help with constipation Ok to take 2 or 3 doses a day until you are  able to have a good bowel movement  However, also note likely mild pulmonary edema, suspect due to CHF. We also note her weight is up despite poor appetite    Wt Readings from Last 3 Encounters:  04/04/23 186 lb (84.4 kg)  04/02/23 186 lb 9.6 oz (84.6 kg)  03/26/23 181 lb (82.1 kg)    I asked them to go ahead and take 2 doses of furosemide daily (40 mg total) until they see Dr. Jens Som Will also have her add 10 mill equivalents of potassium on days she takes 40 mg of furosemide   Addnd 6/26- received prelim urine culture-sent MyChart message and prescription for Keflex Second addendum, final culture and sensitivities came in, Keflex should be effective They had not yet seen MyChart message so I called daughter at 8:40 PM and went over UTI diagnosis, medication sent to pharmacy.  She states understanding  She saw her cardiologist, Dr. Jens Som for follow-up at the end of June: 1 permanent atrial fibrillation-patient has had follow-up electrocardiogram demonstrating recurrent atrial fibrillation.  Plan will be rate control and anticoagulation.  Continue metoprolol at present dose.  Continue apixaban. 2 chronic diastolic congestive heart failure-patient mildly volume overloaded on examination.  Lasix was increased to 40 mg daily 2 days ago.  She will continue that dose and take an additional 20 mg for weight gain of 2 to 3 pounds.  Check potassium, renal function and BNP in 1 week.  Continue Jardiance. 3 hypertension-patient's blood pressure is controlled.  Continue present medications. 4 recent CVA-continue Brilinta status post  stenting of left internal carotid artery.  Follow-up neurology.   She is taking lasix 60 daily- she was taking 80 for a few days per Dr Jens Som but now has cut back to 60  She is taking just 10 meq potassium now Patient notes she is feeling much better, her swelling is much improved.  She denies any shortness of breath or orthopnea. Wt Readings from Last 3  Encounters:  05/02/23 176 lb (79.8 kg)  04/04/23 186 lb (84.4 kg)  04/02/23 186 lb 9.6 oz (84.6 kg)   Her weight is down as above     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   ICAO (internal carotid artery occlusion), left 02/06/2023   Chest pain 12/08/2022   Class 3 obesity (HCC) 10/21/2022   Acute on chronic diastolic CHF (congestive heart failure) (HCC) 10/20/2022   IDA (iron deficiency anemia) 01/13/2022   Atrial fibrillation (HCC) 01/05/2022   Breast cancer (HCC) 01/05/2022   Primary hyperparathyroidism (HCC) 01/05/2022   Essential hypertension 01/05/2022   Age-related nuclear cataract of both eyes 12/11/2018   Abnormal INR 01/29/2018   Long term (current) use of anticoagulants 01/29/2018   Encounter for therapeutic drug monitoring 01/29/2018   Vitamin D deficiency 05/03/2017   S/P laparoscopic cholecystectomy 02/26/2017   Calculus of gallbladder without cholecystitis without obstruction 02/23/2017   Diarrhea of presumed infectious origin 02/23/2017   Hyperglycemia 02/23/2017   Right upper quadrant abdominal pain 02/23/2017   Lower extremity edema 01/18/2016   Age-related osteoporosis without current pathological fracture 01/17/2016   Diverticulosis of large intestine without hemorrhage 01/17/2016   GERD (gastroesophageal reflux disease) 01/17/2016   History of left breast cancer 01/17/2016   Multinodular goiter 01/17/2016   OAB (overactive bladder) 01/17/2016   History of cancer chemotherapy 03/06/2015   History of left mastectomy 03/06/2015   Malignant neoplasm of overlapping sites of left female breast (HCC) 03/06/2015    Past Medical History:  Diagnosis Date   Anemia    Bladder cancer [facility])    urologist--- dr pace   Chronic diastolic (congestive) heart failure (HCC)    followed by dr Jens Som   Chronic venous  insufficiency    Edema of both lower extremities    per pt wears compression hose   GERD (gastroesophageal reflux disease)    History of cancer chemotherapy    completed 2008 for left breast cancer   History of head and neck radiation 1946   per pt approx 1946 or 1947  (age 80) due to profound bilateral hearing loss told from scarlett fever/ measles,  once weekly for several weeks had  head/ neck radiation,  hearing was restored without neededing hearing aids   History of left breast cancer 2008   malignant neoplasm of overlapping sites of left breast, ER+;  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 patel;   2006  s/p left thyroidectomy w/ right inferior parathyoidectomy  (per path speciman marked parathyroid but only thyroid tissue)   Hypertension    IDA (iron deficiency anemia)    hematology/ oncologist--- dr ennever/ sarah carter NP;  treated w/ IV iron infusions   Multinodular thyroid    Neuropathy    mild hands/ feet, uses cane   Nocturia more than  twice per night    Nocturnal leg cramps    OA (osteoarthritis)    knees   OAB (overactive bladder)    Osteoporosis    PAF (paroxysmal atrial fibrillation) [facility])    cardiologist--- dr Jens Som   PONV (postoperative nausea and vomiting)    Vitamin D deficiency    Wears glasses     Past Surgical History:  Procedure Laterality Date   BREAST LUMPECTOMY WITH RADIOACTIVE SEED LOCALIZATION Right 10/25/2021   Procedure: RIGHT BREAST LUMPECTOMY WITH RADIOACTIVE SEED LOCALIZATION;  Surgeon: Harriette Bouillon, MD;  Location:  SURGERY CENTER;  Service: General;  Laterality: Right;   CARDIOVERSION N/A 01/01/2023   Procedure: CARDIOVERSION;  Surgeon: [name], MD;  Location: [facility] ENDOSCOPY;  Service: Cardiovascular;  Laterality: N/A;   CHOLECYSTECTOMY, LAPAROSCOPIC  02/25/2017   @[facility]    COLONOSCOPY  WITH ESOPHAGOGASTRODUODENOSCOPY (EGD)  2022   IR ANGIO INTRA EXTRACRAN SEL COM CAROTID INNOMINATE UNI R MOD SED  02/07/2023   IR ANGIO VERTEBRAL SEL SUBCLAVIAN INNOMINATE UNI L MOD SED  02/07/2023   IR CT HEAD LTD  02/07/2023   IR INTRAVSC STENT CERV CAROTID W/O EMB-PROT MOD SED INC ANGIO  02/07/2023   IR PERCUTANEOUS ART THROMBECTOMY/INFUSION INTRACRANIAL INC DIAG ANGIO  02/07/2023   IR RADIOLOGIST EVAL & MGMT  03/10/2023   MASTECTOMY WITH AXILLARY LYMPH NODE DISSECTION Left 03/05/2007   @ [facility]   OVARIAN CYST SURGERY     age 39;   abdominal   RADIOLOGY WITH ANESTHESIA N/A 02/06/2023   Procedure: IR WITH ANESTHESIA;  Surgeon: Radiologist, Medication, MD;  Location: [facility] OR;  Service: Radiology;  Laterality: N/A;   THYROIDECTOMY, PARTIAL Left 2006   left lobectomy and right infertior parathyroidectomy   TONSILLECTOMY     child    Social History   Tobacco Use   Smoking status: Never   Smokeless tobacco: Never  Vaping Use   Vaping status: Never Used  Substance Use Topics   Alcohol use: Never   Drug use: Never    Family History  Problem Relation Age of Onset   Sudden death Neg Hx     Allergies  Allergen Reactions   Bee Venom Swelling    Bee stung her on the inside of her gum. Lips and gums started swelling. No SOB.   Levaquin [Levofloxacin] Hives and Itching   Peanut-Containing Drug Products Other (See Comments)    Stomach pain and diarrhea     Medication list has been reviewed and updated.  Current Outpatient Medications on File Prior to Visit  Medication Sig Dispense Refill   acetaminophen (TYLENOL) 325 MG tablet Take 2 tablets (650 mg total) by mouth every 4 (four) hours as needed for mild pain (or temp > 37.5 C (99.5 F)).     apixaban (ELIQUIS) 5 MG TABS tablet Take 1 tablet (5 mg total) by mouth 2 (two) times daily. 60 tablet    cholestyramine (QUESTRAN) 4 g packet Take 1 packet (4 g total) by mouth daily at 6 PM. 90 packet 3   cinacalcet (SENSIPAR) 30 MG tablet Take 1 tablet  (30 mg total) by mouth daily with breakfast. 30 tablet 0   ergocalciferol (VITAMIN D2) 1.25 MG (50000 UT) capsule Take 50,000 Units by mouth every 14 (fourteen) days. Every other saturday     furosemide (LASIX) 20 MG tablet Take 3 tablets (60 mg total) by mouth daily. Take an extra 20 mg as needed for swelling 270 tablet 3   JARDIANCE 10 MG TABS tablet TAKE  ONE TABLET BY MOUTH ONE TIME DAILY 30 tablet 3   magnesium oxide (MAG-OX) 400 (240 Mg) MG tablet Take 1 tablet (400 mg total) by mouth daily. 90 tablet 3   Metoprolol Tartrate (LOPRESSOR) 50 MG tablet Take 1.5 tablets (75 mg total) by mouth 2 (two) times daily. 45 tablet 0   omeprazole (PRILOSEC) 40 MG capsule Take 1 capsule (40 mg total) by mouth 2 (two) times daily before lunch and supper. (Patient taking differently: Take 40 mg by mouth in the morning and at bedtime.) 180 capsule 1   ondansetron (ZOFRAN) 4 MG tablet Take 1 tablet (4 mg total) by mouth every 8 (eight) hours as needed for nausea or vomiting. 20 tablet 0   oxybutynin (DITROPAN XL) 15 MG 24 hr tablet Take 1 tablet (15 mg total) by mouth at bedtime. (Patient taking differently: Take 15 mg by mouth at bedtime.) 90 tablet 3   potassium chloride (KLOR-CON M) 10 MEQ tablet Take 1 tablet (10 mEq total) by mouth daily. Use when taking more than 20 mg of furosemide daily 30 tablet 1   ticagrelor (BRILINTA) 90 MG TABS tablet Take 0.5 tablets (45 mg total) by mouth 2 (two) times daily. 60 tablet 2   No current facility-administered medications on file prior to visit.    Review of Systems:  As per HPI- otherwise negative.   Physical Examination: Vitals:   05/02/23 1017  BP: 132/80  Pulse: 87  Resp: 18  Temp: 98 F (36.7 C)  SpO2: 96%   Vitals:   05/02/23 1017  Weight: 176 lb (79.8 kg)  Height: 4\' 10"  (1.473 m)   Body mass index is 36.78 kg/m. Ideal Body Weight: Weight in (lb) to have BMI = 25: 119.4  GEN: no acute distress.  Obese, seated in wheelchair.  Looks well  today, her speech is improving. HEENT: Atraumatic, Normocephalic.  Ears and Nose: No external deformity. CV: RRR, No M/G/R. No JVD. No thrill. No extra heart sounds. PULM: CTA B, no wheezes, crackles, rhonchi. No retractions. No resp. distress. No accessory muscle use. ABD: S, NT, ND EXTR: No c/c/mild swelling in both legs, improved from previous.  Good skin integrity on both legs and feet PSYCH: Normally interactive. Conversant.    Assessment and Plan: Medication monitoring encounter - Plan: Basic metabolic panel  Leukocytosis, unspecified type - Plan: CBC  Chronic heart failure with preserved ejection fraction (HCC) - Plan: B Nat Peptide  H/O ischemic left MCA stroke  Patient seen today for follow-up.  She was recently seen by cardiology and furosemide was adjusted due to fluid overload.  She now seems to be euvolemic.  Check labs as above, we may need to adjust her potassium.  I will refill her potassium in any case  Will follow-up on mild leukocytosis today Stroke symptoms continue to improve, patient and her daughter are pleased with her progress  Signed Abbe Amsterdam, MD  Received labs as below, message to patient Will increase potassium to 20 mill equivalents daily Results for orders placed or performed in visit on 05/02/23  Basic metabolic panel  Result Value Ref Range   Sodium 141 135 - 145 mEq/L   Potassium 3.7 3.5 - 5.1 mEq/L   Chloride 103 96 - 112 mEq/L   CO2 25 19 - 32 mEq/L   Glucose, Bld 135 (H) 70 - 99 mg/dL   BUN 20 6 - 23 mg/dL   Creatinine, Ser 4.40 0.40 - 1.20 mg/dL   GFR 10.27 >25.36 mL/min  Calcium 9.1 8.4 - 10.5 mg/dL  CBC  Result Value Ref Range   WBC 8.4 4.0 - 10.5 K/uL   RBC 4.96 3.87 - 5.11 Mil/uL   Platelets 322.0 150.0 - 400.0 K/uL   Hemoglobin 12.7 12.0 - 15.0 g/dL   HCT 40.9 81.1 - 91.4 %   MCV 82.6 78.0 - 100.0 fl   MCHC 31.0 30.0 - 36.0 g/dL   RDW 78.2 (H) 95.6 - 21.3 %  B Nat Peptide  Result Value Ref Range   Pro B Natriuretic  peptide (BNP) 307.0 (H) 0.0 - 100.0 pg/mL

## 2023-04-27 LAB — BASIC METABOLIC PANEL
BUN/Creatinine Ratio: 23 (ref 12–28)
BUN: 19 mg/dL (ref 8–27)
CO2: 23 mmol/L (ref 20–29)
Calcium: 9.5 mg/dL (ref 8.7–10.3)
Chloride: 103 mmol/L (ref 96–106)
Creatinine, Ser: 0.84 mg/dL (ref 0.57–1.00)
Glucose: 100 mg/dL — ABNORMAL HIGH (ref 70–99)
Potassium: 3.8 mmol/L (ref 3.5–5.2)
Sodium: 143 mmol/L (ref 134–144)
eGFR: 68 mL/min/{1.73_m2} (ref >59)

## 2023-05-01 ENCOUNTER — Encounter: Payer: Medicare HMO | Attending: Physical Medicine and Rehabilitation | Admitting: Physical Medicine and Rehabilitation

## 2023-05-01 ENCOUNTER — Encounter: Payer: Self-pay | Admitting: Physical Medicine and Rehabilitation

## 2023-05-01 VITALS — BP 133/73 | HR 82 | Ht <= 58 in

## 2023-05-01 DIAGNOSIS — E119 Type 2 diabetes mellitus without complications: Secondary | ICD-10-CM | POA: Diagnosis not present

## 2023-05-01 DIAGNOSIS — I6602 Occlusion and stenosis of left middle cerebral artery: Secondary | ICD-10-CM | POA: Insufficient documentation

## 2023-05-01 DIAGNOSIS — K5730 Diverticulosis of large intestine without perforation or abscess without bleeding: Secondary | ICD-10-CM | POA: Diagnosis not present

## 2023-05-01 NOTE — Progress Notes (Signed)
   Subjective:    Patient ID: Loretta Gates, female    DOB: 12-27-1935, 87 y.o.   MRN: [id number]  HPI  Mrs. Cavendish is an 87 year old woman who presents after CIR admission for left MCA CVA  1) Left MCA CVA -hopeful that things are improving at home.  -had home therapy -feels like she is too strong for therapy -walks on the street in front of her house -walks when she does her grocery shopping  2) Diverticulitis: -this prevented her from finishing her home therapy -constipation contributed. -had nausea   Pain Inventory No pain  Bowel 7 stools per week  Bladder Normal  Mobilty Use cane, walker, wheelchair Transfer alone Climbs steps  Function Need assistance with bathing, household duties  Neuro Merilyn Baba Trouble walking    Review of Systems  Musculoskeletal:  Positive for gait problem.  All other systems reviewed and are negative.      Objective:   Physical Exam Gen: no distress, normal appearing HEENT: oral mucosa pink and moist, NCAT Cardio: Reg rate Chest: normal effort, normal rate of breathing Abd: soft, non-distended Ext: no edema Psych: pleasant, normal affect Skin: intact Neuro: Alert and oriented x3 MSK: mild weakness in right hand       Assessment & Plan:  1) Left MCA CVA -discussed that neurology would like her to continue Eliqiuis and Brilinta -discussed follow-up with neurology -discussed current functional status -continue HEP -discussed planning trips with family  2) Osteoporosis: -discussed that she can restart the Prolia -discussed Prolia risks and benefits  3) Bladder pain post- procedure -discussed use of lidocaine jelly, calling primary  4) Type 2 diabetes: -continue Jardiance

## 2023-05-02 ENCOUNTER — Ambulatory Visit (INDEPENDENT_AMBULATORY_CARE_PROVIDER_SITE_OTHER): Payer: Medicare HMO | Admitting: Family Medicine

## 2023-05-02 ENCOUNTER — Encounter: Payer: Self-pay | Admitting: Family Medicine

## 2023-05-02 VITALS — BP 132/80 | HR 87 | Temp 98.0°F | Resp 18 | Ht <= 58 in | Wt 176.0 lb

## 2023-05-02 DIAGNOSIS — Z5181 Encounter for therapeutic drug level monitoring: Secondary | ICD-10-CM

## 2023-05-02 DIAGNOSIS — I5032 Chronic diastolic (congestive) heart failure: Secondary | ICD-10-CM

## 2023-05-02 DIAGNOSIS — Z8673 Personal history of transient ischemic attack (TIA), and cerebral infarction without residual deficits: Secondary | ICD-10-CM

## 2023-05-02 DIAGNOSIS — D72829 Elevated white blood cell count, unspecified: Secondary | ICD-10-CM

## 2023-05-02 LAB — BASIC METABOLIC PANEL
BUN: 20 mg/dL (ref 6–23)
CO2: 25 mEq/L (ref 19–32)
Calcium: 9.1 mg/dL (ref 8.4–10.5)
Chloride: 103 mEq/L (ref 96–112)
Creatinine, Ser: 0.86 mg/dL (ref 0.40–1.20)
GFR: 61.75 mL/min (ref >60.00)
Glucose, Bld: 135 mg/dL — ABNORMAL HIGH (ref 70–99)
Potassium: 3.7 mEq/L (ref 3.5–5.1)
Sodium: 141 mEq/L (ref 135–145)

## 2023-05-02 LAB — CBC
HCT: 41 % (ref 36.0–46.0)
Hemoglobin: 12.7 g/dL (ref 12.0–15.0)
MCHC: 31 g/dL (ref 30.0–36.0)
MCV: 82.6 fl (ref 78.0–100.0)
Platelets: 322 10*3/uL (ref 150.0–400.0)
RBC: 4.96 Mil/uL (ref 3.87–5.11)
RDW: 17.5 % — ABNORMAL HIGH (ref 11.5–15.5)
WBC: 8.4 10*3/uL (ref 4.0–10.5)

## 2023-05-02 LAB — BRAIN NATRIURETIC PEPTIDE: Pro B Natriuretic peptide (BNP): 307 pg/mL — ABNORMAL HIGH (ref 0.0–100.0)

## 2023-05-02 MED ORDER — POTASSIUM CHLORIDE CRYS ER 20 MEQ PO TBCR: 20.0000 meq | EXTENDED_RELEASE_TABLET | Freq: Every day | ORAL | 1 refills | Status: DC

## 2023-05-02 NOTE — Patient Instructions (Signed)
Good to see you- it seems like you are doing quite well!   I will be in touch with your potassium results and we can make any adjustment that may be needed

## 2023-05-15 ENCOUNTER — Encounter: Payer: Self-pay | Admitting: Neurology

## 2023-05-15 ENCOUNTER — Ambulatory Visit: Payer: Medicare HMO | Admitting: Neurology

## 2023-05-15 VITALS — BP 135/79 | HR 80 | Ht 58.5 in | Wt 172.0 lb

## 2023-05-15 DIAGNOSIS — I69320 Aphasia following cerebral infarction: Secondary | ICD-10-CM

## 2023-05-15 DIAGNOSIS — R413 Other amnesia: Secondary | ICD-10-CM

## 2023-05-15 DIAGNOSIS — I6602 Occlusion and stenosis of left middle cerebral artery: Secondary | ICD-10-CM

## 2023-05-15 DIAGNOSIS — I6522 Occlusion and stenosis of left carotid artery: Secondary | ICD-10-CM

## 2023-05-15 DIAGNOSIS — G3184 Mild cognitive impairment, so stated: Secondary | ICD-10-CM

## 2023-05-15 NOTE — Progress Notes (Signed)
[facility] [address] [place]. [place] [address] [phone]       OFFICE FOLLOW-UP VISIT NOTE  Loretta Gates Date of Birth:  24-May-1936 Medical Record Number:  [id number]   Referring MD: Nada Maclachlan PA-C  Reason for Referral: Stroke follow-up  HPI: Loretta Gates is a 87 year old pleasant Caucasian lady seen today for initial office follow-up visit following hospital admission for stroke in April 2024.  She is accompanied by her husband and daughter today and history is obtained from them as well as review of electronic medical records.  I personally reviewed pertinent available imaging films in PACS.  She has past medical history of bladder cancer, chronic diastolic heart failure, chronic venous insufficiency, breast cancer status postchemotherapy in 2008, prior head and neck radiation in 1940s at age 46, prior left thyroidectomy, right inferior parathyroid ectomy, hypertension, anemia, mild neuropathy, vitamin D deficiency and paroxysmal A-fib on Eliquis s/p failed cardioversion 2 months prior.  She presented to [facility] emergency room on 02/06/2023 with recurrent spells of left eye transient painless vision loss.  First spell occurred a few days prior when she had complete whiteout of vision that progressed to 2 to blackening of vision lasting about less than a minute then gradually resolving back to baseline over a period of 3 minutes.  Second spell occurred on the day of admission which also lasted barely a minute or so and third spell also occurred on the same day lasting a few minutes.  Following the last episode that day she did not have any right-sided weakness numbness facial droop coordination and speech difficulties and gait unsteadiness She had been off Eliquis for 3 days for a bladder scraping procedure and had just restarted Eliquis and taken only her first dose back earlier that day.  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  Her NIH stroke scale was 0 and premorbid modified Rankin was also 0.  Due to her neurological instability with recurrent transient symptoms it was decided to revascularize her occluded left carotid as well as intracranial left M2 which was done successfully by Dr. Corliss Skains with TICI3 revascularization of the left M2 and rescue proximal left ICA angioplasty and stenting.  Postprocedure CT scan showed mild left perisylvian subarachnoid hemorrhage.  MRI scan showed a large posterior left MCA infarct with small left cerebellum, left occipital lobe infarcts and subarachnoid hemorrhage in the left sylvian fissure over the posterior left frontal lobe.  Follow-up CT scan showed stable appearance of the small left temporal and sylvian subarachnoid hemorrhage.  2D echo showed ejection fraction of 55 to 60% and was done in January 2024 hence not repeated.  LDL cholesterol 58 mg percent.  Hemoglobin A1c was 6.2.  Urine drug screen was negative.  Patient had been on Eliquis prior to admission.  She was temporarily placed on aspirin and Brilinta following carotid stent and subsequently switched to Eliquis and Brilinta at discharge.  Patient was transferred to inpatient rehab and did well.  She has been at home now for last 2 months.  She finished home physical therapy.  She is able to walk with a walker when walking outdoors and long distances but can walk short distances by herself.  She walks slowly and carefully and has not had any falls.  She still has some occasional word hesitancy but speech seems quite improved.  She can have brief conversations but is not able to speak long sentences.  Family has noticed decreased short-term memory though remote memory seems intact.  She is mostly independent in most  activities of daily living though she needs some help with shower.  She is tolerating Eliquis and Brilinta well but does bruise easily but has had no bleeding episodes.  She has had some mild episodes of sundowning and confusion at night.  There is no family history of dementia.  She still has persistent right-sided vision loss which has not improved  ROS:   14 system review of systems is positive for speech difficulties, memory loss, confusion, sundowning, bruising, gait and balance difficulties and all other systems negative  PMH:  Past Medical History:  Diagnosis Date   Anemia    Bladder cancer [facility])    urologist--- dr pace   Chronic diastolic (congestive) heart failure (HCC)    followed by dr Jens Som   Chronic venous insufficiency    Edema of both lower extremities    per pt wears compression hose   GERD (gastroesophageal reflux disease)    History of cancer chemotherapy    completed 2008 for left breast cancer   History of head and neck radiation 1946   per pt approx 1946 or 1947  (age 29) due to 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 patel;   2006  s/p left thyroidectomy w/ right inferior parathyoidectomy  (per path speciman marked parathyroid but only thyroid tissue)   Hypertension    IDA (iron deficiency anemia)    hematology/ oncologist--- dr ennever/ sarah carter NP;  treated w/ IV iron infusions   Multinodular thyroid    Neuropathy    mild hands/ feet, uses cane   Nocturia more than twice per night    Nocturnal leg cramps    OA (osteoarthritis)    knees   OAB  (overactive bladder)    Osteoporosis    PAF (paroxysmal atrial fibrillation) [facility])    cardiologist--- dr Jens Som   PONV (postoperative nausea and vomiting)    Vitamin D deficiency    Wears glasses     Social History:  Social History   Socioeconomic History   Marital status: Married    Spouse name: Not on file   Number of children: Not on file   Years of education: Not on file   Highest education level: Bachelor's degree (e.g., BA, AB, BS)  Occupational History   Not on file  Tobacco Use   Smoking status: Never   Smokeless tobacco: Never  Vaping Use   Vaping status: Never Used  Substance and Sexual Activity   Alcohol use: Never   Drug use: Never   Sexual activity: Not Currently    Birth control/protection: Post-menopausal  Other Topics Concern   Not on file  Social History Narrative   Not on file   Social Determinants of Health   Financial Resource Strain: Low Risk  (02/27/2023)   Overall Financial Resource Strain (CARDIA)  Difficulty of Paying Living Expenses: Not hard at all  Food Insecurity: No Food Insecurity (02/27/2023)   Hunger Vital Sign    Worried About Running Out of Food in the Last Year: Never true    Ran Out of Food in the Last Year: Never true  Transportation Needs: No Transportation Needs (02/27/2023)   PRAPARE - Administrator, Civil Service (Medical): No    Lack of Transportation (Non-Medical): No  Physical Activity: Unknown (02/27/2023)   Exercise Vital Sign    Days of Exercise per Week: 0 days    Minutes of Exercise per Session: Not on file  Stress: No Stress Concern Present (02/27/2023)   [employer] of Occupational Health - Occupational Stress Questionnaire    Feeling of Stress : Not at all  Social Connections: Socially Integrated (02/27/2023)   Social Connection and Isolation Panel [NHANES]    Frequency of Communication with Friends and Family: Twice a week    Frequency of Social Gatherings with Friends and Family: More than  three times a week    Attends Religious Services: More than 4 times per year    Active Member of [employer] or Organizations: Yes    Attends Banker Meetings: 1 to 4 times per year    Marital Status: Married  Catering manager Violence: Not At Risk (10/21/2022)   Humiliation, Afraid, Rape, and Kick questionnaire    Fear of Current or Ex-Partner: No    Emotionally Abused: No    Physically Abused: No    Sexually Abused: No    Medications:   Current Outpatient Medications on File Prior to Visit  Medication Sig Dispense Refill   acetaminophen (TYLENOL) 325 MG tablet Take 2 tablets (650 mg total) by mouth every 4 (four) hours as needed for mild pain (or temp > 37.5 C (99.5 F)).     apixaban (ELIQUIS) 5 MG TABS tablet Take 1 tablet (5 mg total) by mouth 2 (two) times daily. 60 tablet    cholestyramine (QUESTRAN) 4 g packet Take 1 packet (4 g total) by mouth daily at 6 PM. 90 packet 3   cinacalcet (SENSIPAR) 30 MG tablet Take 1 tablet (30 mg total) by mouth daily with breakfast. 30 tablet 0   ergocalciferol (VITAMIN D2) 1.25 MG (50000 UT) capsule Take 50,000 Units by mouth every 14 (fourteen) days. Every other saturday     furosemide (LASIX) 20 MG tablet Take 3 tablets (60 mg total) by mouth daily. Take an extra 20 mg as needed for swelling 270 tablet 3   JARDIANCE 10 MG TABS tablet TAKE ONE TABLET BY MOUTH ONE TIME DAILY 30 tablet 3   magnesium oxide (MAG-OX) 400 (240 Mg) MG tablet Take 1 tablet (400 mg total) by mouth daily. 90 tablet 3   Metoprolol Tartrate (LOPRESSOR) 50 MG tablet Take 1.5 tablets (75 mg total) by mouth 2 (two) times daily. 45 tablet 0   omeprazole (PRILOSEC) 40 MG capsule Take 1 capsule (40 mg total) by mouth 2 (two) times daily before lunch and supper. (Patient taking differently: Take 40 mg by mouth in the morning and at bedtime.) 180 capsule 1   ondansetron (ZOFRAN) 4 MG tablet Take 1 tablet (4 mg total) by mouth every 8 (eight) hours as needed for nausea or  vomiting. 20 tablet 0   oxybutynin (DITROPAN XL) 15 MG 24 hr tablet Take 1 tablet (15 mg total) by mouth at bedtime. (Patient taking differently: Take 15 mg by mouth at bedtime.) 90 tablet  3   potassium chloride (KLOR-CON M) 20 MEQ tablet Take 1 tablet (20 mEq total) by mouth daily. Use when taking more than 20 mg of furosemide daily 90 tablet 1   ticagrelor (BRILINTA) 90 MG TABS tablet Take 0.5 tablets (45 mg total) by mouth 2 (two) times daily. 60 tablet 2   No current facility-administered medications on file prior to visit.    Allergies:   Allergies  Allergen Reactions   Bee Venom Swelling    Bee stung her on the inside of her gum. Lips and gums started swelling. No SOB.   Levaquin [Levofloxacin] Hives and Itching   Peanut-Containing Drug Products Other (See Comments)    Stomach pain and diarrhea     Physical Exam General: Pleasant mildly obese elderly Caucasian lady seated, in no evident distress Head: head normocephalic and atraumatic.   Neck: supple with no carotid or supraclavicular bruits Cardiovascular: regular rate and rhythm, no murmurs Musculoskeletal: no deformity Skin:  no rash/petichiae Vascular:  Normal pulses all extremities  Neurologic Exam Mental Status: Awake and fully alert. Oriented to place and time. Recent and remote memory intact. Attention span, concentration and fund of knowledge appropriate. Mood and affect appropriate.  Speech mostly fluent with occasional word finding difficulties and hesitation.  Good comprehension, naming and repetition.  Diminished recall 0/3.  Able to name only 5 animals which can walk on 4 legs.  Mini-Mental status exam score 25/30.  Clock drawing 3/4. Cranial Nerves: Fundoscopic exam reveals sharp disc margins. Pupils equal, briskly reactive to light. Extraocular movements full without nystagmus. Visual fields shows dense right homonymous hemianopsia to confrontation. Hearing intact. Facial sensation intact. Face, tongue, palate moves  normally and symmetrically.  Motor: Mild weakness of right grip and intrinsic hand muscles.  Orbits left or right upper extremity.  Mild right lower extremity drift and ankle hip flexor and dorsiflexor weakness. Sensory.: intact to touch , pinprick , position and vibratory sensation.  Coordination: Rapid alternating movements normal in all extremities. Finger-to-nose and heel-to-shin performed accurately bilaterally. Gait and Station: Arises from chair without difficulty. Stance is normal. Gait demonstrates normal stride length and balance . Able to heel, toe and tandem walk without difficulty.  Reflexes: 1+ and symmetric. Toes downgoing.   NIHSS  4 Modified Rankin  3     05/15/2023   10:25 AM  MMSE - Mini Mental State Exam  Orientation to time 5  Orientation to Place 5  Registration 3  Attention/ Calculation 5  Recall 0  Language- name 2 objects 2  Language- repeat 1  Language- follow 3 step command 2  Language- read & follow direction 0  Write a sentence 1  Copy design 1  Total score 25    ASSESSMENT: 87 year old Caucasian lady with left MCA infarct and April 2024 secondary to left ICA occlusion with embolization to the left MCA s/p mechanical thrombectomy and revascularization of left MCA with rescue left ICA stenting.  This occurred when patient was off anticoagulation due to elective surgery.  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     PLAN:I had a long d/w patient, her daughter about her recent stroke, carotid occlusion and resultant mild aphasia and right sided weakness and mild cognitive impairment and memory loss, risk for recurrent stroke/TIAs, personally independently reviewed imaging studies and stroke evaluation results and answered questions.Continue Eliquis (apixaban) daily  for A-fib and Brilinta for carotid stent follow-up secondary stroke prevention and maintain strict control  of hypertension  with blood pressure goal below 130/90, diabetes with hemoglobin A1c goal below 6.5% and lipids with LDL cholesterol goal below 70 mg/dL. I also advised the patient to eat a healthy diet with plenty of whole grains, cereals, fruits and vegetables, exercise regularly and maintain ideal body weight .I encouraged her to use her walker while walking outdoors and long distances at all times and we discussed fall safety precautions.  I also encouraged her to increase participation in cognitively challenging activities like solving crossword puzzles, playing bridge and sudoku.  We discussed memory compensation strategies as well.  Check memory panel labs and EEG.  Followup in the future with me in 3 to 4 months or call earlier if necessary. Greater than 50% time during this 50-minute visit was spent on counseling and coordination of care about her left MCA infarct, carotid occlusion, revascularization and stenting, poststroke memory loss and mild cognitive impairment and atrial fibrillation and discussion about risk-benefit of anticoagulation and answering questions [name], MD Note: This document was prepared with digital dictation and possible smart phrase technology. Any transcriptional errors that result from this process are unintentional.

## 2023-05-15 NOTE — Patient Instructions (Signed)
I had a long d/w patient, her daughter about her recent stroke, carotid occlusion and resultant mild aphasia and right sided weakness and mild cognitive impairment and memory loss, risk for recurrent stroke/TIAs, personally independently reviewed imaging studies and stroke evaluation results and answered questions.Continue Eliquis (apixaban) daily  for A-fib and Brilinta for carotid stent follow-up secondary stroke prevention and maintain strict control of hypertension with blood pressure goal below 130/90, diabetes with hemoglobin A1c goal below 6.5% and lipids with LDL cholesterol goal below 70 mg/dL. I also advised the patient to eat a healthy diet with plenty of whole grains, cereals, fruits and vegetables, exercise regularly and maintain ideal body weight .I encouraged her to use her walker while walking outdoors and long distances at all times and we discussed fall safety precautions.  I also encouraged her to increase participation in cognitively challenging activities like solving crossword puzzles, playing bridge and sudoku.  We discussed memory compensation strategies as well.  Check memory panel labs and EEG.  Followup in the future with me in 3 to 4 months or call earlier if necessary.  Memory Compensation Strategies  Use "WARM" strategy.  W= write it down  A= associate it  R= repeat it  M= make a mental note  2.   You can keep a Glass blower/designer.  Use a 3-ring notebook with sections for the following: calendar, important names and phone numbers,  medications, doctors' names/phone numbers, lists/reminders, and a section to journal what you did  each day.   3.    Use a calendar to write appointments down.  4.    Write yourself a schedule for the day.  This can be placed on the calendar or in a separate section of the Memory Notebook.  Keeping a  regular schedule can help memory.  5.    Use medication organizer with sections for each day or morning/evening pills.  You may need help  loading it  6.    Keep a basket, or pegboard by the door.  Place items that you need to take out with you in the basket or on the pegboard.  You may also want to  include a message board for reminders.  7.    Use sticky notes.  Place sticky notes with reminders in a place where the task is performed.  For example: " turn off the  stove" placed by the stove, "lock the door" placed on the door at eye level, " take your medications" on  the bathroom mirror or by the place where you normally take your medications.  8.    Use alarms/timers.  Use while cooking to remind yourself to check on food or as a reminder to take your medicine, or as a  reminder to make a call, or as a reminder to perform another task, etc.

## 2023-05-16 ENCOUNTER — Encounter: Payer: Self-pay | Admitting: Neurology

## 2023-05-20 NOTE — Progress Notes (Signed)
Kindly inform the patient that lab work for reversible causes of memory loss was all satisfactory

## 2023-05-21 ENCOUNTER — Other Ambulatory Visit: Payer: Self-pay | Admitting: Family Medicine

## 2023-05-22 ENCOUNTER — Ambulatory Visit: Payer: Medicare HMO | Admitting: Neurology

## 2023-05-22 DIAGNOSIS — R413 Other amnesia: Secondary | ICD-10-CM

## 2023-05-22 DIAGNOSIS — R4182 Altered mental status, unspecified: Secondary | ICD-10-CM

## 2023-05-22 DIAGNOSIS — G3184 Mild cognitive impairment, so stated: Secondary | ICD-10-CM

## 2023-05-23 ENCOUNTER — Other Ambulatory Visit: Payer: Self-pay

## 2023-05-23 NOTE — Patient Outreach (Signed)
No Telephone outreach need. Dr. Pearlean Brownie obtained mRS was successfully 05/15/23. MRS= 3  Vanice Sarah [facility] Care Management Assistant [phone]

## 2023-05-24 ENCOUNTER — Other Ambulatory Visit: Payer: Self-pay | Admitting: Family Medicine

## 2023-05-24 ENCOUNTER — Encounter: Payer: Self-pay | Admitting: Family Medicine

## 2023-05-24 MED ORDER — OXYBUTYNIN CHLORIDE ER 15 MG PO TB24: 15.0000 mg | ORAL_TABLET | Freq: Every day | ORAL | 3 refills | Status: DC

## 2023-06-04 NOTE — Progress Notes (Signed)
HPI: FU atrial fibrillation and congestive heart failure.  Patient previously followed in [facility].  Echocardiogram January 2024 showed normal LV function, mild right ventricular enlargement, moderate pulmonary hypertension, moderate left atrial enlargement, mild right atrial enlargement, mild mitral regurgitation, mild to moderate tricuspid regurgitation.  Patient was admitted January 2024 with CHF.  Patient was in atrial fibrillation at that time and placed on Cardizem and metoprolol increased.  Had elective cardioversion January 01, 2023.  Patient did pause her Eliquis for bladder surgery on April 29.  Patient developed difficulties with amaurosis fugax and seen February 06, 2023.  CTA of the head and neck showed occlusion of the left internal carotid artery just distal to its takeoff felt to be acute to subacute.  Patient had angiogram and revascularization by interventional radiology including carotid stent.  Follow-up MRI showed large posterior left MCA infarct, small left cerebellum, left occipital lobe infarcts and subarachnoid hemorrhage in the left sylvian fissure and over the posterior left frontal lobe.  Since last seen she denies dyspnea, chest pain, palpitations or syncope.  Current Outpatient Medications  Medication Sig Dispense Refill   acetaminophen (TYLENOL) 325 MG tablet Take 2 tablets (650 mg total) by mouth every 4 (four) hours as needed for mild pain (or temp > 37.5 C (99.5 F)).     cholestyramine (QUESTRAN) 4 g packet Take 1 packet (4 g total) by mouth daily at 6 PM. 90 packet 3   cinacalcet (SENSIPAR) 30 MG tablet Take 1 tablet (30 mg total) by mouth daily with breakfast. 30 tablet 0   ELIQUIS 5 MG TABS tablet TAKE ONE TABLET BY MOUTH TWICE A DAY 60 tablet 3   ergocalciferol (VITAMIN D2) 1.25 MG (50000 UT) capsule Take 50,000 Units by mouth every 14 (fourteen) days. Every other saturday     furosemide (LASIX) 20 MG tablet Take 3 tablets (60 mg total) by mouth daily. Take an  extra 20 mg as needed for swelling 270 tablet 3   JARDIANCE 10 MG TABS tablet TAKE ONE TABLET BY MOUTH ONE TIME DAILY 30 tablet 3   magnesium oxide (MAG-OX) 400 (240 Mg) MG tablet Take 1 tablet (400 mg total) by mouth daily. 90 tablet 3   Metoprolol Tartrate (LOPRESSOR) 50 MG tablet Take 1.5 tablets (75 mg total) by mouth 2 (two) times daily. 45 tablet 0   omeprazole (PRILOSEC) 40 MG capsule Take 1 capsule (40 mg total) by mouth 2 (two) times daily before lunch and supper. (Patient taking differently: Take 40 mg by mouth in the morning and at bedtime.) 180 capsule 1   ondansetron (ZOFRAN) 4 MG tablet Take 1 tablet (4 mg total) by mouth every 8 (eight) hours as needed for nausea or vomiting. 20 tablet 0   oxybutynin (DITROPAN XL) 15 MG 24 hr tablet Take 1 tablet (15 mg total) by mouth at bedtime. 90 tablet 3   potassium chloride (KLOR-CON M) 20 MEQ tablet Take 1 tablet (20 mEq total) by mouth daily. Use when taking more than 20 mg of furosemide daily 90 tablet 1   ticagrelor (BRILINTA) 90 MG TABS tablet Take 0.5 tablets (45 mg total) by mouth 2 (two) times daily. 60 tablet 2   No current facility-administered medications for this visit.     Past Medical History:  Diagnosis Date   Anemia    Bladder cancer [facility])    urologist--- dr pace   Chronic diastolic (congestive) heart failure (HCC)    followed by dr Jens Som  Chronic venous insufficiency    Edema of both lower extremities    per pt wears compression hose   GERD (gastroesophageal reflux disease)    History of cancer chemotherapy    completed 2008 for left breast cancer   History of head and neck radiation 1946   per pt approx 1946 or 1947  (age 63) due to profound bilateral hearing loss told from scarlett fever/ measles,  once weekly for several weeks had  head/ neck radiation,  hearing was restored without neededing hearing aids   History of left breast cancer 2008   malignant neoplasm of overlapping sites of left breast, ER+;   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 patel;   2006  s/p left thyroidectomy w/ right inferior parathyoidectomy  (per path speciman marked parathyroid but only thyroid tissue)   Hypertension    IDA (iron deficiency anemia)    hematology/ oncologist--- dr ennever/ sarah carter NP;  treated w/ IV iron infusions   Multinodular thyroid    Neuropathy    mild hands/ feet, uses cane   Nocturia more than twice per night    Nocturnal leg cramps    OA (osteoarthritis)    knees   OAB (overactive bladder)    Osteoporosis    PAF (paroxysmal atrial fibrillation) [facility])    cardiologist--- dr Jens Som   PONV (postoperative nausea and vomiting)    Vitamin D deficiency    Wears glasses     Past Surgical History:  Procedure Laterality Date   BREAST LUMPECTOMY WITH RADIOACTIVE SEED LOCALIZATION Right 10/25/2021   Procedure: RIGHT BREAST LUMPECTOMY WITH RADIOACTIVE SEED LOCALIZATION;  Surgeon: Harriette Bouillon, MD;  Location: [facility];  Service: General;  Laterality: Right;   CARDIOVERSION N/A 01/01/2023   Procedure: CARDIOVERSION;  Surgeon: [name], MD;  Location: [facility];  Service: Cardiovascular;  Laterality: N/A;   CHOLECYSTECTOMY, LAPAROSCOPIC  02/25/2017   @[facility]    COLONOSCOPY WITH ESOPHAGOGASTRODUODENOSCOPY (EGD)  2022   IR ANGIO INTRA EXTRACRAN SEL COM CAROTID INNOMINATE UNI R MOD SED  02/07/2023   IR ANGIO VERTEBRAL SEL SUBCLAVIAN INNOMINATE UNI L MOD SED  02/07/2023   IR CT HEAD LTD  02/07/2023   IR INTRAVSC STENT CERV CAROTID W/O EMB-PROT MOD SED INC ANGIO  02/07/2023   IR PERCUTANEOUS ART THROMBECTOMY/INFUSION INTRACRANIAL INC DIAG ANGIO  02/07/2023   IR RADIOLOGIST EVAL & MGMT  03/10/2023   MASTECTOMY WITH AXILLARY LYMPH NODE DISSECTION Left 03/05/2007   @ [facility]   OVARIAN CYST SURGERY     age 89;   abdominal   RADIOLOGY WITH  ANESTHESIA N/A 02/06/2023   Procedure: IR WITH ANESTHESIA;  Surgeon: Radiologist, Medication, MD;  Location: [facility] OR;  Service: Radiology;  Laterality: N/A;   THYROIDECTOMY, PARTIAL Left 2006   left lobectomy and right infertior parathyroidectomy   TONSILLECTOMY     child    Social History   Socioeconomic History   Marital status: Married    Spouse name: Not on file   Number of children: Not on file   Years of education: Not on file   Highest education level: Bachelor's degree (e.g., BA, AB, BS)  Occupational History   Not on file  Tobacco Use   Smoking status: Never   Smokeless tobacco: Never  Vaping Use   Vaping status: Never Used  Substance and Sexual Activity   Alcohol  use: Never   Drug use: Never   Sexual activity: Not Currently    Birth control/protection: Post-menopausal  Other Topics Concern   Not on file  Social History Narrative   Not on file   Social Determinants of Health   Financial Resource Strain: Low Risk  (02/27/2023)   Overall Financial Resource Strain (CARDIA)    Difficulty of Paying Living Expenses: Not hard at all  Food Insecurity: No Food Insecurity (02/27/2023)   Hunger Vital Sign    Worried About Running Out of Food in the Last Year: Never true    Ran Out of Food in the Last Year: Never true  Transportation Needs: No Transportation Needs (02/27/2023)   PRAPARE - Administrator, Civil Service (Medical): No    Lack of Transportation (Non-Medical): No  Physical Activity: Unknown (02/27/2023)   Exercise Vital Sign    Days of Exercise per Week: 0 days    Minutes of Exercise per Session: Not on file  Stress: No Stress Concern Present (02/27/2023)   [employer] of Occupational Health - Occupational Stress Questionnaire    Feeling of Stress : Not at all  Social Connections: Socially Integrated (02/27/2023)   Social Connection and Isolation Panel [NHANES]    Frequency of Communication with Friends and Family: Twice a week    Frequency of  Social Gatherings with Friends and Family: More than three times a week    Attends Religious Services: More than 4 times per year    Active Member of [employer] or Organizations: Yes    Attends Banker Meetings: 1 to 4 times per year    Marital Status: Married  Catering manager Violence: Not At Risk (10/21/2022)   Humiliation, Afraid, Rape, and Kick questionnaire    Fear of Current or Ex-Partner: No    Emotionally Abused: No    Physically Abused: No    Sexually Abused: No    Family History  Problem Relation Age of Onset   Sudden death Neg Hx     ROS: no fevers or chills, productive cough, hemoptysis, dysphasia, odynophagia, melena, hematochezia, dysuria, hematuria, rash, seizure activity, orthopnea, PND, pedal edema, claudication. Remaining systems are negative.  Physical Exam: Well-developed well-nourished in no acute distress.  Skin is warm and dry.  HEENT is normal.  Neck is supple.  Chest is clear to auscultation with normal expansion.  Cardiovascular exam is irregular Abdominal exam nontender or distended. No masses palpated. Extremities show no edema. neuro grossly intact   A/P  1 permanent atrial fibrillation-plan is rate control and anticoagulation.  Continue metoprolol and apixaban.  2 chronic diastolic congestive heart failure-patient is euvolemic on examination today.  Will continue Lasix at present dose.  3 hypertension-blood pressure controlled.  Continue present medical regimen.  4 prior CVA-patient is status post stenting of her left internal carotid artery.  Managed by neurology.  Olga Millers, MD

## 2023-06-11 NOTE — Progress Notes (Signed)
Kindly inform the patient that EEG of brainwave study shows slight slowing of the brainwave activity on the left side which is likely the result of her previous stroke.  No seizure activity noted.

## 2023-06-12 ENCOUNTER — Telehealth: Payer: Self-pay | Admitting: Neurology

## 2023-06-12 NOTE — Telephone Encounter (Signed)
-----   Message from [name] sent at 06/11/2023  8:28 PM EDT ----- Loretta Gates inform the patient that EEG of brainwave study shows slight slowing of the brainwave activity on the left side which is likely the result of her previous stroke.  No seizure activity noted.

## 2023-06-12 NOTE — Telephone Encounter (Signed)
Called the patient and I was able to review the EEG results with her. Informed her there was no evidence of seizure like activity. Advised there was slowing of the brain waves in the area where she had old stroke.

## 2023-06-13 ENCOUNTER — Ambulatory Visit: Payer: Medicare HMO | Attending: Cardiology | Admitting: Cardiology

## 2023-06-13 ENCOUNTER — Encounter: Payer: Self-pay | Admitting: Cardiology

## 2023-06-13 VITALS — BP 144/88 | HR 76 | Ht 58.5 in | Wt 171.8 lb

## 2023-06-13 DIAGNOSIS — I482 Chronic atrial fibrillation, unspecified: Secondary | ICD-10-CM | POA: Diagnosis not present

## 2023-06-13 DIAGNOSIS — I5030 Unspecified diastolic (congestive) heart failure: Secondary | ICD-10-CM

## 2023-06-13 DIAGNOSIS — I10 Essential (primary) hypertension: Secondary | ICD-10-CM | POA: Diagnosis not present

## 2023-06-13 NOTE — Patient Instructions (Signed)
Medication Instructions:   Your physician recommends that you continue on your current medications as directed. Please refer to the Current Medication list given to you today.   *If you need a refill on your cardiac medications before your next appointment, please call your pharmacy*   Lab Work:  None ordered.  If you have labs (blood work) drawn today and your tests are completely normal, you will receive your results only by: MyChart Message (if you have MyChart) OR A paper copy in the mail If you have any lab test that is abnormal or we need to change your treatment, we will call you to review the results.   Testing/Procedures:  None ordered.   Follow-Up: At [facility], you and your health needs are our priority.  As part of our continuing mission to provide you with exceptional heart care, we have created designated Provider Care Teams.  These Care Teams include your primary Cardiologist (physician) and Advanced Practice Providers (APPs -  Physician Assistants and Nurse Practitioners) who all work together to provide you with the care you need, when you need it.  We recommend signing up for the patient portal called "MyChart".  Sign up information is provided on this After Visit Summary.  MyChart is used to connect with patients for Virtual Visits (Telemedicine).  Patients are able to view lab/test results, encounter notes, upcoming appointments, etc.  Non-urgent messages can be sent to your provider as well.   To learn more about what you can do with MyChart, go to [email or web address].    Your next appointment:   6 month(s)  Provider:   Olga Millers, MD

## 2023-06-30 ENCOUNTER — Other Ambulatory Visit: Payer: Self-pay | Admitting: Family Medicine

## 2023-07-09 ENCOUNTER — Other Ambulatory Visit: Payer: Self-pay | Admitting: Family Medicine

## 2023-07-09 MED ORDER — METOPROLOL TARTRATE 50 MG PO TABS: 75.0000 mg | ORAL_TABLET | Freq: Two times a day (BID) | ORAL | 0 refills | Status: DC

## 2023-07-10 ENCOUNTER — Other Ambulatory Visit: Payer: Self-pay

## 2023-07-15 ENCOUNTER — Other Ambulatory Visit: Payer: Self-pay | Admitting: Family Medicine

## 2023-07-27 ENCOUNTER — Other Ambulatory Visit: Payer: Self-pay | Admitting: Family Medicine

## 2023-07-31 NOTE — Patient Instructions (Incomplete)
It was good to see you today, I will be in touch with your labs  Assuming all is well please see me in 4 to 6 months  Keep up the good work with walking and doing social activities   Let's try having you take the oxybutynin 10 mg twice a day and see if this works any better for your bladder - let me know how this works for you  You might get the updated shingles vaccine- Shingrix- at your pharmacy at your convenience

## 2023-07-31 NOTE — Progress Notes (Unsigned)
[facility] [address] [place], [place] [address] [phone]  Date:  08/01/2023   Name:  Loretta Gates   DOB:  Feb 03, 1936   MRN:  [id number]  PCP:  Pearline Cables, MD    Chief Complaint: No chief complaint on file.   History of Present Illness:  Loretta Gates is a 87 y.o. very pleasant female patient who presents with the following:  Patient seen today for 73-month follow-up Most recent visit with myself was in July History of heart failure, atrial fibrillation on Eliquis and metoprolol She was admitted with a stroke in April of this year-stroke likely occurred when her blood thinner was temporarily paused for a bladder procedure  She saw her cardiologist, Dr. Jens Som about 6 weeks ago developed atrial fibrillation and CHF: 1 permanent atrial fibrillation-plan is rate control and anticoagulation.  Continue metoprolol and apixaban. 2 chronic diastolic congestive heart failure-patient is euvolemic on examination today.  Will continue Lasix at present dose. 3 hypertension-blood pressure controlled.  Continue present medical regimen. 4 prior CVA-patient is status post stenting of her left internal carotid artery.  Managed by neurology.  She was also seen by neurology since her last visit, visit with Dr.Sethi in August: PLAN:I had a long d/w patient, her daughter about her recent stroke, carotid occlusion and resultant mild aphasia and right sided weakness and mild cognitive impairment and memory loss, risk for recurrent stroke/TIAs, personally independently reviewed imaging studies and stroke evaluation results and answered questions.Continue Eliquis (apixaban) daily  for A-fib and Brilinta for carotid stent follow-up secondary stroke prevention and maintain strict control of hypertension with blood pressure goal below 130/90, diabetes with hemoglobin A1c goal below 6.5% and lipids with LDL cholesterol goal below 70  mg/dL.   She had an EEG in September which was normal/negative for seizure activity  Shingles vaccine COVID booster Flu shot Lab Results  Component Value Date   HGBA1C 6.2 (H) 02/07/2023   Recent labs on chart-BMP done in July Cholestyramine daily Eliquis 5 twice daily Brilinta Jardiance Lasix 60 mg daily/potassium 20 mill equivalents daily Magnesium Metoprolol Prilosec Oxybutynin 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   ICAO (internal carotid artery occlusion), left 02/06/2023   Chest pain 12/08/2022   Class 3 obesity 10/21/2022   Acute on chronic diastolic CHF (congestive heart failure) (HCC) 10/20/2022   IDA (iron deficiency anemia) 01/13/2022   Atrial fibrillation (HCC) 01/05/2022   Breast cancer (HCC) 01/05/2022   Primary hyperparathyroidism (HCC) 01/05/2022   Essential hypertension 01/05/2022   Age-related nuclear cataract of both eyes 12/11/2018   Abnormal INR 01/29/2018   Long term (current) use of anticoagulants 01/29/2018   Encounter for therapeutic drug monitoring 01/29/2018   Vitamin D deficiency 05/03/2017   S/P laparoscopic cholecystectomy 02/26/2017   Calculus of gallbladder without cholecystitis without obstruction 02/23/2017   Diarrhea of presumed infectious origin 02/23/2017   Hyperglycemia 02/23/2017   Right upper quadrant abdominal pain 02/23/2017   Lower extremity edema 01/18/2016   Age-related osteoporosis without current pathological fracture 01/17/2016   Diverticulosis of large intestine without hemorrhage 01/17/2016   GERD (gastroesophageal reflux disease) 01/17/2016   History of left breast cancer 01/17/2016   Multinodular goiter 01/17/2016   OAB (overactive bladder) 01/17/2016   History of cancer chemotherapy 03/06/2015   History of  left mastectomy 03/06/2015   Malignant neoplasm of  overlapping sites of left female breast (HCC) 03/06/2015    Past Medical History:  Diagnosis Date   Anemia    Bladder cancer [facility])    urologist--- dr pace   Chronic diastolic (congestive) heart failure (HCC)    followed by dr Jens Som   Chronic venous insufficiency    Edema of both lower extremities    per pt wears compression hose   GERD (gastroesophageal reflux disease)    History of cancer chemotherapy    completed 2008 for left breast cancer   History of head and neck radiation 1946   per pt approx 1946 or 1947  (age 29) due to profound bilateral hearing loss told from scarlett fever/ measles,  once weekly for several weeks had  head/ neck radiation,  hearing was restored without neededing hearing aids   History of left breast cancer 2008   malignant neoplasm of overlapping sites of left breast, ER+;  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 patel;   2006  s/p left thyroidectomy w/ right inferior parathyoidectomy  (per path speciman marked parathyroid but only thyroid tissue)   Hypertension    IDA (iron deficiency anemia)    hematology/ oncologist--- dr ennever/ sarah carter NP;  treated w/ IV iron infusions   Multinodular thyroid    Neuropathy    mild hands/ feet, uses cane   Nocturia more than twice per night    Nocturnal leg cramps    OA (osteoarthritis)    knees   OAB (overactive bladder)    Osteoporosis    PAF (paroxysmal atrial fibrillation) [facility])    cardiologist--- dr Jens Som   PONV (postoperative nausea and vomiting)    Vitamin D deficiency    Wears glasses     Past Surgical History:  Procedure Laterality Date   BREAST LUMPECTOMY WITH RADIOACTIVE SEED LOCALIZATION Right 10/25/2021   Procedure: RIGHT BREAST LUMPECTOMY WITH RADIOACTIVE SEED LOCALIZATION;  Surgeon: Harriette Bouillon, MD;  Location:  SURGERY CENTER;   Service: General;  Laterality: Right;   CARDIOVERSION N/A 01/01/2023   Procedure: CARDIOVERSION;  Surgeon: [name], MD;  Location: [facility] ENDOSCOPY;  Service: Cardiovascular;  Laterality: N/A;   CHOLECYSTECTOMY, LAPAROSCOPIC  02/25/2017   @[facility]    COLONOSCOPY WITH ESOPHAGOGASTRODUODENOSCOPY (EGD)  2022   IR ANGIO INTRA EXTRACRAN SEL COM CAROTID INNOMINATE UNI R MOD SED  02/07/2023   IR ANGIO VERTEBRAL SEL SUBCLAVIAN INNOMINATE UNI L MOD SED  02/07/2023   IR CT HEAD LTD  02/07/2023   IR INTRAVSC STENT CERV CAROTID W/O EMB-PROT MOD SED INC ANGIO  02/07/2023   IR PERCUTANEOUS ART THROMBECTOMY/INFUSION INTRACRANIAL INC DIAG ANGIO  02/07/2023   IR RADIOLOGIST EVAL & MGMT  03/10/2023   MASTECTOMY WITH AXILLARY LYMPH NODE DISSECTION Left 03/05/2007   @ [facility]   OVARIAN CYST SURGERY     age 76;   abdominal   RADIOLOGY WITH ANESTHESIA N/A 02/06/2023   Procedure: IR WITH ANESTHESIA;  Surgeon: Radiologist, Medication, MD;  Location: [facility] OR;  Service: Radiology;  Laterality: N/A;   THYROIDECTOMY, PARTIAL Left 2006   left lobectomy and right infertior parathyroidectomy   TONSILLECTOMY     child    Social History   Tobacco Use   Smoking status: Never   Smokeless tobacco: Never  Vaping Use   Vaping status: Never Used  Substance  Use Topics   Alcohol use: Never   Drug use: Never    Family History  Problem Relation Age of Onset   Sudden death Neg Hx     Allergies  Allergen Reactions   Bee Venom Swelling    Bee stung her on the inside of her gum. Lips and gums started swelling. No SOB.   Levaquin [Levofloxacin] Hives and Itching   Peanut-Containing Drug Products Other (See Comments)    Stomach pain and diarrhea     Medication list has been reviewed and updated.  Current Outpatient Medications on File Prior to Visit  Medication Sig Dispense Refill   acetaminophen (TYLENOL) 325 MG tablet Take 2 tablets (650 mg total) by mouth every 4 (four) hours as needed for mild pain (or temp > 37.5 C (99.5  F)).     cholestyramine (QUESTRAN) 4 g packet Take 1 packet (4 g total) by mouth daily at 6 PM. 90 packet 3   cinacalcet (SENSIPAR) 30 MG tablet Take 1 tablet (30 mg total) by mouth daily with breakfast. 30 tablet 0   ELIQUIS 5 MG TABS tablet TAKE ONE TABLET BY MOUTH TWICE A DAY 60 tablet 3   empagliflozin (JARDIANCE) 10 MG TABS tablet Take 1 tablet (10 mg total) by mouth daily. 100 tablet 0   ergocalciferol (VITAMIN D2) 1.25 MG (50000 UT) capsule Take 50,000 Units by mouth every 14 (fourteen) days. Every other saturday     furosemide (LASIX) 20 MG tablet Take 3 tablets (60 mg total) by mouth daily. Take an extra 20 mg as needed for swelling 270 tablet 3   magnesium oxide (MAG-OX) 400 (240 Mg) MG tablet Take 1 tablet (400 mg total) by mouth daily. 90 tablet 3   metoprolol tartrate (LOPRESSOR) 50 MG tablet Take 1.5 tablets (75 mg total) by mouth 2 (two) times daily. 270 tablet 0   omeprazole (PRILOSEC) 40 MG capsule Take 1 capsule (40 mg total) by mouth 2 (two) times daily before a meal. 180 capsule 1   ondansetron (ZOFRAN) 4 MG tablet Take 1 tablet (4 mg total) by mouth every 8 (eight) hours as needed for nausea or vomiting. 20 tablet 0   oxybutynin (DITROPAN XL) 15 MG 24 hr tablet Take 1 tablet (15 mg total) by mouth at bedtime. 90 tablet 3   potassium chloride SA (KLOR-CON M) 20 MEQ tablet TAKE ONE TABLET BY MOUTH DAILY - USE WHEN TAKING MORE THAN 20 MG OF FUROSEMIDE DAILY 90 tablet 1   ticagrelor (BRILINTA) 90 MG TABS tablet Take 0.5 tablets (45 mg total) by mouth 2 (two) times daily. 60 tablet 2   No current facility-administered medications on file prior to visit.    Review of Systems:  As per HPI- otherwise negative.   Physical Examination: There were no vitals filed for this visit. There were no vitals filed for this visit. There is no height or weight on file to calculate BMI. Ideal Body Weight:    GEN: no acute distress. HEENT: Atraumatic, Normocephalic.  Ears and Nose: No  external deformity. CV: RRR, No M/G/R. No JVD. No thrill. No extra heart sounds. PULM: CTA B, no wheezes, crackles, rhonchi. No retractions. No resp. distress. No accessory muscle use. ABD: S, NT, ND, +BS. No rebound. No HSM. EXTR: No c/c/e PSYCH: Normally interactive. Conversant.    Assessment and Plan: ***  Signed Abbe Amsterdam, MD

## 2023-08-01 ENCOUNTER — Ambulatory Visit (INDEPENDENT_AMBULATORY_CARE_PROVIDER_SITE_OTHER): Payer: Medicare HMO | Admitting: Family Medicine

## 2023-08-01 ENCOUNTER — Encounter: Payer: Self-pay | Admitting: Family Medicine

## 2023-08-01 VITALS — BP 124/82 | HR 91 | Temp 98.1°F | Resp 18 | Ht <= 58 in | Wt 178.8 lb

## 2023-08-01 DIAGNOSIS — Z5181 Encounter for therapeutic drug level monitoring: Secondary | ICD-10-CM | POA: Diagnosis not present

## 2023-08-01 DIAGNOSIS — I5032 Chronic diastolic (congestive) heart failure: Secondary | ICD-10-CM

## 2023-08-01 DIAGNOSIS — N3281 Overactive bladder: Secondary | ICD-10-CM

## 2023-08-01 DIAGNOSIS — I4891 Unspecified atrial fibrillation: Secondary | ICD-10-CM

## 2023-08-01 DIAGNOSIS — I6522 Occlusion and stenosis of left carotid artery: Secondary | ICD-10-CM | POA: Diagnosis not present

## 2023-08-01 DIAGNOSIS — R7303 Prediabetes: Secondary | ICD-10-CM | POA: Diagnosis not present

## 2023-08-01 LAB — MAGNESIUM: Magnesium: 2 mg/dL (ref 1.5–2.5)

## 2023-08-01 LAB — COMPREHENSIVE METABOLIC PANEL
ALT: 17 U/L (ref 0–35)
AST: 16 U/L (ref 0–37)
Albumin: 4.3 g/dL (ref 3.5–5.2)
Alkaline Phosphatase: 57 U/L (ref 39–117)
BUN: 23 mg/dL (ref 6–23)
CO2: 31 meq/L (ref 19–32)
Calcium: 9.1 mg/dL (ref 8.4–10.5)
Chloride: 101 meq/L (ref 96–112)
Creatinine, Ser: 1.01 mg/dL (ref 0.40–1.20)
GFR: 50.83 mL/min — ABNORMAL LOW (ref >60.00)
Glucose, Bld: 87 mg/dL (ref 70–99)
Potassium: 3.7 meq/L (ref 3.5–5.1)
Sodium: 142 meq/L (ref 135–145)
Total Bilirubin: 0.9 mg/dL (ref 0.2–1.2)
Total Protein: 7 g/dL (ref 6.0–8.3)

## 2023-08-01 LAB — HEMOGLOBIN A1C: Hgb A1c MFr Bld: 6.3 % (ref 4.6–6.5)

## 2023-08-01 MED ORDER — OXYBUTYNIN CHLORIDE ER 10 MG PO TB24
10.0000 mg | ORAL_TABLET | Freq: Two times a day (BID) | ORAL | 3 refills | Status: DC
Start: 2023-08-01 — End: 2023-11-26

## 2023-08-08 DIAGNOSIS — E559 Vitamin D deficiency, unspecified: Secondary | ICD-10-CM | POA: Diagnosis not present

## 2023-08-08 DIAGNOSIS — E213 Hyperparathyroidism, unspecified: Secondary | ICD-10-CM | POA: Diagnosis not present

## 2023-08-08 DIAGNOSIS — M810 Age-related osteoporosis without current pathological fracture: Secondary | ICD-10-CM | POA: Diagnosis not present

## 2023-08-16 ENCOUNTER — Other Ambulatory Visit: Payer: Self-pay | Admitting: Family Medicine

## 2023-08-20 ENCOUNTER — Encounter: Payer: Self-pay | Admitting: Family Medicine

## 2023-08-20 DIAGNOSIS — M810 Age-related osteoporosis without current pathological fracture: Secondary | ICD-10-CM | POA: Diagnosis not present

## 2023-08-20 LAB — HM DEXA SCAN

## 2023-08-23 ENCOUNTER — Encounter: Payer: Self-pay | Admitting: Family Medicine

## 2023-08-24 MED ORDER — TICAGRELOR 90 MG PO TABS: 45.0000 mg | ORAL_TABLET | Freq: Two times a day (BID) | ORAL | 3 refills | Status: DC

## 2023-08-31 ENCOUNTER — Other Ambulatory Visit: Payer: Self-pay | Admitting: Family Medicine

## 2023-09-03 ENCOUNTER — Encounter: Payer: Self-pay | Admitting: Family Medicine

## 2023-09-03 NOTE — Telephone Encounter (Signed)
error 

## 2023-09-18 IMAGING — MG MM PLC BREAST LOC DEV 1ST LESION INC*R*
8 series · 8 of 8 positions shown · non-contrast
Comparison: Previous exam(s).

CLINICAL DATA: Patient is scheduled for RIGHT breast surgical
excision requiring preoperative radioactive seed localization.

EXAM:
MAMMOGRAPHIC GUIDED RADIOACTIVE SEED LOCALIZATION OF THE RIGHT
BREAST

[R CC (1 of 4)]
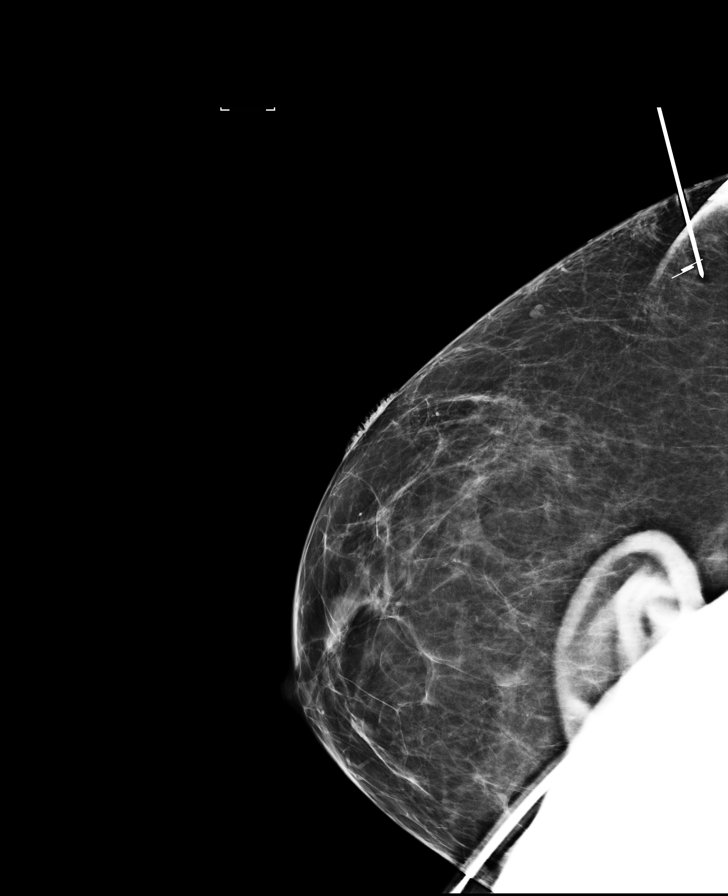

[R LM (1 of 4)]
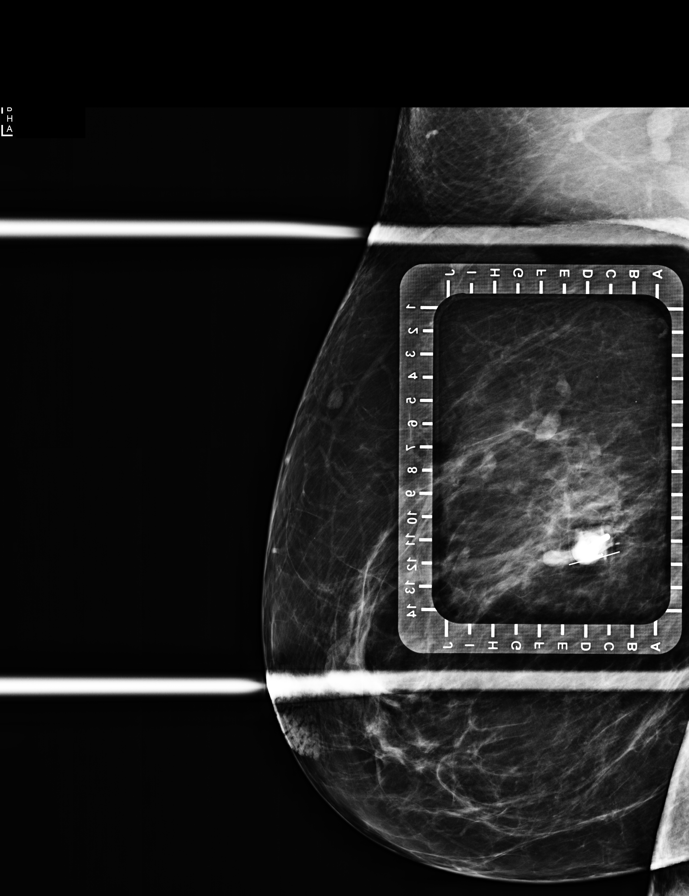

[R LM (2 of 4)]
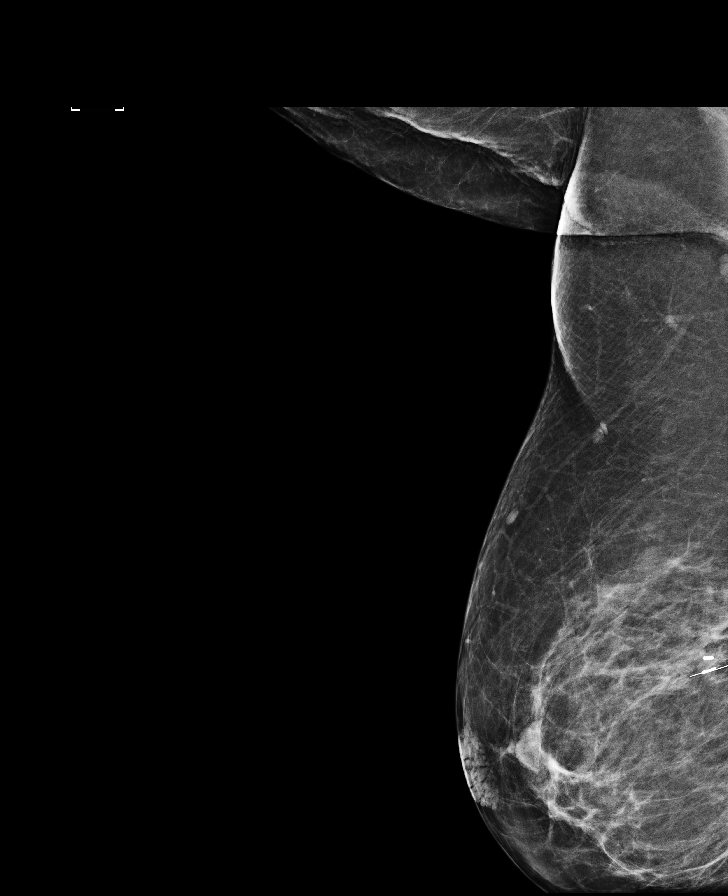

[R CC (2 of 4)]
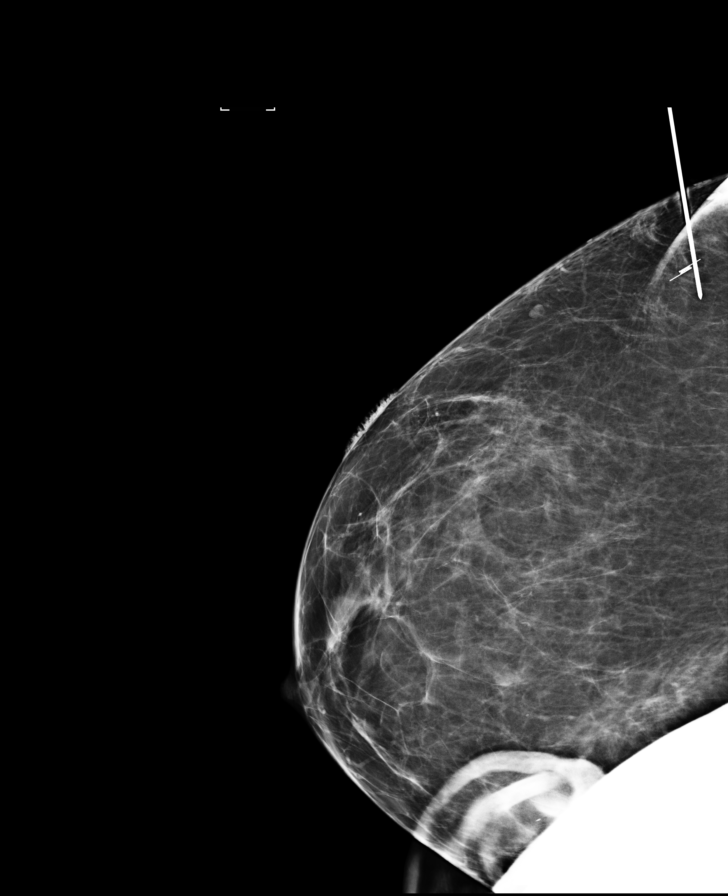

[R CC (3 of 4)]
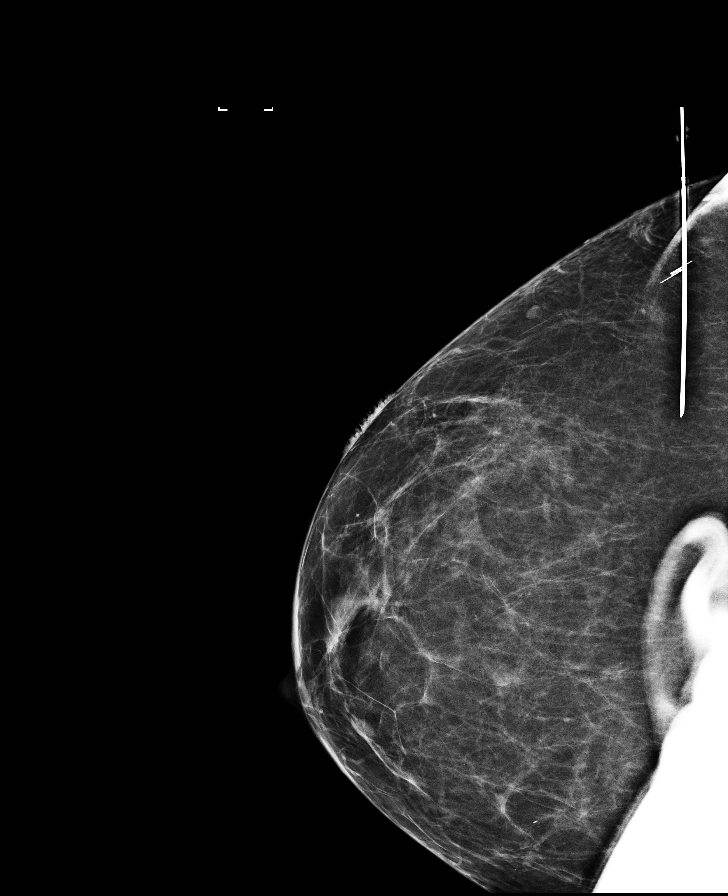

[R LM (3 of 4)]
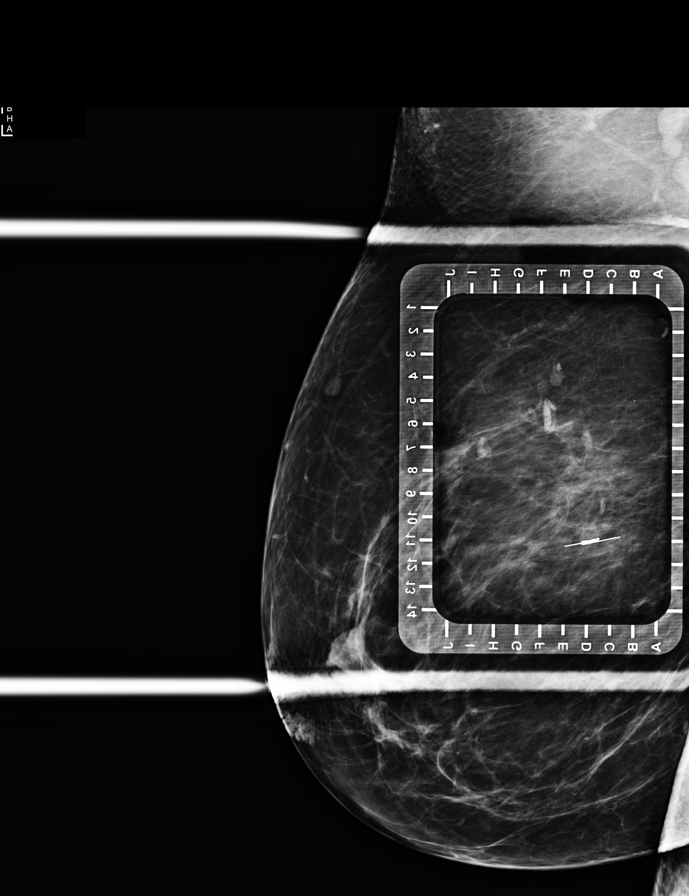

[R LM (4 of 4)]
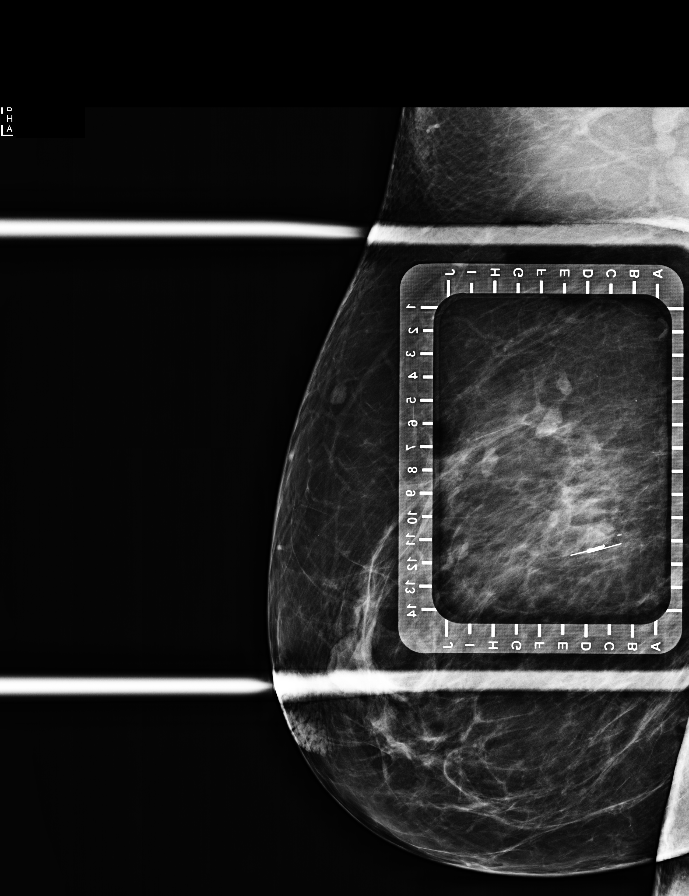

[R CC (4 of 4)]
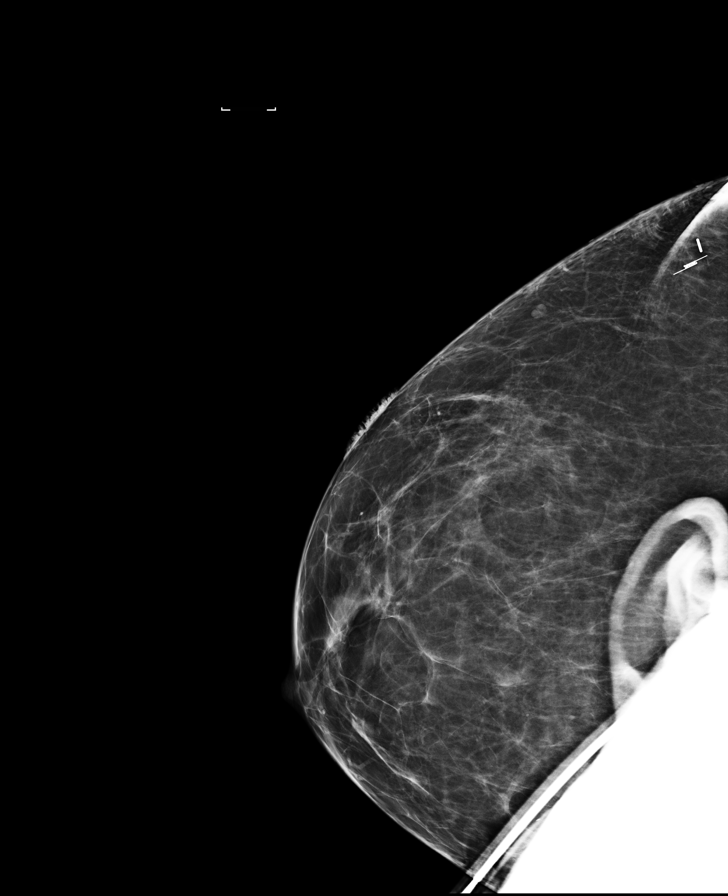

[8 of 8 positions shown; findings below may reference images not displayed]

FINDINGS: Patient presents for radioactive seed localization prior to surgical
excision. I met with the patient and we discussed the procedure of
seed localization including benefits and alternatives. We discussed
the high likelihood of a successful procedure. We discussed the
risks of the procedure including infection, bleeding, tissue injury
and further surgery. We discussed the low dose of radioactivity
involved in the procedure. Informed, written consent was given.

The usual time-out protocol was performed immediately prior to the
procedure.

Using mammographic guidance, sterile technique, 1% lidocaine and an
8-QOM radioactive seed, the RADZI biopsy site marker within the outer
RIGHT breast was localized using a lateral approach. The follow-up
mammogram images confirm the seed in the expected location and were
marked for Dr. Ferienhaus.

Follow-up survey of the patient confirms presence of the radioactive
seed.

Order number of 8-QOM seed:  [id number].

Total activity:  0.244 millicuries reference Date: 09/28/2021

The patient tolerated the procedure well and was released from the
[REDACTED]. She was given instructions regarding seed removal.
IMPRESSION: Radioactive seed localization right breast. No apparent
complications.

## 2023-09-22 ENCOUNTER — Other Ambulatory Visit: Payer: Self-pay | Admitting: Family Medicine

## 2023-09-22 IMAGING — MG MM BREAST SURGICAL SPECIMEN
1 series · 2 of 2 positions shown · non-contrast
Comparison: Previous exam(s).

CLINICAL DATA: Specimen radiograph status post right breast
excisional biopsy.

EXAM:
SPECIMEN RADIOGRAPH OF THE RIGHT BREAST

[Series 1: R · right · 0.07mm/px · 2 of 2 slices shown]
[im 1/2]
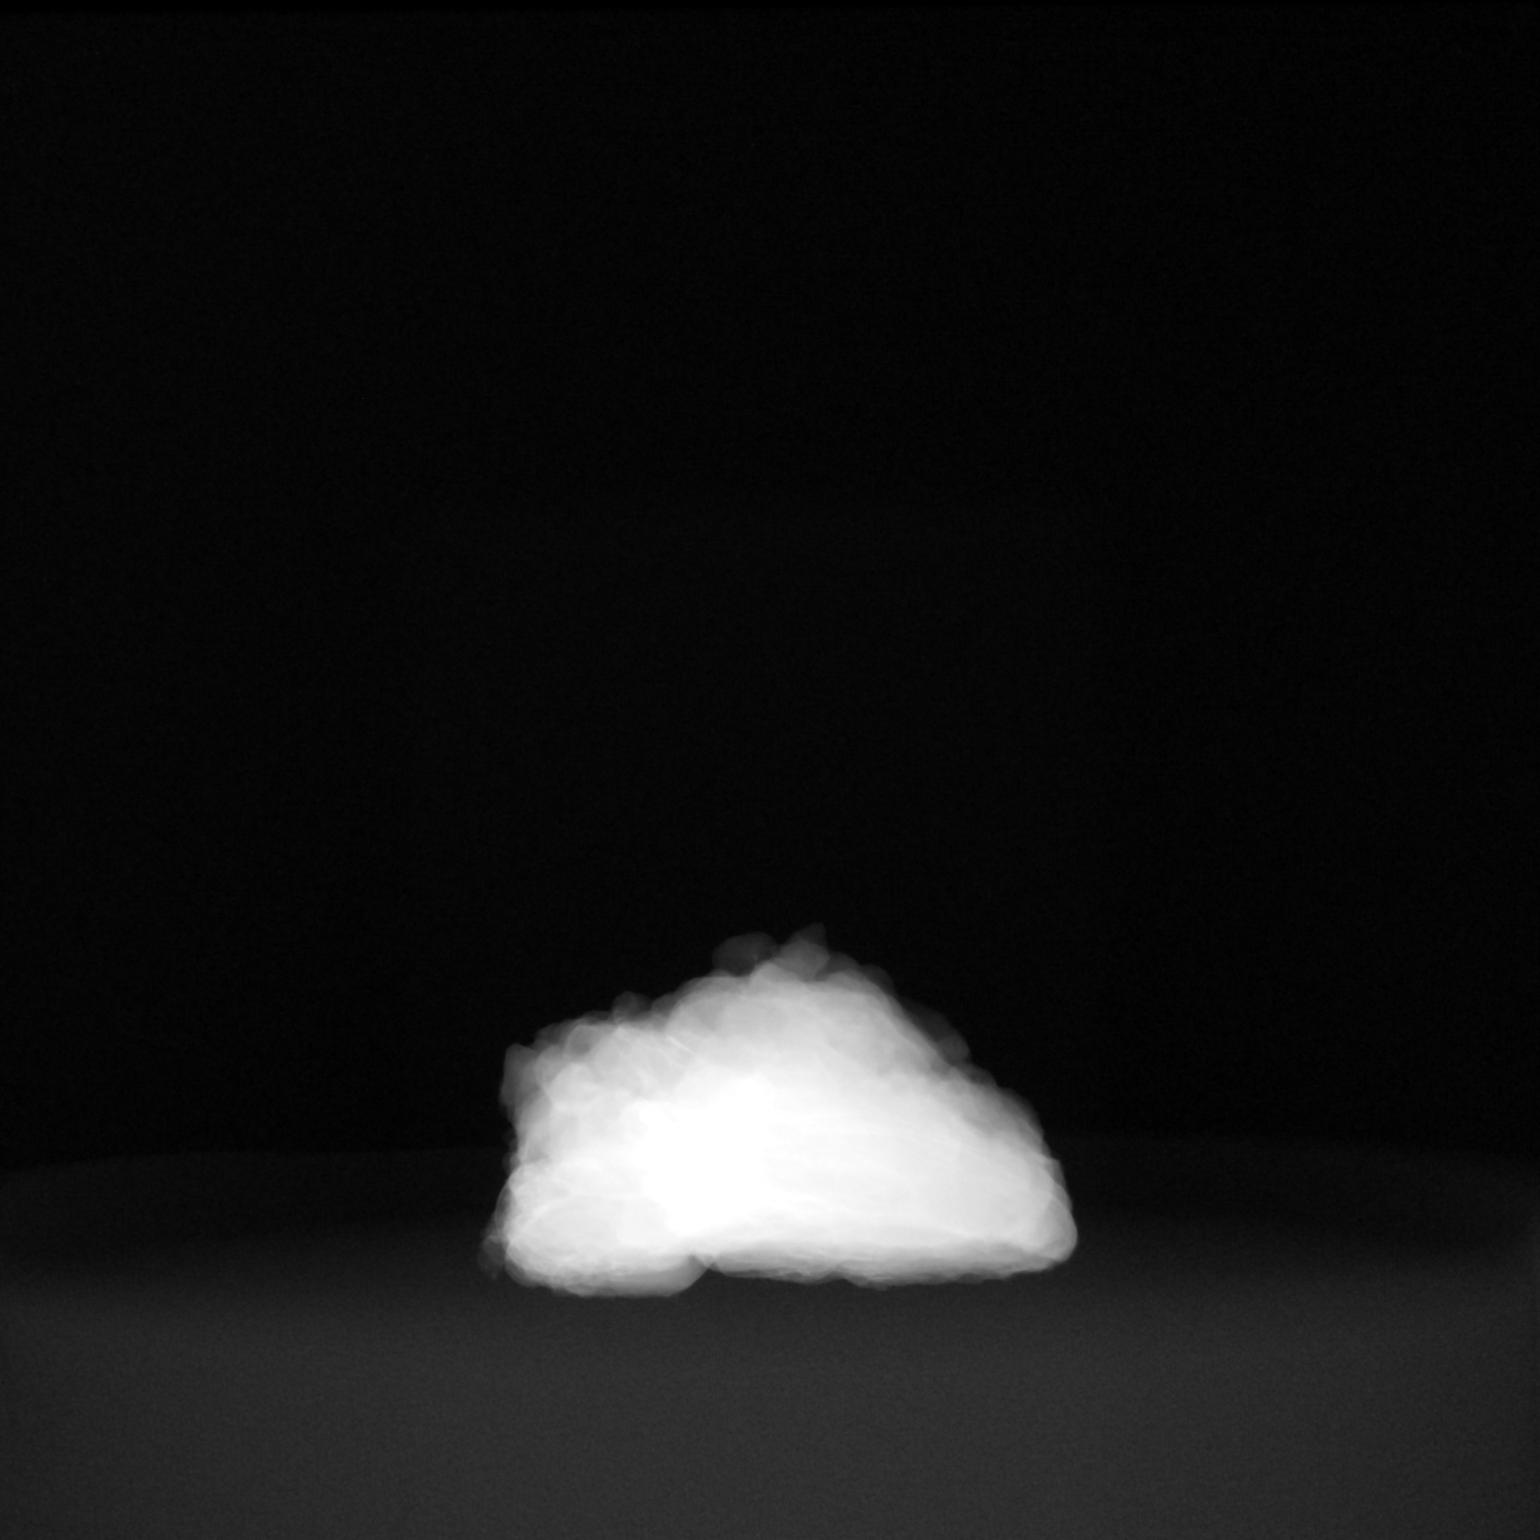
[im 2/2]
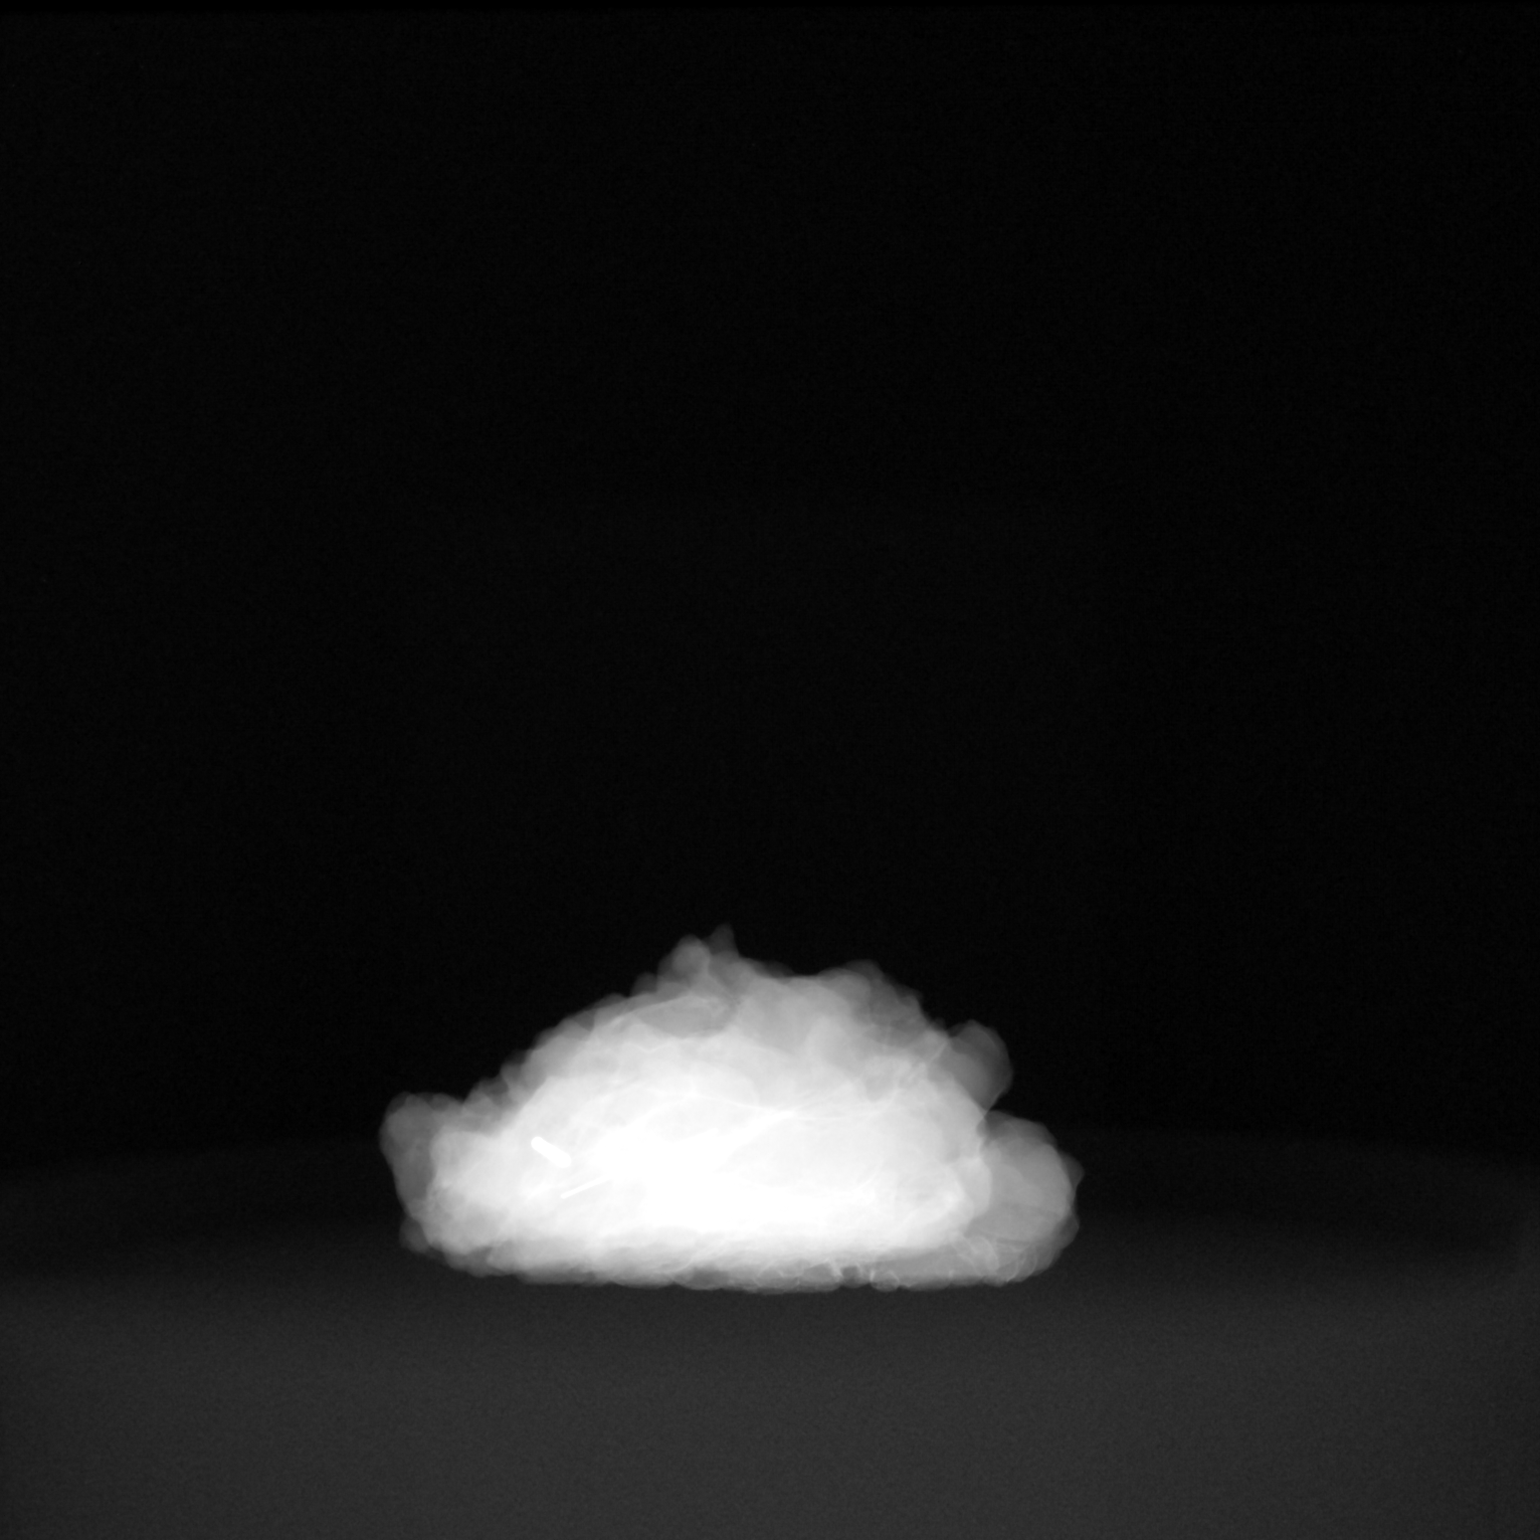

[2 of 2 positions shown; findings below may reference images not displayed]

FINDINGS: Status post excision of the right breast. The radioactive seed and
ONORATO scout RF device are present and completely intact within the
specimen. These findings were communicated with the OR at [DATE]
a.m.
IMPRESSION: Specimen radiograph of the right breast.

## 2023-09-24 DIAGNOSIS — M810 Age-related osteoporosis without current pathological fracture: Secondary | ICD-10-CM | POA: Diagnosis not present

## 2023-10-03 ENCOUNTER — Other Ambulatory Visit: Payer: Self-pay | Admitting: Family Medicine

## 2023-11-12 ENCOUNTER — Ambulatory Visit: Payer: Medicare HMO | Admitting: Neurology

## 2023-11-26 ENCOUNTER — Ambulatory Visit: Payer: Medicare HMO | Admitting: Neurology

## 2023-11-26 ENCOUNTER — Encounter: Payer: Self-pay | Admitting: Neurology

## 2023-11-26 ENCOUNTER — Encounter: Payer: Self-pay | Admitting: Family Medicine

## 2023-11-26 VITALS — BP 153/91 | HR 80 | Ht <= 58 in

## 2023-11-26 DIAGNOSIS — I69320 Aphasia following cerebral infarction: Secondary | ICD-10-CM

## 2023-11-26 DIAGNOSIS — N3281 Overactive bladder: Secondary | ICD-10-CM

## 2023-11-26 DIAGNOSIS — Z8673 Personal history of transient ischemic attack (TIA), and cerebral infarction without residual deficits: Secondary | ICD-10-CM

## 2023-11-26 DIAGNOSIS — F0150 Vascular dementia without behavioral disturbance: Secondary | ICD-10-CM | POA: Diagnosis not present

## 2023-11-26 MED ORDER — OXYBUTYNIN CHLORIDE ER 10 MG PO TB24
10.0000 mg | ORAL_TABLET | Freq: Two times a day (BID) | ORAL | 3 refills | Status: AC
Start: 2023-11-26 — End: ?

## 2023-11-26 NOTE — Patient Instructions (Addendum)
  I had a long d/w patient, her daughter about her recent stroke, carotid occlusion and resultant mild aphasia and right sided weakness and mild cognitive impairment and memory loss, risk for recurrent stroke/TIAs, personally independently reviewed imaging studies and stroke evaluation results and answered questions.Continue Eliquis (apixaban) daily  for A-fib and Brilinta for carotid stent follow-up secondary stroke prevention and maintain strict control of hypertension with blood pressure goal below 130/90, diabetes with hemoglobin A1c goal below 6.5% and lipids with LDL cholesterol goal below 70 mg/dL. I also advised the patient to eat a healthy diet with plenty of whole grains, cereals, fruits and vegetables, exercise regularly and maintain ideal body weight .I encouraged her to use her walker while walking outdoors and long distances at all times and we discussed fall safety precautions.  I also encouraged her to increase participation in cognitively challenging activities like solving crossword puzzles, playing bridge and sudoku.  Patient and daughter would like to hold off on trial of Namenda at the present time.  We discussed memory compensation strategies as well.   .  Followup in the future with me in 6 months  with my NP or call earlier if necessary.  Memory Compensation Strategies  Use "WARM" strategy.  W= write it down  A= associate it  R= repeat it  M= make a mental note  2.   You can keep a Glass blower/designer.  Use a 3-ring notebook with sections for the following: calendar, important names and phone numbers,  medications, doctors' names/phone numbers, lists/reminders, and a section to journal what you did  each day.   3.    Use a calendar to write appointments down.  4.    Write yourself a schedule for the day.  This can be placed on the calendar or in a separate section of the Memory Notebook.  Keeping a  regular schedule can help memory.  5.    Use medication organizer with sections  for each day or morning/evening pills.  You may need help loading it  6.    Keep a basket, or pegboard by the door.  Place items that you need to take out with you in the basket or on the pegboard.  You may also want to  include a message board for reminders.  7.    Use sticky notes.  Place sticky notes with reminders in a place where the task is performed.  For example: " turn off the  stove" placed by the stove, "lock the door" placed on the door at eye level, " take your medications" on  the bathroom mirror or by the place where you normally take your medications.  8.    Use alarms/timers.  Use while cooking to remind yourself to check on food or as a reminder to take your medicine, or as a  reminder to make a call, or as a reminder to perform another task, etc.

## 2023-11-26 NOTE — Progress Notes (Signed)
[facility] [address] [place]. [place] [address] [phone]       OFFICE FOLLOW-UP VISIT NOTE  [name] Date of Birth:  Nov 05, 1935 Medical Record Number:  [id number]   Referring MD: Nada Maclachlan PA-C  Reason for Referral: Stroke follow-up  HPI: Initial visit 05/15/2023 Loretta Gates is a 88 year old pleasant Caucasian lady seen today for initial office follow-up visit following hospital admission for stroke in April 2024.  She is accompanied by her husband and daughter today and history is obtained from them as well as review of electronic medical records.  I personally reviewed pertinent available imaging films in PACS.  She has past medical history of bladder cancer, chronic diastolic heart failure, chronic venous insufficiency, breast cancer status postchemotherapy in 2008, prior head and neck radiation in 1940s at age 19, prior left thyroidectomy, right inferior parathyroid ectomy, hypertension, anemia, mild neuropathy, vitamin D deficiency and paroxysmal A-fib on Eliquis s/p failed cardioversion 2 months prior.  She presented to [facility] emergency room on 02/06/2023 with recurrent spells of left eye transient painless vision loss.  First spell occurred a few days prior when she had complete whiteout of vision that progressed to 2 to blackening of vision lasting about less than a minute then gradually resolving back to baseline over a period of 3 minutes.  Second spell occurred on the day of admission which also lasted barely a minute or so and third spell also occurred on the same day lasting a few minutes.  Following the last episode that day she did not have any right-sided weakness numbness facial droop coordination and speech difficulties and gait unsteadiness She had been off Eliquis for 3 days for a bladder scraping procedure and had just restarted Eliquis and taken only her first dose back earlier that day.  She was evaluated by teleneurologist  and due to her recurrent symptoms she was transferred to [facility] for consideration for left carotid revascularization as she was found to have left carotid occlusion with potential for emergent stent placement to reduce stump embolization.  Her NIH stroke scale was 0 and premorbid modified Rankin was also 0.  Due to her neurological instability with recurrent transient symptoms it was decided to revascularize her occluded left carotid as well as intracranial left M2 which was done successfully by Dr. Corliss Skains with TICI3 revascularization of the left M2 and rescue proximal left ICA angioplasty and stenting.  Postprocedure CT scan showed mild left perisylvian subarachnoid hemorrhage.  MRI scan showed a large posterior left MCA infarct with small left cerebellum, left occipital lobe infarcts and subarachnoid hemorrhage in the left sylvian fissure over the posterior left frontal lobe.  Follow-up CT scan showed stable appearance of the small left temporal and sylvian subarachnoid hemorrhage.  2D echo showed ejection fraction of 55 to 60% and was done in January 2024 hence not repeated.  LDL cholesterol 58 mg percent.  Hemoglobin A1c was 6.2.  Urine drug screen was negative.  Patient had been on Eliquis prior to admission.  She was temporarily placed on aspirin and Brilinta following carotid stent and subsequently switched to Eliquis and Brilinta at discharge.  Patient was transferred to inpatient rehab and did well.  She has been at home now for last 2 months.  She finished home physical therapy.  She is able to walk with a walker when walking outdoors and long distances but can walk short distances by herself.  She walks slowly and carefully and has not  had any falls.  She still has some occasional word hesitancy but speech seems quite improved.  She can have brief conversations but is not able to speak long sentences.  Family has noticed decreased short-term memory though remote memory seems intact.  She is mostly  independent in most activities of daily living though she needs some help with shower.  She is tolerating Eliquis and Brilinta well but does bruise easily but has had no bleeding episodes.  She has had some mild episodes of sundowning and confusion at night.  There is no family history of dementia.  She still has persistent right-sided vision loss which has not improved Update 11/26/2023 : She returns for follow-up after last visit 6 months ago.  She is accompanied by her daughter.  Patient continues to have mild aphasia but is able to communicate better.  She is able to ambulate with a walker and does fairly well.  She has had no falls or injuries.  She prefers to use a wheelchair for long distances.  She has had no recurrent stroke or TIA symptoms.  She remains on Eliquis which is tolerating well without bruising or bleeding and Brilinta for carotid stent.  She did undergo lab work at last visit and vitamin B12, TSH, RPR and homocystine were normal.  Hemoglobin A1c on 08/01/2023 was borderline at 6 patient continues to have memory and cognitive difficulties.  She has good days and bad days.  Family feels that her blood to work with her.  Patient and family reluctant to try medications like Aricept and Namenda after I discussed possible side effects with them.  She has 24-hour care at home.  She does not have any delusions, hallucinations, agitation or unsafe behavior.  She has no new complaints. ROS:   14 system review of systems is positive for speech difficulties, memory loss, confusion, sundowning, bruising, gait and balance difficulties and all other systems negative  PMH:  Past Medical History:  Diagnosis Date   Anemia    Bladder cancer [facility])    urologist--- dr pace   Chronic diastolic (congestive) heart failure (HCC)    followed by dr Jens Som   Chronic venous insufficiency    Edema of both lower extremities    per pt wears compression hose   GERD (gastroesophageal reflux disease)    History  of cancer chemotherapy    completed 2008 for left breast cancer   History of head and neck radiation 1946   per pt approx 1946 or 1947  (age 66) due to 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 patel;   2006  s/p left thyroidectomy w/ right inferior parathyoidectomy  (per path speciman marked parathyroid but only thyroid tissue)   Hypertension    IDA (iron deficiency anemia)    hematology/ oncologist--- dr ennever/ sarah carter NP;  treated w/ IV iron infusions   Multinodular thyroid    Neuropathy    mild hands/ feet, uses cane   Nocturia more than twice per night    Nocturnal leg cramps    OA (osteoarthritis)    knees  OAB (overactive bladder)    Osteoporosis    PAF (paroxysmal atrial fibrillation) [facility])    cardiologist--- dr Jens Som   PONV (postoperative nausea and vomiting)    Vitamin D deficiency    Wears glasses     Social History:  Social History   Socioeconomic History   Marital status: Married    Spouse name: Not on file   Number of children: Not on file   Years of education: Not on file   Highest education level: Bachelor's degree (e.g., BA, AB, BS)  Occupational History   Not on file  Tobacco Use   Smoking status: Never   Smokeless tobacco: Never  Vaping Use   Vaping status: Never Used  Substance and Sexual Activity   Alcohol use: Never   Drug use: Never   Sexual activity: Not Currently    Birth control/protection: Post-menopausal  Other Topics Concern   Not on file  Social History Narrative   Not on file   Social Drivers of Health   Financial  Resource Strain: Low Risk  (02/27/2023)   Overall Financial Resource Strain (CARDIA)    Difficulty of Paying Living Expenses: Not hard at all  Food Insecurity: No Food Insecurity (02/27/2023)   Hunger Vital Sign    Worried About Running Out of Food in the Last Year: Never true    Ran Out of Food in the Last Year: Never true  Transportation Needs: No Transportation Needs (02/27/2023)   PRAPARE - Administrator, Civil Service (Medical): No    Lack of Transportation (Non-Medical): No  Physical Activity: Unknown (02/27/2023)   Exercise Vital Sign    Days of Exercise per Week: 0 days    Minutes of Exercise per Session: Not on file  Stress: No Stress Concern Present (02/27/2023)   [employer] of Occupational Health - Occupational Stress Questionnaire    Feeling of Stress : Not at all  Social Connections: Socially Integrated (02/27/2023)   Social Connection and Isolation Panel [NHANES]    Frequency of Communication with Friends and Family: Twice a week    Frequency of Social Gatherings with Friends and Family: More than three times a week    Attends Religious Services: More than 4 times per year    Active Member of [employer] or Organizations: Yes    Attends Banker Meetings: 1 to 4 times per year    Marital Status: Married  Catering manager Violence: Not At Risk (10/21/2022)   Humiliation, Afraid, Rape, and Kick questionnaire    Fear of Current or Ex-Partner: No    Emotionally Abused: No    Physically Abused: No    Sexually Abused: No    Medications:   Current Outpatient Medications on File Prior to Visit  Medication Sig Dispense Refill   acetaminophen (TYLENOL) 325 MG tablet Take 2 tablets (650 mg total) by mouth every 4 (four) hours as needed for mild pain (or temp > 37.5 C (99.5 F)).     cholestyramine (QUESTRAN) 4 g packet Take 1 packet (4 g total) by mouth daily at 6 PM. 90 packet 3   cinacalcet (SENSIPAR) 30 MG tablet Take 1 tablet (30 mg total) by mouth daily  with breakfast. 30 tablet 0   ELIQUIS 5 MG TABS tablet TAKE ONE TABLET BY MOUTH TWICE A DAY 60 tablet 3   empagliflozin (JARDIANCE) 10 MG TABS tablet Take 1 tablet (10 mg total) by mouth daily. 90 tablet 1   ergocalciferol (VITAMIN D2) 1.25 MG (50000  UT) capsule Take 50,000 Units by mouth every 14 (fourteen) days. Every other saturday     furosemide (LASIX) 20 MG tablet Take 3 tablets (60 mg total) by mouth daily. Take an extra 20 mg as needed for swelling 270 tablet 3   magnesium oxide (MAG-OX) 400 (240 Mg) MG tablet Take 1 tablet (400 mg total) by mouth daily. 90 tablet 3   metoprolol tartrate (LOPRESSOR) 50 MG tablet TAKE ONE AND ONE-HALF TABLETS BY MOUTH TWICE A DAY 270 tablet 0   omeprazole (PRILOSEC) 40 MG capsule Take 1 capsule (40 mg total) by mouth 2 (two) times daily before a meal. 180 capsule 1   ondansetron (ZOFRAN) 4 MG tablet Take 1 tablet (4 mg total) by mouth every 8 (eight) hours as needed for nausea or vomiting. 20 tablet 0   potassium chloride SA (KLOR-CON M) 20 MEQ tablet TAKE ONE TABLET BY MOUTH DAILY - USE WHEN TAKING MORE THAN 20 MG OF FUROSEMIDE DAILY 90 tablet 1   ticagrelor (BRILINTA) 90 MG TABS tablet Take 0.5 tablets (45 mg total) by mouth 2 (two) times daily. 90 tablet 3   No current facility-administered medications on file prior to visit.    Allergies:   Allergies  Allergen Reactions   Bee Venom Swelling    Bee stung her on the inside of her gum. Lips and gums started swelling. No SOB.   Levaquin [Levofloxacin] Hives and Itching   Peanut-Containing Drug Products Other (See Comments)    Stomach pain and diarrhea     Physical Exam General: Pleasant mildly obese elderly Caucasian lady seated, in no evident distress Head: head normocephalic and atraumatic.   Neck: supple with no carotid or supraclavicular bruits Cardiovascular: regular rate and rhythm, no murmurs Musculoskeletal: no deformity Skin:  no rash/petichiae Vascular:  Normal pulses all  extremities  Neurologic Exam Mental Status: Awake and fully alert. Oriented to place and time. Recent and remote memory intact. Attention span, concentration and fund of knowledge appropriate. Mood and affect appropriate.  Speech mostly fluent with occasional word finding difficulties and hesitation.  Good comprehension, naming and repetition.  Diminished recall 0/3.  Able to name 12 animals which can walk on 4 legs.  Mini-Mental status exam not done  Clock drawing 3/4. Cranial Nerves: Fundoscopic exam reveals sharp disc margins. Pupils equal, briskly reactive to light. Extraocular movements full without nystagmus. Visual fields shows dense right homonymous hemianopsia to confrontation. Hearing intact. Facial sensation intact. Face, tongue, palate moves normally and symmetrically.  Motor: Mild weakness of right grip and intrinsic hand muscles.  Orbits left or right upper extremity.  Mild right lower extremity drift and ankle hip flexor and dorsiflexor weakness. Sensory.: intact to touch , pinprick , position and vibratory sensation.  Coordination: Rapid alternating movements normal in all extremities. Finger-to-nose and heel-to-shin performed accurately bilaterally. Gait and Station: Deferred as patient is a wheelchair and did not bring her walker. Reflexes: 1+ and symmetric. Toes downgoing.   NIHSS  4 Modified Rankin  3     05/15/2023   10:25 AM  MMSE - Mini Mental State Exam  Orientation to time 5  Orientation to Place 5  Registration 3  Attention/ Calculation 5  Recall 0  Language- name 2 objects 2  Language- repeat 1  Language- follow 3 step command 2  Language- read & follow direction 0  Write a sentence 1  Copy design 1  Total score 25    ASSESSMENT: 88 year old Caucasian lady with left MCA infarct and  April 2024 secondary to left ICA occlusion with embolization to the left MCA s/p mechanical thrombectomy and revascularization of left MCA with rescue left ICA stenting.  This  occurred when patient was off anticoagulation due to elective surgery.  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     PLAN:  I had a long d/w patient, her daughter about her recent stroke, carotid occlusion and resultant mild aphasia and right sided weakness and mild cognitive impairment and memory loss, risk for recurrent stroke/TIAs, personally independently reviewed imaging studies and stroke evaluation results and answered questions.Continue Eliquis (apixaban) daily  for A-fib and Brilinta for carotid stent follow-up secondary stroke prevention and maintain strict control of hypertension with blood pressure goal below 130/90, diabetes with hemoglobin A1c goal below 6.5% and lipids with LDL cholesterol goal below 70 mg/dL. I also advised the patient to eat a healthy diet with plenty of whole grains, cereals, fruits and vegetables, exercise regularly and maintain ideal body weight .I encouraged her to use her walker while walking outdoors and long distances at all times and we discussed fall safety precautions.  I also encouraged her to increase participation in cognitively challenging activities like solving crossword puzzles, playing bridge and sudoku.  Patient and daughter would like to hold off on trial of Namenda at the present time.  We discussed memory compensation strategies as well.   .  Followup in the future with me in 6 months  with my NP or call earlier if necessary. Greater than 50% time during this 35-minute visit was spent on counseling and coordination of care about her left MCA infarct, carotid occlusion, revascularization and stenting, poststroke memory loss and mild cognitive impairment and atrial fibrillation and discussion about risk-benefit of anticoagulation and answering questions [name], MD Note: This document was prepared with digital dictation and possible smart phrase technology. Any transcriptional  errors that result from this process are unintentional.

## 2023-12-12 ENCOUNTER — Telehealth: Payer: Self-pay | Admitting: Family Medicine

## 2023-12-12 NOTE — Telephone Encounter (Signed)
 Copied from CRM [id number]. Topic: Medicare AWV >> Dec 12, 2023  9:58 AM Payton Doughty wrote: Reason for CRM: Called 12/12/2023 to sched AWV - MAILBOX FULL  Verlee Rossetti; Care Guide Ambulatory Clinical Support [place] [facility] Direct Dial: [phone]

## 2023-12-13 NOTE — Progress Notes (Signed)
 HPI: FU atrial fibrillation and congestive heart failure. Patient previously followed in [facility]. Echocardiogram January 2024 showed normal LV function, mild right ventricular enlargement, moderate pulmonary hypertension, moderate left atrial enlargement, mild right atrial enlargement, mild mitral regurgitation, mild to moderate tricuspid regurgitation. Patient was admitted January 2024 with CHF. Patient was in atrial fibrillation at that time and placed on Cardizem and metoprolol increased. Had elective cardioversion January 01, 2023. Patient did pause her Eliquis for bladder surgery on April 29. Patient developed difficulties with amaurosis fugax and seen February 06, 2023. CTA of the head and neck showed occlusion of the left internal carotid artery just distal to its takeoff felt to be acute to subacute. Patient had angiogram and revascularization by interventional radiology including carotid stent. Follow-up MRI showed large posterior left MCA infarct, small left cerebellum, left occipital lobe infarcts and subarachnoid hemorrhage in the left sylvian fissure and over the posterior left frontal lobe. Since last seen ,h2   Current Outpatient Medications  Medication Sig Dispense Refill   acetaminophen (TYLENOL) 325 MG tablet Take 2 tablets (650 mg total) by mouth every 4 (four) hours as needed for mild pain (or temp > 37.5 C (99.5 F)).     cholestyramine (QUESTRAN) 4 g packet Take 1 packet (4 g total) by mouth daily at 6 PM. 90 packet 3   cinacalcet (SENSIPAR) 30 MG tablet Take 1 tablet (30 mg total) by mouth daily with breakfast. 30 tablet 0   ELIQUIS 5 MG TABS tablet TAKE ONE TABLET BY MOUTH TWICE A DAY 60 tablet 3   empagliflozin (JARDIANCE) 10 MG TABS tablet Take 1 tablet (10 mg total) by mouth daily. 90 tablet 1   ergocalciferol (VITAMIN D2) 1.25 MG (50000 UT) capsule Take 50,000 Units by mouth every 14 (fourteen) days. Every other saturday     furosemide (LASIX) 20 MG tablet Take 3 tablets  (60 mg total) by mouth daily. Take an extra 20 mg as needed for swelling 270 tablet 3   magnesium oxide (MAG-OX) 400 (240 Mg) MG tablet Take 1 tablet (400 mg total) by mouth daily. 90 tablet 3   metoprolol tartrate (LOPRESSOR) 50 MG tablet TAKE ONE AND ONE-HALF TABLETS BY MOUTH TWICE A DAY 270 tablet 0   omeprazole (PRILOSEC) 40 MG capsule Take 1 capsule (40 mg total) by mouth 2 (two) times daily before a meal. 180 capsule 1   ondansetron (ZOFRAN) 4 MG tablet Take 1 tablet (4 mg total) by mouth every 8 (eight) hours as needed for nausea or vomiting. 20 tablet 0   oxybutynin (DITROPAN XL) 10 MG 24 hr tablet Take 1 tablet (10 mg total) by mouth in the morning and at bedtime. 60 tablet 3   potassium chloride SA (KLOR-CON M) 20 MEQ tablet TAKE ONE TABLET BY MOUTH DAILY - USE WHEN TAKING MORE THAN 20 MG OF FUROSEMIDE DAILY 90 tablet 1   ticagrelor (BRILINTA) 90 MG TABS tablet Take 0.5 tablets (45 mg total) by mouth 2 (two) times daily. 90 tablet 3   No current facility-administered medications for this visit.     Past Medical History:  Diagnosis Date   Anemia    Bladder cancer [facility])    urologist--- dr pace   Chronic diastolic (congestive) heart failure (HCC)    followed by dr Jens Som   Chronic venous insufficiency    Edema of both lower extremities    per pt wears compression hose   GERD (gastroesophageal reflux disease)    History  of cancer chemotherapy    completed 2008 for left breast cancer   History of head and neck radiation 1946   per pt approx 1946 or 1  (age 17) due to profound bilateral hearing loss told from scarlett fever/ measles,  once weekly for several weeks had  head/ neck radiation,  hearing was restored without neededing hearing aids   History of left breast cancer 2008   malignant neoplasm of overlapping sites of left breast, ER+;  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 patel;   2006  s/p left thyroidectomy w/ right inferior parathyoidectomy  (per path speciman marked parathyroid but only thyroid tissue)   Hypertension    IDA (iron deficiency anemia)    hematology/ oncologist--- dr ennever/ sarah carter NP;  treated w/ IV iron infusions   Multinodular thyroid    Neuropathy    mild hands/ feet, uses cane   Nocturia more than twice per night    Nocturnal leg cramps    OA (osteoarthritis)    knees   OAB (overactive bladder)    Osteoporosis    PAF (paroxysmal atrial fibrillation) [facility])    cardiologist--- dr Jens Som   PONV (postoperative nausea and vomiting)    Vitamin D deficiency    Wears glasses     Past Surgical History:  Procedure Laterality Date   BREAST LUMPECTOMY WITH RADIOACTIVE SEED LOCALIZATION Right 10/25/2021   Procedure: RIGHT BREAST LUMPECTOMY WITH RADIOACTIVE SEED LOCALIZATION;  Surgeon: Harriette Bouillon, MD;  Location: [facility];  Service: General;  Laterality: Right;   CARDIOVERSION N/A 01/01/2023   Procedure: CARDIOVERSION;  Surgeon: [name], MD;  Location: [facility];  Service: Cardiovascular;  Laterality: N/A;   CHOLECYSTECTOMY, LAPAROSCOPIC  02/25/2017   @[facility]    COLONOSCOPY WITH ESOPHAGOGASTRODUODENOSCOPY (EGD)  2022   IR ANGIO INTRA EXTRACRAN SEL COM CAROTID INNOMINATE UNI R MOD SED  02/07/2023   IR ANGIO VERTEBRAL SEL SUBCLAVIAN INNOMINATE UNI L MOD SED  02/07/2023   IR CT HEAD LTD  02/07/2023   IR INTRAVSC STENT CERV CAROTID W/O EMB-PROT MOD SED INC ANGIO  02/07/2023   IR PERCUTANEOUS ART THROMBECTOMY/INFUSION INTRACRANIAL INC DIAG ANGIO  02/07/2023   IR RADIOLOGIST EVAL & MGMT  03/10/2023   MASTECTOMY WITH AXILLARY LYMPH NODE DISSECTION Left 03/05/2007   @ [facility]   OVARIAN CYST SURGERY     age 88;   abdominal   RADIOLOGY WITH ANESTHESIA N/A 02/06/2023   Procedure: IR WITH ANESTHESIA;  Surgeon: Radiologist, Medication, MD;  Location: [facility] OR;  Service:  Radiology;  Laterality: N/A;   THYROIDECTOMY, PARTIAL Left 2006   left lobectomy and right infertior parathyroidectomy   TONSILLECTOMY     child    Social History   Socioeconomic History   Marital status: Married    Spouse name: Not on file   Number of children: Not on file   Years of education: Not on file   Highest education level: Bachelor's degree (e.g., BA, AB, BS)  Occupational History   Not on file  Tobacco Use   Smoking status: Never   Smokeless tobacco: Never  Vaping Use   Vaping status: Never Used  Substance and Sexual Activity   Alcohol use: Never   Drug use: Never   Sexual activity: Not Currently    Birth control/protection: Post-menopausal  Other Topics Concern   Not on file  Social History Narrative   Not on file   Social Drivers of Health   Financial Resource Strain: Low Risk  (02/27/2023)   Overall Financial Resource Strain (CARDIA)    Difficulty of Paying Living Expenses: Not hard at all  Food Insecurity: No Food Insecurity (02/27/2023)   Hunger Vital Sign    Worried About Running Out of Food in the Last Year: Never true    Ran Out of Food in the Last Year: Never true  Transportation Needs: No Transportation Needs (02/27/2023)   PRAPARE - Administrator, Civil Service (Medical): No    Lack of Transportation (Non-Medical): No  Physical Activity: Unknown (02/27/2023)   Exercise Vital Sign    Days of Exercise per Week: 0 days    Minutes of Exercise per Session: Not on file  Stress: No Stress Concern Present (02/27/2023)   [employer] of Occupational Health - Occupational Stress Questionnaire    Feeling of Stress : Not at all  Social Connections: Socially Integrated (02/27/2023)   Social Connection and Isolation Panel [NHANES]    Frequency of Communication with Friends and Family: Twice a week    Frequency of Social Gatherings with Friends and Family: More than three times a week    Attends Religious Services: More than 4 times per year     Active Member of [employer] or Organizations: Yes    Attends Banker Meetings: 1 to 4 times per year    Marital Status: Married  Catering manager Violence: Not At Risk (10/21/2022)   Humiliation, Afraid, Rape, and Kick questionnaire    Fear of Current or Ex-Partner: No    Emotionally Abused: No    Physically Abused: No    Sexually Abused: No    Family History  Problem Relation Age of Onset   Sudden death Neg Hx     ROS: no fevers or chills, productive cough, hemoptysis, dysphasia, odynophagia, melena, hematochezia, dysuria, hematuria, rash, seizure activity, orthopnea, PND, pedal edema, claudication. Remaining systems are negative.  Physical Exam: Well-developed well-nourished in no acute distress.  Skin is warm and dry.  HEENT is normal.  Neck is supple.  Chest is clear to auscultation with normal expansion.  Cardiovascular exam is irregular Abdominal exam nontender or distended. No masses palpated. Extremities show no edema. neuro grossly intact  EKG Interpretation Date/Time:  Wednesday December 26 2023 10:35:27 EDT Ventricular Rate:  78 PR Interval:    QRS Duration:  86 QT Interval:  412 QTC Calculation: 469 R Axis:   63  Text Interpretation: Atrial fibrillation When compared with ECG of 26-Mar-2023 17:54, PREVIOUS ECG IS PRESENT Confirmed by Olga Millers ([id number]) on 12/26/2023 10:38:16 AM    A/P  1 permanent atrial fibrillation-continue metoprolol for rate control.  Continue apixaban.  2 hypertension-patient's blood pressure is controlled.  Continue present medications.  3 history of CVA-status post stenting of her left internal carotid artery.  Follow-up neurology.  She is on Brilinta based on their recommendations.  Check hemoglobin.  4 chronic diastolic congestive heart failure-euvolemic on examination.  Continue diuretic at present dose.  Check potassium and renal function.  Olga Millers, MD

## 2023-12-20 DIAGNOSIS — H02831 Dermatochalasis of right upper eyelid: Secondary | ICD-10-CM | POA: Insufficient documentation

## 2023-12-20 DIAGNOSIS — H5213 Myopia, bilateral: Secondary | ICD-10-CM | POA: Insufficient documentation

## 2023-12-26 ENCOUNTER — Ambulatory Visit: Payer: Medicare HMO | Attending: Cardiology | Admitting: Cardiology

## 2023-12-26 ENCOUNTER — Encounter: Payer: Self-pay | Admitting: Cardiology

## 2023-12-26 VITALS — BP 139/72 | HR 78 | Ht <= 58 in | Wt 173.0 lb

## 2023-12-26 DIAGNOSIS — I5030 Unspecified diastolic (congestive) heart failure: Secondary | ICD-10-CM | POA: Diagnosis not present

## 2023-12-26 DIAGNOSIS — I482 Chronic atrial fibrillation, unspecified: Secondary | ICD-10-CM | POA: Diagnosis not present

## 2023-12-26 DIAGNOSIS — I10 Essential (primary) hypertension: Secondary | ICD-10-CM

## 2023-12-26 NOTE — Patient Instructions (Signed)
   Follow-Up: At [facility], you and your health needs are our priority.  As part of our continuing mission to provide you with exceptional heart care, we have created designated Provider Care Teams.  These Care Teams include your primary Cardiologist (physician) and Advanced Practice Providers (APPs -  Physician Assistants and Nurse Practitioners) who all work together to provide you with the care you need, when you need it.  We recommend signing up for the patient portal called "MyChart".  Sign up information is provided on this After Visit Summary.  MyChart is used to connect with patients for Virtual Visits (Telemedicine).  Patients are able to view lab/test results, encounter notes, upcoming appointments, etc.  Non-urgent messages can be sent to your provider as well.   To learn more about what you can do with MyChart, go to [email or web address].    Your next appointment:   6 month(s)  Provider:   Olga Millers, MD

## 2023-12-27 DIAGNOSIS — H43813 Vitreous degeneration, bilateral: Secondary | ICD-10-CM | POA: Diagnosis not present

## 2023-12-27 DIAGNOSIS — H04123 Dry eye syndrome of bilateral lacrimal glands: Secondary | ICD-10-CM | POA: Diagnosis not present

## 2023-12-27 DIAGNOSIS — H25013 Cortical age-related cataract, bilateral: Secondary | ICD-10-CM | POA: Diagnosis not present

## 2023-12-27 DIAGNOSIS — H02834 Dermatochalasis of left upper eyelid: Secondary | ICD-10-CM | POA: Diagnosis not present

## 2023-12-27 DIAGNOSIS — H524 Presbyopia: Secondary | ICD-10-CM | POA: Diagnosis not present

## 2023-12-27 DIAGNOSIS — Z8673 Personal history of transient ischemic attack (TIA), and cerebral infarction without residual deficits: Secondary | ICD-10-CM | POA: Diagnosis not present

## 2023-12-27 DIAGNOSIS — H5213 Myopia, bilateral: Secondary | ICD-10-CM | POA: Diagnosis not present

## 2023-12-27 DIAGNOSIS — H52203 Unspecified astigmatism, bilateral: Secondary | ICD-10-CM | POA: Diagnosis not present

## 2023-12-27 DIAGNOSIS — H2513 Age-related nuclear cataract, bilateral: Secondary | ICD-10-CM | POA: Diagnosis not present

## 2023-12-27 DIAGNOSIS — H02831 Dermatochalasis of right upper eyelid: Secondary | ICD-10-CM | POA: Diagnosis not present

## 2023-12-27 LAB — BASIC METABOLIC PANEL
BUN/Creatinine Ratio: 27 (ref 12–28)
BUN: 24 mg/dL (ref 8–27)
CO2: 26 mmol/L (ref 20–29)
Calcium: 9.3 mg/dL (ref 8.7–10.3)
Chloride: 103 mmol/L (ref 96–106)
Creatinine, Ser: 0.89 mg/dL (ref 0.57–1.00)
Glucose: 87 mg/dL (ref 70–99)
Potassium: 3.9 mmol/L (ref 3.5–5.2)
Sodium: 146 mmol/L — ABNORMAL HIGH (ref 134–144)
eGFR: 63 mL/min/{1.73_m2} (ref >59)

## 2023-12-27 LAB — CBC
Hematocrit: 40.2 % (ref 34.0–46.6)
Hemoglobin: 13 g/dL (ref 11.1–15.9)
MCH: 27.6 pg (ref 26.6–33.0)
MCHC: 32.3 g/dL (ref 31.5–35.7)
MCV: 85 fL (ref 79–97)
Platelets: 291 10*3/uL (ref 150–450)
RBC: 4.71 x10E6/uL (ref 3.77–5.28)
RDW: 14.4 % (ref 11.7–15.4)
WBC: 8.9 10*3/uL (ref 3.4–10.8)

## 2023-12-30 ENCOUNTER — Other Ambulatory Visit: Payer: Self-pay | Admitting: Family Medicine

## 2024-01-01 ENCOUNTER — Encounter: Payer: Self-pay | Admitting: *Deleted

## 2024-01-07 ENCOUNTER — Other Ambulatory Visit: Payer: Self-pay | Admitting: Family Medicine

## 2024-01-19 ENCOUNTER — Other Ambulatory Visit: Payer: Self-pay | Admitting: Family Medicine

## 2024-01-21 ENCOUNTER — Other Ambulatory Visit: Payer: Self-pay | Admitting: Family Medicine

## 2024-01-25 NOTE — Progress Notes (Unsigned)
 [facility] [address], [address] [place], [place] [address] [phone] [phone]  Date:  01/30/2024   Name:  Loretta Gates   DOB:  [birth year]   MRN:  [id number]  PCP:  Kaylee Partridge, MD    Chief Complaint: No chief complaint on file.   History of Present Illness:  Loretta Gates is a 88 y.o. very pleasant female patient who presents with the following:  Pt seen today for periodic recheck Last seen by myself in October  She suffered a stroke about a year ago- at our last visit she was making a lot of progress in getting her speech back   She has been married to Miguel Barrera for 60 years  Most recent visit with myself was in July History of heart failure, atrial fibrillation on Eliquis  and metoprolol  CVA about one year ago    Visit with Dr Audery Blazing last month:  1 permanent atrial fibrillation-continue metoprolol  for rate control.  Continue apixaban . 2 hypertension-patient's blood pressure is controlled.  Continue present medications. 3 history of CVA-status post stenting of her left internal carotid artery.  Follow-up neurology.  She is on Brilinta  based on their recommendations.  Check hemoglobin. 4 chronic diastolic congestive heart failure-euvolemic on examination.  Continue diuretic at present dose.  Check potassium and renal function.   Eliquis  Jardiance  Lasix  Magnesium  Ditropan  Potassium Brilinta   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   ICAO (internal carotid artery occlusion), left 02/06/2023   Chest pain 12/08/2022   Class 3 obesity 10/21/2022   Acute on chronic diastolic CHF (congestive heart failure) (HCC) 10/20/2022   IDA (iron deficiency anemia) 01/13/2022   Atrial fibrillation (HCC) 01/05/2022   Breast cancer (HCC) 01/05/2022   Primary hyperparathyroidism (HCC)  01/05/2022   Essential hypertension 01/05/2022   Age-related nuclear cataract of both eyes 12/11/2018   Abnormal INR 01/29/2018   Long term (current) use of anticoagulants 01/29/2018   Encounter for therapeutic drug monitoring 01/29/2018   Vitamin D  deficiency 05/03/2017   S/P laparoscopic cholecystectomy 02/26/2017   Calculus of gallbladder without cholecystitis without obstruction 02/23/2017   Diarrhea of presumed infectious origin 02/23/2017   Hyperglycemia 02/23/2017   Right upper quadrant abdominal pain 02/23/2017   Lower extremity edema 01/18/2016   Age-related osteoporosis without current pathological fracture 01/17/2016   Diverticulosis of large intestine without hemorrhage 01/17/2016   GERD (gastroesophageal reflux disease) 01/17/2016   History of left breast cancer 01/17/2016   Multinodular goiter 01/17/2016   OAB (overactive bladder) 01/17/2016   History of cancer chemotherapy 03/06/2015   History of left mastectomy 03/06/2015   Malignant neoplasm of overlapping sites of left female breast (HCC) 03/06/2015    Past Medical History:  Diagnosis Date   Anemia    Bladder cancer [facility])    urologist--- dr pace   Chronic diastolic (congestive) heart failure (HCC)    followed by dr Audery Blazing   Chronic venous insufficiency    Edema of both lower extremities    per pt wears compression hose   GERD (gastroesophageal reflux disease)    History of cancer chemotherapy    completed 2008 for left breast cancer   History of head and neck radiation 1946   per pt approx 1946 or 1947  (age 3) due to profound bilateral hearing loss told from scarlett fever/  measles,  once weekly for several weeks had  head/ neck radiation,  hearing was restored without neededing hearing aids   History of left breast cancer 2008   malignant neoplasm of overlapping sites of left breast, ER+;  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 patel;   2006  s/p left thyroidectomy w/ right inferior parathyoidectomy  (per path speciman marked parathyroid but only thyroid tissue)   Hypertension    IDA (iron deficiency anemia)    hematology/ oncologist--- dr ennever/ sarah carter NP;  treated w/ IV iron infusions   Multinodular thyroid    Neuropathy    mild hands/ feet, uses cane   Nocturia more than twice per night    Nocturnal leg cramps    OA (osteoarthritis)    knees   OAB (overactive bladder)    Osteoporosis    PAF (paroxysmal atrial fibrillation) [facility])    cardiologist--- dr Audery Blazing   PONV (postoperative nausea and vomiting)    Vitamin D  deficiency    Wears glasses     Past Surgical History:  Procedure Laterality Date   BREAST LUMPECTOMY WITH RADIOACTIVE SEED LOCALIZATION Right 10/25/2021   Procedure: RIGHT BREAST LUMPECTOMY WITH RADIOACTIVE SEED LOCALIZATION;  Surgeon: Sim Dryer, MD;  Location: [facility];  Service: General;  Laterality: Right;   CARDIOVERSION N/A 01/01/2023   Procedure: CARDIOVERSION;  Surgeon: Lenise Quince, MD;  Location: [facility] ENDOSCOPY;  Service: Cardiovascular;  Laterality: N/A;   CHOLECYSTECTOMY, LAPAROSCOPIC  02/25/2017   @[facility]    COLONOSCOPY WITH ESOPHAGOGASTRODUODENOSCOPY (EGD)  2022   IR ANGIO INTRA EXTRACRAN SEL COM CAROTID INNOMINATE UNI R MOD SED  02/07/2023   IR ANGIO VERTEBRAL SEL SUBCLAVIAN INNOMINATE UNI L MOD SED  02/07/2023   IR CT HEAD LTD  02/07/2023   IR INTRAVSC STENT CERV CAROTID W/O EMB-PROT MOD SED INC ANGIO  02/07/2023   IR PERCUTANEOUS ART THROMBECTOMY/INFUSION INTRACRANIAL INC DIAG ANGIO  02/07/2023   IR RADIOLOGIST EVAL & MGMT  03/10/2023   MASTECTOMY WITH AXILLARY LYMPH NODE DISSECTION Left 03/05/2007   @ [facility]   OVARIAN CYST SURGERY     age 20;   abdominal   RADIOLOGY WITH ANESTHESIA N/A 02/06/2023   Procedure: IR WITH ANESTHESIA;  Surgeon: Radiologist, Medication, MD;  Location: [facility]  OR;  Service: Radiology;  Laterality: N/A;   THYROIDECTOMY, PARTIAL Left 2006   left lobectomy and right infertior parathyroidectomy   TONSILLECTOMY     child    Social History   Tobacco Use   Smoking status: Never   Smokeless tobacco: Never  Vaping Use   Vaping status: Never Used  Substance Use Topics   Alcohol use: Never   Drug use: Never    Family History  Problem Relation Age of Onset   Sudden death Neg Hx     Allergies  Allergen Reactions   Bee Venom Swelling    Bee stung her on the inside of her gum. Lips and gums started swelling. No SOB.   Levaquin [Levofloxacin] Hives and Itching   Peanut-Containing Drug Products Other (See Comments)    Stomach pain and diarrhea     Medication list has been reviewed and updated.  Current Outpatient Medications on File Prior to Visit  Medication Sig Dispense Refill   acetaminophen  (TYLENOL ) 325 MG tablet Take 2 tablets (650 mg total) by mouth every 4 (four) hours as  needed for mild pain (or temp > 37.5 C (99.5 F)).     apixaban  (ELIQUIS ) 5 MG TABS tablet TAKE ONE TABLET BY MOUTH TWICE A DAY 180 tablet 3   cholestyramine  (QUESTRAN ) 4 g packet Take 1 packet (4 g total) by mouth daily at 6 PM. 90 packet 3   cinacalcet  (SENSIPAR ) 30 MG tablet Take 1 tablet (30 mg total) by mouth daily with breakfast. 30 tablet 0   empagliflozin  (JARDIANCE ) 10 MG TABS tablet Take 1 tablet (10 mg total) by mouth daily. 90 tablet 1   ergocalciferol  (VITAMIN D2) 1.25 MG (50000 UT) capsule Take 50,000 Units by mouth every 14 (fourteen) days. Every other saturday     furosemide  (LASIX ) 20 MG tablet Take 3 tablets (60 mg total) by mouth daily. Take an extra 20 mg as needed for swelling 270 tablet 3   magnesium  oxide (MAG-OX) 400 (240 Mg) MG tablet Take 1 tablet (400 mg total) by mouth daily. 90 tablet 3   metoprolol  tartrate (LOPRESSOR ) 50 MG tablet TAKE ONE AND ONE-HALF TABLETS BY MOUTH TWICE A DAY 270 tablet 0   omeprazole  (PRILOSEC) 40 MG capsule TAKE  ONE CAPSULE BY MOUTH TWICE A DAY BEFORE A MEAL 180 capsule 1   ondansetron  (ZOFRAN ) 4 MG tablet Take 1 tablet (4 mg total) by mouth every 8 (eight) hours as needed for nausea or vomiting. 20 tablet 0   oxybutynin  (DITROPAN  XL) 10 MG 24 hr tablet Take 1 tablet (10 mg total) by mouth in the morning and at bedtime. 60 tablet 3   potassium chloride  SA (KLOR-CON  M) 20 MEQ tablet TAKE ONE TABLET BY MOUTH DAILY - USE WHEN TAKING MORE THAN 20 MG OF FUROSEMIDE  DAILY 90 tablet 1   ticagrelor  (BRILINTA ) 90 MG TABS tablet Take 0.5 tablets (45 mg total) by mouth 2 (two) times daily. 90 tablet 3   No current facility-administered medications on file prior to visit.    Review of Systems:  As per HPI- otherwise negative.   Physical Examination: There were no vitals filed for this visit. There were no vitals filed for this visit. There is no height or weight on file to calculate BMI. Ideal Body Weight:    GEN: no acute distress. HEENT: Atraumatic, Normocephalic.  Ears and Nose: No external deformity. CV: RRR, No M/G/R. No JVD. No thrill. No extra heart sounds. PULM: CTA B, no wheezes, crackles, rhonchi. No retractions. No resp. distress. No accessory muscle use. ABD: S, NT, ND, +BS. No rebound. No HSM. EXTR: No c/c/e PSYCH: Normally interactive. Conversant.    Assessment and Plan: ***  Signed Gates Kasal, MD

## 2024-01-25 NOTE — Patient Instructions (Incomplete)
 It was great to see you again today! Recommend dose of RSV vaccine, the newer shingles series (Shingrix) and a covid booster  Assuming all is well please see me in about 6 months and have a wonderful summer

## 2024-01-30 ENCOUNTER — Ambulatory Visit (INDEPENDENT_AMBULATORY_CARE_PROVIDER_SITE_OTHER): Payer: Medicare HMO | Admitting: Family Medicine

## 2024-01-30 VITALS — BP 120/78 | HR 70 | Temp 97.6°F | Resp 18 | Ht <= 58 in | Wt 174.0 lb

## 2024-01-30 DIAGNOSIS — I5032 Chronic diastolic (congestive) heart failure: Secondary | ICD-10-CM

## 2024-01-30 DIAGNOSIS — I4891 Unspecified atrial fibrillation: Secondary | ICD-10-CM

## 2024-01-30 DIAGNOSIS — Z5181 Encounter for therapeutic drug level monitoring: Secondary | ICD-10-CM

## 2024-01-30 DIAGNOSIS — R7303 Prediabetes: Secondary | ICD-10-CM

## 2024-01-30 DIAGNOSIS — Z8673 Personal history of transient ischemic attack (TIA), and cerebral infarction without residual deficits: Secondary | ICD-10-CM

## 2024-01-30 DIAGNOSIS — I10 Essential (primary) hypertension: Secondary | ICD-10-CM | POA: Diagnosis not present

## 2024-01-30 MED ORDER — METOPROLOL TARTRATE 50 MG PO TABS
ORAL_TABLET | ORAL | 3 refills | Status: AC
Start: 2024-01-30 — End: ?

## 2024-01-30 MED ORDER — EMPAGLIFLOZIN 10 MG PO TABS
10.0000 mg | ORAL_TABLET | Freq: Every day | ORAL | 3 refills | Status: AC
Start: 2024-01-30 — End: ?

## 2024-01-30 MED ORDER — POTASSIUM CHLORIDE CRYS ER 20 MEQ PO TBCR: 20.0000 meq | EXTENDED_RELEASE_TABLET | Freq: Once | ORAL | 3 refills | Status: AC

## 2024-02-08 ENCOUNTER — Other Ambulatory Visit: Payer: Self-pay

## 2024-02-13 ENCOUNTER — Other Ambulatory Visit: Payer: Self-pay | Admitting: Family Medicine

## 2024-02-14 ENCOUNTER — Encounter: Payer: Self-pay | Admitting: Family Medicine

## 2024-02-14 ENCOUNTER — Telehealth: Payer: Self-pay

## 2024-02-14 NOTE — Telephone Encounter (Signed)
 Called back and explained I asked the pharmacy to reroute rx request to the correct provider

## 2024-02-14 NOTE — Telephone Encounter (Signed)
 Copied from CRM [id number]. Topic: Clinical - Medication Question >> Feb 14, 2024  3:14 PM Deaijah H wrote: Reason for CRM: Patient called in to follow up on why prescription cinacalcet  (SENSIPAR ) 30 MG tablet was refused reached out to CAL to figure out and per CAL was advised that Dr. Geralyn Knee is checking to see whether the dosage need to be changed or the prescription in whole. Please call [phone] with an update

## 2024-03-05 ENCOUNTER — Ambulatory Visit (INDEPENDENT_AMBULATORY_CARE_PROVIDER_SITE_OTHER): Admitting: Family Medicine

## 2024-03-05 ENCOUNTER — Ambulatory Visit: Payer: Self-pay

## 2024-03-05 ENCOUNTER — Encounter: Payer: Self-pay | Admitting: Family Medicine

## 2024-03-05 VITALS — BP 124/68 | HR 54 | Temp 98.0°F | Resp 16 | Ht <= 58 in | Wt 174.0 lb

## 2024-03-05 DIAGNOSIS — K5900 Constipation, unspecified: Secondary | ICD-10-CM | POA: Diagnosis not present

## 2024-03-05 NOTE — Telephone Encounter (Signed)
 Copied from CRM [id number]. Topic: Clinical - Red Word Triage >> Mar 05, 2024 11:43 AM Howard Macho wrote: Red Word that prompted transfer to Nurse Triage: patient daughter called stating the patient has extreme constipation with no relief since Friday. Patient daughter stated they have tried mirlax,stool softener and an enema  Chief Complaint: constipation Symptoms: rectal pain, abd pain Frequency: hasn't had bowel movement since Friday Pertinent Negatives: Patient denies fever, n/v Disposition: [] ED /[] Urgent Care (no appt availability in office) / [x] Appointment(In office/virtual)/ []  [place] [facility]/ [] Home Care/ [] Refused Recommended Disposition /[] [place] [facility]/ []  Follow-up with PCP Additional Notes: apt made for today; care advice given, denies questions; instructed to go to ER if becomes worse.   Reason for Disposition  Last bowel movement (BM) > 4 days ago  Answer Assessment - Initial Assessment Questions 1. STOOL PATTERN OR FREQUENCY: "How often do you have a bowel movement (BM)?"  (Normal range: 3 times a day to every 3 days)  "When was your last BM?"       Every day or once every two days 2. STRAINING: "Do you have to strain to have a BM?"      yes 3. RECTAL PAIN: "Does your rectum hurt when the stool comes out?" If Yes, ask: "Do you have hemorrhoids? How bad is the pain?"  (Scale 1-10; or mild, moderate, severe)     yes 4. STOOL COMPOSITION: "Are the stools hard?"      yes 5. BLOOD ON STOOLS: "Has there been any blood on the toilet tissue or on the surface of the BM?" If Yes, ask: "When was the last time?"     No blood 6. CHRONIC CONSTIPATION: "Is this a new problem for you?"  If No, ask: "How long have you had this problem?" (days, weeks, months)      Yes, but not this bad 7. CHANGES IN DIET OR HYDRATION: "Have there been any recent changes in your diet?" "How much fluids are you drinking on a daily basis?"  "How much have you had to drink today?"     no 8.  MEDICINES: "Have you been taking any new medicines?" "Are you taking any narcotic pain medicines?" (e.g., Dilaudid, morphine, Percocet, Vicodin)     Mirlax, stool softners, enema this am 9. LAXATIVES: "Have you been using any stool softeners, laxatives, or enemas?"  If Yes, ask "What, how often, and when was the last time?"     See above 10. ACTIVITY:  "How much walking do you do every day?"  "Has your activity level decreased in the past week?"        no 11. CAUSE: "What do you think is causing the constipation?"        unknown 12. OTHER SYMPTOMS: "Do you have any other symptoms?" (e.g., abdomen pain, bloating, fever, vomiting)       Abd pain last night 13. MEDICAL HISTORY: "Do you have a history of hemorrhoids, rectal fissures, or rectal surgery or rectal abscess?"         no 14. PREGNANCY: "Is there any chance you are pregnant?" "When was your last menstrual period?"       na  Protocols used: Constipation-A-AH

## 2024-03-05 NOTE — Patient Instructions (Addendum)
 Try to drink 55-60 oz of water daily outside of exercise.  Take Metamucil or Benefiber daily.  Try 2 tablespoons of milk of mag in 4 oz of warm prune juice. Do that and wait 2-4 hours. You may repeat. If no improvement, try a Dulcolax suppository and then let me know if we are still having issues.   Sleep Hygiene Tips: Do not watch TV or look at screens within 1 hour of going to bed. If you do, make sure there is a blue light filter (nighttime mode) involved. Try to go to bed around the same time every night. Wake up at the same time within 1 hour of regular time. Ex: If you wake up at 7 AM for work, do not sleep past 8 AM on days that you don't work. Do not drink alcohol before bedtime. Do not consume caffeine-containing beverages after noon or within 9 hours of intended bedtime. Get regular exercise/physical activity in your life, but not within 2 hours of planned bedtime. Do not take naps.  Do not eat within 2 hours of planned bedtime. Melatonin, 3-5 mg 30-60 minutes before planned bedtime may be helpful.  The bed should be for sleep or sex only. If after 20-30 minutes you are unable to fall asleep, get up and do something relaxing. Do this until you feel ready to go to sleep again.   Let us  know if you need anything.

## 2024-03-05 NOTE — Progress Notes (Signed)
 Chief Complaint  Patient presents with   Constipation    Constipation    Subjective: Patient is a 88 y.o. female here for constipation. Here w daughter.   Pt feels balls of stool in rectum over past 5 d. She has been straining in this time and nothing is coming out. Some rectal pain/pressure, lower mid/left abd pain, and is passing some gas. She denies fevers, N/V since this started, bleeding. She has tried an enema and an OTC stool softener.   Past Medical History:  Diagnosis Date   Anemia    Bladder cancer [facility])    urologist--- dr pace   Chronic diastolic (congestive) heart failure (HCC)    followed by dr Audery Blazing   Chronic venous insufficiency    Edema of both lower extremities    per pt wears compression hose   GERD (gastroesophageal reflux disease)    History of cancer chemotherapy    completed 2008 for left breast cancer   History of head and neck radiation 1946   per pt approx 1946 or 1947  (age 30) due to profound bilateral hearing loss told from scarlett fever/ measles,  once weekly for several weeks had  head/ neck radiation,  hearing was restored without neededing hearing aids   History of left breast cancer 2008   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 patel;   2006  s/p left thyroidectomy w/ right inferior parathyoidectomy  (per path speciman marked parathyroid but only thyroid tissue)   Hypertension    IDA (iron deficiency anemia)    hematology/ oncologist--- dr ennever/ sarah carter NP;  treated w/ IV iron infusions   Multinodular thyroid    Neuropathy    mild hands/ feet, uses cane   Nocturia more than twice per night    Nocturnal leg cramps    OA (osteoarthritis)    knees   OAB (overactive bladder)    Osteoporosis    PAF (paroxysmal atrial fibrillation) [facility])     cardiologist--- dr Audery Blazing   PONV (postoperative nausea and vomiting)    Vitamin D  deficiency    Wears glasses     Objective: BP 124/68 (BP Location: Left Arm, Patient Position: Sitting)   Pulse (!) 54   Temp 98 F (36.7 C) (Oral)   Resp 16   Ht 4\' 10"  (1.473 m)   Wt 174 lb (78.9 kg)   SpO2 95%   BMI 36.37 kg/m  General: Awake, appears stated age Heart: RRR, no LE edema Lungs: CTAB, no rales, wheezes or rhonchi. No accessory muscle use Mouth: MMD Abdomen: Bowel sounds present, soft, nondistended, mild TTP in the left lower quadrant and suprapubic region Psych: Age appropriate judgment and insight, normal affect and mood  Assessment and Plan: Constipation, unspecified constipation type  Unlikely to be related to a large or small bowel obstruction.  Hydration encouraged.  Consider daily fiber supplementation.  2 tablespoon of milk of magnesia and 4 ounces of warm prune juice waiting 2 to 4 hours after that.  If no significant improvement with this, recommended trying a suppository. She also brought up that she does not sleep well.  I gave her some sleep hygiene information and will have her follow-up with her regular PCP in the next 1 to 2 months. The patient and her daughter voiced understanding and  agreement to the plan.  Shellie Dials [place], DO 03/05/24  1:51 PM

## 2024-03-20 ENCOUNTER — Encounter: Payer: Self-pay | Admitting: Family Medicine

## 2024-03-20 DIAGNOSIS — K5909 Other constipation: Secondary | ICD-10-CM

## 2024-03-20 NOTE — Telephone Encounter (Signed)
 Called Loretta Gates back- they tried prune juice and milk of magnesia This worked like a Nature conservation officer but they just used it the one time I recommended that they try this again and then try Miralax  every 1-2 days to maintain her bowels  She does not have a GI doctor right now so I will work on a referral for her  Set up an appt for her to see me in about 10 days when I am back from vacation next week

## 2024-03-26 ENCOUNTER — Other Ambulatory Visit: Payer: Self-pay | Admitting: Family Medicine

## 2024-03-26 DIAGNOSIS — N3281 Overactive bladder: Secondary | ICD-10-CM

## 2024-03-27 DIAGNOSIS — M810 Age-related osteoporosis without current pathological fracture: Secondary | ICD-10-CM | POA: Diagnosis not present

## 2024-03-28 NOTE — Progress Notes (Unsigned)
 [facility] [address], [address] [place], [place] [address] [phone]  Date:  04/02/2024   Name:  Loretta Gates   DOB:  November 02, 1935   MRN:  [id number]  PCP:  Kaylee Partridge, MD    Chief Complaint: No chief complaint on file.   History of Present Illness:  Loretta Gates is a 88 y.o. very pleasant female patient who presents with the following:  Pt seen today with concern of constipation  Last seen by myself in April- she also saw my partner Dr Gwenette Lennox about a month ago with constipation  History of heart failure, atrial fibrillation on Eliquis  and metoprolol  CVA 02/2023  Per notes from Dr Gwenette Lennox:  Livia Riffle to be related to a large or small bowel obstruction.  Hydration encouraged.  Consider daily fiber supplementation.  2 tablespoon of milk of magnesia and 4 ounces of warm prune juice waiting 2 to 4 hours after that.  If no significant improvement with this, recommended trying a suppository. She also brought up that she does not sleep well.  I gave her some sleep hygiene information and will have her follow-up with her regular PCP in the next 1 to 2 months. The patient and her daughter voiced understanding and agreement to the plan.    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   ICAO (internal carotid artery occlusion), left 02/06/2023   Chest pain 12/08/2022   Class 3 obesity 10/21/2022   Acute on chronic diastolic CHF (congestive heart failure) (HCC) 10/20/2022   IDA (iron deficiency anemia) 01/13/2022   Atrial fibrillation (HCC) 01/05/2022   Breast cancer (HCC) 01/05/2022   Primary hyperparathyroidism (HCC) 01/05/2022   Essential hypertension 01/05/2022   Age-related nuclear cataract of both eyes 12/11/2018   Abnormal INR 01/29/2018   Long term (current) use of  anticoagulants 01/29/2018   Encounter for therapeutic drug monitoring 01/29/2018   Vitamin D  deficiency 05/03/2017   S/P laparoscopic cholecystectomy 02/26/2017   Calculus of gallbladder without cholecystitis without obstruction 02/23/2017   Diarrhea of presumed infectious origin 02/23/2017   Hyperglycemia 02/23/2017   Right upper quadrant abdominal pain 02/23/2017   Lower extremity edema 01/18/2016   Age-related osteoporosis without current pathological fracture 01/17/2016   Diverticulosis of large intestine without hemorrhage 01/17/2016   GERD (gastroesophageal reflux disease) 01/17/2016   History of left breast cancer 01/17/2016   Multinodular goiter 01/17/2016   OAB (overactive bladder) 01/17/2016   History of cancer chemotherapy 03/06/2015   History of left mastectomy 03/06/2015   Malignant neoplasm of overlapping sites of left female breast (HCC) 03/06/2015    Past Medical History:  Diagnosis Date   Anemia    Bladder cancer [facility])    urologist--- dr pace   Chronic diastolic (congestive) heart failure (HCC)    followed by dr Audery Blazing   Chronic venous insufficiency    Edema of both lower extremities    per pt wears compression hose   GERD (gastroesophageal reflux disease)    History of cancer chemotherapy    completed 2008 for left breast cancer   History of head and neck radiation 1946   per pt approx 1946 or 1947  (age 32) due to profound bilateral hearing loss told from scarlett fever/ measles,  once weekly for several weeks had  head/ neck radiation,  hearing was restored without neededing hearing aids   History of left breast cancer 2008   malignant neoplasm of overlapping sites of left breast, ER+;  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 patel;   2006  s/p left thyroidectomy w/ right inferior parathyoidectomy  (per path speciman marked  parathyroid but only thyroid tissue)   Hypertension    IDA (iron deficiency anemia)    hematology/ oncologist--- dr ennever/ sarah carter NP;  treated w/ IV iron infusions   Multinodular thyroid    Neuropathy    mild hands/ feet, uses cane   Nocturia more than twice per night    Nocturnal leg cramps    OA (osteoarthritis)    knees   OAB (overactive bladder)    Osteoporosis    PAF (paroxysmal atrial fibrillation) [facility])    cardiologist--- dr Audery Blazing   PONV (postoperative nausea and vomiting)    Vitamin D  deficiency    Wears glasses     Past Surgical History:  Procedure Laterality Date   BREAST LUMPECTOMY WITH RADIOACTIVE SEED LOCALIZATION Right 10/25/2021   Procedure: RIGHT BREAST LUMPECTOMY WITH RADIOACTIVE SEED LOCALIZATION;  Surgeon: Sim Dryer, MD;  Location: [facility];  Service: General;  Laterality: Right;   CARDIOVERSION N/A 01/01/2023   Procedure: CARDIOVERSION;  Surgeon: Lenise Quince, MD;  Location: [facility] ENDOSCOPY;  Service: Cardiovascular;  Laterality: N/A;   CHOLECYSTECTOMY, LAPAROSCOPIC  02/25/2017   @[facility]    COLONOSCOPY WITH ESOPHAGOGASTRODUODENOSCOPY (EGD)  2022   IR ANGIO INTRA EXTRACRAN SEL COM CAROTID INNOMINATE UNI R MOD SED  02/07/2023   IR ANGIO VERTEBRAL SEL SUBCLAVIAN INNOMINATE UNI L MOD SED  02/07/2023   IR CT HEAD LTD  02/07/2023   IR INTRAVSC STENT CERV CAROTID W/O EMB-PROT MOD SED INC ANGIO  02/07/2023   IR PERCUTANEOUS ART THROMBECTOMY/INFUSION INTRACRANIAL INC DIAG ANGIO  02/07/2023   IR RADIOLOGIST EVAL & MGMT  03/10/2023   MASTECTOMY WITH AXILLARY LYMPH NODE DISSECTION Left 03/05/2007   @ [facility]   OVARIAN CYST SURGERY     age 70;   abdominal   RADIOLOGY WITH ANESTHESIA N/A 02/06/2023   Procedure: IR WITH ANESTHESIA;  Surgeon: Radiologist, Medication, MD;  Location: [facility] OR;  Service: Radiology;  Laterality: N/A;   THYROIDECTOMY, PARTIAL Left 2006   left lobectomy and right infertior parathyroidectomy   TONSILLECTOMY     child     Social History   Tobacco Use   Smoking status: Never   Smokeless tobacco: Never  Vaping Use   Vaping status: Never Used  Substance Use Topics   Alcohol use: Never   Drug use: Never    Family History  Problem Relation Age of Onset   Sudden death Neg Hx     Allergies  Allergen Reactions   Bee Venom Swelling    Bee stung her on the inside of her gum. Lips and gums started swelling. No SOB.   Levaquin [Levofloxacin] Hives and Itching   Peanut-Containing Drug Products Other (See Comments)    Stomach pain and diarrhea     Medication list has been reviewed and updated.  Current Outpatient Medications on File Prior to Visit  Medication Sig Dispense Refill   acetaminophen  (TYLENOL ) 325 MG tablet Take 2 tablets (650 mg total) by mouth every 4 (four) hours as needed for mild pain (or temp > 37.5 C (99.5 F)).  apixaban  (ELIQUIS ) 5 MG TABS tablet TAKE ONE TABLET BY MOUTH TWICE A DAY 180 tablet 3   cholestyramine  (QUESTRAN ) 4 g packet Take 1 packet (4 g total) by mouth daily at 6 PM. 90 packet 3   cinacalcet  (SENSIPAR ) 30 MG tablet Take 1 tablet (30 mg total) by mouth daily with breakfast. 30 tablet 0   empagliflozin  (JARDIANCE ) 10 MG TABS tablet Take 1 tablet (10 mg total) by mouth daily. 90 tablet 3   ergocalciferol  (VITAMIN D2) 1.25 MG (50000 UT) capsule Take 50,000 Units by mouth every 14 (fourteen) days. Every other saturday     furosemide  (LASIX ) 20 MG tablet Take 3 tablets (60 mg total) by mouth daily. Take an extra 20 mg as needed for swelling 270 tablet 3   magnesium  oxide (MAG-OX) 400 (240 Mg) MG tablet Take 1 tablet (400 mg total) by mouth daily. 90 tablet 0   metoprolol  tartrate (LOPRESSOR ) 50 MG tablet TAKE ONE AND ONE-HALF TABLETS BY MOUTH TWICE A DAY 270 tablet 3   omeprazole  (PRILOSEC) 40 MG capsule TAKE ONE CAPSULE BY MOUTH TWICE A DAY BEFORE A MEAL 180 capsule 1   ondansetron  (ZOFRAN ) 4 MG tablet Take 1 tablet (4 mg total) by mouth every 8 (eight) hours as needed  for nausea or vomiting. 20 tablet 0   oxybutynin  (DITROPAN -XL) 10 MG 24 hr tablet Take 1 tablet (10 mg total) by mouth in the morning and at bedtime. 180 tablet 0   potassium chloride  SA (KLOR-CON  M) 20 MEQ tablet Take 1 tablet (20 mEq total) by mouth once for 1 dose. TAKE ONE TABLET BY MOUTH DAILY - USE WHEN TAKING MORE THAN 20 MG OF FUROSEMIDE  DAILY 90 tablet 3   ticagrelor  (BRILINTA ) 90 MG TABS tablet Take 0.5 tablets (45 mg total) by mouth 2 (two) times daily. 90 tablet 3   No current facility-administered medications on file prior to visit.    Review of Systems:  As per HPI- otherwise negative.   Physical Examination: There were no vitals filed for this visit. There were no vitals filed for this visit. There is no height or weight on file to calculate BMI. Ideal Body Weight:    GEN: no acute distress. HEENT: Atraumatic, Normocephalic.  Ears and Nose: No external deformity. CV: RRR, No M/G/R. No JVD. No thrill. No extra heart sounds. PULM: CTA B, no wheezes, crackles, rhonchi. No retractions. No resp. distress. No accessory muscle use. ABD: S, NT, ND, +BS. No rebound. No HSM. EXTR: No c/c/e PSYCH: Normally interactive. Conversant.    Assessment and Plan: ***  Signed Gates Kasal, MD

## 2024-04-02 ENCOUNTER — Ambulatory Visit (HOSPITAL_BASED_OUTPATIENT_CLINIC_OR_DEPARTMENT_OTHER)
Admission: RE | Admit: 2024-04-02 | Discharge: 2024-04-02 | Disposition: A | Discharge status: Home / Self Care | Source: Ambulatory Visit | Attending: Family Medicine | Admitting: Family Medicine

## 2024-04-02 ENCOUNTER — Ambulatory Visit (INDEPENDENT_AMBULATORY_CARE_PROVIDER_SITE_OTHER): Admitting: Family Medicine

## 2024-04-02 ENCOUNTER — Encounter: Payer: Self-pay | Admitting: Family Medicine

## 2024-04-02 VITALS — BP 114/68 | HR 83 | Ht <= 58 in | Wt 171.0 lb

## 2024-04-02 DIAGNOSIS — K5900 Constipation, unspecified: Secondary | ICD-10-CM

## 2024-04-02 DIAGNOSIS — I700 Atherosclerosis of aorta: Secondary | ICD-10-CM | POA: Diagnosis not present

## 2024-04-02 DIAGNOSIS — I771 Stricture of artery: Secondary | ICD-10-CM | POA: Diagnosis not present

## 2024-04-02 NOTE — Patient Instructions (Signed)
 Good to see you today!  I will be in touch with your x-ray reports asap  If you have a few (less than about 4) loose stools in a day don't take the imodium as I am afraid this is leading to constipation later!  Reserve Imodium for more severe diarrhea

## 2024-04-08 ENCOUNTER — Ambulatory Visit: Admitting: Gastroenterology

## 2024-04-08 ENCOUNTER — Encounter: Payer: Self-pay | Admitting: Gastroenterology

## 2024-04-08 ENCOUNTER — Other Ambulatory Visit: Payer: Self-pay | Admitting: Cardiology

## 2024-04-08 VITALS — BP 110/60 | HR 84 | Ht <= 58 in | Wt 170.5 lb

## 2024-04-08 DIAGNOSIS — K5909 Other constipation: Secondary | ICD-10-CM

## 2024-04-08 DIAGNOSIS — R110 Nausea: Secondary | ICD-10-CM | POA: Diagnosis not present

## 2024-04-08 DIAGNOSIS — R600 Localized edema: Secondary | ICD-10-CM

## 2024-04-08 NOTE — Patient Instructions (Addendum)
   Nausea -otc ginger chews for nausea -take night time medications with/after dinner, not on empty stomach -omeprazole  40mg  po daily 30-45 minutes before dinner  Constipation  -OTC Benefiber po daily -OTC MOM with prune juice 1/2-1 dose every other day , if too much diarrhea move it out to every 3 days Can increase dose if more constipation.  _______________________________________________________  If your blood pressure at your visit was 140/90 or greater, please contact your primary care physician to follow up on this.  _______________________________________________________  If you are age 70 or older, your body mass index should be between 23-30. Your Body mass index is 35.63 kg/m. If this is out of the aforementioned range listed, please consider follow up with your Primary Care Provider.  If you are age 77 or younger, your body mass index should be between 19-25. Your Body mass index is 35.63 kg/m. If this is out of the aformentioned range listed, please consider follow up with your Primary Care Provider.   ________________________________________________________  The [facility] providers would like to encourage you to use MYCHART to communicate with providers for non-urgent requests or questions.  Due to long hold times on the telephone, sending your provider a message by [facility] may be a faster and more efficient way to get a response.  Please allow 48 business hours for a response.  Please remember that this is for non-urgent requests.  _______________________________________________________   Thank you for trusting me with your gastrointestinal care. Deanna May, RNP

## 2024-04-08 NOTE — Progress Notes (Signed)
 Chief Complaint:chronic constipation, nausea Primary GI Doctor: Dr. Federico  HPI:  Patient is a  88  year old female patient with past medical history of GERD, stroke (2024), hypertension,who was referred to me by Copland, Harlene BROCKS, MD on 03/20/24 for a complaint of chronic constipation .    Interval History     Patient presents for evaluation of chronic constipation and nausea. Accompanied by her daughter. They report her symptoms seemed to have worsened over the past year after she had a stroke. She states she takes Benefiber po daily. She uses stool softeners and OTC Miralax  prn. She has bowel movement every 2-3 days where she Costella Schwarz have small amount or large amount come out      She reports nausea first thing in the morning. Her daughter tells me she takes three medications right before bedtime on empty stomach; omeprazole , metoprolol , and oxybutynin . She reports good appetite. She will take OTC Pepto Bismul for nausea which helps some.   Occasional NSAID's.  She has intermittent GERD that is controlled with Omeprazole  40 mg twice daily. No dysphagia.  Patient taking Eliquis  5mg  twice daily  Patients last colonoscopy approximately 6 years or more ago and per patient normal.  Patient and husband live with her daughter. Patient ambulates with walker.  Wt Readings from Last 3 Encounters:  04/08/24 170 lb 8 oz (77.3 kg)  04/02/24 171 lb (77.6 kg)  03/05/24 174 lb (78.9 kg)     Past Medical History:  Diagnosis Date   Anemia    Bladder cancer [facility])    urologist--- dr pace   Chronic diastolic (congestive) heart failure (HCC)    followed by dr pietro   Chronic venous insufficiency    Edema of both lower extremities    per pt wears compression hose   GERD (gastroesophageal reflux disease)    History of cancer chemotherapy    completed 2008 for left breast cancer   History of head and neck radiation 1946   per pt approx 1946 or 1947  (age 61) due to profound bilateral hearing loss  told from scarlett fever/ measles,  once weekly for several weeks had  head/ neck radiation,  hearing was restored without neededing hearing aids   History of left breast cancer 2008   malignant neoplasm of overlapping sites of left breast, ER+;  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 patel;   2006  s/p left thyroidectomy w/ right inferior parathyoidectomy  (per path speciman marked parathyroid but only thyroid tissue)   Hypertension    IDA (iron deficiency anemia)    hematology/ oncologist--- dr ennever/ sarah carter NP;  treated w/ IV iron infusions   Multinodular thyroid    Neuropathy    mild hands/ feet, uses cane   Nocturia more than twice per night    Nocturnal leg cramps    OA (osteoarthritis)    knees   OAB (overactive bladder)    Osteoporosis    PAF (paroxysmal atrial fibrillation) [facility])    cardiologist--- dr pietro   PONV (postoperative nausea and vomiting)    Vitamin D  deficiency    Wears glasses     Past Surgical History:  Procedure Laterality Date   BREAST LUMPECTOMY WITH RADIOACTIVE SEED LOCALIZATION Right 10/25/2021   Procedure: RIGHT BREAST LUMPECTOMY WITH RADIOACTIVE SEED LOCALIZATION;  Surgeon: Vanderbilt Ned, MD;  Location: [facility];  Service: General;  Laterality: Right;   CARDIOVERSION N/A 01/01/2023   Procedure: CARDIOVERSION;  Surgeon: Pietro Redell RAMAN, MD;  Location: [facility] ENDOSCOPY;  Service: Cardiovascular;  Laterality: N/A;   CHOLECYSTECTOMY, LAPAROSCOPIC  02/25/2017   @[facility]    COLONOSCOPY WITH ESOPHAGOGASTRODUODENOSCOPY (EGD)  2022   IR ANGIO INTRA EXTRACRAN SEL COM CAROTID INNOMINATE UNI R MOD SED  02/07/2023   IR ANGIO VERTEBRAL SEL SUBCLAVIAN INNOMINATE UNI L MOD SED  02/07/2023   IR CT HEAD LTD  02/07/2023   IR INTRAVSC STENT CERV CAROTID W/O EMB-PROT MOD SED INC ANGIO  02/07/2023   IR PERCUTANEOUS ART  THROMBECTOMY/INFUSION INTRACRANIAL INC DIAG ANGIO  02/07/2023   IR RADIOLOGIST EVAL & MGMT  03/10/2023   MASTECTOMY WITH AXILLARY LYMPH NODE DISSECTION Left 03/05/2007   @ [facility]   OVARIAN CYST SURGERY     age 52;   abdominal   RADIOLOGY WITH ANESTHESIA N/A 02/06/2023   Procedure: IR WITH ANESTHESIA;  Surgeon: Radiologist, Medication, MD;  Location: [facility] OR;  Service: Radiology;  Laterality: N/A;   THYROIDECTOMY, PARTIAL Left 2006   left lobectomy and right infertior parathyroidectomy   TONSILLECTOMY     child    Current Outpatient Medications  Medication Sig Dispense Refill   acetaminophen  (TYLENOL ) 325 MG tablet Take 2 tablets (650 mg total) by mouth every 4 (four) hours as needed for mild pain (or temp > 37.5 C (99.5 F)).     apixaban  (ELIQUIS ) 5 MG TABS tablet TAKE ONE TABLET BY MOUTH TWICE A DAY 180 tablet 3   cinacalcet  (SENSIPAR ) 30 MG tablet Take 1 tablet (30 mg total) by mouth daily with breakfast. 30 tablet 0   empagliflozin  (JARDIANCE ) 10 MG TABS tablet Take 1 tablet (10 mg total) by mouth daily. 90 tablet 3   ergocalciferol  (VITAMIN D2) 1.25 MG (50000 UT) capsule Take 50,000 Units by mouth every 14 (fourteen) days. Every other saturday     furosemide  (LASIX ) 20 MG tablet Take 3 tablets (60 mg total) by mouth daily. Take an extra 20 mg as needed for swelling 270 tablet 3   magnesium  oxide (MAG-OX) 400 (240 Mg) MG tablet Take 1 tablet (400 mg total) by mouth daily. 90 tablet 0   metoprolol  tartrate (LOPRESSOR ) 50 MG tablet TAKE ONE AND ONE-HALF TABLETS BY MOUTH TWICE A DAY 270 tablet 3   omeprazole  (PRILOSEC) 40 MG capsule TAKE ONE CAPSULE BY MOUTH TWICE A DAY BEFORE A MEAL 180 capsule 1   oxybutynin  (DITROPAN -XL) 10 MG 24 hr tablet Take 1 tablet (10 mg total) by mouth in the morning and at bedtime. 180 tablet 0   potassium chloride  SA (KLOR-CON  M) 20 MEQ tablet Take 1 tablet (20 mEq total) by mouth once for 1 dose. TAKE ONE TABLET BY MOUTH DAILY - USE WHEN TAKING MORE THAN 20 MG OF  FUROSEMIDE  DAILY 90 tablet 3   ticagrelor  (BRILINTA ) 90 MG TABS tablet Take 0.5 tablets (45 mg total) by mouth 2 (two) times daily. 90 tablet 3   cholestyramine  (QUESTRAN ) 4 g packet Take 1 packet (4 g total) by mouth daily at 6 PM. (Patient not taking: Reported on 04/08/2024) 90 packet 3   No current facility-administered medications for this visit.    Allergies as of 04/08/2024 - Review Complete 04/08/2024  Allergen Reaction Noted   Bee venom Swelling 10/19/2021   Levaquin [levofloxacin] Hives and Itching 10/19/2021   Peanut-containing drug products Other (See Comments) 10/20/2022    Family History  Problem Relation Age of  Onset   Hypertension Mother    Hypertension Father    Hypertension Daughter    Hypertension Son    Sudden death Neg Hx     Review of Systems:    Constitutional: No weight loss, fever, chills, weakness or fatigue HEENT: Eyes: No change in vision               Ears, Nose, Throat:  No change in hearing or congestion Skin: No rash or itching Cardiovascular: No chest pain, chest pressure or palpitations   Respiratory: No SOB or cough Gastrointestinal: See HPI and otherwise negative Genitourinary: No dysuria or change in urinary frequency Neurological: No headache, dizziness or syncope Musculoskeletal: No new muscle or joint pain Hematologic: No bleeding or bruising Psychiatric: No history of depression or anxiety    Physical Exam:  Vital signs: BP 110/60 (BP Location: Right Arm, Patient Position: Sitting, Cuff Size: Large)   Pulse 84 Comment: irregular  Ht 4' 10 (1.473 m) Comment: from previous height  Wt 170 lb 8 oz (77.3 kg)   BMI 35.63 kg/m   Constitutional:   Pleasant  female appears to be in NAD, Well developed, Well nourished, alert and cooperative Throat: Oral cavity and pharynx without inflammation, swelling or lesion.  Respiratory: Respirations even and unlabored. Lungs clear to auscultation bilaterally.   No wheezes, crackles, or rhonchi.   Cardiovascular: Normal S1, S2. Regular rate and rhythm. No peripheral edema, cyanosis or pallor.  Gastrointestinal:  Soft, nondistended, nontender. No rebound or guarding. Normal bowel sounds. No appreciable masses or hepatomegaly. Rectal:  Not performed.  Msk:  Symmetrical without gross deformities. Without edema, no deformity or joint abnormality.  Neurologic:  Alert and  oriented x4;  grossly normal neurologically.  Skin:   Dry and intact without significant lesions or rashes. Psychiatric: Oriented to person, place and time. Demonstrates good judgement and reason without abnormal affect or behaviors.  RELEVANT LABS AND IMAGING: CBC    Latest Ref Rng & Units 12/26/2023   11:24 AM 05/02/2023   10:48 AM 03/26/2023    4:11 PM  CBC  WBC 3.4 - 10.8 x10E3/uL 8.9  8.4  11.1   Hemoglobin 11.1 - 15.9 g/dL 86.9  87.2  88.0   Hematocrit 34.0 - 46.6 % 40.2  41.0  37.2   Platelets 150 - 450 x10E3/uL 291  322.0  273      CMP     Latest Ref Rng & Units 12/26/2023   11:24 AM 08/01/2023    9:54 AM 05/02/2023   10:48 AM  CMP  Glucose 70 - 99 mg/dL 87  87  864   BUN 8 - 27 mg/dL 24  23  20    Creatinine 0.57 - 1.00 mg/dL 9.10  8.98  9.13   Sodium 134 - 144 mmol/L 146  142  141   Potassium 3.5 - 5.2 mmol/L 3.9  3.7  3.7   Chloride 96 - 106 mmol/L 103  101  103   CO2 20 - 29 mmol/L 26  31  25    Calcium 8.7 - 10.3 mg/dL 9.3  9.1  9.1   Total Protein 6.0 - 8.3 g/dL  7.0    Total Bilirubin 0.2 - 1.2 mg/dL  0.9    Alkaline Phos 39 - 117 U/L  57    AST 0 - 37 U/L  16    ALT 0 - 35 U/L  17       Lab Results  Component Value Date   TSH 2.520  05/15/2023   04/02/23 Abd xray 2 views- Moderate colonic stool burden diffusely throughout the colon.Nonobstructive bowel gas pattern.  10/21/22 echo- Left ventricular ejection fraction, by estimation, is 55 to 60%.   Assessment: Encounter Diagnoses  Name Primary?   Chronic constipation Yes   Nausea without vomiting      88 year old female patient who  presents with constipation and nausea.  We spent several minutes discussing regimen for patient's constipation.  Reinforced daily Benefiber with plenty of fluids and high-fiber diet.  We will add over-the-counter milk of magnesium  with prune juice half to full dose every other day and if this causes too much volume we can decrease it to every 3 days.  Patient experiences obstipation if she goes too long without bowel movement and has abdominal discomfort with diarrhea.  I discussed with her daughter that she can titrate the medications as needed. For the nausea we discussed taking her omeprazole  before breakfast and dinner versus after as well as taking her other 2 medications with food as they both can cause nausea.  I also suspect that some of the constipation issues Carson Meche also cause some of the nausea.  We will make these changes and reevaluate at follow-up.  Patient can use over-the-counter ginger chews as needed for nausea.  Plan: -otc ginger chews -take night time medications with dinner -omeprazole  40mg  po daily 30-45 minutes before dinner not after -OTC Benefiber po daily -OTC MOM with prune juice 1/2-1 dose every other day -Follow-up 3-51mths with me  Thank you for the courtesy of this consult. Please call me with any questions or concerns.   Odie Rauen, FNP-C [facility] 04/08/2024, 10:03 AM  Cc: Copland, Harlene BROCKS, MD

## 2024-04-08 NOTE — Progress Notes (Signed)
 I agree with the assessment and plan as outlined by Ms. May.

## 2024-04-09 ENCOUNTER — Ambulatory Visit: Payer: Self-pay

## 2024-04-09 NOTE — Telephone Encounter (Signed)
 FYI Only or Action Required?: FYI only for provider.  Patient was last seen in primary care on 04/02/2024 by Copland, Harlene BROCKS, MD. Called Nurse Triage reporting Lip Laceration. Symptoms began today. Interventions attempted: Other: direct pressure. Symptoms are: unchanged.  Triage Disposition: Go to ED Now (Notify PCP)  Patient/caregiver understands and will follow disposition?: Yes  Copied from CRM [id number]. Topic: Clinical - Red Word Triage >> Apr 09, 2024  3:35 PM Thersia C wrote: [facility] that prompted transfer to Nurse Triage:  Patient husband called in stated patient bit her tongue very badly, bleeding very bad, needs stitches done and is on blood thinner Reason for Disposition  [1] Bleeding AND [2] won't stop after 10 minutes of direct pressure (using correct technique)  Answer Assessment - Initial Assessment Questions 1. MECHANISM: How did the injury happen?      Bit tongue 2. ONSET: When did the injury happen? (Minutes or hours ago)      today 3. LOCATION: What part of the mouth is injured?      tongue 4. APPEARANCE of INJURY: What does the mouth look like?      bleeding 5. BLEEDING: Is the mouth still bleeding? If Yes, ask: Is it difficult to stop?      Yes with direct pressure 6. OTHER SYMPTOMS: Are you having any trouble breathing?    Denies.   Additional info:  1) Seen by dental they were unable to help and stated she may need a stitch to repair, they do not have capability to do this repair. Holding pressure with gauze.  2) On blood thinners.  Protocols used: Mouth Injury-A-AH

## 2024-04-21 ENCOUNTER — Encounter: Payer: Self-pay | Admitting: Family Medicine

## 2024-04-21 ENCOUNTER — Other Ambulatory Visit: Payer: Self-pay | Admitting: Family Medicine

## 2024-04-21 DIAGNOSIS — R300 Dysuria: Secondary | ICD-10-CM

## 2024-04-21 MED ORDER — CEPHALEXIN 500 MG PO CAPS: 500.0000 mg | ORAL_CAPSULE | Freq: Two times a day (BID) | ORAL | 0 refills | Status: DC

## 2024-04-21 NOTE — Telephone Encounter (Signed)

## 2024-04-22 ENCOUNTER — Other Ambulatory Visit

## 2024-04-22 DIAGNOSIS — R300 Dysuria: Secondary | ICD-10-CM | POA: Diagnosis not present

## 2024-04-23 LAB — URINE CULTURE
MICRO NUMBER:: 16700562
SPECIMEN QUALITY:: ADEQUATE

## 2024-04-24 NOTE — Telephone Encounter (Signed)
 Appt scheduled tomorrow, 04/25/24 w/ Leita

## 2024-04-25 ENCOUNTER — Ambulatory Visit (INDEPENDENT_AMBULATORY_CARE_PROVIDER_SITE_OTHER): Admitting: Family

## 2024-04-25 ENCOUNTER — Ambulatory Visit: Payer: Self-pay | Admitting: Family

## 2024-04-25 ENCOUNTER — Encounter: Payer: Self-pay | Admitting: Family

## 2024-04-25 ENCOUNTER — Ambulatory Visit (HOSPITAL_BASED_OUTPATIENT_CLINIC_OR_DEPARTMENT_OTHER)
Admission: RE | Admit: 2024-04-25 | Discharge: 2024-04-25 | Disposition: A | Discharge status: Home / Self Care | Source: Ambulatory Visit | Attending: Family | Admitting: Family

## 2024-04-25 VITALS — BP 114/62 | HR 60 | Ht <= 58 in

## 2024-04-25 DIAGNOSIS — R3915 Urgency of urination: Secondary | ICD-10-CM

## 2024-04-25 DIAGNOSIS — K429 Umbilical hernia without obstruction or gangrene: Secondary | ICD-10-CM | POA: Diagnosis not present

## 2024-04-25 DIAGNOSIS — K5909 Other constipation: Secondary | ICD-10-CM

## 2024-04-25 DIAGNOSIS — R109 Unspecified abdominal pain: Secondary | ICD-10-CM | POA: Diagnosis not present

## 2024-04-25 DIAGNOSIS — R1084 Generalized abdominal pain: Secondary | ICD-10-CM | POA: Insufficient documentation

## 2024-04-25 DIAGNOSIS — K7460 Unspecified cirrhosis of liver: Secondary | ICD-10-CM | POA: Diagnosis not present

## 2024-04-25 NOTE — Progress Notes (Signed)
 Loretta Gates is a 88 y.o. female with the following history as recorded in EpicCare:  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   ICAO (internal carotid artery occlusion), left 02/06/2023   Chest pain 12/08/2022   Class 3 obesity 10/21/2022   Acute on chronic diastolic CHF (congestive heart failure) (HCC) 10/20/2022   IDA (iron deficiency anemia) 01/13/2022   Atrial fibrillation (HCC) 01/05/2022   Breast cancer (HCC) 01/05/2022   Primary hyperparathyroidism (HCC) 01/05/2022   Essential hypertension 01/05/2022   Age-related nuclear cataract of both eyes 12/11/2018   Abnormal INR 01/29/2018   Long term (current) use of anticoagulants 01/29/2018   Encounter for therapeutic drug monitoring 01/29/2018   Vitamin D  deficiency 05/03/2017   S/P laparoscopic cholecystectomy 02/26/2017   Calculus of gallbladder without cholecystitis without obstruction 02/23/2017   Diarrhea of presumed infectious origin 02/23/2017   Hyperglycemia 02/23/2017   Right upper quadrant abdominal pain 02/23/2017   Lower extremity edema 01/18/2016   Age-related osteoporosis without current pathological fracture 01/17/2016   Diverticulosis of large intestine without hemorrhage 01/17/2016   GERD (gastroesophageal reflux disease) 01/17/2016   History of left breast cancer 01/17/2016   Multinodular goiter 01/17/2016   OAB (overactive bladder) 01/17/2016   History of cancer chemotherapy 03/06/2015   History of left mastectomy 03/06/2015   Malignant neoplasm of overlapping sites of left female breast (HCC) 03/06/2015    Current Outpatient Medications  Medication Sig Dispense Refill   acetaminophen  (TYLENOL ) 325 MG tablet Take 2 tablets (650 mg total) by mouth every 4 (four) hours as needed for mild pain (or temp > 37.5 C (99.5 F)).     apixaban  (ELIQUIS ) 5 MG TABS tablet  TAKE ONE TABLET BY MOUTH TWICE A DAY 180 tablet 3   cephALEXin  (KEFLEX ) 500 MG capsule Take 1 capsule (500 mg total) by mouth 2 (two) times daily. 14 capsule 0   cinacalcet  (SENSIPAR ) 30 MG tablet Take 1 tablet (30 mg total) by mouth daily with breakfast. 30 tablet 0   empagliflozin  (JARDIANCE ) 10 MG TABS tablet Take 1 tablet (10 mg total) by mouth daily. 90 tablet 3   ergocalciferol  (VITAMIN D2) 1.25 MG (50000 UT) capsule Take 50,000 Units by mouth every 14 (fourteen) days. Every other saturday     furosemide  (LASIX ) 20 MG tablet TAKE THREE TABLETS BY MOUTH ONE TIME DAILY , TAKE AN EXTRA TABLET AS NEEDED FOR SWELLING 270 tablet 2   magnesium  oxide (MAG-OX) 400 (240 Mg) MG tablet Take 1 tablet (400 mg total) by mouth daily. 90 tablet 0   metoprolol  tartrate (LOPRESSOR ) 50 MG tablet TAKE ONE AND ONE-HALF TABLETS BY MOUTH TWICE A DAY 270 tablet 3   omeprazole  (PRILOSEC) 40 MG capsule TAKE ONE CAPSULE BY MOUTH TWICE A DAY BEFORE A MEAL 180 capsule 1   oxybutynin  (DITROPAN -XL) 10 MG 24 hr tablet Take 1 tablet (10 mg total) by mouth in the morning and at bedtime. 180 tablet 0   potassium chloride  SA (KLOR-CON  M) 20 MEQ tablet Take 1 tablet (20 mEq total) by mouth once for 1 dose. TAKE ONE TABLET BY MOUTH DAILY - USE WHEN TAKING MORE THAN 20 MG OF FUROSEMIDE  DAILY 90 tablet 3   ticagrelor  (BRILINTA ) 90 MG TABS tablet Take 0.5 tablets (45 mg total) by mouth 2 (two) times daily. 90 tablet 3   cholestyramine  (QUESTRAN ) 4 g packet  Take 1 packet (4 g total) by mouth daily at 6 PM. (Patient not taking: Reported on 04/25/2024) 90 packet 3   No current facility-administered medications for this visit.    Allergies: Bee venom, Levaquin [levofloxacin], and Peanut-containing drug products  Past Medical History:  Diagnosis Date   Anemia    Bladder cancer [facility])    urologist--- dr pace   Chronic diastolic (congestive) heart failure (HCC)    followed by dr pietro   Chronic venous insufficiency    Edema of both  lower extremities    per pt wears compression hose   GERD (gastroesophageal reflux disease)    History of cancer chemotherapy    completed 2008 for left breast cancer   History of head and neck radiation 1946   per pt approx 1946 or 1947  (age 19) due to profound bilateral hearing loss told from scarlett fever/ measles,  once weekly for several weeks had  head/ neck radiation,  hearing was restored without neededing hearing aids   History of left breast cancer 2008   malignant neoplasm of overlapping sites of left breast, ER+;  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 patel;   2006  s/p left thyroidectomy w/ right inferior parathyoidectomy  (per path speciman marked parathyroid but only thyroid tissue)   Hypertension    IDA (iron deficiency anemia)    hematology/ oncologist--- dr ennever/ sarah carter NP;  treated w/ IV iron infusions   Multinodular thyroid    Neuropathy    mild hands/ feet, uses cane   Nocturia more than twice per night    Nocturnal leg cramps    OA (osteoarthritis)    knees   OAB (overactive bladder)    Osteoporosis    PAF (paroxysmal atrial fibrillation) [facility])    cardiologist--- dr pietro   PONV (postoperative nausea and vomiting)    Vitamin D  deficiency    Wears glasses     Past Surgical History:  Procedure Laterality Date   BREAST LUMPECTOMY WITH RADIOACTIVE SEED LOCALIZATION Right 10/25/2021   Procedure: RIGHT BREAST LUMPECTOMY WITH RADIOACTIVE SEED LOCALIZATION;  Surgeon: Vanderbilt Ned, MD;  Location: [facility];  Service: General;  Laterality: Right;   CARDIOVERSION N/A 01/01/2023   Procedure: CARDIOVERSION;  Surgeon: pietro Redell RAMAN, MD;  Location: [facility] END[facility]COPY;  Service: Cardiovascular;  Laterality: N/A;   CHOLECYSTECTOMY, LAPAROSCOPIC  02/25/2017   @[facility]    COLONOSCOPY WITH ESOPHAGOGASTRODUODENOSCOPY  (EGD)  2022   IR ANGIO INTRA EXTRACRAN SEL COM CAROTID INNOMINATE UNI R MOD SED  02/07/2023   IR ANGIO VERTEBRAL SEL SUBCLAVIAN INNOMINATE UNI L MOD SED  02/07/2023   IR CT HEAD LTD  02/07/2023   IR INTRAVSC STENT CERV CAROTID W/O EMB-PROT MOD SED INC ANGIO  02/07/2023   IR PERCUTANEOUS ART THROMBECTOMY/INFUSION INTRACRANIAL INC DIAG ANGIO  02/07/2023   IR RADIOLOGIST EVAL & MGMT  03/10/2023   MASTECTOMY WITH AXILLARY LYMPH NODE DISSECTION Left 03/05/2007   @ [facility]   OVARIAN CYST SURGERY     age 55;   abdominal   RADIOLOGY WITH ANESTHESIA N/A 02/06/2023   Procedure: IR WITH ANESTHESIA;  Surgeon: Radiologist, Medication, MD;  Location: [facility] OR;  Service: Radiology;  Laterality: N/A;   THYROIDECTOMY, PARTIAL Left 2006   left lobectomy and right infertior parathyroidectomy   TONSILLECTOMY     child    Family History  Problem Relation Age of Onset   Hypertension Mother    Hypertension Father    Hypertension Daughter    Hypertension Son    Sudden death Neg Hx     Social History   Tobacco Use   Smoking status: Former    Current packs/day: 0.00    Types: Cigarettes    Quit date: 1969    Years since quitting: 56.5   Smokeless tobacco: Never  Substance Use Topics   Alcohol use: Never    Subjective:   Patient had called earlier this week with concerns for right lower quadrant pain/ urinary discomfort/ urgency- was concerned that had UTI but culture did not show any abnormality;  Has been in with concerns for chronic constipation on at least 3 different occasions in the past 2 months; did recently meet with GI and discussed daily regimen but does not feel as effective as had hoped it would be;  No fever, blood in urine or blood in stool;  Does take Lasix  and Jardiance  daily;     Objective:  Vitals:   04/25/24 1046  BP: 114/62  Pulse: 60  SpO2: 97%  Height: 4' 10 (1.473 m)    General: Well developed, well nourished, in no acute distress  Skin : Warm and dry.  Head: Normocephalic and  atraumatic  Eyes: Sclera and conjunctiva clear; pupils round and reactive to light; extraocular movements intact  Ears: External normal; canals clear; tympanic membranes normal  Oropharynx: Pink, supple. No suspicious lesions  Neck: Supple without thyromegaly, adenopathy  Lungs: Respirations unlabored;  Abdomen: Soft; nontender; nondistended; normoactive bowel sounds; no masses or hepatosplenomegaly  Neurologic: Alert and oriented; speech intact; face symmetrical; in wheelchair;  Assessment:  1. Generalized abdominal pain   2. Chronic constipation   3. Urinary urgency     Plan:  This issue has been present for a while now- not acute symptoms this week; recent urine culture ruled out infection; will update renal stone CT since patient is continuing to have persisting urinary discomfort; to consider having her try prescriptive medication for constipation; family and patient are aware that I will be out of the office next week and her PCP will be reaching back out for follow up.   No follow-ups on file.  Orders Placed This Encounter  Procedures   CT RENAL STONE STUDY    Standing Status:   Future    Expected Date:   04/25/2024    Expiration Date:   06/26/2024    Preferred imaging location?:   [facility]    Requested Prescriptions    No prescriptions requested or ordered in this encounter

## 2024-04-26 ENCOUNTER — Encounter: Payer: Self-pay | Admitting: Family Medicine

## 2024-05-26 ENCOUNTER — Ambulatory Visit: Admitting: Gastroenterology

## 2024-06-18 ENCOUNTER — Other Ambulatory Visit: Payer: Self-pay | Admitting: Family Medicine

## 2024-06-19 ENCOUNTER — Encounter: Payer: Self-pay | Admitting: Family Medicine

## 2024-06-20 MED ORDER — COVID-19 MRNA VAC-TRIS(PFIZER) 30 MCG/0.3ML IM SUSY: 0.3000 mL | PREFILLED_SYRINGE | Freq: Once | INTRAMUSCULAR | 0 refills | Status: AC

## 2024-06-24 ENCOUNTER — Other Ambulatory Visit: Payer: Self-pay | Admitting: Family Medicine

## 2024-06-24 DIAGNOSIS — N3281 Overactive bladder: Secondary | ICD-10-CM

## 2024-07-01 ENCOUNTER — Encounter: Payer: Self-pay | Admitting: Gastroenterology

## 2024-07-03 ENCOUNTER — Other Ambulatory Visit: Payer: Self-pay | Admitting: Family Medicine

## 2024-07-26 NOTE — Progress Notes (Addendum)
 Designer, [employer] at [employer] [address], [address] [place], [place] [address] [phone]  Date:  07/30/2024   Name:  Loretta Gates   DOB:  [birth year]   MRN:  [id number]  PCP:  [name], MD    Chief Complaint: Follow-up (L eye has been very red for the past couple of days/White toenail on R foot onset a long time )   History of Present Illness:  Loretta Gates is a 88 y.o. very pleasant female patient who presents with the following:  Patient seen today for periodic follow-up.  I saw her most recently in June of this year when she was doing with some constipation and stomach upset. History of heart failure, atrial fibrillation on Eliquis  and metoprolol .  She did suffer a CVA 02/2023 She saw her gastroenterology office in July for chronic constipation-they had some ideas for her and she is doing better in this regard  -Recommend Shingrix -Recommend RSV -Mammogram -Flu vaccine, COVID booster already completed  Eliquis  Jardiance  10 Lopressor  75 twice daily Omeprazole  Ditropan  Brillinta Sensipar - per Dr Tobie for calcium management   Discussed the use of AI scribe software for clinical note transcription with the patient, who gave verbal consent to - proceed.  History of Present Illness Loretta Gates is an 88 year old female who presents for follow-up of gastrointestinal issues and routine health maintenance.  She has been experiencing gastrointestinal issues since at least June, characterized by intermittent abdominal pain that has improved since the last visit. Previously, the pain was severe enough to cause her to be 'doubled over,' but this has not recurred. She has consulted a gastroenterologist and received treatment options that have been somewhat helpful.  She underwent a l right breast umpectomy in 2022 per Dr Vanderbilt with [facility], with no cancer found in the excised tissue. She has not had a mammogram since then.  Her last mammogram was done at an external facility with [facility].  She would really prefer to do her mammogram here, I asked her to have at least 1 more done at the other facility for consistency She is currently taking Cinacalcet  (Sensipar ), which was prescribed by Dr. Tobie for her calcium.  She experienced a subconjunctival hemorrhage in her left eye, which has since improved. She was unaware of the issue until it was pointed out by her daughter. No changes in vision or pain.  She mentions a toenail issue that began in the summer, which has since cleared up. It was suspected to be a fungal infection, but it is not currently causing any problems.    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   ICAO (internal carotid artery occlusion), left 02/06/2023   Chest pain 12/08/2022   Class 3 obesity (HCC) 10/21/2022   Acute on chronic diastolic CHF (congestive heart failure) (HCC) 10/20/2022   IDA (iron deficiency anemia) 01/13/2022   Atrial fibrillation (HCC) 01/05/2022   Breast cancer (HCC) 01/05/2022   Primary hyperparathyroidism 01/05/2022   Essential hypertension 01/05/2022   Age-related nuclear cataract of both eyes 12/11/2018   Abnormal INR 01/29/2018   Long term (current) use of anticoagulants 01/29/2018   Encounter for therapeutic drug monitoring 01/29/2018   Vitamin D  deficiency 05/03/2017   S/P laparoscopic cholecystectomy 02/26/2017   Calculus of gallbladder without cholecystitis without  obstruction 02/23/2017   Diarrhea of presumed infectious origin 02/23/2017   Hyperglycemia 02/23/2017   Right upper quadrant abdominal pain 02/23/2017   Lower extremity edema 01/18/2016   Age-related osteoporosis without current pathological fracture 01/17/2016   Diverticulosis of large intestine without hemorrhage 01/17/2016   GERD  (gastroesophageal reflux disease) 01/17/2016   History of left breast cancer 01/17/2016   Multinodular goiter 01/17/2016   OAB (overactive bladder) 01/17/2016   History of cancer chemotherapy 03/06/2015   History of left mastectomy 03/06/2015   Malignant neoplasm of overlapping sites of left female breast (HCC) 03/06/2015    Past Medical History:  Diagnosis Date   Anemia    Bladder cancer [facility])    urologist--- dr pace   Chronic diastolic (congestive) heart failure (HCC)    followed by dr pietro   Chronic venous insufficiency    Edema of both lower extremities    per pt wears compression hose   GERD (gastroesophageal reflux disease)    History of cancer chemotherapy    completed 2008 for left breast cancer   History of head and neck radiation 1946   per pt approx 1946 or 1947  (age 71) due to 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 patel;   2006  s/p left thyroidectomy w/ right inferior parathyoidectomy  (per path speciman marked parathyroid but only thyroid tissue)   Hypertension    IDA (iron deficiency anemia)    hematology/ oncologist--- dr ennever/ sarah carter NP;  treated w/ IV iron infusions   Multinodular thyroid    Neuropathy    mild hands/ feet, uses cane   Nocturia more than twice per night    Nocturnal leg cramps    OA (osteoarthritis)    knees   OAB (overactive bladder)    Osteoporosis    PAF (paroxysmal atrial fibrillation) [facility])    cardiologist--- dr pietro   PONV (postoperative nausea and vomiting)    Vitamin D  deficiency    Wears glasses     Past  Surgical History:  Procedure Laterality Date   BREAST LUMPECTOMY WITH RADIOACTIVE SEED LOCALIZATION Right 10/25/2021   Procedure: RIGHT BREAST LUMPECTOMY WITH RADIOACTIVE SEED LOCALIZATION;  Surgeon: Vanderbilt Ned, MD;  Location: [facility];  Service: General;  Laterality: Right;   CARDIOVERSION N/A 01/01/2023   Procedure: CARDIOVERSION;  Surgeon: Pietro Redell RAMAN, MD;  Location: [facility] ENDOSCOPY;  Service: Cardiovascular;  Laterality: N/A;   CHOLECYSTECTOMY, LAPAROSCOPIC  02/25/2017   @[facility]    COLONOSCOPY WITH ESOPHAGOGASTRODUODENOSCOPY (EGD)  2022   IR ANGIO INTRA EXTRACRAN SEL COM CAROTID INNOMINATE UNI R MOD SED  02/07/2023   IR ANGIO VERTEBRAL SEL SUBCLAVIAN INNOMINATE UNI L MOD SED  02/07/2023   IR CT HEAD LTD  02/07/2023   IR INTRAVSC STENT CERV CAROTID W/O EMB-PROT MOD SED INC ANGIO  02/07/2023   IR PERCUTANEOUS ART THROMBECTOMY/INFUSION INTRACRANIAL INC DIAG ANGIO  02/07/2023   IR RADIOLOGIST EVAL & MGMT  03/10/2023   MASTECTOMY WITH AXILLARY LYMPH NODE DISSECTION Left 03/05/2007   @ [facility]   OVARIAN CYST SURGERY     age 35;  abdominal   RADIOLOGY WITH ANESTHESIA N/A 02/06/2023   Procedure: IR WITH ANESTHESIA;  Surgeon: Radiologist, Medication, MD;  Location: [facility] OR;  Service: Radiology;  Laterality: N/A;   THYROIDECTOMY, PARTIAL Left 2006   left lobectomy and right infertior parathyroidectomy   TONSILLECTOMY     child    Social History   Tobacco Use   Smoking status: Former    Current packs/day: 0.00    Types: Cigarettes    Quit date: 1969    Years since quitting: 56.8   Smokeless tobacco: Never  Vaping Use   Vaping status: Never Used  Substance Use Topics   Alcohol use: Never   Drug use: Never    Family History  Problem Relation Age of Onset   Hypertension Mother    Hypertension Father    Hypertension Daughter    Hypertension Son    Sudden death Neg Hx     Allergies  Allergen Reactions   Bee Venom Swelling    Bee stung her on the inside of her gum. Lips  and gums started swelling. No SOB.   Levaquin [Levofloxacin] Hives and Itching   Peanut-Containing Drug Products Other (See Comments)    Stomach pain and diarrhea     Medication list has been reviewed and updated.  Current Outpatient Medications on File Prior to Visit  Medication Sig Dispense Refill   acetaminophen  (TYLENOL ) 325 MG tablet Take 2 tablets (650 mg total) by mouth every 4 (four) hours as needed for mild pain (or temp > 37.5 C (99.5 F)).     apixaban  (ELIQUIS ) 5 MG TABS tablet TAKE ONE TABLET BY MOUTH TWICE A DAY 180 tablet 3   cinacalcet  (SENSIPAR ) 30 MG tablet Take 1 tablet (30 mg total) by mouth daily with breakfast. 30 tablet 0   empagliflozin  (JARDIANCE ) 10 MG TABS tablet Take 1 tablet (10 mg total) by mouth daily. 90 tablet 3   ergocalciferol  (VITAMIN D2) 1.25 MG (50000 UT) capsule Take 50,000 Units by mouth every 14 (fourteen) days. Every other saturday     furosemide  (LASIX ) 20 MG tablet TAKE THREE TABLETS BY MOUTH ONE TIME DAILY , TAKE AN EXTRA TABLET AS NEEDED FOR SWELLING 270 tablet 2   magnesium  oxide (MAG-OX) 400 (240 Mg) MG tablet Take 1 tablet (400 mg total) by mouth daily. 90 tablet 0   metoprolol  tartrate (LOPRESSOR ) 50 MG tablet TAKE ONE AND ONE-HALF TABLETS BY MOUTH TWICE A DAY 270 tablet 3   omeprazole  (PRILOSEC) 40 MG capsule TAKE ONE CAPSULE BY MOUTH TWICE A DAY BEFORE A MEAL 180 capsule 1   potassium chloride  SA (KLOR-CON  M) 20 MEQ tablet Take 1 tablet (20 mEq total) by mouth once for 1 dose. TAKE ONE TABLET BY MOUTH DAILY - USE WHEN TAKING MORE THAN 20 MG OF FUROSEMIDE  DAILY 90 tablet 3   ticagrelor  (BRILINTA ) 90 MG TABS tablet Take 0.5 tablets (45 mg total) by mouth 2 (two) times daily. 90 tablet 3   No current facility-administered medications on file prior to visit.    Review of Systems:  As per HPI- otherwise negative.   Physical Examination: Vitals:   07/30/24 0925  BP: 122/76  Pulse: 90   Vitals:   07/30/24 0925  Weight: 165 lb 6.4 oz  (75 kg)  Height: 4' 10 (1.473 m)   Body mass index is 34.57 kg/m. Ideal Body Weight: Weight in (lb) to have BMI = 25: 119.4  GEN: no acute distress.  Elderly lady seated in wheelchair, looks well.  Her memory is fairly good, she does still have some difficulty with occasional word finding after her stroke Accompanied today by her daughter HEENT: Atraumatic, Normocephalic.  Resolving subconjunctival hemorrhage in the left eye, medial aspect Ears and Nose: No external deformity. CV: RRR, No M/G/R. No JVD. No thrill. No extra heart sounds. PULM: CTA B, no wheezes, crackles, rhonchi. No retractions. No resp. distress. No accessory muscle use. ABD: S, NT, ND EXTR: No c/c/e PSYCH: Normally interactive. Conversant.  Her right great toenail shows some thickening and white discoloration consistent with onychomycosis  Assessment and Plan: Encounter for screening mammogram for malignant neoplasm of breast - Plan: MM 3D SCREENING MAMMOGRAM BILATERAL BREAST  OAB (overactive bladder) - Plan: oxybutynin  (DITROPAN -XL) 10 MG 24 hr tablet  Essential hypertension - Plan: CBC, Comprehensive metabolic panel with GFR  Prediabetes - Plan: Hemoglobin A1c  Assessment & Plan Chronic gastrointestinal symptoms (abdominal pain, improved) Abdominal pain improved but not fully resolved.  Continue to follow-up with GI, she is using some techniques to improve her constipation  Atrial fibrillation-she is appropriately anticoagulated  Chronic heart failure with preserved ejection fraction  Essential hypertension under good control, she is on chronic Lasix  and potassium.  Check electrolytes  History of ischemic left MCA stroke without residual deficits  Subconjunctival hemorrhage, left eye (resolved) Resolved with no vision changes or pain.  Onychomycosis, great toe-this is not particularly bothering her, she would prefer to leave it alone  General Health Maintenance Up to date with flu shot and COVID  booster. Discussed mammogram and RSV vaccine. - Order mammogram at previous facility for continuity. - Administer RSV vaccine at pharmacy.  Signed Harlene Schroeder, MD  Received labs as below, message to patient Results for orders placed or performed in visit on 07/30/24  CBC   Collection Time: 07/30/24 10:00 AM  Result Value Ref Range   WBC 7.8 4.0 - 10.5 K/uL   RBC 4.23 3.87 - 5.11 Mil/uL   Platelets 310.0 150.0 - 400.0 K/uL   Hemoglobin 9.6 (L) 12.0 - 15.0 g/dL   HCT 68.6 (L) 63.9 - 53.9 %   MCV 74.1 (L) 78.0 - 100.0 fl   MCHC 30.8 30.0 - 36.0 g/dL   RDW 82.5 (H) 88.4 - 84.4 %  Comprehensive metabolic panel with GFR   Collection Time: 07/30/24 10:00 AM  Result Value Ref Range   Sodium 143 135 - 145 mEq/L   Potassium 3.7 3.5 - 5.1 mEq/L   Chloride 102 96 - 112 mEq/L   CO2 32 19 - 32 mEq/L   Glucose, Bld 88 70 - 99 mg/dL   BUN 19 6 - 23 mg/dL   Creatinine, Ser 9.00 0.40 - 1.20 mg/dL   Total Bilirubin 0.9 0.2 - 1.2 mg/dL   Alkaline Phosphatase 47 39 - 117 U/L   AST 13 0 - 37 U/L   ALT 12 0 - 35 U/L   Total Protein 6.9 6.0 - 8.3 g/dL   Albumin 4.4 3.5 - 5.2 g/dL   GFR 48.29 (L) >39.99 mL/min   Calcium 8.6 8.4 - 10.5 mg/dL  Hemoglobin J8r   Collection Time: 07/30/24 10:00 AM  Result Value Ref Range   Hgb A1c MFr Bld 6.5 4.6 - 6.5 %    "

## 2024-07-26 NOTE — Patient Instructions (Incomplete)
 It was good to see you again today Recommend the updated shingles vaccine series, called Shingrix.  Also recommend 1 dose of RSV if not already I will be in touch with your labs Please call and set up your mammogram at your convenience  Address: [address], [place], [place] [address] Phone: [phone]

## 2024-07-30 ENCOUNTER — Encounter: Payer: Self-pay | Admitting: Family Medicine

## 2024-07-30 ENCOUNTER — Ambulatory Visit

## 2024-07-30 ENCOUNTER — Ambulatory Visit: Admitting: Family Medicine

## 2024-07-30 VITALS — BP 122/76 | HR 90 | Ht <= 58 in | Wt 165.4 lb

## 2024-07-30 DIAGNOSIS — D509 Iron deficiency anemia, unspecified: Secondary | ICD-10-CM | POA: Diagnosis not present

## 2024-07-30 DIAGNOSIS — N3281 Overactive bladder: Secondary | ICD-10-CM

## 2024-07-30 DIAGNOSIS — I10 Essential (primary) hypertension: Secondary | ICD-10-CM

## 2024-07-30 DIAGNOSIS — Z1231 Encounter for screening mammogram for malignant neoplasm of breast: Secondary | ICD-10-CM | POA: Diagnosis not present

## 2024-07-30 DIAGNOSIS — R7303 Prediabetes: Secondary | ICD-10-CM

## 2024-07-30 LAB — CBC
HCT: 31.3 % — ABNORMAL LOW (ref 36.0–46.0)
Hemoglobin: 9.6 g/dL — ABNORMAL LOW (ref 12.0–15.0)
MCHC: 30.8 g/dL (ref 30.0–36.0)
MCV: 74.1 fl — ABNORMAL LOW (ref 78.0–100.0)
Platelets: 310 K/uL (ref 150.0–400.0)
RBC: 4.23 Mil/uL (ref 3.87–5.11)
RDW: 17.4 % — ABNORMAL HIGH (ref 11.5–15.5)
WBC: 7.8 K/uL (ref 4.0–10.5)

## 2024-07-30 LAB — COMPREHENSIVE METABOLIC PANEL WITH GFR
ALT: 12 U/L (ref 0–35)
AST: 13 U/L (ref 0–37)
Albumin: 4.4 g/dL (ref 3.5–5.2)
Alkaline Phosphatase: 47 U/L (ref 39–117)
BUN: 19 mg/dL (ref 6–23)
CO2: 32 meq/L (ref 19–32)
Calcium: 8.6 mg/dL (ref 8.4–10.5)
Chloride: 102 meq/L (ref 96–112)
Creatinine, Ser: 0.99 mg/dL (ref 0.40–1.20)
GFR: 51.7 mL/min — ABNORMAL LOW (ref >60.00)
Glucose, Bld: 88 mg/dL (ref 70–99)
Potassium: 3.7 meq/L (ref 3.5–5.1)
Sodium: 143 meq/L (ref 135–145)
Total Bilirubin: 0.9 mg/dL (ref 0.2–1.2)
Total Protein: 6.9 g/dL (ref 6.0–8.3)

## 2024-07-30 LAB — HEMOGLOBIN A1C: Hgb A1c MFr Bld: 6.5 % (ref 4.6–6.5)

## 2024-07-30 MED ORDER — OXYBUTYNIN CHLORIDE ER 10 MG PO TB24
10.0000 mg | ORAL_TABLET | Freq: Two times a day (BID) | ORAL | 3 refills | Status: AC
Start: 2024-07-30 — End: ?

## 2024-07-30 NOTE — Addendum Note (Signed)
 Addended by: WATT RAISIN C on: 07/30/2024 08:17 PM   Modules accepted: Orders

## 2024-08-06 ENCOUNTER — Ambulatory Visit: Admitting: Adult Health

## 2024-08-06 ENCOUNTER — Encounter: Payer: Self-pay | Admitting: Adult Health

## 2024-08-06 VITALS — BP 120/64 | HR 82 | Ht <= 58 in | Wt 165.0 lb

## 2024-08-06 DIAGNOSIS — I69320 Aphasia following cerebral infarction: Secondary | ICD-10-CM | POA: Diagnosis not present

## 2024-08-06 DIAGNOSIS — Z95828 Presence of other vascular implants and grafts: Secondary | ICD-10-CM | POA: Diagnosis not present

## 2024-08-06 DIAGNOSIS — I63512 Cerebral infarction due to unspecified occlusion or stenosis of left middle cerebral artery: Secondary | ICD-10-CM

## 2024-08-06 DIAGNOSIS — I6522 Occlusion and stenosis of left carotid artery: Secondary | ICD-10-CM | POA: Diagnosis not present

## 2024-08-06 NOTE — Patient Instructions (Signed)
 Your Plan:  Continue Eliquis  and Brilinta  for stroke prevention  You will be called to schedule a carotid ultrasound to follow up on your carotid stent. Will follow up with Dr. Rosemarie and IR regarding ongoing need of Brilinta  after review of ultrasound   Continue to follow with your PCP and cardiology for stroke prevention measures     Follow up as needed at this time      Thank you for coming to see us  at [facility]. I hope we have been able to provide you high quality care today.  You may receive a patient satisfaction survey over the next few weeks. We would appreciate your feedback and comments so that we may continue to improve ourselves and the health of our patients.

## 2024-08-06 NOTE — Progress Notes (Signed)
 [facility] [address] [place]. [place] [address] [phone]       OFFICE FOLLOW-UP VISIT NOTE  Loretta Gates Date of Birth:  04-May-1936 Medical Record Number:  [id number]   Primary neurologist: Dr. Rosemarie Reason for visit: Stroke follow-up   Chief Complaint  Patient presents with   RM 8     Patient is here with daughter for stroke  follow-up - would like to know why she still needs to continue with follow-up appts.       HPI:   Update 08/06/2024 JM: Patient returns for follow-up visit after prior visit with Dr. Rosemarie 8 months ago accompanied by her daughter.  Reports overall doing very well from a stroke standpoint.  Reports residual mild aphasia and right peripheral vision impairment which has been stable since prior visit.  No new stroke/TIA symptoms.  Ambulates with rolling walker indoors and tri walker outdoors. She takes daily walks outside. She will use w/c when going to appointments. Denies any recent falls. Lives with her husband and daughter, she is able to maintain most ADLs independently but occasionally needs assistance. She remains on Eliquis  and Brilinta  without significant side effects.  Routinely follows with PCP for stroke risk factor management.  No questions or concerns at this time.     History provided for reference purposes only Update 11/26/2023 Dr. Rosemarie: She returns for follow-up after last visit 6 months ago.  She is accompanied by her daughter.  Patient continues to have mild aphasia but is able to communicate better.  She is able to ambulate with a walker and does fairly well.  She has had no falls or injuries.  She prefers to use a wheelchair for long distances.  She has had no recurrent stroke or TIA symptoms.  She remains on Eliquis  which is tolerating well without bruising or bleeding and Brilinta  for carotid stent.  She did undergo lab work at last visit and vitamin B12, TSH, RPR and homocystine were normal.  Hemoglobin A1c on  08/01/2023 was borderline at 6 patient continues to have memory and cognitive difficulties.  She has good days and bad days.  Family feels that her blood to work with her.  Patient and family reluctant to try medications like Aricept and Namenda after I discussed possible side effects with them.  She has 24-hour care at home.  She does not have any delusions, hallucinations, agitation or unsafe behavior.  She has no new complaints.  Initial visit 05/15/2023 Dr. Rosemarie: [name] is an 88 year old pleasant Caucasian lady seen today for initial office follow-up visit following hospital admission for stroke in April 2024.  She is accompanied by her husband and daughter today and history is obtained from them as well as review of electronic medical records.  I personally reviewed pertinent available imaging films in PACS.  She has past medical history of bladder cancer, chronic diastolic heart failure, chronic venous insufficiency, breast cancer status postchemotherapy in 2008, prior head and neck radiation in 1940s at age 20, prior left thyroidectomy, right inferior parathyroid ectomy, hypertension, anemia, mild neuropathy, vitamin D  deficiency and paroxysmal A-fib on Eliquis  s/p failed cardioversion 2 months prior.  She presented to [facility] emergency room on 02/06/2023 with recurrent spells of left eye transient painless vision loss.  First spell occurred a few days prior when she had complete whiteout of vision that progressed to 2 to blackening of vision lasting about less than a minute then gradually resolving back to baseline  over a period of 3 minutes.  Second spell occurred on the day of admission which also lasted barely a minute or so and third spell also occurred on the same day lasting a few minutes.  Following the last episode that day she did not have any right-sided weakness numbness facial droop coordination and speech difficulties and gait unsteadiness She had been off Eliquis  for 3 days for  a bladder scraping procedure and had just restarted Eliquis  and taken only her first dose back earlier that day.  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  Her NIH stroke scale was 0 and premorbid modified Rankin was also 0.  Due to her neurological instability with recurrent transient symptoms it was decided to revascularize her occluded left carotid as well as intracranial left M2 which was done successfully by Dr. Dolphus with TICI3 revascularization of the left M2 and rescue proximal left ICA angioplasty and stenting.  Postprocedure CT scan showed mild left perisylvian subarachnoid hemorrhage.  MRI scan showed a large posterior left MCA infarct with small left cerebellum, left occipital lobe infarcts and subarachnoid hemorrhage in the left sylvian fissure over the posterior left frontal lobe.  Follow-up CT scan showed stable appearance of the small left temporal and sylvian subarachnoid hemorrhage.  2D echo showed ejection fraction of 55 to 60% and was done in January 2024 hence not repeated.  LDL cholesterol 58 mg percent.  Hemoglobin A1c was 6.2.  Urine drug screen was negative.  Patient had been on Eliquis  prior to admission.  She was temporarily placed on aspirin  and Brilinta  following carotid stent and subsequently switched to Eliquis  and Brilinta  at discharge.  Patient was transferred to inpatient rehab and did well.  She has been at home now for last 2 months.  She finished home physical therapy.  She is able to walk with a walker when walking outdoors and long distances but can walk short distances by herself.  She walks slowly and carefully and has not had any falls.  She still has some occasional word hesitancy but speech seems quite improved.  She can have brief  conversations but is not able to speak long sentences.  Family has noticed decreased short-term memory though remote memory seems intact.  She is mostly independent in most activities of daily living though she needs some help with shower.  She is tolerating Eliquis  and Brilinta  well but does bruise easily but has had no bleeding episodes.  She has had some mild episodes of sundowning and confusion at night.  There is no family history of dementia.  She still has persistent right-sided vision loss which has not improved     ROS:   14 system review of systems is positive for those listed in HPI and all other systems negative  PMH:  Past Medical History:  Diagnosis Date   Anemia    Bladder cancer [facility])    urologist--- dr pace   Chronic diastolic (congestive) heart failure (HCC)    followed by dr pietro   Chronic venous insufficiency    Edema of both lower extremities    per pt wears compression hose   GERD (gastroesophageal reflux disease)    History of cancer chemotherapy    completed 2008 for left breast cancer   History of head and neck  radiation 1946   per pt approx 1946 or 62  (age 66) due to 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 patel;   2006  s/p left thyroidectomy w/ right inferior parathyoidectomy  (per path speciman marked parathyroid but only thyroid tissue)   Hypertension    IDA (iron deficiency anemia)    hematology/ oncologist--- dr ennever/ sarah carter NP;  treated w/ IV iron infusions   Multinodular thyroid    Neuropathy    mild hands/ feet, uses cane   Nocturia  more than twice per night    Nocturnal leg cramps    OA (osteoarthritis)    knees   OAB (overactive bladder)    Osteoporosis    PAF (paroxysmal atrial fibrillation) [facility])    cardiologist--- dr pietro   PONV (postoperative nausea and vomiting)    Vitamin D  deficiency    Wears glasses     Social History:  Social History   Socioeconomic History   Marital status: Married    Spouse name: Not on file   Number of children: 2   Years of education: Not on file   Highest education level: Bachelor's degree (e.g., BA, AB, BS)  Occupational History   Occupation: retired  Tobacco Use   Smoking status: Former    Current packs/day: 0.00    Types: Cigarettes    Quit date: 1969    Years since quitting: 56.8   Smokeless tobacco: Never  Vaping Use   Vaping status: Never Used  Substance and Sexual Activity   Alcohol use: Never   Drug use: Never   Sexual activity: Not Currently    Birth control/protection: Post-menopausal  Other Topics Concern   Not on file  Social History Narrative   Decaf coffee only at a restaurant may have 1 cup of regular coffee    Social Drivers of Corporate Investment Banker Strain: Low Risk  (02/27/2023)   Overall Financial Resource Strain (CARDIA)    Difficulty of Paying Living Expenses: Not hard at all  Food Insecurity: No Food Insecurity (02/27/2023)   Hunger Vital Sign    Worried About Running Out of Food in the Last Year: Never true    Ran Out of Food in the Last Year: Never true  Transportation Needs: No Transportation Needs (02/27/2023)   PRAPARE - Administrator, Civil Service (Medical): No    Lack of Transportation (Non-Medical): No  Physical Activity: Unknown (02/27/2023)   Exercise Vital Sign    Days of Exercise per Week: 0 days    Minutes of Exercise per Session: Not on file  Stress: No Stress Concern Present (02/27/2023)   [employer] of Occupational Health - Occupational Stress Questionnaire    Feeling of Stress : Not at all   Social Connections: Socially Integrated (02/27/2023)   Social Connection and Isolation Panel    Frequency of Communication with Friends and Family: Twice a week    Frequency of Social Gatherings with Friends and Family: More than three times a week    Attends Religious Services: More than 4 times per year  Active Member of Clubs or Organizations: Yes    Attends Banker Meetings: 1 to 4 times per year    Marital Status: Married  Catering Manager Violence: Not At Risk (10/21/2022)   Humiliation, Afraid, Rape, and Kick questionnaire    Fear of Current or Ex-Partner: No    Emotionally Abused: No    Physically Abused: No    Sexually Abused: No    Medications:   Current Outpatient Medications on File Prior to Visit  Medication Sig Dispense Refill   acetaminophen  (TYLENOL ) 325 MG tablet Take 2 tablets (650 mg total) by mouth every 4 (four) hours as needed for mild pain (or temp > 37.5 C (99.5 F)).     apixaban  (ELIQUIS ) 5 MG TABS tablet TAKE ONE TABLET BY MOUTH TWICE A DAY 180 tablet 3   cinacalcet  (SENSIPAR ) 30 MG tablet Take 1 tablet (30 mg total) by mouth daily with breakfast. 30 tablet 0   empagliflozin  (JARDIANCE ) 10 MG TABS tablet Take 1 tablet (10 mg total) by mouth daily. 90 tablet 3   ergocalciferol  (VITAMIN D2) 1.25 MG (50000 UT) capsule Take 50,000 Units by mouth every 14 (fourteen) days. Every other saturday     furosemide  (LASIX ) 20 MG tablet TAKE THREE TABLETS BY MOUTH ONE TIME DAILY , TAKE AN EXTRA TABLET AS NEEDED FOR SWELLING 270 tablet 2   magnesium  oxide (MAG-OX) 400 (240 Mg) MG tablet Take 1 tablet (400 mg total) by mouth daily. 90 tablet 0   metoprolol  tartrate (LOPRESSOR ) 50 MG tablet TAKE ONE AND ONE-HALF TABLETS BY MOUTH TWICE A DAY 270 tablet 3   omeprazole  (PRILOSEC) 40 MG capsule TAKE ONE CAPSULE BY MOUTH TWICE A DAY BEFORE A MEAL 180 capsule 1   oxybutynin  (DITROPAN -XL) 10 MG 24 hr tablet Take 1 tablet (10 mg total) by mouth in the morning and at  bedtime. 180 tablet 3   ticagrelor  (BRILINTA ) 90 MG TABS tablet Take 0.5 tablets (45 mg total) by mouth 2 (two) times daily. 90 tablet 3   potassium chloride  SA (KLOR-CON  M) 20 MEQ tablet Take 1 tablet (20 mEq total) by mouth once for 1 dose. TAKE ONE TABLET BY MOUTH DAILY - USE WHEN TAKING MORE THAN 20 MG OF FUROSEMIDE  DAILY 90 tablet 3   No current facility-administered medications on file prior to visit.    Allergies:   Allergies  Allergen Reactions   Bee Venom Swelling    Bee stung her on the inside of her gum. Lips and gums started swelling. No SOB.   Levaquin [Levofloxacin] Hives and Itching   Peanut-Containing Drug Products Other (See Comments)    Stomach pain and diarrhea     Physical Exam Today's Vitals   08/06/24 1504  BP: 120/64  Pulse: 82  SpO2: 98%  Weight: 165 lb (74.8 kg)  Height: 4' 10 (1.473 m)   Body mass index is 34.49 kg/m.   General: Pleasant elderly Caucasian lady seated, in no evident distress  Neurologic Exam Mental Status: Awake and fully alert. Oriented to place and time. Recent memory mildly impaired and remote memory intact. Attention span, concentration and fund of knowledge mostly appropriate with daughter providing some history. Mood and affect appropriate.  Speech mostly fluent with occasional word finding difficulties and hesitation.  Good comprehension, naming and repetition.  Cranial Nerves: Pupils equal, briskly reactive to light. Extraocular movements full without nystagmus. Visual fields shows dense right homonymous hemianopsia to confrontation. Hearing intact. Facial sensation intact. Face, tongue, palate moves normally and  symmetrically.  Motor: Equal strength in all tested extremities Sensory.: intact to touch , pinprick , position and vibratory sensation.  Coordination: Rapid alternating movements normal in all extremities.  Gait and Station: Deferred as patient is a wheelchair and did not bring her walker.      ASSESSMENT:  88 year old Caucasian lady with left MCA infarct in April 2024 secondary to left ICA occlusion with embolization to the left MCA s/p mechanical thrombectomy and revascularization of left MCA with rescue left ICA stenting.  This occurred when patient was off anticoagulation due to elective surgery.       PLAN:   L MCA stroke S/p L ICA stent Residual deficits: Mild aphasia and right hemianopia. Overall stable.  Continue Eliquis  for A fib and Brilinta  post stent manage/prescribed by PCP/cardiology Recommend repeat carotid ultrasound to evaluate patency of stent. If patent, will discuss with Dr. Rosemarie ongoing need of Brilinta  or will refer to reestablish care with IR (prior patient of Dr. Dolphus) Continue close PCP and cardiology follow-up for aggressive stroke risk factor management    No further recommendations from stroke standpoint and is closely being followed by PCP.  She can follow-up here on an as-needed basis and advised to call with any questions or concerns in the future.    I personally spent a total of 30 minutes in the care of the patient today including preparing to see the patient, performing a medically appropriate exam/evaluation, counseling and educating, placing orders, and documenting clinical information in the EHR.  Harlene Bogaert, AGNP-BC  [facility] [address] [place], [place] [address]  Phone [phone] Fax [phone] Note: This document was prepared with digital dictation and possible smart phrase technology. Any transcriptional errors that result from this process are unintentional.

## 2024-08-07 ENCOUNTER — Ambulatory Visit: Payer: Medicare HMO | Admitting: Neurology

## 2024-08-10 ENCOUNTER — Encounter: Payer: Self-pay | Admitting: Family Medicine

## 2024-08-11 MED ORDER — TICAGRELOR 90 MG PO TABS: 45.0000 mg | ORAL_TABLET | Freq: Two times a day (BID) | ORAL | 1 refills | Status: DC

## 2024-08-12 ENCOUNTER — Ambulatory Visit: Payer: Self-pay | Admitting: Adult Health

## 2024-08-12 ENCOUNTER — Ambulatory Visit (HOSPITAL_COMMUNITY)
Admission: RE | Admit: 2024-08-12 | Discharge: 2024-08-12 | Disposition: A | Discharge status: Home / Self Care | Source: Ambulatory Visit | Attending: Adult Health | Admitting: Adult Health

## 2024-08-12 DIAGNOSIS — Z95828 Presence of other vascular implants and grafts: Secondary | ICD-10-CM | POA: Insufficient documentation

## 2024-08-12 DIAGNOSIS — I6522 Occlusion and stenosis of left carotid artery: Secondary | ICD-10-CM | POA: Insufficient documentation

## 2024-08-13 ENCOUNTER — Encounter: Payer: Self-pay | Admitting: Family Medicine

## 2024-08-14 ENCOUNTER — Ambulatory Visit: Payer: Self-pay | Admitting: Family Medicine

## 2024-08-14 ENCOUNTER — Ambulatory Visit (HOSPITAL_BASED_OUTPATIENT_CLINIC_OR_DEPARTMENT_OTHER)
Admission: RE | Admit: 2024-08-14 | Discharge: 2024-08-14 | Disposition: A | Discharge status: Home / Self Care | Source: Ambulatory Visit | Attending: Family Medicine | Admitting: Family Medicine

## 2024-08-14 ENCOUNTER — Ambulatory Visit: Payer: Self-pay

## 2024-08-14 ENCOUNTER — Encounter: Payer: Self-pay | Admitting: Family Medicine

## 2024-08-14 ENCOUNTER — Ambulatory Visit: Admitting: Family Medicine

## 2024-08-14 VITALS — BP 112/54 | HR 84 | Temp 98.2°F | Ht <= 58 in

## 2024-08-14 DIAGNOSIS — R059 Cough, unspecified: Secondary | ICD-10-CM | POA: Diagnosis not present

## 2024-08-14 DIAGNOSIS — R042 Hemoptysis: Secondary | ICD-10-CM | POA: Diagnosis not present

## 2024-08-14 DIAGNOSIS — R051 Acute cough: Secondary | ICD-10-CM

## 2024-08-14 DIAGNOSIS — J069 Acute upper respiratory infection, unspecified: Secondary | ICD-10-CM | POA: Diagnosis not present

## 2024-08-14 LAB — POC COVID19 BINAXNOW: SARS Coronavirus 2 Ag: NEGATIVE

## 2024-08-14 MED ORDER — GUAIFENESIN ER 600 MG PO TB12: 1200.0000 mg | ORAL_TABLET | Freq: Two times a day (BID) | ORAL | 0 refills | Status: DC

## 2024-08-14 MED ORDER — BENZONATATE 100 MG PO CAPS: 100.0000 mg | ORAL_CAPSULE | Freq: Two times a day (BID) | ORAL | 0 refills | Status: AC | PRN

## 2024-08-14 NOTE — Progress Notes (Signed)
 Acute Office Visit  Subjective:  Patient ID: Loretta Gates, female    DOB: [birth year]  Age: 88 y.o. MRN: [id number]  CC:  Chief Complaint  Patient presents with   Cough      HPI Loretta Gates is here for Cough.    Discussed the use of AI scribe software for clinical note transcription with the patient, who gave verbal consent to proceed.  History of Present Illness Loretta Gates is an 88 year old female who presents with a productive cough.  She has been experiencing a productive cough since Monday, with symptoms beginning on Sunday. The cough produces white frothy sputum and occasionally bright red blood. No fever, chills, chest pain, or shortness of breath. She also has a runny nose and sneezing.  She has not found relief from Coricidin, which she stopped taking due to ineffectiveness. She has not yet tried Tessalon Perles or Mucinex. Her symptoms have remained consistent since onset, neither improving nor worsening.  Her daughter, who works at a school, may have been a source of exposure, although she denies any known sick contacts. She is up to date on her flu and COVID vaccinations.     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 patel;   2006  s/p left thyroidectomy w/ right inferior parathyoidectomy  (per path speciman marked parathyroid but only thyroid tissue)   Hypertension    IDA (iron deficiency anemia)    hematology/ oncologist--- dr ennever/ sarah carter NP;  treated w/ IV iron infusions   Multinodular thyroid    Neuropathy    mild hands/ feet, uses cane   Nocturia more than twice per night    Nocturnal leg cramps    OA (osteoarthritis)    knees   OAB (overactive bladder)    Osteoporosis    PAF (paroxysmal atrial fibrillation) [facility])    cardiologist--- dr pietro   PONV (postoperative nausea and vomiting)    Vitamin D  deficiency    Wears glasses     Past Surgical History:  Procedure Laterality Date   BREAST LUMPECTOMY WITH RADIOACTIVE SEED LOCALIZATION Right 10/25/2021   Procedure: RIGHT BREAST LUMPECTOMY WITH RADIOACTIVE SEED LOCALIZATION;  Surgeon: Vanderbilt Ned, MD;  Location: [facility];  Service: General;  Laterality: Right;   CARDIOVERSION N/A 01/01/2023   Procedure: CARDIOVERSION;  Surgeon: Pietro Redell RAMAN, MD;  Location: [facility];  Service: Cardiovascular;  Laterality: N/A;   CHOLECYSTECTOMY, LAPAROSCOPIC  02/25/2017   @[facility]    COLONOSCOPY WITH  ESOPHAGOGASTRODUODENOSCOPY (EGD)  2022   IR ANGIO INTRA EXTRACRAN SEL COM CAROTID INNOMINATE UNI R MOD SED  02/07/2023   IR ANGIO VERTEBRAL SEL SUBCLAVIAN INNOMINATE UNI L MOD SED  02/07/2023   IR CT HEAD LTD  02/07/2023   IR INTRAVSC STENT CERV CAROTID W/O EMB-PROT MOD SED INC ANGIO  02/07/2023   IR PERCUTANEOUS ART THROMBECTOMY/INFUSION INTRACRANIAL INC DIAG ANGIO  02/07/2023   IR RADIOLOGIST EVAL & MGMT  03/10/2023   MASTECTOMY WITH AXILLARY LYMPH NODE DISSECTION Left 03/05/2007   @ [facility]   OVARIAN CYST SURGERY     age 57;   abdominal   RADIOLOGY WITH ANESTHESIA N/A 02/06/2023   Procedure: IR  WITH ANESTHESIA;  Surgeon: Radiologist, Medication, MD;  Location: [facility] OR;  Service: Radiology;  Laterality: N/A;   THYROIDECTOMY, PARTIAL Left 2006   left lobectomy and right infertior parathyroidectomy   TONSILLECTOMY     child    Family History  Problem Relation Age of Onset   Hypertension Mother    Hypertension Father    Hypertension Daughter    Hypertension Son    Sudden death Neg Hx     Social History   Socioeconomic History   Marital status: Married    Spouse name: Not on file   Number of children: 2   Years of education: Not on file   Highest education level: Bachelor's degree (e.g., BA, AB, BS)  Occupational History   Occupation: retired  Tobacco Use   Smoking status: Former    Current packs/day: 0.00    Types: Cigarettes    Quit date: 1969    Years since quitting: 56.8   Smokeless tobacco: Never  Vaping Use   Vaping status: Never Used  Substance and Sexual Activity   Alcohol use: Never   Drug use: Never   Sexual activity: Not Currently    Birth control/protection: Post-menopausal  Other Topics Concern   Not on file  Social History Narrative   Decaf coffee only at a restaurant may have 1 cup of regular coffee    Social Drivers of Corporate Investment Banker Strain: Low Risk  (02/27/2023)   Overall Financial Resource Strain (CARDIA)    Difficulty of Paying Living Expenses: Not hard at all  Food Insecurity: No Food Insecurity (02/27/2023)   Hunger Vital Sign    Worried About Running Out of Food in the Last Year: Never true    Ran Out of Food in the Last Year: Never true  Transportation Needs: No Transportation Needs (02/27/2023)   PRAPARE - Administrator, Civil Service (Medical): No    Lack of Transportation (Non-Medical): No  Physical Activity: Unknown (02/27/2023)   Exercise Vital Sign    Days of Exercise per Week: 0 days    Minutes of Exercise per Session: Not on file  Stress: No Stress Concern Present (02/27/2023)   [employer] of  Occupational Health - Occupational Stress Questionnaire    Feeling of Stress : Not at all  Social Connections: Socially Integrated (02/27/2023)   Social Connection and Isolation Panel    Frequency of Communication with Friends and Family: Twice a week    Frequency of Social Gatherings with Friends and Family: More than three times a week    Attends Religious Services: More than 4 times per year    Active Member of [employer] or Organizations: Yes    Attends Banker Meetings: 1 to 4 times per year    Marital Status: Married  Intimate  Partner Violence: Not At Risk (10/21/2022)   Humiliation, Afraid, Rape, and Kick questionnaire    Fear of Current or Ex-Partner: No    Emotionally Abused: No    Physically Abused: No    Sexually Abused: No    ROS All ROS negative except what is listed in the HPI.   Objective:   Today's Vitals: BP (!) 112/54   Pulse 84   Temp 98.2 F (36.8 C) (Oral)   Ht 4' 10 (1.473 m)   SpO2 97%   BMI 34.49 kg/m   Physical Exam Vitals reviewed.  Constitutional:      Appearance: Normal appearance.  HENT:     Right Ear: Tympanic membrane normal.     Left Ear: Tympanic membrane normal.     Nose: No congestion or rhinorrhea.     Mouth/Throat:     Pharynx: No oropharyngeal exudate.  Cardiovascular:     Rate and Rhythm: Normal rate. Rhythm irregular.     Heart sounds: Normal heart sounds.  Pulmonary:     Effort: Pulmonary effort is normal.     Breath sounds: Normal breath sounds. No wheezing, rhonchi or rales.  Lymphadenopathy:     Cervical: No cervical adenopathy.  Skin:    General: Skin is warm and dry.  Neurological:     Mental Status: She is alert and oriented to person, place, and time.  Psychiatric:        Mood and Affect: Mood normal.        Behavior: Behavior normal.        Thought Content: Thought content normal.        Judgment: Judgment normal.         Assessment & Plan:   Problem List Items Addressed This Visit    None Visit Diagnoses       Cough with hemoptysis    -  Primary   Relevant Medications   benzonatate (TESSALON) 100 MG capsule   guaiFENesin (MUCINEX) 600 MG 12 hr tablet   Other Relevant Orders   DG Chest 2 View (Completed)     Acute cough       Relevant Orders   POC COVID-19 BinaxNow (Completed)       Assessment & Plan Cough - Ordered chest x-ray to evaluate hemoptysis. If signs of pneumonia or symptoms persist/worsen, will add antibiotics.  - Prescribed Tessalon pearls for cough management. - Recommended Mucinex - Advised rest and adequate hydration - Instructed to follow up if symptoms do not improve by Monday.  Patient aware of signs/symptoms requiring further/urgent evaluation.    Follow-up: Return if symptoms worsen or fail to improve.   Waddell FURY Almarie, DNP, FNP-C  I,Emily Lagle,acting as a neurosurgeon for Waddell KATHEE Almarie, NP.,have documented all relevant documentation on the behalf of Waddell KATHEE Almarie, NP.   I, Waddell KATHEE Almarie, NP, have reviewed all documentation for this visit. The documentation on 08/14/2024 for the exam, diagnosis, procedures, and orders are all accurate and complete.

## 2024-08-14 NOTE — Telephone Encounter (Signed)
  FYI Only or Action Required?: Action required by provider: request for appointment.  Patient was last seen in primary care on 07/30/2024 by Copland, Harlene BROCKS, MD.  Called Nurse Triage reporting Cough.  Symptoms began several days ago.  Interventions attempted: OTC medications:  SABRA  Symptoms are: gradually worsening.  Triage Disposition: See Physician Within 24 Hours  Patient/caregiver understands and will follow disposition?: Yes   Copied from CRM [id number]. Topic: Clinical - Red Word Triage >> Aug 14, 2024  8:31 AM Antwanette L wrote: Red Word that prompted transfer to Nurse Triage: Aleck, the patient's daughter, called to report that she is experiencing a runny nose, deep chest cough, fatigue, and is coughing up white mucus >> Aug 14, 2024  8:41 AM Antwanette L wrote: The call dropped while holding for NT. Reason for Disposition  [1] Continuous (nonstop) coughing interferes with work or school AND [2] no improvement using cough treatment per Care Advice  Answer Assessment - Initial Assessment Questions 1. ONSET: When did the cough begin?      2 days 2. SEVERITY: How bad is the cough today?      severe 3. SPUTUM: Describe the color of your sputum (e.g., none, dry cough; clear, white, yellow, green)     white 4. HEMOPTYSIS: Are you coughing up any blood? If Yes, ask: How much? (e.g., flecks, streaks, tablespoons, etc.)     no 5. DIFFICULTY BREATHING: Are you having difficulty breathing? If Yes, ask: How bad is it? (e.g., mild, moderate, severe)      no 6. FEVER: Do you have a fever? If Yes, ask: What is your temperature, how was it measured, and when did it start?     no 7. CARDIAC HISTORY: Do you have any history of heart disease? (e.g., heart attack, congestive heart failure)      no 8. LUNG HISTORY: Do you have any history of lung disease?  (e.g., pulmonary embolus, asthma, emphysema)     no 9. PE RISK FACTORS: Do you have a history of blood  clots? (or: recent major surgery, recent prolonged travel, bedridden)     no 10. OTHER SYMPTOMS: Do you have any other symptoms? (e.g., runny nose, wheezing, chest pain)       fatigue 11. PREGNANCY: Is there any chance you are pregnant? When was your last menstrual period?       no 12. TRAVEL: Have you traveled out of the country in the last month? (e.g., travel history, exposures)       no  Protocols used: Cough - Acute Productive-A-AH

## 2024-08-20 ENCOUNTER — Ambulatory Visit (INDEPENDENT_AMBULATORY_CARE_PROVIDER_SITE_OTHER): Admitting: *Deleted

## 2024-08-20 VITALS — Ht <= 58 in | Wt 165.0 lb

## 2024-08-20 DIAGNOSIS — Z0000 Encounter for general adult medical examination without abnormal findings: Secondary | ICD-10-CM

## 2024-08-20 NOTE — Patient Instructions (Addendum)
 Loretta Gates,  Thank you for taking the time for your Medicare Wellness Visit. I appreciate your continued commitment to your health goals. Please review the care plan we discussed, and feel free to reach out if I can assist you further.  Please note that Annual Wellness Visits do not include a physical exam. Some assessments may be limited, especially if the visit was conducted virtually. If needed, we may recommend an in-person follow-up with your provider.  Ongoing Care Seeing your primary care provider every 3 to 6 months helps us  monitor your health and provide consistent, personalized care.   Dr Watt: 01/28/25 10:20am Annual Wellness Visit: 08/21/25  9am, telephone  Referrals If a referral was made during today's visit and you haven't received any updates within two weeks, please contact the referred provider directly to check on the status.  Mammogram ([facility] [address]):  [phone]  Recommended Screenings:  You will need to get the following vaccines at your local pharmacy: 2nd Shingles  Health Maintenance  Topic Date Due   Breast Cancer Screening  Never done   Zoster (Shingles) Vaccine (2 of 2) 03/31/2022   COVID-19 Vaccine (8 - Pfizer risk 2025-26 season) 01/05/2025   Medicare Annual Wellness Visit  08/20/2025   DTaP/Tdap/Td vaccine (4 - Tdap) 01/12/2032   Pneumococcal Vaccine for age over 21  Completed   Flu Shot  Completed   DEXA scan (bone density measurement)  Completed   Meningitis B Vaccine  Aged Out       08/20/2024    9:06 AM  Advanced Directives  Does Patient Have a Medical Advance Directive? Yes  Type of Estate Agent of [place];Living will  Does patient want to make changes to medical advance directive? No - Guardian declined  Copy of Healthcare Power of Attorney in Chart? No - copy requested   Bring a copy of your health care power of attorney and living will to the office to be added to your chart at your  convenience. You can mail a copy to [facility] [address] [place], [place] [address] or email to [email or web address]   Vision: Annual vision screenings are recommended for early detection of glaucoma, cataracts, and diabetic retinopathy. These exams can also reveal signs of chronic conditions such as diabetes and high blood pressure.  Dental: Annual dental screenings help detect early signs of oral cancer, gum disease, and other conditions linked to overall health, including heart disease and diabetes.  Please see the attached documents for additional preventive care recommendations.

## 2024-08-20 NOTE — Progress Notes (Signed)
 Chief Complaint  Patient presents with   Medicare Wellness     Subjective:   Loretta Gates is a 88 y.o. female who presents for a Medicare Annual Wellness Visit.  Allergies (verified) Bee venom, Levaquin [levofloxacin], and Peanut-containing drug products   History: Past Medical History:  Diagnosis Date   Anemia    Bladder cancer [facility])    urologist--- dr pace   Chronic diastolic (congestive) heart failure (HCC)    followed by dr pietro   Chronic venous insufficiency    Edema of both lower extremities    per pt wears compression hose   GERD (gastroesophageal reflux disease)    History of cancer chemotherapy    completed 2008 for left breast cancer   History of head and neck radiation 1946   per pt approx 1946 or 1947  (age 4) due to 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 patel;   2006  s/p left thyroidectomy w/ right inferior parathyoidectomy  (per path speciman marked parathyroid but only thyroid tissue)   Hypertension    IDA (iron deficiency anemia)    hematology/ oncologist--- dr ennever/ sarah carter NP;  treated w/ IV iron infusions   Multinodular thyroid    Neuropathy    mild hands/ feet, uses cane   Nocturia more than twice per night    Nocturnal leg cramps    OA (osteoarthritis)    knees   OAB (overactive bladder)    Osteoporosis    PAF (paroxysmal atrial fibrillation) [facility])    cardiologist--- dr pietro   PONV (postoperative nausea and vomiting)    Vitamin D  deficiency    Wears glasses    Past Surgical History:  Procedure Laterality Date   BREAST  LUMPECTOMY WITH RADIOACTIVE SEED LOCALIZATION Right 10/25/2021   Procedure: RIGHT BREAST LUMPECTOMY WITH RADIOACTIVE SEED LOCALIZATION;  Surgeon: Vanderbilt Ned, MD;  Location: [facility];  Service: General;  Laterality: Right;   CARDIOVERSION N/A 01/01/2023   Procedure: CARDIOVERSION;  Surgeon: Pietro Redell RAMAN, MD;  Location: [facility] ENDOSCOPY;  Service: Cardiovascular;  Laterality: N/A;   CHOLECYSTECTOMY, LAPAROSCOPIC  02/25/2017   @[facility]    COLONOSCOPY WITH ESOPHAGOGASTRODUODENOSCOPY (EGD)  2022   IR ANGIO INTRA EXTRACRAN SEL COM CAROTID INNOMINATE UNI R MOD SED  02/07/2023   IR ANGIO VERTEBRAL SEL SUBCLAVIAN INNOMINATE UNI L MOD SED  02/07/2023   IR CT HEAD LTD  02/07/2023   IR INTRAVSC STENT CERV CAROTID W/O EMB-PROT MOD SED INC ANGIO  02/07/2023   IR PERCUTANEOUS ART THROMBECTOMY/INFUSION INTRACRANIAL INC DIAG ANGIO  02/07/2023   IR RADIOLOGIST EVAL & MGMT  03/10/2023   MASTECTOMY WITH AXILLARY LYMPH NODE DISSECTION Left 03/05/2007   @ [facility]   OVARIAN CYST SURGERY     age 41;   abdominal   RADIOLOGY WITH ANESTHESIA N/A 02/06/2023   Procedure: IR WITH ANESTHESIA;  Surgeon: Radiologist, Medication, MD;  Location: [facility] OR;  Service: Radiology;  Laterality: N/A;   THYROIDECTOMY, PARTIAL Left 2006   left lobectomy and right infertior parathyroidectomy   TONSILLECTOMY     child   Family History  Problem Relation Age of Onset   Hypertension Mother    Hypertension Father    Hypertension Daughter    Hypertension Son    Sudden death Neg Hx    Social History   Occupational History   Occupation: retired  Tobacco Use   Smoking status: Former    Current packs/day: 0.00    Types: Cigarettes    Quit date: 1969    Years since quitting: 56.9   Smokeless tobacco: Never  Vaping Use   Vaping status: Never Used  Substance and Sexual Activity   Alcohol use: Never   Drug use: Never   Sexual activity: Not Currently    Birth control/protection: Post-menopausal   Tobacco  Counseling Counseling given: Not Answered  SDOH Screenings   Food Insecurity: No Food Insecurity (08/20/2024)  Housing: Low Risk  (08/20/2024)  Transportation Needs: No Transportation Needs (08/20/2024)  Utilities: Not At Risk (08/20/2024)  Alcohol Screen: Low Risk  (01/30/2023)  Depression (PHQ2-9): Low Risk  (08/20/2024)  Financial Resource Strain: Low Risk  (02/27/2023)  Physical Activity: Sufficiently Active (08/20/2024)  Social Connections: Socially Integrated (08/20/2024)  Stress: No Stress Concern Present (08/20/2024)  Tobacco Use: Medium Risk (08/20/2024)   Depression Screen    08/20/2024    9:15 AM 05/01/2023   10:03 AM 03/09/2023   11:12 AM 01/30/2023   10:23 AM 11/20/2022    2:33 PM 11/10/2022    9:05 AM 01/27/2022   10:57 AM  PHQ 2/9 Scores  PHQ - 2 Score 0 0 0 0 0 0 0  PHQ- 9 Score 0  0          Data saved with a previous flowsheet row definition     Goals Addressed             This Visit's Progress    Exercise 3x per week (30 min per time)   On track    Pt states she is trying to exercise more.        Visit info / Clinical Intake: Medicare Wellness Visit Type:: Subsequent Annual Wellness Visit Persons participating in visit:: patient Medicare Wellness Visit Mode:: Telephone If telephone:: video declined Because this visit was a virtual/telehealth visit:: pt reported vitals If Telephone or Video please confirm:: I connected with the patient using audio enabled telemedicine application and verified that I am speaking with the correct person using two identifiers; I discussed the limitations of evaluation and management by telemedicine; The patient expressed understanding and agreed to proceed Patient Location:: home Provider Location:: office Information given by:: patient Interpreter Needed?: No Pre-visit prep was completed: yes AWV questionnaire completed by patient prior to visit?: no Living arrangements:: lives with spouse/significant other Patient's  Overall Health Status Rating: very good Typical amount of pain: none Does pain affect daily life?: no Are you currently prescribed opioids?: no  Dietary Habits and Nutritional Risks How many meals a day?: 3 (2-3) Eats fruit and vegetables daily?: yes Most meals are obtained by: preparing own meals In the last 2 weeks, have you had any of the following?: none Diabetic:: no  Functional Status Activities of Daily Living (to include ambulation/medication): Independent Ambulation: Independent Medication Administration: Independent Home Management: Independent Manage your own finances?: yes Primary transportation is: family/friends (daughter) Concerns about vision?: no *vision screening is required for WT[facility]* (up to date Ozell Sar at [facility]) Concerns about hearing?: no  Fall Screening Falls in the past year?: 0 Number of falls in past year: 0 Was there an injury with Fall?: 0 Fall  Risk Category Calculator: 0 Patient Fall Risk Level: Low Fall Risk  Fall Risk Patient at Risk for Falls Due to: Impaired vision Fall risk Follow up: Falls evaluation completed  Home and Transportation Safety: All rugs have non-skid backing?: (!) no All stairs or steps have railings?: yes Grab bars in the bathtub or shower?: yes Have non-skid surface in bathtub or shower?: (!) no (can place a mat in shower when needed) Good home lighting?: yes Regular seat belt use?: yes Hospital stays in the last year:: no  Cognitive Assessment Difficulty concentrating, remembering, or making decisions? : no Will 6CIT or Mini Cog be Completed: yes What year is it?: 0 points What month is it?: 0 points Give patient an address phrase to remember (5 components): [address], [place]  About what time is it?: 0 points Count backwards from 20 to 1: 0 points Say the months of the year in reverse: 0 points Repeat the address phrase from earlier: 2 points 6 CIT Score: 2 points  Advance Directives (For  Healthcare) Does Patient Have a Medical Advance Directive?: Yes Does patient want to make changes to medical advance directive?: No - Guardian declined Type of Advance Directive: Healthcare Power of [place]; Living will Copy of Healthcare Power of Attorney in Chart?: No - copy requested Copy of Living Will in Chart?: No - copy requested  Reviewed/Updated  Reviewed/Updated: Reviewed All (Medical, Surgical, Family, Medications, Allergies, Care Teams, Patient Goals)        Objective:    Today's Vitals   08/20/24 0902  Weight: 165 lb (74.8 kg)  Height: 4' 10 (1.473 m)   Body mass index is 34.49 kg/m.  Current Medications (verified) Outpatient Encounter Medications as of 08/20/2024  Medication Sig   acetaminophen  (TYLENOL ) 325 MG tablet Take 2 tablets (650 mg total) by mouth every 4 (four) hours as needed for mild pain (or temp > 37.5 C (99.5 F)).   apixaban  (ELIQUIS ) 5 MG TABS tablet TAKE ONE TABLET BY MOUTH TWICE A DAY   benzonatate (TESSALON) 100 MG capsule Take 1 capsule (100 mg total) by mouth 2 (two) times daily as needed for cough.   cinacalcet  (SENSIPAR ) 30 MG tablet Take 1 tablet (30 mg total) by mouth daily with breakfast.   empagliflozin  (JARDIANCE ) 10 MG TABS tablet Take 1 tablet (10 mg total) by mouth daily.   ergocalciferol  (VITAMIN D2) 1.25 MG (50000 UT) capsule Take 50,000 Units by mouth every 14 (fourteen) days. Every other saturday   furosemide  (LASIX ) 20 MG tablet TAKE THREE TABLETS BY MOUTH ONE TIME DAILY , TAKE AN EXTRA TABLET AS NEEDED FOR SWELLING   guaiFENesin (MUCINEX) 600 MG 12 hr tablet Take 2 tablets (1,200 mg total) by mouth 2 (two) times daily.   magnesium  oxide (MAG-OX) 400 (240 Mg) MG tablet Take 1 tablet (400 mg total) by mouth daily.   metoprolol  tartrate (LOPRESSOR ) 50 MG tablet TAKE ONE AND ONE-HALF TABLETS BY MOUTH TWICE A DAY   omeprazole  (PRILOSEC) 40 MG capsule TAKE ONE CAPSULE BY MOUTH TWICE A DAY BEFORE A MEAL   oxybutynin  (DITROPAN -XL) 10  MG 24 hr tablet Take 1 tablet (10 mg total) by mouth in the morning and at bedtime.   potassium chloride  SA (KLOR-CON  M) 20 MEQ tablet Take 1 tablet (20 mEq total) by mouth once for 1 dose. TAKE ONE TABLET BY MOUTH DAILY - USE WHEN TAKING MORE THAN 20 MG OF FUROSEMIDE  DAILY   ticagrelor  (BRILINTA ) 90 MG TABS tablet Take 0.5 tablets (45  mg total) by mouth 2 (two) times daily.   No facility-administered encounter medications on file as of 08/20/2024.   Hearing/Vision screen No results found. Immunizations and Health Maintenance Health Maintenance  Topic Date Due   Mammogram  Never done   Zoster Vaccines- Shingrix (2 of 2) 03/31/2022   COVID-19 Vaccine (8 - Pfizer risk 2025-26 season) 01/05/2025   Medicare Annual Wellness (AWV)  08/20/2025   DTaP/Tdap/Td (4 - Tdap) 01/12/2032   Pneumococcal Vaccine: 50+ Years  Completed   Influenza Vaccine  Completed   DEXA SCAN  Completed   Meningococcal B Vaccine  Aged Out        Assessment/Plan:  This is a routine wellness examination for Anab.  Patient Care Team: Copland, Harlene BROCKS, MD as PCP - General (Family Medicine) Pietro Redell RAMAN, MD as PCP - Cardiology (Cardiology) [place], [facility]  I have personally reviewed and noted the following in the patient's chart:   Medical and social history Use of alcohol, tobacco or illicit drugs  Current medications and supplements including opioid prescriptions. Functional ability and status Nutritional status Physical activity Advanced directives List of other physicians Hospitalizations, surgeries, and ER visits in previous 12 months Vitals Screenings to include cognitive, depression, and falls Referrals and appointments  No orders of the defined types were placed in this encounter.  In addition, I have reviewed and discussed with patient certain preventive protocols, quality metrics, and best practice recommendations. A written personalized care plan for preventive services  as well as general preventive health recommendations were provided to patient.   Lolita Libra, CMA   08/20/2024   Return in 1 year (on 08/20/2025).  After Visit Summary: (MyChart) Due to this being a telephonic visit, the after visit summary with patients personalized plan was offered to patient via MyChart   Nurse Notes: 6 month f/u with PCP scheduled 01/28/25.

## 2024-09-01 MED ORDER — ASPIRIN 81 MG PO TBEC: 81.0000 mg | DELAYED_RELEASE_TABLET | Freq: Every day | ORAL | Status: DC

## 2024-09-15 ENCOUNTER — Other Ambulatory Visit: Payer: Self-pay | Admitting: Family Medicine

## 2024-10-05 ENCOUNTER — Emergency Department (HOSPITAL_BASED_OUTPATIENT_CLINIC_OR_DEPARTMENT_OTHER)

## 2024-10-05 ENCOUNTER — Inpatient Hospital Stay (HOSPITAL_BASED_OUTPATIENT_CLINIC_OR_DEPARTMENT_OTHER)
Admission: EM | Admit: 2024-10-05 | Discharge: 2024-10-08 | DRG: 378 | Disposition: A | Discharge status: Home / Self Care | Attending: Internal Medicine | Admitting: Internal Medicine

## 2024-10-05 ENCOUNTER — Other Ambulatory Visit: Payer: Self-pay

## 2024-10-05 ENCOUNTER — Encounter (HOSPITAL_BASED_OUTPATIENT_CLINIC_OR_DEPARTMENT_OTHER): Payer: Self-pay

## 2024-10-05 ENCOUNTER — Other Ambulatory Visit: Payer: Self-pay | Admitting: Cardiology

## 2024-10-05 DIAGNOSIS — Z9012 Acquired absence of left breast and nipple: Secondary | ICD-10-CM

## 2024-10-05 DIAGNOSIS — I071 Rheumatic tricuspid insufficiency: Secondary | ICD-10-CM | POA: Diagnosis present

## 2024-10-05 DIAGNOSIS — Z170 Estrogen receptor positive status [ER+]: Secondary | ICD-10-CM

## 2024-10-05 DIAGNOSIS — K317 Polyp of stomach and duodenum: Secondary | ICD-10-CM | POA: Diagnosis present

## 2024-10-05 DIAGNOSIS — I69319 Unspecified symptoms and signs involving cognitive functions following cerebral infarction: Secondary | ICD-10-CM

## 2024-10-05 DIAGNOSIS — K31811 Angiodysplasia of stomach and duodenum with bleeding: Principal | ICD-10-CM | POA: Diagnosis present

## 2024-10-05 DIAGNOSIS — Z79899 Other long term (current) drug therapy: Secondary | ICD-10-CM

## 2024-10-05 DIAGNOSIS — K5909 Other constipation: Secondary | ICD-10-CM | POA: Diagnosis present

## 2024-10-05 DIAGNOSIS — I482 Chronic atrial fibrillation, unspecified: Secondary | ICD-10-CM | POA: Diagnosis present

## 2024-10-05 DIAGNOSIS — Z1152 Encounter for screening for COVID-19: Secondary | ICD-10-CM

## 2024-10-05 DIAGNOSIS — K922 Gastrointestinal hemorrhage, unspecified: Secondary | ICD-10-CM | POA: Diagnosis present

## 2024-10-05 DIAGNOSIS — Z6833 Body mass index (BMI) 33.0-33.9, adult: Secondary | ICD-10-CM

## 2024-10-05 DIAGNOSIS — M170 Bilateral primary osteoarthritis of knee: Secondary | ICD-10-CM | POA: Diagnosis present

## 2024-10-05 DIAGNOSIS — Z8249 Family history of ischemic heart disease and other diseases of the circulatory system: Secondary | ICD-10-CM

## 2024-10-05 DIAGNOSIS — K648 Other hemorrhoids: Secondary | ICD-10-CM | POA: Diagnosis present

## 2024-10-05 DIAGNOSIS — Z8673 Personal history of transient ischemic attack (TIA), and cerebral infarction without residual deficits: Secondary | ICD-10-CM

## 2024-10-05 DIAGNOSIS — D509 Iron deficiency anemia, unspecified: Secondary | ICD-10-CM | POA: Diagnosis present

## 2024-10-05 DIAGNOSIS — K2991 Gastroduodenitis, unspecified, with bleeding: Secondary | ICD-10-CM | POA: Diagnosis present

## 2024-10-05 DIAGNOSIS — E876 Hypokalemia: Secondary | ICD-10-CM | POA: Diagnosis present

## 2024-10-05 DIAGNOSIS — R600 Localized edema: Secondary | ICD-10-CM

## 2024-10-05 DIAGNOSIS — Z853 Personal history of malignant neoplasm of breast: Secondary | ICD-10-CM

## 2024-10-05 DIAGNOSIS — I69320 Aphasia following cerebral infarction: Secondary | ICD-10-CM

## 2024-10-05 DIAGNOSIS — D131 Benign neoplasm of stomach: Secondary | ICD-10-CM

## 2024-10-05 DIAGNOSIS — D62 Acute posthemorrhagic anemia: Secondary | ICD-10-CM | POA: Diagnosis present

## 2024-10-05 DIAGNOSIS — I110 Hypertensive heart disease with heart failure: Secondary | ICD-10-CM | POA: Diagnosis present

## 2024-10-05 DIAGNOSIS — H9193 Unspecified hearing loss, bilateral: Secondary | ICD-10-CM | POA: Diagnosis present

## 2024-10-05 DIAGNOSIS — I4819 Other persistent atrial fibrillation: Secondary | ICD-10-CM | POA: Diagnosis present

## 2024-10-05 DIAGNOSIS — K760 Fatty (change of) liver, not elsewhere classified: Secondary | ICD-10-CM | POA: Diagnosis present

## 2024-10-05 DIAGNOSIS — I69351 Hemiplegia and hemiparesis following cerebral infarction affecting right dominant side: Secondary | ICD-10-CM

## 2024-10-05 DIAGNOSIS — Z91010 Allergy to peanuts: Secondary | ICD-10-CM

## 2024-10-05 DIAGNOSIS — Z9221 Personal history of antineoplastic chemotherapy: Secondary | ICD-10-CM

## 2024-10-05 DIAGNOSIS — K219 Gastro-esophageal reflux disease without esophagitis: Secondary | ICD-10-CM | POA: Diagnosis not present

## 2024-10-05 DIAGNOSIS — Z7982 Long term (current) use of aspirin: Secondary | ICD-10-CM

## 2024-10-05 DIAGNOSIS — Z95828 Presence of other vascular implants and grafts: Secondary | ICD-10-CM | POA: Diagnosis not present

## 2024-10-05 DIAGNOSIS — I10 Essential (primary) hypertension: Secondary | ICD-10-CM | POA: Diagnosis present

## 2024-10-05 DIAGNOSIS — Z7901 Long term (current) use of anticoagulants: Secondary | ICD-10-CM

## 2024-10-05 DIAGNOSIS — K5730 Diverticulosis of large intestine without perforation or abscess without bleeding: Secondary | ICD-10-CM | POA: Diagnosis present

## 2024-10-05 DIAGNOSIS — Z7984 Long term (current) use of oral hypoglycemic drugs: Secondary | ICD-10-CM

## 2024-10-05 DIAGNOSIS — I5032 Chronic diastolic (congestive) heart failure: Secondary | ICD-10-CM | POA: Diagnosis present

## 2024-10-05 DIAGNOSIS — I872 Venous insufficiency (chronic) (peripheral): Secondary | ICD-10-CM | POA: Diagnosis present

## 2024-10-05 DIAGNOSIS — Z91030 Bee allergy status: Secondary | ICD-10-CM

## 2024-10-05 DIAGNOSIS — N3281 Overactive bladder: Secondary | ICD-10-CM | POA: Diagnosis present

## 2024-10-05 DIAGNOSIS — M810 Age-related osteoporosis without current pathological fracture: Secondary | ICD-10-CM | POA: Diagnosis present

## 2024-10-05 DIAGNOSIS — Z8551 Personal history of malignant neoplasm of bladder: Secondary | ICD-10-CM

## 2024-10-05 DIAGNOSIS — Z881 Allergy status to other antibiotic agents status: Secondary | ICD-10-CM

## 2024-10-05 DIAGNOSIS — R531 Weakness: Secondary | ICD-10-CM | POA: Diagnosis present

## 2024-10-05 DIAGNOSIS — I63512 Cerebral infarction due to unspecified occlusion or stenosis of left middle cerebral artery: Secondary | ICD-10-CM | POA: Diagnosis present

## 2024-10-05 DIAGNOSIS — E890 Postprocedural hypothyroidism: Secondary | ICD-10-CM | POA: Diagnosis present

## 2024-10-05 DIAGNOSIS — K2970 Gastritis, unspecified, without bleeding: Secondary | ICD-10-CM

## 2024-10-05 DIAGNOSIS — K2960 Other gastritis without bleeding: Secondary | ICD-10-CM | POA: Diagnosis present

## 2024-10-05 DIAGNOSIS — Z87891 Personal history of nicotine dependence: Secondary | ICD-10-CM

## 2024-10-05 DIAGNOSIS — K5520 Angiodysplasia of colon without hemorrhage: Secondary | ICD-10-CM

## 2024-10-05 DIAGNOSIS — D649 Anemia, unspecified: Principal | ICD-10-CM

## 2024-10-05 DIAGNOSIS — E66811 Obesity, class 1: Secondary | ICD-10-CM | POA: Diagnosis present

## 2024-10-05 DIAGNOSIS — N179 Acute kidney failure, unspecified: Secondary | ICD-10-CM | POA: Diagnosis present

## 2024-10-05 DIAGNOSIS — F32A Depression, unspecified: Secondary | ICD-10-CM | POA: Diagnosis present

## 2024-10-05 LAB — TROPONIN T, HIGH SENSITIVITY: Troponin T High Sensitivity: 29 ng/L — ABNORMAL HIGH (ref 0–19)

## 2024-10-05 LAB — CBC
HCT: 21.9 % — ABNORMAL LOW (ref 36.0–46.0)
Hemoglobin: 6.3 g/dL — CL (ref 12.0–15.0)
MCH: 19.7 pg — ABNORMAL LOW (ref 26.0–34.0)
MCHC: 28.8 g/dL — ABNORMAL LOW (ref 30.0–36.0)
MCV: 68.4 fL — ABNORMAL LOW (ref 80.0–100.0)
Platelets: 348 K/uL (ref 150–400)
RBC: 3.2 MIL/uL — ABNORMAL LOW (ref 3.87–5.11)
RDW: 18.1 % — ABNORMAL HIGH (ref 11.5–15.5)
WBC: 10.2 K/uL (ref 4.0–10.5)
nRBC: 0 % (ref 0.0–0.2)

## 2024-10-05 LAB — URINALYSIS, ROUTINE W REFLEX MICROSCOPIC
Bilirubin Urine: NEGATIVE
Glucose, UA: 250 mg/dL — AB
Hgb urine dipstick: NEGATIVE
Ketones, ur: NEGATIVE mg/dL
Nitrite: NEGATIVE
Protein, ur: NEGATIVE mg/dL
Specific Gravity, Urine: 1.015 (ref 1.005–1.030)
pH: 7 (ref 5.0–8.0)

## 2024-10-05 LAB — ABO/RH: ABO/RH(D): A POS

## 2024-10-05 LAB — PREPARE RBC (CROSSMATCH)

## 2024-10-05 LAB — RESP PANEL BY RT-PCR (RSV, FLU A&B, COVID)  RVPGX2
Influenza A by PCR: NEGATIVE
Influenza B by PCR: NEGATIVE
Resp Syncytial Virus by PCR: NEGATIVE
SARS Coronavirus 2 by RT PCR: NEGATIVE

## 2024-10-05 LAB — COMPREHENSIVE METABOLIC PANEL WITH GFR
ALT: 20 U/L (ref 0–44)
AST: 27 U/L (ref 15–41)
Albumin: 4.2 g/dL (ref 3.5–5.0)
Alkaline Phosphatase: 51 U/L (ref 38–126)
Anion gap: 13 (ref 5–15)
BUN: 24 mg/dL — ABNORMAL HIGH (ref 8–23)
CO2: 28 mmol/L (ref 22–32)
Calcium: 10.3 mg/dL (ref 8.9–10.3)
Chloride: 99 mmol/L (ref 98–111)
Creatinine, Ser: 1.32 mg/dL — ABNORMAL HIGH (ref 0.44–1.00)
GFR, Estimated: 39 mL/min — ABNORMAL LOW
Glucose, Bld: 111 mg/dL — ABNORMAL HIGH (ref 70–99)
Potassium: 3.5 mmol/L (ref 3.5–5.1)
Sodium: 140 mmol/L (ref 135–145)
Total Bilirubin: 1 mg/dL (ref 0.0–1.2)
Total Protein: 6.7 g/dL (ref 6.5–8.1)

## 2024-10-05 LAB — HEMOGLOBIN AND HEMATOCRIT, BLOOD
HCT: 20.9 % — ABNORMAL LOW (ref 36.0–46.0)
HCT: 21.1 % — ABNORMAL LOW (ref 36.0–46.0)
Hemoglobin: 6.1 g/dL — CL (ref 12.0–15.0)
Hemoglobin: 6 g/dL — CL (ref 12.0–15.0)

## 2024-10-05 LAB — URINALYSIS, MICROSCOPIC (REFLEX): RBC / HPF: NONE SEEN RBC/hpf (ref 0–5)

## 2024-10-05 LAB — LIPASE, BLOOD: Lipase: 20 U/L (ref 11–51)

## 2024-10-05 LAB — OCCULT BLOOD X 1 CARD TO LAB, STOOL: Fecal Occult Bld: POSITIVE — AB

## 2024-10-05 MED ORDER — SODIUM CHLORIDE 0.9% IV SOLUTION: Freq: Once | INTRAVENOUS | Status: DC

## 2024-10-05 MED ORDER — ONDANSETRON HCL 4 MG PO TABS: 4.0000 mg | ORAL_TABLET | Freq: Four times a day (QID) | ORAL | Status: DC | PRN

## 2024-10-05 MED ORDER — MORPHINE SULFATE (PF) 2 MG/ML IV SOLN: 2.0000 mg | INTRAVENOUS | Status: DC | PRN

## 2024-10-05 MED ORDER — PANTOPRAZOLE SODIUM 40 MG IV SOLR
40.0000 mg | Freq: Two times a day (BID) | INTRAVENOUS | Status: DC
  Administered 2024-10-05 – 2024-10-06 (×4): 40 mg via INTRAVENOUS
  Filled 2024-10-05 (×4): qty 10

## 2024-10-05 MED ORDER — SODIUM CHLORIDE 0.9 % IV BOLUS
1000.0000 mL | Freq: Once | INTRAVENOUS | Status: AC
  Administered 2024-10-05: 1000 mL via INTRAVENOUS

## 2024-10-05 MED ORDER — OXYBUTYNIN CHLORIDE ER 5 MG PO TB24
10.0000 mg | ORAL_TABLET | Freq: Every day | ORAL | Status: DC
  Administered 2024-10-05 – 2024-10-07 (×3): 10 mg via ORAL
  Filled 2024-10-05 (×2): qty 2

## 2024-10-05 MED ORDER — IOHEXOL 300 MG/ML  SOLN
75.0000 mL | Freq: Once | INTRAMUSCULAR | Status: AC | PRN
  Administered 2024-10-05: 75 mL via INTRAVENOUS

## 2024-10-05 MED ORDER — ONDANSETRON HCL 4 MG/2ML IJ SOLN
4.0000 mg | Freq: Four times a day (QID) | INTRAMUSCULAR | Status: DC | PRN
  Administered 2024-10-06 – 2024-10-07 (×2): 4 mg via INTRAVENOUS
  Filled 2024-10-05: qty 2

## 2024-10-05 NOTE — Assessment & Plan Note (Signed)
 2D ECHO 10/2022 w/ EF 55-60%, moderately elevated pulmonary artery systolic pressure, moderately dilated LA, mild to moderate TR  Followed by Dr. Pietro outpatient  Appears euvolemic  Check weight today  Strict Is and Os and daily weights Follow

## 2024-10-05 NOTE — ED Triage Notes (Signed)
 Nausea, dizziness, diarrhea, fatigue, decreased appetite, intermittent chest pain and SHOB for 5 days

## 2024-10-05 NOTE — Assessment & Plan Note (Addendum)
 Acute blood loss anemia  + nausea, dizziness, weakness, fatigue w/ black stools over the past 5+ days  On eliquis  and asa  Hgb 6.3 with baseline hgb 10-13 with baseline IDA  Contrasted CT A&P grossly stable  In discussion w/ EDP Geiple PA- case discussed w/ [facility] PA Guenther with plan for formal consultation on arrival to [facility]  IV PPI  Trend hgb  Hold AC and antiplatelet regimen  Plan for transfusion on arrival to the floor Follow up GI recommendations

## 2024-10-05 NOTE — Progress Notes (Signed)
 The patient was seen and examined at her bedside.  Denies having any bowel movements today.  1U PRBCs was ordered to be transfused for Hg 6.1K.  Has some epigastric abdominal tenderness on exam.  No nausea and states she is hungry.  Heart healthy diet ordered.  NPO after midnight.  The patient is on IV PPI BID.  Home Eliquis  and aspirin  on hold due to GI bleed and acute blood loss anemia.  The patient's daughter was present in the room.  All questions answered to the best of my ability.  Time: 15 minutes.

## 2024-10-05 NOTE — ED Provider Notes (Signed)
 " [place] [facility] Provider Note   CSN: [id number] Arrival date & time: 10/05/24  9051     Patient presents with: Nausea and Dizziness   Loretta Gates is a 88 y.o. female.   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  Patient has been not at her baseline since 12/23.  For the most part she has had generalized nausea, decreased energy, spending a lot of the day in bed.  She also reports a dizzy sensation that is new for her described as a head shaking.  She has had some watery stool over the past few days.  She has tried Pepto-Bismol and Tums without much improvement.  No vision changes as with previous stroke.  She denies chest pain or shortness of breath.  No fevers.  No ear pain, sore throat, although a family member recently did have strep.  No cough or shortness of breath.  No lower extremity swelling.       Prior to Admission medications  Medication Sig Start Date End Date Taking? Authorizing Provider  acetaminophen  (TYLENOL ) 325 MG tablet Take 2 tablets (650 mg total) by mouth every 4 (four) hours as needed for mild pain (or temp > 37.5 C (99.5 F)). 02/22/23   Angiulli, Toribio PARAS, PA-C  apixaban  (ELIQUIS ) 5 MG TABS tablet TAKE ONE TABLET BY MOUTH TWICE A DAY 01/22/24   Copland, Harlene BROCKS, MD  aspirin  EC 81 MG tablet Take 1 tablet (81 mg total) by mouth daily. Swallow whole. 09/01/24   Whitfield Harlene, NP  benzonatate  (TESSALON ) 100 MG capsule Take 1 capsule (100 mg total) by mouth 2 (two) times daily as needed for cough. 08/14/24   Almarie Waddell NOVAK, NP  cinacalcet  (SENSIPAR ) 30 MG tablet Take 1 tablet (30 mg total) by mouth daily with breakfast. 02/24/23   Setzer, Nena PARAS, PA-C  empagliflozin  (JARDIANCE ) 10 MG TABS  tablet Take 1 tablet (10 mg total) by mouth daily. 01/30/24   Copland, Jessica C, MD  ergocalciferol  (VITAMIN D2) 1.25 MG (50000 UT) capsule Take 50,000 Units by mouth every 14 (fourteen) days. Every other saturday    [provider]  furosemide  (LASIX ) 20 MG tablet TAKE THREE TABLETS BY MOUTH ONE TIME DAILY , TAKE AN EXTRA TABLET AS NEEDED FOR SWELLING 04/09/24   Pietro Redell RAMAN, MD  guaiFENesin  (MUCINEX ) 600 MG 12 hr tablet Take 2 tablets (1,200 mg total) by mouth 2 (two) times daily. 08/14/24   Almarie Waddell NOVAK, NP  magnesium  oxide (MAG-OX) 400 (240 Mg) MG tablet TAKE ONE TABLET BY MOUTH ONE TIME DAILY 09/15/24   Copland, Jessica C, MD  metoprolol  tartrate (LOPRESSOR ) 50 MG tablet TAKE ONE AND ONE-HALF TABLETS BY MOUTH TWICE A DAY 01/30/24   Copland, Harlene BROCKS, MD  omeprazole  (PRILOSEC) 40 MG capsule TAKE ONE CAPSULE BY MOUTH TWICE A DAY BEFORE A MEAL 07/04/24   Copland, Harlene BROCKS, MD  oxybutynin  (DITROPAN -XL) 10 MG 24 hr tablet Take 1 tablet (10 mg total) by mouth in the morning and at bedtime. 07/30/24   Copland, Harlene BROCKS, MD  potassium chloride  SA (KLOR-CON  M) 20 MEQ tablet Take 1 tablet (20 mEq total) by mouth once for 1 dose. TAKE ONE TABLET BY MOUTH DAILY - USE WHEN TAKING MORE THAN 20 MG OF FUROSEMIDE  DAILY  01/30/24 08/20/24  Copland, Harlene BROCKS, MD    Allergies: Bee venom, Levaquin [levofloxacin], and Peanut-containing drug products    Review of Systems  Updated Vital Signs BP 121/61 (BP Location: Right Arm)   Pulse 81   Temp 98 F (36.7 C)   Resp 16   SpO2 98%   Physical Exam Vitals and nursing note reviewed. Exam conducted with a chaperone present.  Constitutional:      General: She is not in acute distress.    Appearance: She is well-developed. She is not diaphoretic.  HENT:     Head: Normocephalic and atraumatic.     Right Ear: External ear normal.     Left Ear: External ear normal.     Nose: Nose normal.     Mouth/Throat:     Mouth: Mucous membranes are not dry.   Eyes:     Conjunctiva/sclera: Conjunctivae normal.  Neck:     Vascular: Normal carotid pulses. No JVD.     Trachea: Trachea normal. No tracheal deviation.  Cardiovascular:     Rate and Rhythm: Normal rate and regular rhythm.     Pulses: No decreased pulses.          Radial pulses are 2+ on the right side and 2+ on the left side.     Heart sounds: Normal heart sounds, S1 normal and S2 normal. No murmur heard. Pulmonary:     Effort: Pulmonary effort is normal. No respiratory distress.     Breath sounds: No wheezing, rhonchi or rales.  Chest:     Chest wall: No tenderness.  Abdominal:     General: Bowel sounds are normal.     Palpations: Abdomen is soft.     Tenderness: There is no abdominal tenderness. There is no guarding or rebound.  Genitourinary:    Rectum: Guaiac result positive.     Comments: Small amount of very dark stool noted on finger, no large-volume bleeding.  Some skin irritation noted around the rectum, without active bleeding. Musculoskeletal:        General: Normal range of motion.     Cervical back: Normal range of motion and neck supple. No muscular tenderness.     Right lower leg: No edema.     Left lower leg: No edema.  Skin:    General: Skin is warm and dry.     Coloration: Skin is not pale.     Findings: No rash.  Neurological:     General: No focal deficit present.     Mental Status: She is alert. Mental status is at baseline.     Motor: No weakness.  Psychiatric:        Mood and Affect: Mood normal.     (all labs ordered are listed, but only abnormal results are displayed) Labs Reviewed  COMPREHENSIVE METABOLIC PANEL WITH GFR - Abnormal; Notable for the following components:      Result Value   Glucose, Bld 111 (*)    BUN 24 (*)    Creatinine, Ser 1.32 (*)    GFR, Estimated 39 (*)    All other components within normal limits  CBC - Abnormal; Notable for the following components:   RBC 3.20 (*)    Hemoglobin 6.3 (*)    HCT 21.9 (*)    MCV  68.4 (*)    MCH 19.7 (*)    MCHC 28.8 (*)    RDW 18.1 (*)    All other components within normal limits  URINALYSIS, ROUTINE W  REFLEX MICROSCOPIC - Abnormal; Notable for the following components:   Color, Urine STRAW (*)    Glucose, UA 250 (*)    Leukocytes,Ua TRACE (*)    All other components within normal limits  OCCULT BLOOD X 1 CARD TO LAB, STOOL - Abnormal; Notable for the following components:   Fecal Occult Bld POSITIVE (*)    All other components within normal limits  URINALYSIS, MICROSCOPIC (REFLEX) - Abnormal; Notable for the following components:   Bacteria, UA RARE (*)    All other components within normal limits  TROPONIN T, HIGH SENSITIVITY - Abnormal; Notable for the following components:   Troponin T High Sensitivity 29 (*)    All other components within normal limits  RESP PANEL BY RT-PCR (RSV, FLU A&B, COVID)  RVPGX2  LIPASE, BLOOD    EKG: EKG Interpretation Date/Time:  Sunday October 05 2024 10:08:18 EST Ventricular Rate:  92 PR Interval:    QRS Duration:  87 QT Interval:  437 QTC Calculation: 505 R Axis:   38  Text Interpretation: Atrial fibrillation Borderline repolarization abnormality Prolonged QT interval Confirmed by Cottie Cough [phone]) on 10/05/2024 10:46:00 AM  Radiology: [facility] Chest Portable 1 View Result Date: 10/05/2024 CLINICAL DATA:  Shortness of breath.  Disease.  Chest pain. EXAM: PORTABLE CHEST 1 VIEW COMPARISON:  08/14/2024 FINDINGS: Stable mild cardiomegaly. Both lungs are clear. Surgical clips noted in left axilla and chest wall. IMPRESSION: Mild cardiomegaly. No active lung disease. Electronically Signed   By: Norleen DELENA Kil M.D.   On: 10/05/2024 11:39     Procedures   Medications Ordered in the ED  sodium chloride  0.9 % bolus 1,000 mL (1,000 mLs Intravenous New Bag/Given 10/05/24 1154)   ED Course  Patient seen and examined. History obtained directly from patient and family members.  I reviewed outpatient notes as  well.  Labs/EKG: Ordered in triage including CBC, CMP, lipase, UA, viral panel, troponin, EKG.  EKG personally reviewed and interpreted as above.  Imaging: Ordered chest x-ray.  Medications/Fluids: None ordered  Most recent vital signs reviewed and are as follows: BP 132/70   Pulse 85   Temp 98 F (36.7 C)   Resp 16   SpO2 100%   Initial impression: Weakness and dizziness, no focal neuro deficits  12:00 PM Reassessment performed. Patient appears stable.  Hemoccult obtained with EMT-P Wes as chaperone.   Labs personally reviewed and interpreted including: CBC with normal white blood cell count, hemoglobin critically low at 6.3 with an MCV of 68.4; CMP normal electrolytes, creatinine elevated from baseline at 1.32 with a BUN of 24; lipase normal; troponin elevated at 29; UA without signs of infection.  Hemoccult returned positive.  Imaging personally visualized and interpreted including: Chest x-ray, agree negative for infiltrate.  I have added CT abdomen pelvis.  Reviewed pertinent lab work and imaging with patient at bedside. Questions answered.   Most current vital signs reviewed and are as follows: BP 132/70   Pulse 85   Temp 98 F (36.7 C)   Resp 16   SpO2 100%   Plan: Will need admission for low hemoglobin and GI bleeding.  Patient is on anticoagulation.  Currently volume of bleeding is low and patient appears stable.  I do not think that she needs emergently reversed.  Discussed case with Dr. Cottie.   2:51 PM Secure chat with Kerman RIGGERS with [facility].  Plan consult tomorrow.  Advise twice daily Protonix .  N.p.o. after midnight in case of upper endoscopy.  CT personally  reviewed and interpreted, agree no mass, perforation, obvious infection.  3:37 PM patient and husband updated.  Currently stable.  Case discussed with Dr. Eldonna with [facility] hospitalist, accepts for admission.                                  Medical Decision Making Amount and/or Complexity of  Data Reviewed Labs: ordered. Radiology: ordered.  Risk Prescription drug management.   The following differentials were considered for this patient's suspected GI bleed: Esophagogastric varices: Often a history of alcohol abuse, cirrhosis, ascites, prior UGIB Peptic ulcer disease: Often a history of nonsteroidal anti-inflammatory drug (NSAID) use, Helicobacter pylori infection Vascular anomalies: Arteriovenous malformation (AVM) by history, Angiodysplasia Mallory-Weiss tear: Longitudinal tear of the esophageal mucosa in setting of forceful vomiting Aortoenteric fistula: Often history of massive bleeding, history of aortic procedure Dieulafoy lesion (submucosal artery lesion) Malignancy  The following differential diagnoses were considered for this patient's GI bleed: *Hemorrhoids - can be painless, blood noted on toilet paper *Anal fissure - tearing pain with defecation, small amount of blood *Colonic polyp - usually painless *Proctitis - usually associated with passage of mucus and diarrhea *IBD - presents with crampy abdominal pain, tenesmus, fever *Infectious diarrhea - usually abrupt onset* *Diverticulosis - large volume of painless bleeding, >40yo *Mesenteric ischemia - >50yo, underlying cardiovascular disease, pain out of proportion *Colon cancer *Arteriovenous malformation  Patient has a new anemia, is symptomatic, will require blood transfusion.  Admission to the hospital for further evaluation and undermedication of underlying cause.  CT reassuring without signs of infection.       Final diagnoses:  Symptomatic anemia  Gastrointestinal hemorrhage, unspecified gastrointestinal hemorrhage type  Generalized weakness    ED Discharge Orders     None          Desiderio Chew, PA-C 10/05/24 1539  "

## 2024-10-05 NOTE — H&P (Signed)
 " History and Physical   Referring Provider: Sidra Ruby PA  Telemedicine Provider: Elspeth Masters MD  Provider Location: [facility] [place]  Patient Location: [facility] [place]  Referring Diagnosis: GIB  Patient Name and DOB verified: Yes  Patient consented to Telemedicine Evaluation:yes  RN virtual assistant: Megan Lares Paramedic Video encounter time and date:10/05/24 3:40 om   Additional Information:  Transfusion: pending pRBC transfusion  CPAP/BIPAP: no O2:no  Foley: no  Patient: Loretta Gates FMW:[id number] DOB: 07-May-1936 DOA: 10/05/2024 DOS: the patient was seen and examined on 10/05/2024 PCP: [name], MD  Patient coming from: Home  Chief Complaint:  Chief Complaint  Patient presents with   Nausea   Dizziness   HPI: Loretta Gates is a 88 y.o. 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 Pt reports nausea, dizziness, diarrhea fatigue and SOB over the past 4-5 days. Pt reports that symptoms progressively worsened over this time frame. Pt/husband deny any prior episodes like this. No recent strenuous activity. No reported excessive NSAID use apart from regular eliquis  and baby ASA use. No reported fevers or chills. Minimal abdominal pain. ? Intermittent diarrhea. Dark vs. Black in morphology though with noted recent pepto-bismol use. Minimal chest pain. Noted chronic R sided deficits from CVA 10/2022. No reported decompensation. No reported orthopnea or PND.  Presented to the ER, afebrile, hemodynamically stable.  Satting well on room air.  White count 7.2, hemoglobin 6.3, platelets 348.  132.  Troponin 20s.  Hemoccult positive.  CT of the abdomen pelvis with contrast grossly WNL. Per EDP Sidra Ruby PA, case was discussed w/ [facility] PA Kerman who recommended IV PPI and will formal consult  on arrival to [facility].   Review of Systems: As mentioned in the history of present illness. All other systems reviewed and are negative. 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 patel;   2006  s/p left thyroidectomy w/ right inferior parathyoidectomy  (per path speciman marked parathyroid but only thyroid tissue)   Hypertension    IDA (iron  deficiency anemia)    hematology/ oncologist--- dr ennever/ sarah carter NP;  treated w/ IV iron  infusions   Multinodular thyroid    Neuropathy    mild hands/ feet, uses cane   Nocturia more than twice per night    Nocturnal leg cramps  OA (osteoarthritis)    knees   OAB (overactive bladder)    Osteoporosis    PAF (paroxysmal atrial fibrillation) [facility])    cardiologist--- dr pietro   PONV (postoperative nausea and vomiting)    Vitamin D  deficiency    Wears glasses    Past Surgical History:  Procedure Laterality Date   BREAST LUMPECTOMY WITH RADIOACTIVE SEED LOCALIZATION Right 10/25/2021   Procedure: RIGHT BREAST LUMPECTOMY WITH RADIOACTIVE SEED LOCALIZATION;  Surgeon:  Vanderbilt Ned, MD;  Location: [facility];  Service: General;  Laterality: Right;   CARDIOVERSION N/A 01/01/2023   Procedure: CARDIOVERSION;  Surgeon: Pietro Redell RAMAN, MD;  Location: [facility] ENDOSCOPY;  Service: Cardiovascular;  Laterality: N/A;   CHOLECYSTECTOMY, LAPAROSCOPIC  02/25/2017   @[facility]    COLONOSCOPY WITH ESOPHAGOGASTRODUODENOSCOPY (EGD)  2022   IR ANGIO INTRA EXTRACRAN SEL COM CAROTID INNOMINATE UNI R MOD SED  02/07/2023   IR ANGIO VERTEBRAL SEL SUBCLAVIAN INNOMINATE UNI L MOD SED  02/07/2023   IR CT HEAD LTD  02/07/2023   IR INTRAVSC STENT CERV CAROTID W/O EMB-PROT MOD SED INC ANGIO  02/07/2023   IR PERCUTANEOUS ART THROMBECTOMY/INFUSION INTRACRANIAL INC DIAG ANGIO  02/07/2023   IR RADIOLOGIST EVAL & MGMT  03/10/2023   MASTECTOMY WITH AXILLARY LYMPH NODE DISSECTION Left 03/05/2007   @ [facility]   OVARIAN CYST SURGERY     age 34;   abdominal   RADIOLOGY WITH ANESTHESIA N/A 02/06/2023   Procedure: IR WITH ANESTHESIA;  Surgeon: Radiologist, Medication, MD;  Location: [facility] OR;  Service: Radiology;  Laterality: N/A;   THYROIDECTOMY, PARTIAL Left 2006   left lobectomy and right infertior parathyroidectomy   TONSILLECTOMY     child   Social History:  reports that she quit smoking about 57 years ago. Her smoking use included cigarettes. She has never used smokeless tobacco. She reports that she does not drink alcohol and does not use drugs.  Allergies[1]  Family History  Problem Relation Age of Onset   Hypertension Mother    Hypertension Father    Hypertension Daughter    Hypertension Son    Sudden death Neg Hx     Prior to Admission medications  Medication Sig Start Date End Date Taking? Authorizing Provider  acetaminophen  (TYLENOL ) 325 MG tablet Take 2 tablets (650 mg total) by mouth every 4 (four) hours as needed for mild pain (or temp > 37.5 C (99.5 F)). 02/22/23   Angiulli, Toribio PARAS, PA-C  apixaban  (ELIQUIS ) 5 MG TABS tablet TAKE ONE TABLET BY MOUTH TWICE A DAY 01/22/24    Copland, Harlene BROCKS, MD  aspirin  EC 81 MG tablet Take 1 tablet (81 mg total) by mouth daily. Swallow whole. 09/01/24   Whitfield Harlene, NP  benzonatate  (TESSALON ) 100 MG capsule Take 1 capsule (100 mg total) by mouth 2 (two) times daily as needed for cough. 08/14/24   Almarie Waddell NOVAK, NP  cinacalcet  (SENSIPAR ) 30 MG tablet Take 1 tablet (30 mg total) by mouth daily with breakfast. 02/24/23   Setzer, Nena PARAS, PA-C  empagliflozin  (JARDIANCE ) 10 MG TABS tablet Take 1 tablet (10 mg total) by mouth daily. 01/30/24   Copland, Jessica C, MD  ergocalciferol  (VITAMIN D2) 1.25 MG (50000 UT) capsule Take 50,000 Units by mouth every 14 (fourteen) days. Every other saturday    [provider]  furosemide  (LASIX ) 20 MG tablet TAKE THREE TABLETS BY MOUTH ONE TIME DAILY , TAKE AN EXTRA TABLET AS NEEDED FOR SWELLING 04/09/24   Pietro, Redell RAMAN, MD  guaiFENesin  (  MUCINEX ) 600 MG 12 hr tablet Take 2 tablets (1,200 mg total) by mouth 2 (two) times daily. 08/14/24   Almarie Waddell NOVAK, NP  magnesium  oxide (MAG-OX) 400 (240 Mg) MG tablet TAKE ONE TABLET BY MOUTH ONE TIME DAILY 09/15/24   Copland, Jessica C, MD  metoprolol  tartrate (LOPRESSOR ) 50 MG tablet TAKE ONE AND ONE-HALF TABLETS BY MOUTH TWICE A DAY 01/30/24   Copland, Harlene BROCKS, MD  omeprazole  (PRILOSEC) 40 MG capsule TAKE ONE CAPSULE BY MOUTH TWICE A DAY BEFORE A MEAL 07/04/24   Copland, Harlene BROCKS, MD  oxybutynin  (DITROPAN -XL) 10 MG 24 hr tablet Take 1 tablet (10 mg total) by mouth in the morning and at bedtime. 07/30/24   Copland, Harlene BROCKS, MD  potassium chloride  SA (KLOR-CON  M) 20 MEQ tablet Take 1 tablet (20 mEq total) by mouth once for 1 dose. TAKE ONE TABLET BY MOUTH DAILY - USE WHEN TAKING MORE THAN 20 MG OF FUROSEMIDE  DAILY 01/30/24 08/20/24  Copland, Harlene BROCKS, MD    Physical Exam: Vitals:   10/05/24 1030 10/05/24 1330 10/05/24 1430 10/05/24 1457  BP: 132/70 126/70 119/61   Pulse: 85 84 (!) 58   Resp:  14 (!) 21   Temp:    98.3 F (36.8 C)  TempSrc:     Oral  SpO2: 100% 95% 95%    Physical Exam Nursing note reviewed: Physical exam including cardiac, pulmonary, abdominal and skin exam performed bu [facility] paramedic.  Constitutional:      Appearance: She is obese.  HENT:     Head: Normocephalic and atraumatic.     Nose: Nose normal.     Mouth/Throat:     Mouth: Mucous membranes are moist.  Eyes:     Pupils: Pupils are equal, round, and reactive to light.  Cardiovascular:     Rate and Rhythm: Normal rate. Rhythm irregular.  Pulmonary:     Effort: Pulmonary effort is normal.  Abdominal:     General: Bowel sounds are normal.  Musculoskeletal:        General: Normal range of motion.  Skin:    General: Skin is warm.  Neurological:     General: No focal deficit present.  Psychiatric:        Mood and Affect: Mood normal.     Data Reviewed:  There are no new results to review at this time.  CT ABDOMEN PELVIS W CONTRAST CLINICAL DATA:  Abdominal pain, acute, nonlocalized  EXAM: CT ABDOMEN AND PELVIS WITH CONTRAST  TECHNIQUE: Multidetector CT imaging of the abdomen and pelvis was performed using the standard protocol following bolus administration of intravenous contrast.  RADIATION DOSE REDUCTION: This exam was performed according to the departmental dose-optimization program which includes automated exposure control, adjustment of the mA and/or kV according to patient size and/or use of iterative reconstruction technique.  CONTRAST:  75mL OMNIPAQUE  IOHEXOL  300 MG/ML  SOLN  COMPARISON:  CT abdomen/pelvis dated 04/25/2024.  FINDINGS: Evaluation is slightly limited by motion degradation.  Lower chest: No acute abnormality.  Cardiomegaly.  Hepatobiliary: Similar irregularity of the liver margins could reflect cirrhosis. No focal liver abnormality is seen. Status post cholecystectomy. No biliary dilatation.  Pancreas: Unremarkable. No pancreatic ductal dilatation or surrounding inflammatory changes.  Spleen:  Normal in size without focal abnormality.  Adrenals/Urinary Tract: Adrenal glands are unremarkable. No hydronephrosis or urolithiasis. 1.2 cm simple cyst at the inferior pole of the left kidney, for which no follow-up imaging is recommended. Bladder is unremarkable.  Stomach/Bowel: Stomach is underdistended  limiting detailed evaluation. No obstruction. Small bowel is grossly unremarkable. Appendix appears normal. Descending and sigmoid colonic diverticulosis without evidence of acute diverticulitis.  Vascular/Lymphatic: Nonaneurysmal abdominal aorta with atherosclerotic calcification. No pathologically enlarged lymph nodes identified in the field of view. Similar stable prominent bilateral inguinal lymph nodes not definitively enlarged by size criteria.  Reproductive: Uterus and bilateral adnexa are unremarkable.  Other: Trace pelvic free fluid. No intraperitoneal free air. Similar small fat containing umbilical hernia.  Musculoskeletal: No acute osseous abnormality. Multilevel degenerative changes of the thoracolumbar spine. Redemonstrated hemangioma in T10 vertebral body.  IMPRESSION: 1. Trace pelvic free fluid of uncertain etiology. 2. Descending and sigmoid colonic diverticulosis without evidence of acute diverticulitis. 3. Similar irregularity of the liver margins could reflect cirrhosis. 4.  Aortic Atherosclerosis (ICD10-I70.0).  Electronically Signed   By: Harrietta Sherry M.D.   On: 10/05/2024 14:06 DG Chest Portable 1 View CLINICAL DATA:  Shortness of breath.  Disease.  Chest pain.  EXAM: PORTABLE CHEST 1 VIEW  COMPARISON:  08/14/2024  FINDINGS: Stable mild cardiomegaly. Both lungs are clear. Surgical clips noted in left axilla and chest wall.  IMPRESSION: Mild cardiomegaly. No active lung disease.  Electronically Signed   By: Norleen DELENA Kil M.D.   On: 10/05/2024 11:39  Lab Results  Component Value Date   WBC 10.2 10/05/2024   HGB 6.1 (LL) 10/05/2024    HCT 20.9 (L) 10/05/2024   MCV 68.4 (L) 10/05/2024   PLT 348 10/05/2024   Last metabolic panel Lab Results  Component Value Date   GLUCOSE 111 (H) 10/05/2024   NA 140 10/05/2024   K 3.5 10/05/2024   CL 99 10/05/2024   CO2 28 10/05/2024   BUN 24 (H) 10/05/2024   CREATININE 1.32 (H) 10/05/2024   GFRNONAA 39 (L) 10/05/2024   CALCIUM 10.3 10/05/2024   PROT 6.7 10/05/2024   ALBUMIN 4.2 10/05/2024   BILITOT 1.0 10/05/2024   ALKPHOS 51 10/05/2024   AST 27 10/05/2024   ALT 20 10/05/2024   ANIONGAP 13 10/05/2024    Assessment and Plan: * GIB (gastrointestinal bleeding) Acute blood loss anemia  + nausea, dizziness, weakness, fatigue w/ black stools over the past 5+ days  On eliquis  and asa  Hgb 6.3 with baseline hgb 10-13 with baseline IDA  Contrasted CT A&P grossly stable  In discussion w/ EDP Geiple PA- case discussed w/ [facility] PA Guenther with plan for formal consultation on arrival to [facility]  IV PPI  Trend hgb  Hold AC and antiplatelet regimen  Plan for transfusion on arrival to the floor Follow up GI recommendations  Essential hypertension BP 120s/60s  Hold BP regimen for now in setting of active GI bleed   Chronic heart failure with preserved ejection fraction (HFpEF) (HCC) 2D ECHO 10/2022 w/ EF 55-60%, moderately elevated pulmonary artery systolic pressure, moderately dilated LA, mild to moderate TR  Followed by Dr. 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 Observation status is judged to be reasonable and necessary in order to provide the required intensity of service to ensure the patient's safety. The patient's presenting symptoms, physical exam findings, and initial radiographic and laboratory data in the context of their medical condition is felt to place them at decreased risk for further clinical deterioration. Furthermore, it is anticipated that the patient will be medically stable for discharge from the hospital within 2 midnights of admission.   Author: Elspeth JINNY Masters, MD 10/05/2024 5:25 PM  For on call review [email or web address].     [1]  Allergies Allergen Reactions   Bee Venom Swelling    Bee stung her on the inside of her gum. Lips and gums started swelling. No SOB.   Levaquin [Levofloxacin] Hives and Itching   Peanut-Containing Drug Products Other (See Comments)    Stomach pain and diarrhea    "

## 2024-10-05 NOTE — ED Notes (Signed)
 ED Provider at bedside.

## 2024-10-05 NOTE — Assessment & Plan Note (Addendum)
 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  On eliquis  and asa  Holding in setting of active GIB  Follow closely

## 2024-10-05 NOTE — Assessment & Plan Note (Signed)
 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

## 2024-10-05 NOTE — Assessment & Plan Note (Signed)
 BP 120s/60s  Hold BP regimen for now in setting of active GI bleed

## 2024-10-05 NOTE — Assessment & Plan Note (Signed)
 Rate controlled at present  Holding eliquis  in setting of GIB  Follow

## 2024-10-05 NOTE — ED Notes (Signed)
 Spent 20+ minutes discussing treatments and the plan of care with patient's daughter, as she missed the hospitalist consult.

## 2024-10-05 NOTE — ED Notes (Signed)
 Called [facility] for transport to [facility] @17 :29.   Spoke with Zachary

## 2024-10-06 DIAGNOSIS — Z853 Personal history of malignant neoplasm of breast: Secondary | ICD-10-CM

## 2024-10-06 DIAGNOSIS — D509 Iron deficiency anemia, unspecified: Secondary | ICD-10-CM

## 2024-10-06 DIAGNOSIS — K922 Gastrointestinal hemorrhage, unspecified: Secondary | ICD-10-CM | POA: Diagnosis not present

## 2024-10-06 DIAGNOSIS — I63512 Cerebral infarction due to unspecified occlusion or stenosis of left middle cerebral artery: Secondary | ICD-10-CM

## 2024-10-06 DIAGNOSIS — R932 Abnormal findings on diagnostic imaging of liver and biliary tract: Secondary | ICD-10-CM | POA: Diagnosis not present

## 2024-10-06 DIAGNOSIS — Z7901 Long term (current) use of anticoagulants: Secondary | ICD-10-CM

## 2024-10-06 DIAGNOSIS — R195 Other fecal abnormalities: Secondary | ICD-10-CM

## 2024-10-06 DIAGNOSIS — D649 Anemia, unspecified: Principal | ICD-10-CM

## 2024-10-06 DIAGNOSIS — N179 Acute kidney failure, unspecified: Secondary | ICD-10-CM | POA: Diagnosis not present

## 2024-10-06 DIAGNOSIS — I482 Chronic atrial fibrillation, unspecified: Secondary | ICD-10-CM | POA: Diagnosis not present

## 2024-10-06 DIAGNOSIS — I10 Essential (primary) hypertension: Secondary | ICD-10-CM

## 2024-10-06 DIAGNOSIS — I5032 Chronic diastolic (congestive) heart failure: Secondary | ICD-10-CM

## 2024-10-06 LAB — PREPARE RBC (CROSSMATCH)

## 2024-10-06 LAB — CBC
HCT: 26.9 % — ABNORMAL LOW (ref 36.0–46.0)
Hemoglobin: 8.3 g/dL — ABNORMAL LOW (ref 12.0–15.0)
MCH: 22.8 pg — ABNORMAL LOW (ref 26.0–34.0)
MCHC: 30.9 g/dL (ref 30.0–36.0)
MCV: 73.9 fL — ABNORMAL LOW (ref 80.0–100.0)
Platelets: 305 K/uL (ref 150–400)
RBC: 3.64 MIL/uL — ABNORMAL LOW (ref 3.87–5.11)
RDW: 19.7 % — ABNORMAL HIGH (ref 11.5–15.5)
WBC: 8.3 K/uL (ref 4.0–10.5)
nRBC: 0 % (ref 0.0–0.2)

## 2024-10-06 LAB — COMPREHENSIVE METABOLIC PANEL WITH GFR
ALT: 19 U/L (ref 0–44)
AST: 26 U/L (ref 15–41)
Albumin: 4 g/dL (ref 3.5–5.0)
Alkaline Phosphatase: 56 U/L (ref 38–126)
Anion gap: 13 (ref 5–15)
BUN: 20 mg/dL (ref 8–23)
CO2: 28 mmol/L (ref 22–32)
Calcium: 10.2 mg/dL (ref 8.9–10.3)
Chloride: 104 mmol/L (ref 98–111)
Creatinine, Ser: 1.25 mg/dL — ABNORMAL HIGH (ref 0.44–1.00)
GFR, Estimated: 42 mL/min — ABNORMAL LOW
Glucose, Bld: 95 mg/dL (ref 70–99)
Potassium: 3.3 mmol/L — ABNORMAL LOW (ref 3.5–5.1)
Sodium: 145 mmol/L (ref 135–145)
Total Bilirubin: 1.5 mg/dL — ABNORMAL HIGH (ref 0.0–1.2)
Total Protein: 6.5 g/dL (ref 6.5–8.1)

## 2024-10-06 LAB — IRON AND TIBC
Iron: 67 ug/dL (ref 28–170)
Saturation Ratios: 14 % (ref 10.4–31.8)
TIBC: 477 ug/dL — ABNORMAL HIGH (ref 250–450)
UIBC: 411 ug/dL

## 2024-10-06 LAB — VITAMIN B12: Vitamin B-12: 1177 pg/mL — ABNORMAL HIGH (ref 180–914)

## 2024-10-06 LAB — HEMOGLOBIN AND HEMATOCRIT, BLOOD
HCT: 22.9 % — ABNORMAL LOW (ref 36.0–46.0)
HCT: 30 % — ABNORMAL LOW (ref 36.0–46.0)
Hemoglobin: 7 g/dL — ABNORMAL LOW (ref 12.0–15.0)
Hemoglobin: 9 g/dL — ABNORMAL LOW (ref 12.0–15.0)

## 2024-10-06 LAB — FOLATE: Folate: 9.1 ng/mL

## 2024-10-06 LAB — MAGNESIUM: Magnesium: 2.3 mg/dL (ref 1.7–2.4)

## 2024-10-06 LAB — FERRITIN: Ferritin: 8 ng/mL — ABNORMAL LOW (ref 11–307)

## 2024-10-06 LAB — RETICULOCYTES
Immature Retic Fract: 28 % — ABNORMAL HIGH (ref 2.3–15.9)
RBC.: 3.63 MIL/uL — ABNORMAL LOW (ref 3.87–5.11)
Retic Count, Absolute: 47.9 K/uL (ref 19.0–186.0)
Retic Ct Pct: 1.3 % (ref 0.4–3.1)

## 2024-10-06 MED ORDER — SODIUM CHLORIDE 0.9% IV SOLUTION: Freq: Once | INTRAVENOUS | Status: AC

## 2024-10-06 MED ORDER — ORAL CARE MOUTH RINSE: 15.0000 mL | OROMUCOSAL | Status: DC | PRN

## 2024-10-06 MED ORDER — METOPROLOL TARTRATE 50 MG PO TABS
75.0000 mg | ORAL_TABLET | Freq: Two times a day (BID) | ORAL | Status: DC
  Administered 2024-10-06 – 2024-10-08 (×5): 75 mg via ORAL
  Filled 2024-10-06 (×5): qty 1

## 2024-10-06 MED ORDER — POLYETHYLENE GLYCOL 3350 17 G PO PACK: 17.0000 g | PACK | Freq: Two times a day (BID) | ORAL | Status: DC | PRN

## 2024-10-06 MED ORDER — POTASSIUM CHLORIDE CRYS ER 20 MEQ PO TBCR
60.0000 meq | EXTENDED_RELEASE_TABLET | Freq: Once | ORAL | Status: AC
  Administered 2024-10-06: 60 meq via ORAL
  Filled 2024-10-06: qty 3

## 2024-10-06 MED ORDER — SODIUM CHLORIDE 0.9% IV SOLUTION: Freq: Once | INTRAVENOUS | Status: DC

## 2024-10-06 MED ORDER — CARMEX CLASSIC LIP BALM EX OINT
TOPICAL_OINTMENT | CUTANEOUS | Status: DC | PRN
  Filled 2024-10-06: qty 10

## 2024-10-06 NOTE — Consult Note (Addendum)
 "  Referring Provider: Dr. Terry Gates Primary Care Physician:  Loretta Harlene BROCKS, MD Primary Gastroenterologist:  Dr. Rosario Gates   Reason for Consultation:  Anemia, dark stools   HPI: Loretta Gates is a 88 y.o. 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 S/P cholecystectomy 2018.   She presented to [facility] ED 10/05/2024 for further evaluation regarding generalized fatigue, nausea and dizziness. She also endorsed passing ongoing intermittent black stools in setting of taking Pepto-Bismol as needed for nausea.  Labs in the ED showed a WBC count of 10.2.  Hemoglobin 6.3 down from 9.6 two months ago.  Repeat Hemoglobin 6.1.  Hematocrit 21.9.  MCV 68.4.  Platelets 348.  BUN 24 up from 19.  Creatinine 1.32 up from 0.99.  Total bili 1.0.  Alk phos 51.  AST 27.  ALT 20.  Lipase 20.  Troponin 29. Rectal exam per the ED provider showed a small amount of very dark stool, guaiac positive.  Chest x-ray showed mild cardiomegaly without active lung disease.  CTAP showed diverticulosis in the descending and sigmoid colon without evidence of diverticulitis and irregularity of the liver margins, possibly reflecting cirrhosis.  She was transferred to [facility] for admission, blood transfusion and GI evaluation.  Repeat hemoglobin 6.0 at 8:30 PM on 12/28, transfused one unit of PRBCs. Post transfusion Hg 7.0. Transfused a second unit of PRBCs, infusion completed at 8am.  Iron  panel, and vitamin B12 levels ordered.  On Pantoprazole  40 mg IV twice daily.  No melena since admission.  She  is currently sitting at the bedside, her daughter Loretta Gates who is her power of attorney is present.  She endorses having chronic nausea for which she takes Pepto-Bismol early in the morning several days weekly for approximately the past 4 months.  She intermittently passes loose black stools which increased on 12/23.  Her nausea also worsened with new onset dizziness since 12/23.  No bright red blood per the rectum.  She takes Omeprazole  daily for history of GERD, denies having any recent acid reflux symptoms.  No dysphagia.  She has intermittent brief left lower abdominal pain over the past few days.  Last dose of Eliquis  and ASA were taken at 8:30 AM 12/28.  No alcohol use.  She underwent an EGD at [facility] 11/09/2020 which identified several fundic gland polyps.  Duodenal biopsies were negative for celiac disease.  A colonoscopy was done on the same date which identified moderate to severe left-sided diverticulosis and medium internal hemorrhoids.  ECHO 10/21/2022: Left ventricular ejection fraction, by estimation, is 55 to 60%. The left ventricle has normal function. The left ventricle has no regional wall motion abnormalities. Left ventricular diastolic function could not be evaluated. 1. Right ventricular systolic function is normal. The right ventricular size is mildly enlarged. There is moderately elevated pulmonary artery systolic pressure. 2. 3. Left atrial size was moderately dilated. 4. Right atrial size was mildly dilated. The mitral valve is normal in structure. Mild mitral valve regurgitation. No evidence of mitral stenosis. 5. 6. Tricuspid valve regurgitation is mild to moderate. The aortic valve is normal in structure. Aortic valve regurgitation is not visualized. No  aortic stenosis is present. 7. The inferior vena cava is dilated in size with <50% respiratory variability, suggesting right atrial pressure of 15 mmHg.    GI PROCEDURES:  EGD 11/09/2020: Findings  The esophageal mucosa  appeared normal.  Several sessile 2-78mm polyps were seen in the fundus suggestive of FGP.  Sample biopsies were obtained.  The first and second part of the duodenum appeared normal. Biopsies were  done from the duodenal bulb and second part to rule out Celiac disease   A. TISSUE LABELED DUODENAL BIOPSY, BIOPSIES: SMALL BOWEL MUCOSA SHOWING NO SIGNIFICANT HISTOPATHOLOGICAL CHANGES. THE FEATURES OF CELIAC SPRUE ARE NOT IDENTIFIED.  B. TISSUE LABELED GASTRIC POLYP BIOPSY, BIOPSY: BENIGN GASTRIC MUCOSA SHOWING CHANGES COMPATIBLE WITH BENIGN GASTRIC FUNDIC GLAND POLYP. NO ADENOMATOUS ATYPIA OR MALIGNANCY IDENTIFIED.   Colonoscopy 11/09/2020: Findings  Moderate to severe  left sided diverticulosis was noted.  Medium Internal hemorrhoids were seen on rectal retroflection.   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 patel;   2006  s/p left thyroidectomy w/ right inferior parathyoidectomy  (per path speciman marked parathyroid but only thyroid tissue)   Hypertension    IDA (iron  deficiency anemia)    hematology/  oncologist--- dr ennever/ sarah carter NP;  treated w/ IV iron  infusions   Multinodular thyroid    Neuropathy    mild hands/ feet, uses cane   Nocturia more than twice per night    Nocturnal leg cramps    OA (osteoarthritis)    knees   OAB (overactive bladder)    Osteoporosis    PAF (paroxysmal atrial fibrillation) [facility])    cardiologist--- dr pietro   PONV (postoperative nausea and vomiting)    Vitamin D  deficiency    Wears glasses     Past Surgical History:  Procedure Laterality Date   BREAST LUMPECTOMY WITH RADIOACTIVE SEED LOCALIZATION Right 10/25/2021   Procedure: RIGHT BREAST LUMPECTOMY WITH RADIOACTIVE SEED LOCALIZATION;  Surgeon: Vanderbilt Ned, MD;  Location: [facility];  Service: General;  Laterality: Right;   CARDIOVERSION N/A 01/01/2023   Procedure: CARDIOVERSION;  Surgeon: Pietro Redell RAMAN, MD;  Location: [facility] ENDOSCOPY;  Service: Cardiovascular;  Laterality: N/A;   CHOLECYSTECTOMY, LAPAROSCOPIC  02/25/2017   @[facility]    COLONOSCOPY WITH ESOPHAGOGASTRODUODENOSCOPY (EGD)  2022   IR ANGIO INTRA EXTRACRAN SEL COM CAROTID INNOMINATE UNI R MOD SED  02/07/2023   IR ANGIO VERTEBRAL SEL SUBCLAVIAN INNOMINATE UNI L MOD SED  02/07/2023   IR CT HEAD LTD  02/07/2023   IR INTRAVSC  STENT CERV CAROTID W/O EMB-PROT MOD SED INC ANGIO  02/07/2023   IR PERCUTANEOUS ART THROMBECTOMY/INFUSION INTRACRANIAL INC DIAG ANGIO  02/07/2023   IR RADIOLOGIST EVAL & MGMT  03/10/2023   MASTECTOMY WITH AXILLARY LYMPH NODE DISSECTION Left 03/05/2007   @ [facility]   OVARIAN CYST SURGERY     age 82;   abdominal   RADIOLOGY WITH ANESTHESIA N/A 02/06/2023   Procedure: IR WITH ANESTHESIA;  Surgeon: Radiologist, Medication, MD;  Location: [facility] OR;  Service: Radiology;  Laterality: N/A;   THYROIDECTOMY, PARTIAL Left 2006   left lobectomy and right infertior parathyroidectomy   TONSILLECTOMY     child    Prior to Admission medications  Medication Sig Start Date End Date Taking? Authorizing Provider   acetaminophen  (TYLENOL ) 325 MG tablet Take 2 tablets (650 mg total) by mouth every 4 (four) hours as needed for mild pain (or temp > 37.5 C (99.5 F)). 02/22/23   Angiulli, Toribio PARAS, PA-C  apixaban  (ELIQUIS ) 5 MG TABS tablet TAKE ONE TABLET BY MOUTH TWICE A DAY 01/22/24   Copland, Harlene BROCKS, MD  aspirin  EC 81 MG tablet Take 1 tablet (81 mg total) by mouth daily. Swallow whole. 09/01/24   Whitfield Harlene, NP  benzonatate  (TESSALON ) 100 MG capsule Take 1 capsule (100 mg total) by mouth 2 (two) times daily as needed for cough. 08/14/24   Almarie Waddell NOVAK, NP  cinacalcet  (SENSIPAR ) 30 MG tablet Take 1 tablet (30 mg total) by mouth daily with breakfast. 02/24/23   Setzer, Nena PARAS, PA-C  empagliflozin  (JARDIANCE ) 10 MG TABS tablet Take 1 tablet (10 mg total) by mouth daily. 01/30/24   Copland, Jessica C, MD  ergocalciferol  (VITAMIN D2) 1.25 MG (50000 UT) capsule Take 50,000 Units by mouth every 14 (fourteen) days. Every other saturday    [provider]  furosemide  (LASIX ) 20 MG tablet TAKE THREE TABLETS BY MOUTH ONE TIME DAILY , TAKE AN EXTRA TABLET AS NEEDED FOR SWELLING 04/09/24   Pietro Redell RAMAN, MD  guaiFENesin  (MUCINEX ) 600 MG 12 hr tablet Take 2 tablets (1,200 mg total) by mouth 2 (two) times daily. 08/14/24   Almarie Waddell NOVAK, NP  magnesium  oxide (MAG-OX) 400 (240 Mg) MG tablet TAKE ONE TABLET BY MOUTH ONE TIME DAILY 09/15/24   Copland, Jessica C, MD  metoprolol  tartrate (LOPRESSOR ) 50 MG tablet TAKE ONE AND ONE-HALF TABLETS BY MOUTH TWICE A DAY 01/30/24   Copland, Harlene BROCKS, MD  omeprazole  (PRILOSEC) 40 MG capsule TAKE ONE CAPSULE BY MOUTH TWICE A DAY BEFORE A MEAL 07/04/24   Copland, Harlene BROCKS, MD  oxybutynin  (DITROPAN -XL) 10 MG 24 hr tablet Take 1 tablet (10 mg total) by mouth in the morning and at bedtime. 07/30/24   Copland, Harlene BROCKS, MD  potassium chloride  SA (KLOR-CON  M) 20 MEQ tablet Take 1 tablet (20 mEq total) by mouth once for 1 dose. TAKE ONE TABLET BY MOUTH DAILY - USE WHEN TAKING MORE THAN  20 MG OF FUROSEMIDE  DAILY 01/30/24 08/20/24  Copland, Harlene BROCKS, MD    Current Facility-Administered Medications  Medication Dose Route Frequency Provider Last Rate Last Admin   0.9 %  sodium chloride  infusion (Manually program via Guardrails IV Fluids)   Intravenous Once Eldonna Elspeth PARAS, MD       0.9 %  sodium chloride  infusion (Manually program via Guardrails IV Fluids)   Intravenous Once Kathrin Simmer T, MD       morphine  (PF) 2 MG/ML injection 2 mg  2 mg Intravenous Q3H PRN  Eldonna Elspeth PARAS, MD       ondansetron  (ZOFRAN ) tablet 4 mg  4 mg Oral Q6H PRN Eldonna Elspeth PARAS, MD       Or   ondansetron  (ZOFRAN ) injection 4 mg  4 mg Intravenous Q6H PRN Newton, Steven J, MD       oxybutynin  (DITROPAN -XL) 24 hr tablet 10 mg  10 mg Oral QHS Hall, Carole N, DO   10 mg at 10/05/24 2225   pantoprazole  (PROTONIX ) injection 40 mg  40 mg Intravenous Q12H Desiderio Chew, PA-C   40 mg at 10/05/24 2226    Allergies as of 10/05/2024 - Review Complete 10/05/2024  Allergen Reaction Noted   Bee venom Swelling 10/19/2021   Levaquin [levofloxacin] Hives and Itching 10/19/2021   Peanut-containing drug products Other (See Comments) 10/20/2022    Family History  Problem Relation Age of Onset   Hypertension Mother    Hypertension Father    Hypertension Daughter    Hypertension Son    Sudden death Neg Hx     Social History   Socioeconomic History   Marital status: Married    Spouse name: Not on file   Number of children: 2   Years of education: Not on file   Highest education level: Bachelor's degree (e.g., BA, AB, BS)  Occupational History   Occupation: retired  Tobacco Use   Smoking status: Former    Current packs/day: 0.00    Types: Cigarettes    Quit date: 1969    Years since quitting: 57.0   Smokeless tobacco: Never  Vaping Use   Vaping status: Never Used  Substance and Sexual Activity   Alcohol use: Never   Drug use: Never   Sexual activity: Not Currently    Birth control/protection:  Post-menopausal  Other Topics Concern   Not on file  Social History Narrative   Decaf coffee only at a restaurant may have 1 cup of regular coffee    Social Drivers of Health   Tobacco Use: Medium Risk (10/05/2024)   Patient History    Smoking Tobacco Use: Former    Smokeless Tobacco Use: Never    Passive Exposure: Not on Actuary Strain: Low Risk (02/27/2023)   Overall Financial Resource Strain (CARDIA)    Difficulty of Paying Living Expenses: Not hard at all  Food Insecurity: No Food Insecurity (10/06/2024)   Epic    Worried About Radiation Protection Practitioner of Food in the Last Year: Never true    Ran Out of Food in the Last Year: Never true  Transportation Needs: No Transportation Needs (10/06/2024)   Epic    Lack of Transportation (Medical): No    Lack of Transportation (Non-Medical): No  Physical Activity: Sufficiently Active (08/20/2024)   Exercise Vital Sign    Days of Exercise per Week: 7 days    Minutes of Exercise per Session: 50 min  Stress: No Stress Concern Present (08/20/2024)   [employer] of Occupational Health - Occupational Stress Questionnaire    Feeling of Stress: Not at all  Social Connections: Socially Integrated (10/06/2024)   Social Connection and Isolation Panel    Frequency of Communication with Friends and Family: Three times a week    Frequency of Social Gatherings with Friends and Family: More than three times a week    Attends Religious Services: More than 4 times per year    Active Member of Clubs or Organizations: Yes    Attends Banker Meetings: 1 to 4 times per year  Marital Status: Married  Catering Manager Violence: Not At Risk (10/06/2024)   Epic    Fear of Current or Ex-Partner: No    Emotionally Abused: No    Physically Abused: No    Sexually Abused: No  Depression (PHQ2-9): Low Risk (08/20/2024)   Depression (PHQ2-9)    PHQ-2 Score: 0  Alcohol Screen: Low Risk (01/30/2023)   Alcohol Screen    Last Alcohol  Screening Score (AUDIT): 0  Housing: Low Risk (10/06/2024)   Epic    Unable to Pay for Housing in the Last Year: No    Number of Times Moved in the Last Year: 0    Homeless in the Last Year: No  Utilities: Not At Risk (10/06/2024)   Epic    Threatened with loss of utilities: No  Health Literacy: Not on file   Review of Systems: Gen: + Fatigue. Denies fever, sweats or chills. No weight loss.  CV: Denies chest pain, palpitations or edema. Resp: Denies cough, shortness of breath of hemoptysis.  GI: See HPI.   GU : Denies urinary burning, blood in urine, increased urinary frequency or incontinence. MS: Denies joint pain, muscles aches or weakness. Derm: Denies rash, itchiness, skin lesions or unhealing ulcers. Psych: Denies depression, anxiety, memory loss or confusion. Heme: Denies easy bruising, bleeding. Neuro: + Dizziness. Endo: Nondiabetic. + Hyperparathyroidism.  Physical Exam: Vital signs in last 24 hours: Temp:  [97.8 F (36.6 C)-98.9 F (37.2 C)] 98.2 F (36.8 C) (12/29 0604) Pulse Rate:  [58-107] 85 (12/29 0604) Resp:  [14-21] 18 (12/29 0604) BP: (111-148)/(61-87) 148/80 (12/29 0604) SpO2:  [95 %-100 %] 98 % (12/29 0604) Weight:  [72.8 kg] 72.8 kg (12/28 1855) Last BM Date :  (prior to admission) General:  Alert fatigued and anxious appearing 88 year old female sitting at the bedside. Head:  Normocephalic and atraumatic. Eyes:  No scleral icterus. Conjunctiva pink. Ears:  Normal auditory acuity. Nose:  No deformity, discharge or lesions. Mouth:  Dentition intact. No ulcers or lesions.  Neck:  Supple. No lymphadenopathy or thyromegaly.  Lungs: Breath sounds clear throughout. No wheezes, rhonchi or crackles.  Room air. Heart: Irregular rhythm, no murmurs. Abdomen: Obese abdomen, protuberant.  Mild tenderness to the LLQ without rebound or guarding.  Positive bowel sounds to all 4 quadrants.  No palpable mass. Rectal: Deferred.  Dark stool on the gloved exam finger  reported per the ED provider.  FOBT positive. Musculoskeletal:  Symmetrical without gross deformities.  Pulses:  Normal pulses noted. Extremities:  Without clubbing or edema. Neurologic:  Alert and  oriented x 4.  Speech is clear.  Moves all extremities. Skin:  Intact without significant lesions or rashes. Psych:  Alert and cooperative. Normal mood and affect.  Intake/Output from previous day: 12/28 0701 - 12/29 0700 In: 571.3 [P.O.:240; I.V.:3.3; Blood:328] Out: -  Intake/Output this shift: No intake/output data recorded.  Lab Results: Recent Labs    10/05/24 1027 10/05/24 1634 10/05/24 2030 10/06/24 0324  WBC 10.2  --   --   --   HGB 6.3* 6.1* 6.0* 7.0*  HCT 21.9* 20.9* 21.1* 22.9*  PLT 348  --   --   --    BMET Recent Labs    10/05/24 1027  NA 140  K 3.5  CL 99  CO2 28  GLUCOSE 111*  BUN 24*  CREATININE 1.32*  CALCIUM 10.3   LFT Recent Labs    10/05/24 1027  PROT 6.7  ALBUMIN 4.2  AST 27  ALT 20  ALKPHOS 51  BILITOT 1.0   PT/INR No results for input(s): LABPROT, INR in the last 72 hours. Hepatitis Panel No results for input(s): HEPBSAG, HCVAB, HEPAIGM, HEPBIGM in the last 72 hours.    Studies/Results: CT ABDOMEN PELVIS W CONTRAST Result Date: 10/05/2024 CLINICAL DATA:  Abdominal pain, acute, nonlocalized EXAM: CT ABDOMEN AND PELVIS WITH CONTRAST TECHNIQUE: Multidetector CT imaging of the abdomen and pelvis was performed using the standard protocol following bolus administration of intravenous contrast. RADIATION DOSE REDUCTION: This exam was performed according to the departmental dose-optimization program which includes automated exposure control, adjustment of the mA and/or kV according to patient size and/or use of iterative reconstruction technique. CONTRAST:  75mL OMNIPAQUE  IOHEXOL  300 MG/ML  SOLN COMPARISON:  CT abdomen/pelvis dated 04/25/2024. FINDINGS: Evaluation is slightly limited by motion degradation. Lower chest: No acute  abnormality.  Cardiomegaly. Hepatobiliary: Similar irregularity of the liver margins could reflect cirrhosis. No focal liver abnormality is seen. Status post cholecystectomy. No biliary dilatation. Pancreas: Unremarkable. No pancreatic ductal dilatation or surrounding inflammatory changes. Spleen: Normal in size without focal abnormality. Adrenals/Urinary Tract: Adrenal glands are unremarkable. No hydronephrosis or urolithiasis. 1.2 cm simple cyst at the inferior pole of the left Gates, for which no follow-up imaging is recommended. Bladder is unremarkable. Stomach/Bowel: Stomach is underdistended limiting detailed evaluation. No obstruction. Small bowel is grossly unremarkable. Appendix appears normal. Descending and sigmoid colonic diverticulosis without evidence of acute diverticulitis. Vascular/Lymphatic: Nonaneurysmal abdominal aorta with atherosclerotic calcification. No pathologically enlarged lymph nodes identified in the field of view. Similar stable prominent bilateral inguinal lymph nodes not definitively enlarged by size criteria. Reproductive: Uterus and bilateral adnexa are unremarkable. Other: Trace pelvic free fluid. No intraperitoneal free air. Similar small fat containing umbilical hernia. Musculoskeletal: No acute osseous abnormality. Multilevel degenerative changes of the thoracolumbar spine. Redemonstrated hemangioma in T10 vertebral body. IMPRESSION: 1. Trace pelvic free fluid of uncertain etiology. 2. Descending and sigmoid colonic diverticulosis without evidence of acute diverticulitis. 3. Similar irregularity of the liver margins could reflect cirrhosis. 4.  Aortic Atherosclerosis (ICD10-I70.0). Electronically Signed   By: Harrietta Sherry M.D.   On: 10/05/2024 14:06   DG Chest Portable 1 View Result Date: 10/05/2024 CLINICAL DATA:  Shortness of breath.  Disease.  Chest pain. EXAM: PORTABLE CHEST 1 VIEW COMPARISON:  08/14/2024 FINDINGS: Stable mild cardiomegaly. Both lungs are clear.  Surgical clips noted in left axilla and chest wall. IMPRESSION: Mild cardiomegaly. No active lung disease. Electronically Signed   By: Norleen DELENA Kil M.D.   On: 10/05/2024 11:39    IMPRESSION/PLAN:  88 year old female admitted with symptomatic acute on chronic anemia and dark stools.  Intermittent Pepto bismol use. Hg 6.3 down from 9.6 two months ago. FOBT positive. Transfused 2 units of PRBCs, post transfusion H/H ordered.  No melena as admission.  Last dose of Eliquis  was taken at 8:30 am on 12/28. Hemodynamically stable. - NPO - Await posttransfusion H&H - Transfuse for hemoglobin level less than 8 - Continue to hold Eliquis   - Continue Pantoprazole  40 mg IV twice daily - Ondansetron  4mg  po or IV Q 6 hrs PRN - EGD benefits and risks discussed with the patient and daughter Loretta Gates including risk with sedation, risk of bleeding, perforation and infection.  Patient and daughter provided consent for EGD.  Timing to be determined.  Await further recommendations per Dr. San.  GERD, on Omeprazole  40mg  bid at home.  EGD 11/2020 showed fundic gland polyps otherwise was unremarkable.  Paroxysmal atrial fibrillation.  Last dose of Eliquis  8:30  am on 12/28.  - Continue to hold Eliquis   CHF. LV EF 55 to 60% per ECHO 10/2022.  History of a large left MCA CVA s/p embolization to the left MCA s/p mechanical thrombectomy and revascularization of left MCA with rescue left ICA stenting  01/2023 (this occurred when patient was off anticoagulation due to elective surgery).  Eliquis  on hold in setting of GI bleed.  CTAP showed irregular liver margins, possibly suggestive of cirrhosis without evidence of splenomegaly or portal hypertension. Normal PLT count.   AKI - IV fluids per the hospitalist  - BMP in am   Elida CHRISTELLA Shawl  10/06/2024, 9:16 AM   Attending physician's note   I have taken a history, reviewed the chart, and examined the patient. I performed a substantive portion of this  encounter, including complete performance of at least one of the key components, in conjunction with the APP. I agree with the APP's note, impression, and recommendations with my edits.   88 year old female with medical history as outlined above, to include history of paroxysmal A-fib (on Eliquis ), CVA in 01/2023 (occurred while off anticoagulation for bladder surgery and treated with mechanical thrombectomy with revascularization of the left MCA and left ICA stenting), HTN, bladder cancer, CHFpEF, admitted with symptomatic anemia with nausea, dizziness, fatigue.  Admission evaluation notable for the following: - H/H 6.3/21.9, MCV/RDW 68/18 - BUN/creatinine 24/1.32, K3.3 - T. bili 1.5, otherwise normal liver enzymes and ALP - Ferritin 8, iron  67, TIBC 477, sat 14% - Normal B12, folate - FOBT positive - CT A/P: Diverticulosis without diverticulitis, irregular liver contours, otherwise normal GI tract  Transfused 2 units RBCs with posttransfusion H/H 8.3/27.  No bleeding since arrival to the ER  1) Symptomatic anemia 2) Iron  deficiency anemia 3) Chronic anticoagulation -Serial CBC checks with additional blood products as needed per protocol - Will likely benefit from IV iron  during this hospitalization - CLD today, n.p.o. at midnight - Plan for EGD tomorrow morning for diagnostic and therapeutic intent - Holding Eliquis .  Suspect delayed washout due to AKI started on high-dose IV PPI  4) Paroxysmal A-fib 5) History of CVA - Daughter very worried about holding Eliquis  as she had a CVA in 2024 when holding Eliquis  electively for bladder surgery.  Explained the rationale for holding anticoagulation for endoscopic procedures and with suspected active GI bleed and she and the patient understand and agree - Will try to resume anticoagulation as soon as possible after EGD tomorrow  6) AKI - Management per primary Hospital service  The indications, risks, and benefits of EGD were explained to  the patient in detail. Risks include but are not limited to bleeding, perforation, adverse reaction to medications, and cardiopulmonary compromise. Sequelae include but are not limited to the possibility of surgery, hospitalization, and mortality. The patient verbalized understanding and wished to proceed. All questions answered.   Charmain Diosdado, DO, FACG [phone] office   A total of 75 minutes of time was spent on this encounter, including in depth chart review, independent review of results as outlined above, communicating results with the patient directly, face-to-face time with the patient, coordinating care, and ordering studies and medications as appropriate, and documentation.           "

## 2024-10-06 NOTE — Anesthesia Preprocedure Evaluation (Signed)
 "                                  Anesthesia Evaluation  Patient identified by MRN, date of birth, ID band Patient awake    Reviewed: Allergy & Precautions, NPO status , Patient's Chart, lab work & pertinent test results  History of Anesthesia Complications Negative for: history of anesthetic complications  Airway Mallampati: II  TM Distance: >3 FB Neck ROM: Full    Dental no notable dental hx. (+) Teeth Intact, Dental Advisory Given,    Pulmonary former smoker   Pulmonary exam normal breath sounds clear to auscultation       Cardiovascular hypertension, Normal cardiovascular exam+ dysrhythmias Atrial Fibrillation  Rhythm:Regular Rate:Normal     Neuro/Psych CVA    GI/Hepatic   Endo/Other    Renal/GU    Hx of bladder CA    Musculoskeletal  (+) Arthritis ,    Abdominal   Peds  Hematology  (+) Blood dyscrasia, anemia Lab Results      Component                Value               Date                      WBC                      8.3                 10/06/2024                HGB                      9.0 (L)             10/06/2024                HCT                      30.0 (L)            10/06/2024                MCV                      73.9 (L)            10/06/2024                PLT                      305                 10/06/2024              Anesthesia Other Findings L breast CA  Reproductive/Obstetrics                              Anesthesia Physical Anesthesia Plan  ASA: 4  Anesthesia Plan: MAC   Post-op Pain Management: Minimal or no pain anticipated   Induction:   PONV Risk Score and Plan: Treatment may vary due to age or medical condition and Propofol  infusion  Airway Management Planned: Nasal Cannula and Natural Airway  Additional Equipment: None  Intra-op Plan:  Post-operative Plan:   Informed Consent: I have reviewed the patients History and Physical, chart, labs and discussed the  procedure including the risks, benefits and alternatives for the proposed anesthesia with the patient or authorized representative who has indicated his/her understanding and acceptance.     Dental advisory given  Plan Discussed with:   Anesthesia Plan Comments: (EGD for anemia)         Anesthesia Quick Evaluation  "

## 2024-10-06 NOTE — Progress Notes (Signed)
 " PROGRESS NOTE  Loretta Gates FMW:[id number] DOB: [birth year]   PCP: [name], MD  Patient is from: Home.  DOA: 10/05/2024 LOS: 0  Chief complaints Chief Complaint  Patient presents with   Nausea   Dizziness     Brief Narrative / Interim history: 88 year old F with PMH of left breast cancer s/p mastectomy,  HFpEF, A-fib on Eliquis , left MCA CVA, left ICA stenosis s/p stenting, GIB and IDA presented to ED with GI bleed, nausea, dizziness, fatigue, diarrhea and shortness of breath for about 4 to 5 days, and admitted with symptomatic acute blood loss anemia due to GI bleed.  In ED, stable vitals.  Hgb 6.3 (was 9.6 on 10/22 and 13.0 on 3/19).  Hemoccult positive.  CT abdomen and pelvis without acute finding but showed diverticulosis, trace pelvic free fluid and irregularity of the liver margins which could reflect cirrhosis.  Started on IV Protonix .  1 unit of blood ordered.  GI consulted, and patient was admitted for further care.  The next day, hemoglobin improved to 8.3 after 2 units.  GI on board.    Subjective: Seen and examined earlier this morning.  No major events overnight or this morning. Reports nausea which is chronic for her.  No bowel movement in 2 days.  Denies abdominal pain or UTI symptoms.  Last dose of Eliquis  the morning of 12/28.  Daughter at bedside concerned about holding Eliquis  since she had CVA when Eliquis  was stopped last time.   Assessment and plan: Acute on chronic blood loss anemia due to GI bleed: Hemoccult positive.  CT abdomen and pelvis with contrast without acute finding.  Patient is on Eliquis  and aspirin .  Reportedly taken off Brilinta  about 2 weeks ago.  No bowel movement in the last 2 days.  Last dose of Eliquis  the morning of 12/28.  Anemia panel with severe iron  deficiency Recent Labs    12/26/23 1124 07/30/24 1000 10/05/24 1027 10/05/24 1634 10/05/24 2030 10/06/24 0324 10/06/24 0856 10/06/24 1025  HGB 13.0 9.6* 6.3* 6.1* 6.0*  7.0* 8.3* 9.0*  - Transfused 2 units of blood with appropriate response. - Daughter is hesitant about IV iron  due to prior experience that led to hospitalization in 2024.  - Continue IV Protonix  - Continue holding Eliquis  and aspirin  - Follow GI recommendations.  Chronic HFpEF: No cardiopulmonary symptoms at the moment.  TTE in 10/2022 with LVEF of 55 to 60%, moderately elevated PASP, moderate LAE and mild to moderate TR.  Appears euvolemic on exam.  On Lasix  60 mg daily and Jardiance  10 mg daily at home. - Hold Jardiance  and Lasix  in the setting of AKI. - Strict intake and output, daily weight, renal functions and electrolytes  Persistent atrial fibrillation: Rate controlled. - Resume home metoprolol  at 75 mg twice daily - Continue holding Eliquis  in the setting of GI bleed - Optimize electrolytes  AKI: Baseline creatinine 0.8-0.9.  Likely due to poor p.o. intake in the setting of nausea, vomiting, abdominal pain Recent Labs    12/26/23 1124 07/30/24 1000 10/05/24 1027 10/06/24 0856  BUN 24 19 24* 20  CREATININE 0.89 0.99 1.32* 1.25*  - Continue holding diuretics.  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  -Continue holding Eliquis  and aspirin  in the setting  of GI bleed -PT/OT eval  Possible liver cirrhosis: CT abdomen and pelvis raises concern for liver cirrhosis. -Outpatient follow-up with GI   History of left breast cancer s/p mastectomy w/ node dissection's ; completed chemotherapy 2008   Hypokalemia -Monitor replenish K and Mg as appropriate   Overactive bladder - Continue home oxybutynin   Chronic constipation -MiraLAX  as needed.   Class I obesity Body mass index is 33.54 kg/m.           DVT  prophylaxis:  Place TED hose Start: 10/05/24 1912  Code Status: Full code Family Communication: Updated patient's daughter at bedside Level of care: Telemetry Status is: Observation The patient will require care spanning > 2 midnights and should be moved to inpatient because: Acute on chronic blood loss anemia due to GI bleed.    Final disposition: Likely home once medically stable   55 minutes with more than 50% spent in reviewing records, counseling patient/family and coordinating care.  Consultants:  Gastroenterology  Procedures: None  Microbiology summarized: COVID-19, influenza and RSV PCR nonreactive  Objective: Vitals:   10/06/24 0147 10/06/24 0537 10/06/24 0604 10/06/24 0804  BP: 137/87 135/75 (!) 148/80 (!) 141/74  Pulse: 80 67 85 83  Resp: 19 18 18 19   Temp: 98.2 F (36.8 C) 97.8 F (36.6 C) 98.2 F (36.8 C) 97.8 F (36.6 C)  TempSrc: Oral Oral Oral Oral  SpO2: 97% 96% 98% 95%  Weight:      Height:        Examination:  GENERAL: No apparent distress.  Nontoxic. HEENT: MMM.  Vision and hearing grossly intact.  NECK: Supple.  No apparent JVD.  RESP:  No IWOB.  Fair aeration bilaterally. CVS:  RRR. Heart sounds normal.  ABD/GI/GU: BS+. Abd soft, NTND.  MSK/EXT:  Moves extremities. No apparent deformity. No edema.  SKIN: no apparent skin lesion or wound NEURO: AA.  Oriented appropriately.  No apparent focal neuro deficit. PSYCH: Calm. Normal affect.   Sch Meds:  Scheduled Meds:  sodium chloride    Intravenous Once   sodium chloride    Intravenous Once   oxybutynin   10 mg Oral QHS   pantoprazole  (PROTONIX ) IV  40 mg Intravenous Q12H   Continuous Infusions: PRN Meds:.lip balm, morphine  injection, ondansetron  **OR** ondansetron  (ZOFRAN ) IV, mouth rinse  Antimicrobials: Anti-infectives (From admission, onward)    None        I have personally reviewed the following labs and images: CBC: Recent Labs  Lab 10/05/24 1027 10/05/24 1634  10/05/24 2030 10/06/24 0324 10/06/24 0856 10/06/24 1025  WBC 10.2  --   --   --  8.3  --   HGB 6.3* 6.1* 6.0* 7.0* 8.3* 9.0*  HCT 21.9* 20.9* 21.1* 22.9* 26.9* 30.0*  MCV 68.4*  --   --   --  73.9*  --   PLT 348  --   --   --  305  --    BMP &GFR Recent Labs  Lab 10/05/24 1027 10/06/24 0856  NA 140 145  K 3.5 3.3*  CL 99 104  CO2 28 28  GLUCOSE 111* 95  BUN 24* 20  CREATININE 1.32* 1.25*  CALCIUM 10.3 10.2   Estimated Creatinine Clearance: 27.4 mL/min (A) (by C-G formula based on SCr of 1.25 mg/dL (H)). Liver & Pancreas: Recent Labs  Lab 10/05/24 1027 10/06/24 0856  AST 27 26  ALT 20 19  ALKPHOS 51 56  BILITOT 1.0 1.5*  PROT 6.7 6.5  ALBUMIN 4.2 4.0   Recent  Labs  Lab 10/05/24 1027  LIPASE 20   No results for input(s): AMMONIA in the last 168 hours. Diabetic: No results for input(s): HGBA1C in the last 72 hours. No results for input(s): GLUCAP in the last 168 hours. Cardiac Enzymes: No results for input(s): CKTOTAL, CKMB, CKMBINDEX, TROPONINI in the last 168 hours. No results for input(s): PROBNP in the last 8760 hours. Coagulation Profile: No results for input(s): INR, PROTIME in the last 168 hours. Thyroid Function Tests: No results for input(s): TSH, T4TOTAL, FREET4, T3FREE, THYROIDAB in the last 72 hours. Lipid Profile: No results for input(s): CHOL, HDL, LDLCALC, TRIG, CHOLHDL, LDLDIRECT in the last 72 hours. Anemia Panel: Recent Labs    10/06/24 0856  VITAMINB12 1,177*  FOLATE 9.1  FERRITIN 8*  TIBC 477*  IRON  67  RETICCTPCT 1.3   Urine analysis:    Component Value Date/Time   COLORURINE STRAW (A) 10/05/2024 1006   APPEARANCEUR CLEAR 10/05/2024 1006   LABSPEC 1.015 10/05/2024 1006   PHURINE 7.0 10/05/2024 1006   GLUCOSEU 250 (A) 10/05/2024 1006   HGBUR NEGATIVE 10/05/2024 1006   BILIRUBINUR NEGATIVE 10/05/2024 1006   BILIRUBINUR negative 03/23/2023 0951   KETONESUR NEGATIVE 10/05/2024 1006    PROTEINUR NEGATIVE 10/05/2024 1006   UROBILINOGEN 0.2 03/23/2023 0951   NITRITE NEGATIVE 10/05/2024 1006   LEUKOCYTESUR TRACE (A) 10/05/2024 1006   Sepsis Labs: Invalid input(s): PROCALCITONIN, LACTICIDVEN  Microbiology: Recent Results (from the past 240 hours)  Resp panel by RT-PCR (RSV, Flu A&B, Covid) Anterior Nasal Swab     Status: None   Collection Time: 10/05/24 10:06 AM   Specimen: Anterior Nasal Swab  Result Value Ref Range Status   SARS Coronavirus 2 by RT PCR NEGATIVE NEGATIVE Final    Comment: (NOTE) SARS-CoV-2 target nucleic acids are NOT DETECTED.  The SARS-CoV-2 RNA is generally detectable in upper respiratory specimens during the acute phase of infection. The lowest concentration of SARS-CoV-2 viral copies this assay can detect is 138 copies/mL. A negative result does not preclude SARS-Cov-2 infection and should not be used as the sole basis for treatment or other patient management decisions. A negative result may occur with  improper specimen collection/handling, submission of specimen other than nasopharyngeal swab, presence of viral mutation(s) within the areas targeted by this assay, and inadequate number of viral copies(<138 copies/mL). A negative result must be combined with clinical observations, patient history, and epidemiological information. The expected result is Negative.  Fact Sheet for Patients:  bloggercourse.com  Fact Sheet for Healthcare Providers:  seriousbroker.it  This test is no t yet approved or cleared by the [place]  FDA and  has been authorized for detection and/or diagnosis of SARS-CoV-2 by FDA under an Emergency Use Authorization (EUA). This EUA will remain  in effect (meaning this test can be used) for the duration of the COVID-19 declaration under Section 564(b)(1) of the Act, 21 U.S.C.section 360bbb-3(b)(1), unless the authorization is terminated  or revoked sooner.        Influenza A by PCR NEGATIVE NEGATIVE Final   Influenza B by PCR NEGATIVE NEGATIVE Final    Comment: (NOTE) The Xpert Xpress SARS-CoV-2/FLU/RSV plus assay is intended as an aid in the diagnosis of influenza from Nasopharyngeal swab specimens and should not be used as a sole basis for treatment. Nasal washings and aspirates are unacceptable for Xpert Xpress SARS-CoV-2/FLU/RSV testing.  Fact Sheet for Patients: bloggercourse.com  Fact Sheet for Healthcare Providers: seriousbroker.it  This test is not yet approved or cleared by the [place] FDA and has been authorized for detection and/or diagnosis of SARS-CoV-2 by FDA under an Emergency Use Authorization (EUA). This EUA will remain in effect (meaning this test can be used) for the duration of the COVID-19 declaration under Section 564(b)(1) of the Act, 21 U.S.C. section 360bbb-3(b)(1), unless the authorization is terminated or revoked.     Resp Syncytial Virus by PCR NEGATIVE NEGATIVE Final    Comment: (NOTE) Fact Sheet for Patients: bloggercourse.com  Fact Sheet for Healthcare Providers: seriousbroker.it  This test is not yet approved or cleared by the [place]  FDA and has been authorized for detection and/or diagnosis of SARS-CoV-2 by FDA under an Emergency Use Authorization (EUA). This EUA will remain in effect (meaning this test can be used) for the duration of the COVID-19 declaration under Section 564(b)(1) of the Act, 21 U.S.C. section 360bbb-3(b)(1), unless the authorization is terminated or revoked.  Performed at [facility], [address] [place], [place] [address]     Radiology Studies: CT ABDOMEN PELVIS W CONTRAST Result Date: 10/05/2024 CLINICAL DATA:  Abdominal pain, acute, nonlocalized EXAM: CT ABDOMEN AND PELVIS WITH CONTRAST TECHNIQUE: Multidetector CT imaging of the abdomen  and pelvis was performed using the standard protocol following bolus administration of intravenous contrast. RADIATION DOSE REDUCTION: This exam was performed according to the departmental dose-optimization program which includes automated exposure control, adjustment of the mA and/or kV according to patient size and/or use of iterative reconstruction technique. CONTRAST:  75mL OMNIPAQUE  IOHEXOL  300 MG/ML  SOLN COMPARISON:  CT abdomen/pelvis dated 04/25/2024. FINDINGS: Evaluation is slightly limited by motion degradation. Lower chest: No acute abnormality.  Cardiomegaly. Hepatobiliary: Similar irregularity of the liver margins could reflect cirrhosis. No focal liver abnormality is seen. Status post cholecystectomy. No biliary dilatation. Pancreas: Unremarkable. No pancreatic ductal dilatation or surrounding inflammatory changes. Spleen: Normal in size without focal abnormality. Adrenals/Urinary Tract: Adrenal glands are unremarkable. No hydronephrosis or urolithiasis. 1.2 cm simple cyst at the inferior pole of the left kidney, for which no follow-up imaging is recommended. Bladder is unremarkable. Stomach/Bowel: Stomach is underdistended limiting detailed evaluation. No obstruction. Small bowel is grossly unremarkable. Appendix appears normal. Descending and sigmoid colonic diverticulosis without evidence of acute diverticulitis. Vascular/Lymphatic: Nonaneurysmal abdominal aorta with atherosclerotic calcification. No pathologically enlarged lymph nodes identified in the field of view. Similar stable prominent bilateral inguinal lymph nodes not definitively enlarged by size criteria. Reproductive: Uterus and bilateral adnexa are unremarkable. Other: Trace pelvic free fluid. No intraperitoneal free air. Similar small fat containing umbilical hernia. Musculoskeletal: No acute osseous abnormality. Multilevel degenerative changes of the thoracolumbar spine. Redemonstrated hemangioma in T10 vertebral body. IMPRESSION: 1.  Trace pelvic free fluid of uncertain etiology. 2. Descending and sigmoid colonic diverticulosis without evidence of acute diverticulitis. 3. Similar irregularity of the liver margins could reflect cirrhosis. 4.  Aortic Atherosclerosis (ICD10-I70.0). Electronically Signed   By: Harrietta Sherry M.D.   On: 10/05/2024 14:06      Markie Heffernan T. Karizma Cheek [facility]  If 7PM-7AM, please contact night-coverage [email or web address] 10/06/2024, 11:53 AM   "

## 2024-10-06 NOTE — H&P (View-Only) (Signed)
 "  Referring Provider: Dr. Terry Hurst Primary Care Physician:  [name], MD Primary Gastroenterologist:  Dr. Rosario Kidney   Reason for Consultation:  Anemia, dark stools   HPI: Loretta Gates is a 88 y.o. 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 S/P cholecystectomy 2018.   She presented to [facility] ED 10/05/2024 for further evaluation regarding generalized fatigue, nausea and dizziness. She also endorsed passing ongoing intermittent black stools in setting of taking Pepto-Bismol as needed for nausea.  Labs in the ED showed a WBC count of 10.2.  Hemoglobin 6.3 down from 9.6 two months ago.  Repeat Hemoglobin 6.1.  Hematocrit 21.9.  MCV 68.4.  Platelets 348.  BUN 24 up from 19.  Creatinine 1.32 up from 0.99.  Total bili 1.0.  Alk phos 51.  AST 27.  ALT 20.  Lipase 20.  Troponin 29. Rectal exam per the ED provider showed a small amount of very dark stool, guaiac positive.  Chest x-ray showed mild cardiomegaly without active lung disease.  CTAP showed diverticulosis in the descending and sigmoid colon without evidence of diverticulitis and irregularity of the liver margins, possibly reflecting cirrhosis.  She was transferred to [facility] for admission, blood transfusion and GI evaluation.  Repeat hemoglobin 6.0 at 8:30 PM on 12/28, transfused one unit of PRBCs. Post transfusion Hg 7.0. Transfused a second unit of PRBCs, infusion completed at 8am.  Iron  panel, and vitamin B12 levels ordered.  On Pantoprazole  40 mg IV twice daily.  No melena since admission.  She  is currently sitting at the bedside, her daughter Aleck who is her power of attorney is present.  She endorses having chronic nausea for which she takes Pepto-Bismol early in the morning several days weekly for approximately the past 4 months.  She intermittently passes loose black stools which increased on 12/23.  Her nausea also worsened with new onset dizziness since 12/23.  No bright red blood per the rectum.  She takes Omeprazole  daily for history of GERD, denies having any recent acid reflux symptoms.  No dysphagia.  She has intermittent brief left lower abdominal pain over the past few days.  Last dose of Eliquis  and ASA were taken at 8:30 AM 12/28.  No alcohol use.  She underwent an EGD at [facility] 11/09/2020 which identified several fundic gland polyps.  Duodenal biopsies were negative for celiac disease.  A colonoscopy was done on the same date which identified moderate to severe left-sided diverticulosis and medium internal hemorrhoids.  ECHO 10/21/2022: Left ventricular ejection fraction, by estimation, is 55 to 60%. The left ventricle has normal function. The left ventricle has no regional wall motion abnormalities. Left ventricular diastolic function could not be evaluated. 1. Right ventricular systolic function is normal. The right ventricular size is mildly enlarged. There is moderately elevated pulmonary artery systolic pressure. 2. 3. Left atrial size was moderately dilated. 4. Right atrial size was mildly dilated. The mitral valve is normal in structure. Mild mitral valve regurgitation. No evidence of mitral stenosis. 5. 6. Tricuspid valve regurgitation is mild to moderate. The aortic valve is normal in structure. Aortic valve regurgitation is not visualized. No  aortic stenosis is present. 7. The inferior vena cava is dilated in size with <50% respiratory variability, suggesting right atrial pressure of 15 mmHg.    GI PROCEDURES:  EGD 11/09/2020: Findings  The esophageal mucosa  appeared normal.  Several sessile 2-78mm polyps were seen in the fundus suggestive of FGP.  Sample biopsies were obtained.  The first and second part of the duodenum appeared normal. Biopsies were  done from the duodenal bulb and second part to rule out Celiac disease   A. TISSUE LABELED DUODENAL BIOPSY, BIOPSIES: SMALL BOWEL MUCOSA SHOWING NO SIGNIFICANT HISTOPATHOLOGICAL CHANGES. THE FEATURES OF CELIAC SPRUE ARE NOT IDENTIFIED.  B. TISSUE LABELED GASTRIC POLYP BIOPSY, BIOPSY: BENIGN GASTRIC MUCOSA SHOWING CHANGES COMPATIBLE WITH BENIGN GASTRIC FUNDIC GLAND POLYP. NO ADENOMATOUS ATYPIA OR MALIGNANCY IDENTIFIED.   Colonoscopy 11/09/2020: Findings  Moderate to severe  left sided diverticulosis was noted.  Medium Internal hemorrhoids were seen on rectal retroflection.   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 patel;   2006  s/p left thyroidectomy w/ right inferior parathyoidectomy  (per path speciman marked parathyroid but only thyroid tissue)   Hypertension    IDA (iron  deficiency anemia)    hematology/  oncologist--- dr ennever/ sarah carter NP;  treated w/ IV iron  infusions   Multinodular thyroid    Neuropathy    mild hands/ feet, uses cane   Nocturia more than twice per night    Nocturnal leg cramps    OA (osteoarthritis)    knees   OAB (overactive bladder)    Osteoporosis    PAF (paroxysmal atrial fibrillation) [facility])    cardiologist--- dr pietro   PONV (postoperative nausea and vomiting)    Vitamin D  deficiency    Wears glasses     Past Surgical History:  Procedure Laterality Date   BREAST LUMPECTOMY WITH RADIOACTIVE SEED LOCALIZATION Right 10/25/2021   Procedure: RIGHT BREAST LUMPECTOMY WITH RADIOACTIVE SEED LOCALIZATION;  Surgeon: Vanderbilt Ned, MD;  Location: [facility];  Service: General;  Laterality: Right;   CARDIOVERSION N/A 01/01/2023   Procedure: CARDIOVERSION;  Surgeon: Pietro Redell RAMAN, MD;  Location: [facility] ENDOSCOPY;  Service: Cardiovascular;  Laterality: N/A;   CHOLECYSTECTOMY, LAPAROSCOPIC  02/25/2017   @[facility]    COLONOSCOPY WITH ESOPHAGOGASTRODUODENOSCOPY (EGD)  2022   IR ANGIO INTRA EXTRACRAN SEL COM CAROTID INNOMINATE UNI R MOD SED  02/07/2023   IR ANGIO VERTEBRAL SEL SUBCLAVIAN INNOMINATE UNI L MOD SED  02/07/2023   IR CT HEAD LTD  02/07/2023   IR INTRAVSC  STENT CERV CAROTID W/O EMB-PROT MOD SED INC ANGIO  02/07/2023   IR PERCUTANEOUS ART THROMBECTOMY/INFUSION INTRACRANIAL INC DIAG ANGIO  02/07/2023   IR RADIOLOGIST EVAL & MGMT  03/10/2023   MASTECTOMY WITH AXILLARY LYMPH NODE DISSECTION Left 03/05/2007   @ [facility]   OVARIAN CYST SURGERY     age 82;   abdominal   RADIOLOGY WITH ANESTHESIA N/A 02/06/2023   Procedure: IR WITH ANESTHESIA;  Surgeon: Radiologist, Medication, MD;  Location: [facility] OR;  Service: Radiology;  Laterality: N/A;   THYROIDECTOMY, PARTIAL Left 2006   left lobectomy and right infertior parathyroidectomy   TONSILLECTOMY     child    Prior to Admission medications  Medication Sig Start Date End Date Taking? Authorizing Provider   acetaminophen  (TYLENOL ) 325 MG tablet Take 2 tablets (650 mg total) by mouth every 4 (four) hours as needed for mild pain (or temp > 37.5 C (99.5 F)). 02/22/23   Angiulli, Toribio PARAS, PA-C  apixaban  (ELIQUIS ) 5 MG TABS tablet TAKE ONE TABLET BY MOUTH TWICE A DAY 01/22/24   Copland, Harlene BROCKS, MD  aspirin  EC 81 MG tablet Take 1 tablet (81 mg total) by mouth daily. Swallow whole. 09/01/24   Whitfield Harlene, NP  benzonatate  (TESSALON ) 100 MG capsule Take 1 capsule (100 mg total) by mouth 2 (two) times daily as needed for cough. 08/14/24   Almarie Waddell NOVAK, NP  cinacalcet  (SENSIPAR ) 30 MG tablet Take 1 tablet (30 mg total) by mouth daily with breakfast. 02/24/23   Setzer, Nena PARAS, PA-C  empagliflozin  (JARDIANCE ) 10 MG TABS tablet Take 1 tablet (10 mg total) by mouth daily. 01/30/24   Copland, Jessica C, MD  ergocalciferol  (VITAMIN D2) 1.25 MG (50000 UT) capsule Take 50,000 Units by mouth every 14 (fourteen) days. Every other saturday    [provider]  furosemide  (LASIX ) 20 MG tablet TAKE THREE TABLETS BY MOUTH ONE TIME DAILY , TAKE AN EXTRA TABLET AS NEEDED FOR SWELLING 04/09/24   Pietro Redell RAMAN, MD  guaiFENesin  (MUCINEX ) 600 MG 12 hr tablet Take 2 tablets (1,200 mg total) by mouth 2 (two) times daily. 08/14/24   Almarie Waddell NOVAK, NP  magnesium  oxide (MAG-OX) 400 (240 Mg) MG tablet TAKE ONE TABLET BY MOUTH ONE TIME DAILY 09/15/24   Copland, Jessica C, MD  metoprolol  tartrate (LOPRESSOR ) 50 MG tablet TAKE ONE AND ONE-HALF TABLETS BY MOUTH TWICE A DAY 01/30/24   Copland, Harlene BROCKS, MD  omeprazole  (PRILOSEC) 40 MG capsule TAKE ONE CAPSULE BY MOUTH TWICE A DAY BEFORE A MEAL 07/04/24   Copland, Harlene BROCKS, MD  oxybutynin  (DITROPAN -XL) 10 MG 24 hr tablet Take 1 tablet (10 mg total) by mouth in the morning and at bedtime. 07/30/24   Copland, Harlene BROCKS, MD  potassium chloride  SA (KLOR-CON  M) 20 MEQ tablet Take 1 tablet (20 mEq total) by mouth once for 1 dose. TAKE ONE TABLET BY MOUTH DAILY - USE WHEN TAKING MORE THAN  20 MG OF FUROSEMIDE  DAILY 01/30/24 08/20/24  Copland, Harlene BROCKS, MD    Current Facility-Administered Medications  Medication Dose Route Frequency Provider Last Rate Last Admin   0.9 %  sodium chloride  infusion (Manually program via Guardrails IV Fluids)   Intravenous Once Eldonna Elspeth PARAS, MD       0.9 %  sodium chloride  infusion (Manually program via Guardrails IV Fluids)   Intravenous Once Kathrin Simmer T, MD       morphine  (PF) 2 MG/ML injection 2 mg  2 mg Intravenous Q3H PRN  Eldonna Elspeth PARAS, MD       ondansetron  (ZOFRAN ) tablet 4 mg  4 mg Oral Q6H PRN Eldonna Elspeth PARAS, MD       Or   ondansetron  (ZOFRAN ) injection 4 mg  4 mg Intravenous Q6H PRN Newton, Steven J, MD       oxybutynin  (DITROPAN -XL) 24 hr tablet 10 mg  10 mg Oral QHS Hall, Carole N, DO   10 mg at 10/05/24 2225   pantoprazole  (PROTONIX ) injection 40 mg  40 mg Intravenous Q12H Desiderio Chew, PA-C   40 mg at 10/05/24 2226    Allergies as of 10/05/2024 - Review Complete 10/05/2024  Allergen Reaction Noted   Bee venom Swelling 10/19/2021   Levaquin [levofloxacin] Hives and Itching 10/19/2021   Peanut-containing drug products Other (See Comments) 10/20/2022    Family History  Problem Relation Age of Onset   Hypertension Mother    Hypertension Father    Hypertension Daughter    Hypertension Son    Sudden death Neg Hx     Social History   Socioeconomic History   Marital status: Married    Spouse name: Not on file   Number of children: 2   Years of education: Not on file   Highest education level: Bachelor's degree (e.g., BA, AB, BS)  Occupational History   Occupation: retired  Tobacco Use   Smoking status: Former    Current packs/day: 0.00    Types: Cigarettes    Quit date: 1969    Years since quitting: 57.0   Smokeless tobacco: Never  Vaping Use   Vaping status: Never Used  Substance and Sexual Activity   Alcohol use: Never   Drug use: Never   Sexual activity: Not Currently    Birth control/protection:  Post-menopausal  Other Topics Concern   Not on file  Social History Narrative   Decaf coffee only at a restaurant may have 1 cup of regular coffee    Social Drivers of Health   Tobacco Use: Medium Risk (10/05/2024)   Patient History    Smoking Tobacco Use: Former    Smokeless Tobacco Use: Never    Passive Exposure: Not on Actuary Strain: Low Risk (02/27/2023)   Overall Financial Resource Strain (CARDIA)    Difficulty of Paying Living Expenses: Not hard at all  Food Insecurity: No Food Insecurity (10/06/2024)   Epic    Worried About Radiation Protection Practitioner of Food in the Last Year: Never true    Ran Out of Food in the Last Year: Never true  Transportation Needs: No Transportation Needs (10/06/2024)   Epic    Lack of Transportation (Medical): No    Lack of Transportation (Non-Medical): No  Physical Activity: Sufficiently Active (08/20/2024)   Exercise Vital Sign    Days of Exercise per Week: 7 days    Minutes of Exercise per Session: 50 min  Stress: No Stress Concern Present (08/20/2024)   [employer] of Occupational Health - Occupational Stress Questionnaire    Feeling of Stress: Not at all  Social Connections: Socially Integrated (10/06/2024)   Social Connection and Isolation Panel    Frequency of Communication with Friends and Family: Three times a week    Frequency of Social Gatherings with Friends and Family: More than three times a week    Attends Religious Services: More than 4 times per year    Active Member of Clubs or Organizations: Yes    Attends Banker Meetings: 1 to 4 times per year  Marital Status: Married  Catering Manager Violence: Not At Risk (10/06/2024)   Epic    Fear of Current or Ex-Partner: No    Emotionally Abused: No    Physically Abused: No    Sexually Abused: No  Depression (PHQ2-9): Low Risk (08/20/2024)   Depression (PHQ2-9)    PHQ-2 Score: 0  Alcohol Screen: Low Risk (01/30/2023)   Alcohol Screen    Last Alcohol  Screening Score (AUDIT): 0  Housing: Low Risk (10/06/2024)   Epic    Unable to Pay for Housing in the Last Year: No    Number of Times Moved in the Last Year: 0    Homeless in the Last Year: No  Utilities: Not At Risk (10/06/2024)   Epic    Threatened with loss of utilities: No  Health Literacy: Not on file   Review of Systems: Gen: + Fatigue. Denies fever, sweats or chills. No weight loss.  CV: Denies chest pain, palpitations or edema. Resp: Denies cough, shortness of breath of hemoptysis.  GI: See HPI.   GU : Denies urinary burning, blood in urine, increased urinary frequency or incontinence. MS: Denies joint pain, muscles aches or weakness. Derm: Denies rash, itchiness, skin lesions or unhealing ulcers. Psych: Denies depression, anxiety, memory loss or confusion. Heme: Denies easy bruising, bleeding. Neuro: + Dizziness. Endo: Nondiabetic. + Hyperparathyroidism.  Physical Exam: Vital signs in last 24 hours: Temp:  [97.8 F (36.6 C)-98.9 F (37.2 C)] 98.2 F (36.8 C) (12/29 0604) Pulse Rate:  [58-107] 85 (12/29 0604) Resp:  [14-21] 18 (12/29 0604) BP: (111-148)/(61-87) 148/80 (12/29 0604) SpO2:  [95 %-100 %] 98 % (12/29 0604) Weight:  [72.8 kg] 72.8 kg (12/28 1855) Last BM Date :  (prior to admission) General:  Alert fatigued and anxious appearing 88 year old female sitting at the bedside. Head:  Normocephalic and atraumatic. Eyes:  No scleral icterus. Conjunctiva pink. Ears:  Normal auditory acuity. Nose:  No deformity, discharge or lesions. Mouth:  Dentition intact. No ulcers or lesions.  Neck:  Supple. No lymphadenopathy or thyromegaly.  Lungs: Breath sounds clear throughout. No wheezes, rhonchi or crackles.  Room air. Heart: Irregular rhythm, no murmurs. Abdomen: Obese abdomen, protuberant.  Mild tenderness to the LLQ without rebound or guarding.  Positive bowel sounds to all 4 quadrants.  No palpable mass. Rectal: Deferred.  Dark stool on the gloved exam finger  reported per the ED provider.  FOBT positive. Musculoskeletal:  Symmetrical without gross deformities.  Pulses:  Normal pulses noted. Extremities:  Without clubbing or edema. Neurologic:  Alert and  oriented x 4.  Speech is clear.  Moves all extremities. Skin:  Intact without significant lesions or rashes. Psych:  Alert and cooperative. Normal mood and affect.  Intake/Output from previous day: 12/28 0701 - 12/29 0700 In: 571.3 [P.O.:240; I.V.:3.3; Blood:328] Out: -  Intake/Output this shift: No intake/output data recorded.  Lab Results: Recent Labs    10/05/24 1027 10/05/24 1634 10/05/24 2030 10/06/24 0324  WBC 10.2  --   --   --   HGB 6.3* 6.1* 6.0* 7.0*  HCT 21.9* 20.9* 21.1* 22.9*  PLT 348  --   --   --    BMET Recent Labs    10/05/24 1027  NA 140  K 3.5  CL 99  CO2 28  GLUCOSE 111*  BUN 24*  CREATININE 1.32*  CALCIUM 10.3   LFT Recent Labs    10/05/24 1027  PROT 6.7  ALBUMIN 4.2  AST 27  ALT 20  ALKPHOS 51  BILITOT 1.0   PT/INR No results for input(s): LABPROT, INR in the last 72 hours. Hepatitis Panel No results for input(s): HEPBSAG, HCVAB, HEPAIGM, HEPBIGM in the last 72 hours.    Studies/Results: CT ABDOMEN PELVIS W CONTRAST Result Date: 10/05/2024 CLINICAL DATA:  Abdominal pain, acute, nonlocalized EXAM: CT ABDOMEN AND PELVIS WITH CONTRAST TECHNIQUE: Multidetector CT imaging of the abdomen and pelvis was performed using the standard protocol following bolus administration of intravenous contrast. RADIATION DOSE REDUCTION: This exam was performed according to the departmental dose-optimization program which includes automated exposure control, adjustment of the mA and/or kV according to patient size and/or use of iterative reconstruction technique. CONTRAST:  75mL OMNIPAQUE  IOHEXOL  300 MG/ML  SOLN COMPARISON:  CT abdomen/pelvis dated 04/25/2024. FINDINGS: Evaluation is slightly limited by motion degradation. Lower chest: No acute  abnormality.  Cardiomegaly. Hepatobiliary: Similar irregularity of the liver margins could reflect cirrhosis. No focal liver abnormality is seen. Status post cholecystectomy. No biliary dilatation. Pancreas: Unremarkable. No pancreatic ductal dilatation or surrounding inflammatory changes. Spleen: Normal in size without focal abnormality. Adrenals/Urinary Tract: Adrenal glands are unremarkable. No hydronephrosis or urolithiasis. 1.2 cm simple cyst at the inferior pole of the left kidney, for which no follow-up imaging is recommended. Bladder is unremarkable. Stomach/Bowel: Stomach is underdistended limiting detailed evaluation. No obstruction. Small bowel is grossly unremarkable. Appendix appears normal. Descending and sigmoid colonic diverticulosis without evidence of acute diverticulitis. Vascular/Lymphatic: Nonaneurysmal abdominal aorta with atherosclerotic calcification. No pathologically enlarged lymph nodes identified in the field of view. Similar stable prominent bilateral inguinal lymph nodes not definitively enlarged by size criteria. Reproductive: Uterus and bilateral adnexa are unremarkable. Other: Trace pelvic free fluid. No intraperitoneal free air. Similar small fat containing umbilical hernia. Musculoskeletal: No acute osseous abnormality. Multilevel degenerative changes of the thoracolumbar spine. Redemonstrated hemangioma in T10 vertebral body. IMPRESSION: 1. Trace pelvic free fluid of uncertain etiology. 2. Descending and sigmoid colonic diverticulosis without evidence of acute diverticulitis. 3. Similar irregularity of the liver margins could reflect cirrhosis. 4.  Aortic Atherosclerosis (ICD10-I70.0). Electronically Signed   By: Harrietta Sherry M.D.   On: 10/05/2024 14:06   DG Chest Portable 1 View Result Date: 10/05/2024 CLINICAL DATA:  Shortness of breath.  Disease.  Chest pain. EXAM: PORTABLE CHEST 1 VIEW COMPARISON:  08/14/2024 FINDINGS: Stable mild cardiomegaly. Both lungs are clear.  Surgical clips noted in left axilla and chest wall. IMPRESSION: Mild cardiomegaly. No active lung disease. Electronically Signed   By: Norleen DELENA Kil M.D.   On: 10/05/2024 11:39    IMPRESSION/PLAN:  88 year old female admitted with symptomatic acute on chronic anemia and dark stools.  Intermittent Pepto bismol use. Hg 6.3 down from 9.6 two months ago. FOBT positive. Transfused 2 units of PRBCs, post transfusion H/H ordered.  No melena as admission.  Last dose of Eliquis  was taken at 8:30 am on 12/28. Hemodynamically stable. - NPO - Await posttransfusion H&H - Transfuse for hemoglobin level less than 8 - Continue to hold Eliquis   - Continue Pantoprazole  40 mg IV twice daily - Ondansetron  4mg  po or IV Q 6 hrs PRN - EGD benefits and risks discussed with the patient and daughter [name]/POA including risk with sedation, risk of bleeding, perforation and infection.  Patient and daughter provided consent for EGD.  Timing to be determined.  Await further recommendations per Dr. San.  GERD, on Omeprazole  40mg  bid at home.  EGD 11/2020 showed fundic gland polyps otherwise was unremarkable.  Paroxysmal atrial fibrillation.  Last dose of Eliquis  8:30  am on 12/28.  - Continue to hold Eliquis   CHF. LV EF 55 to 60% per ECHO 10/2022.  History of a large left MCA CVA s/p embolization to the left MCA s/p mechanical thrombectomy and revascularization of left MCA with rescue left ICA stenting  01/2023 (this occurred when patient was off anticoagulation due to elective surgery).  Eliquis  on hold in setting of GI bleed.  CTAP showed irregular liver margins, possibly suggestive of cirrhosis without evidence of splenomegaly or portal hypertension. Normal PLT count.   AKI - IV fluids per the hospitalist  - BMP in am   Elida CHRISTELLA Shawl  10/06/2024, 9:16 AM   Attending physician's note   I have taken a history, reviewed the chart, and examined the patient. I performed a substantive portion of this  encounter, including complete performance of at least one of the key components, in conjunction with the APP. I agree with the APP's note, impression, and recommendations with my edits.   88 year old female with medical history as outlined above, to include history of paroxysmal A-fib (on Eliquis ), CVA in 01/2023 (occurred while off anticoagulation for bladder surgery and treated with mechanical thrombectomy with revascularization of the left MCA and left ICA stenting), HTN, bladder cancer, CHFpEF, admitted with symptomatic anemia with nausea, dizziness, fatigue.  Admission evaluation notable for the following: - H/H 6.3/21.9, MCV/RDW 68/18 - BUN/creatinine 24/1.32, K3.3 - T. bili 1.5, otherwise normal liver enzymes and ALP - Ferritin 8, iron  67, TIBC 477, sat 14% - Normal B12, folate - FOBT positive - CT A/P: Diverticulosis without diverticulitis, irregular liver contours, otherwise normal GI tract  Transfused 2 units RBCs with posttransfusion H/H 8.3/27.  No bleeding since arrival to the ER  1) Symptomatic anemia 2) Iron  deficiency anemia 3) Chronic anticoagulation -Serial CBC checks with additional blood products as needed per protocol - Will likely benefit from IV iron  during this hospitalization - CLD today, n.p.o. at midnight - Plan for EGD tomorrow morning for diagnostic and therapeutic intent - Holding Eliquis .  Suspect delayed washout due to AKI started on high-dose IV PPI  4) Paroxysmal A-fib 5) History of CVA - Daughter very worried about holding Eliquis  as she had a CVA in 2024 when holding Eliquis  electively for bladder surgery.  Explained the rationale for holding anticoagulation for endoscopic procedures and with suspected active GI bleed and she and the patient understand and agree - Will try to resume anticoagulation as soon as possible after EGD tomorrow  6) AKI - Management per primary Hospital service  The indications, risks, and benefits of EGD were explained to  the patient in detail. Risks include but are not limited to bleeding, perforation, adverse reaction to medications, and cardiopulmonary compromise. Sequelae include but are not limited to the possibility of surgery, hospitalization, and mortality. The patient verbalized understanding and wished to proceed. All questions answered.   Charmain Diosdado, DO, FACG [phone] office   A total of 75 minutes of time was spent on this encounter, including in depth chart review, independent review of results as outlined above, communicating results with the patient directly, face-to-face time with the patient, coordinating care, and ordering studies and medications as appropriate, and documentation.           "

## 2024-10-06 NOTE — Assessment & Plan Note (Signed)
 Cr 1.32 on presentation w/ GFR in 30s  Suspect prerenal etiology w/ poor perfusion  Gentle hydration in setting of chronic HFpEF and pending pRBC  Trend renal function w/ treatment  Monitor

## 2024-10-07 ENCOUNTER — Encounter (HOSPITAL_COMMUNITY): Payer: Self-pay | Admitting: Family Medicine

## 2024-10-07 ENCOUNTER — Telehealth (HOSPITAL_COMMUNITY): Payer: Self-pay | Admitting: Pharmacist

## 2024-10-07 ENCOUNTER — Encounter (HOSPITAL_COMMUNITY): Admission: EM | Disposition: A | Payer: Self-pay | Source: Home / Self Care | Attending: Emergency Medicine

## 2024-10-07 ENCOUNTER — Inpatient Hospital Stay (HOSPITAL_COMMUNITY): Payer: Self-pay | Admitting: Anesthesiology

## 2024-10-07 ENCOUNTER — Encounter (HOSPITAL_COMMUNITY): Payer: Self-pay | Admitting: Anesthesiology

## 2024-10-07 DIAGNOSIS — K5909 Other constipation: Secondary | ICD-10-CM | POA: Diagnosis present

## 2024-10-07 DIAGNOSIS — I480 Paroxysmal atrial fibrillation: Secondary | ICD-10-CM | POA: Diagnosis not present

## 2024-10-07 DIAGNOSIS — K2990 Gastroduodenitis, unspecified, without bleeding: Secondary | ICD-10-CM | POA: Diagnosis not present

## 2024-10-07 DIAGNOSIS — F32A Depression, unspecified: Secondary | ICD-10-CM | POA: Diagnosis present

## 2024-10-07 DIAGNOSIS — K317 Polyp of stomach and duodenum: Secondary | ICD-10-CM | POA: Diagnosis not present

## 2024-10-07 DIAGNOSIS — E890 Postprocedural hypothyroidism: Secondary | ICD-10-CM | POA: Diagnosis present

## 2024-10-07 DIAGNOSIS — Z7984 Long term (current) use of oral hypoglycemic drugs: Secondary | ICD-10-CM | POA: Diagnosis not present

## 2024-10-07 DIAGNOSIS — I69351 Hemiplegia and hemiparesis following cerebral infarction affecting right dominant side: Secondary | ICD-10-CM | POA: Diagnosis not present

## 2024-10-07 DIAGNOSIS — K2991 Gastroduodenitis, unspecified, with bleeding: Secondary | ICD-10-CM | POA: Diagnosis present

## 2024-10-07 DIAGNOSIS — Z87891 Personal history of nicotine dependence: Secondary | ICD-10-CM | POA: Diagnosis not present

## 2024-10-07 DIAGNOSIS — Z853 Personal history of malignant neoplasm of breast: Secondary | ICD-10-CM | POA: Diagnosis not present

## 2024-10-07 DIAGNOSIS — I482 Chronic atrial fibrillation, unspecified: Secondary | ICD-10-CM | POA: Diagnosis not present

## 2024-10-07 DIAGNOSIS — K31819 Angiodysplasia of stomach and duodenum without bleeding: Secondary | ICD-10-CM | POA: Diagnosis not present

## 2024-10-07 DIAGNOSIS — K922 Gastrointestinal hemorrhage, unspecified: Secondary | ICD-10-CM | POA: Diagnosis present

## 2024-10-07 DIAGNOSIS — I110 Hypertensive heart disease with heart failure: Secondary | ICD-10-CM | POA: Diagnosis present

## 2024-10-07 DIAGNOSIS — K2950 Unspecified chronic gastritis without bleeding: Secondary | ICD-10-CM | POA: Diagnosis not present

## 2024-10-07 DIAGNOSIS — E876 Hypokalemia: Secondary | ICD-10-CM | POA: Diagnosis present

## 2024-10-07 DIAGNOSIS — Z7982 Long term (current) use of aspirin: Secondary | ICD-10-CM | POA: Diagnosis not present

## 2024-10-07 DIAGNOSIS — D131 Benign neoplasm of stomach: Secondary | ICD-10-CM

## 2024-10-07 DIAGNOSIS — I63512 Cerebral infarction due to unspecified occlusion or stenosis of left middle cerebral artery: Secondary | ICD-10-CM | POA: Diagnosis not present

## 2024-10-07 DIAGNOSIS — H9193 Unspecified hearing loss, bilateral: Secondary | ICD-10-CM | POA: Diagnosis present

## 2024-10-07 DIAGNOSIS — I5032 Chronic diastolic (congestive) heart failure: Secondary | ICD-10-CM | POA: Diagnosis present

## 2024-10-07 DIAGNOSIS — K31811 Angiodysplasia of stomach and duodenum with bleeding: Secondary | ICD-10-CM | POA: Diagnosis present

## 2024-10-07 DIAGNOSIS — I10 Essential (primary) hypertension: Secondary | ICD-10-CM | POA: Diagnosis not present

## 2024-10-07 DIAGNOSIS — D509 Iron deficiency anemia, unspecified: Secondary | ICD-10-CM | POA: Diagnosis present

## 2024-10-07 DIAGNOSIS — I4819 Other persistent atrial fibrillation: Secondary | ICD-10-CM | POA: Diagnosis present

## 2024-10-07 DIAGNOSIS — K219 Gastro-esophageal reflux disease without esophagitis: Secondary | ICD-10-CM | POA: Diagnosis present

## 2024-10-07 DIAGNOSIS — R531 Weakness: Secondary | ICD-10-CM

## 2024-10-07 DIAGNOSIS — K760 Fatty (change of) liver, not elsewhere classified: Secondary | ICD-10-CM | POA: Diagnosis present

## 2024-10-07 DIAGNOSIS — Z7901 Long term (current) use of anticoagulants: Secondary | ICD-10-CM | POA: Diagnosis not present

## 2024-10-07 DIAGNOSIS — K5520 Angiodysplasia of colon without hemorrhage: Secondary | ICD-10-CM

## 2024-10-07 DIAGNOSIS — Z1152 Encounter for screening for COVID-19: Secondary | ICD-10-CM | POA: Diagnosis not present

## 2024-10-07 DIAGNOSIS — Z6833 Body mass index (BMI) 33.0-33.9, adult: Secondary | ICD-10-CM | POA: Diagnosis not present

## 2024-10-07 DIAGNOSIS — K2970 Gastritis, unspecified, without bleeding: Secondary | ICD-10-CM

## 2024-10-07 DIAGNOSIS — E66811 Obesity, class 1: Secondary | ICD-10-CM | POA: Diagnosis present

## 2024-10-07 DIAGNOSIS — I69319 Unspecified symptoms and signs involving cognitive functions following cerebral infarction: Secondary | ICD-10-CM | POA: Diagnosis not present

## 2024-10-07 DIAGNOSIS — D62 Acute posthemorrhagic anemia: Secondary | ICD-10-CM | POA: Diagnosis present

## 2024-10-07 DIAGNOSIS — I071 Rheumatic tricuspid insufficiency: Secondary | ICD-10-CM | POA: Diagnosis present

## 2024-10-07 DIAGNOSIS — N179 Acute kidney failure, unspecified: Secondary | ICD-10-CM | POA: Diagnosis present

## 2024-10-07 HISTORY — PX: HOT HEMOSTASIS: SHX5433

## 2024-10-07 HISTORY — PX: POLYPECTOMY: SHX149

## 2024-10-07 HISTORY — PX: BIOPSY OF SKIN SUBCUTANEOUS TISSUE AND/OR MUCOUS MEMBRANE: SHX6741

## 2024-10-07 HISTORY — PX: ESOPHAGOGASTRODUODENOSCOPY: SHX5428

## 2024-10-07 HISTORY — PX: HEMOSTASIS CLIP PLACEMENT: SHX6857

## 2024-10-07 LAB — RENAL FUNCTION PANEL
Albumin: 3.7 g/dL (ref 3.5–5.0)
Anion gap: 12 (ref 5–15)
BUN: 19 mg/dL (ref 8–23)
CO2: 26 mmol/L (ref 22–32)
Calcium: 10.4 mg/dL — ABNORMAL HIGH (ref 8.9–10.3)
Chloride: 104 mmol/L (ref 98–111)
Creatinine, Ser: 1.17 mg/dL — ABNORMAL HIGH (ref 0.44–1.00)
GFR, Estimated: 45 mL/min — ABNORMAL LOW
Glucose, Bld: 88 mg/dL (ref 70–99)
Phosphorus: 3.3 mg/dL (ref 2.5–4.6)
Potassium: 4 mmol/L (ref 3.5–5.1)
Sodium: 142 mmol/L (ref 135–145)

## 2024-10-07 LAB — TYPE AND SCREEN
ABO/RH(D): A POS
Antibody Screen: NEGATIVE
Unit division: 0
Unit division: 0

## 2024-10-07 LAB — CBC
HCT: 25.6 % — ABNORMAL LOW (ref 36.0–46.0)
Hemoglobin: 7.7 g/dL — ABNORMAL LOW (ref 12.0–15.0)
MCH: 22.3 pg — ABNORMAL LOW (ref 26.0–34.0)
MCHC: 30.1 g/dL (ref 30.0–36.0)
MCV: 74 fL — ABNORMAL LOW (ref 80.0–100.0)
Platelets: 255 K/uL (ref 150–400)
RBC: 3.46 MIL/uL — ABNORMAL LOW (ref 3.87–5.11)
RDW: 19.4 % — ABNORMAL HIGH (ref 11.5–15.5)
WBC: 7.9 K/uL (ref 4.0–10.5)
nRBC: 0.3 % — ABNORMAL HIGH (ref 0.0–0.2)

## 2024-10-07 LAB — BPAM RBC
Blood Product Expiration Date: 202601052359
Blood Product Expiration Date: 202601242359
ISSUE DATE / TIME: 202512290543
ISSUE DATE / TIME: 202512282241
Unit Type and Rh: 6200
Unit Type and Rh: 6200

## 2024-10-07 LAB — MAGNESIUM: Magnesium: 2.4 mg/dL (ref 1.7–2.4)

## 2024-10-07 MED ORDER — APIXABAN 5 MG PO TABS
5.0000 mg | ORAL_TABLET | Freq: Two times a day (BID) | ORAL | Status: DC
  Administered 2024-10-07 – 2024-10-08 (×2): 5 mg via ORAL
  Filled 2024-10-07 (×2): qty 1

## 2024-10-07 MED ORDER — LIDOCAINE HCL (CARDIAC) PF 100 MG/5ML IV SOSY
PREFILLED_SYRINGE | INTRAVENOUS | Status: DC | PRN
  Administered 2024-10-07: 30 mg via INTRAVENOUS

## 2024-10-07 MED ORDER — EMPAGLIFLOZIN 10 MG PO TABS
10.0000 mg | ORAL_TABLET | Freq: Every day | ORAL | Status: DC
  Administered 2024-10-07 – 2024-10-08 (×2): 10 mg via ORAL
  Filled 2024-10-07 (×2): qty 1

## 2024-10-07 MED ORDER — PROPOFOL 1000 MG/100ML IV EMUL
INTRAVENOUS | Status: AC
  Filled 2024-10-07: qty 100

## 2024-10-07 MED ORDER — PHENYLEPHRINE HCL (PRESSORS) 10 MG/ML IV SOLN
INTRAVENOUS | Status: DC | PRN
  Administered 2024-10-07 (×2): 80 ug via INTRAVENOUS

## 2024-10-07 MED ORDER — SODIUM CHLORIDE 0.9 % IV SOLN: INTRAVENOUS | Status: DC | PRN

## 2024-10-07 MED ORDER — IRON SUCROSE 300 MG IVPB - SIMPLE MED
300.0000 mg | Freq: Once | Status: AC
  Administered 2024-10-07: 300 mg via INTRAVENOUS
  Filled 2024-10-07: qty 300

## 2024-10-07 MED ORDER — PANTOPRAZOLE SODIUM 40 MG PO TBEC
40.0000 mg | DELAYED_RELEASE_TABLET | Freq: Two times a day (BID) | ORAL | Status: DC
  Administered 2024-10-07 – 2024-10-08 (×3): 40 mg via ORAL
  Filled 2024-10-07 (×3): qty 1

## 2024-10-07 MED ORDER — PROPOFOL 500 MG/50ML IV EMUL
INTRAVENOUS | Status: DC | PRN
  Administered 2024-10-07: 350 mg via INTRAVENOUS

## 2024-10-07 MED ORDER — FUROSEMIDE 40 MG PO TABS
60.0000 mg | ORAL_TABLET | Freq: Every day | ORAL | Status: DC
  Administered 2024-10-07 – 2024-10-08 (×2): 60 mg via ORAL
  Filled 2024-10-07 (×2): qty 1

## 2024-10-07 MED ORDER — PROCHLORPERAZINE EDISYLATE 10 MG/2ML IJ SOLN
5.0000 mg | Freq: Once | INTRAMUSCULAR | Status: AC
  Administered 2024-10-07: 5 mg via INTRAVENOUS
  Filled 2024-10-07: qty 2

## 2024-10-07 NOTE — Evaluation (Signed)
 Physical Therapy Evaluation Patient Details Name: LABERTA WILBON MRN: [id number] DOB: May 10, 1936 Today's Date: 10/07/2024  History of Present Illness  88 yo female admitted with GI bleed, ABLA, nausea, dizziness, fatigue. Hx of Afib, CVA--mild aphasia, vision impairment, R hemiparesis, neuropathy, anemia, breast Ca s/p chemotherapy/mastectomy, bladder Ca, HF  Clinical Impression  On eval, pt required Min A for mobility. Arrived to room while pt was ambulating from bathroom to bed with use of RW with nursing. Pt drowsy and had a nausea spell just prior to walking out of bathroom. Deferred further ambulation at this time. Once seated EOB, pt was able to rise and move closer to bedrail, with CGA and cues from therapist, before returning to supine. Small amount of assist for LEs onto bed. Spoke briefly with patient and her husband (daughter not present at time of eval). Discussed HHPT f/u-pt did not seem to think she would need it-reports daughter assists with all needs. I have observed daughter ambulating with pt in hallway prior to today. Will recommend HHPT vs no follow up depending on continued progress. Will plan to follow pt during this hospital stay.       If plan is discharge home, recommend the following: A little help with walking and/or transfers;A little help with bathing/dressing/bathroom;Assistance with cooking/housework;Assist for transportation;Help with stairs or ramp for entrance   Can travel by private vehicle        Equipment Recommendations None recommended by PT  Recommendations for Other Services       Functional Status Assessment Patient has had a recent decline in their functional status and demonstrates the ability to make significant improvements in function in a reasonable and predictable amount of time.     Precautions / Restrictions Precautions Precautions: Fall Restrictions Weight Bearing Restrictions Per Provider Order: No      Mobility  Bed  Mobility Overal bed mobility: Needs Assistance Bed Mobility: Sit to Supine       Sit to supine: Min assist   General bed mobility comments: small amount of assist for LEs. increased time.    Transfers Overall transfer level: Needs assistance Equipment used: None Transfers: Sit to/from Stand Sit to Stand: Contact guard assist           General transfer comment: Cues for pt to stand and step close to bedrail before lying down. Increased time    Ambulation/Gait Ambulation/Gait assistance: Min assist   Assistive device: Rolling walker (2 wheels) Gait Pattern/deviations: Step-through pattern, Decreased stride length       General Gait Details: Observed pt ambulating from bathroom to bed with use of RW (nursing assisting). Slow gait speed. Cues for safety. RW too high so issued a youth height wallker for pt to use next session  Stairs            Wheelchair Mobility     Tilt Bed    Modified Rankin (Stroke Patients Only)       Balance Overall balance assessment: Needs assistance         Standing balance support: Bilateral upper extremity supported, During functional activity, Reliant on assistive device for balance Standing balance-Leahy Scale: Fair                               Pertinent Vitals/Pain Pain Assessment Pain Assessment: No/denies pain    Home Living Family/patient expects to be discharged to:: Private residence Living Arrangements: Children;Spouse/significant other Available Help at Discharge: Family;Available 24  hours/day Type of Home: House Home Access: Stairs to enter Entrance Stairs-Rails: Left;Right     Home Layout: Able to live on main level with bedroom/bathroom Home Equipment: Agricultural Consultant (2 wheels);Shower seat;Cane - quad;Grab bars - tub/shower;Adaptive equipment;Cane - single point (3 wheeled walker)      Prior Function Prior Level of Function : Needs assist             Mobility Comments: uses tripod  walker outside       Extremity/Trunk Assessment   Upper Extremity Assessment Upper Extremity Assessment: Defer to OT evaluation    Lower Extremity Assessment Lower Extremity Assessment: Generalized weakness    Cervical / Trunk Assessment Cervical / Trunk Assessment: Normal  Communication        Cognition Arousal: Alert (but drowsy (EGD earlier)) Behavior During Therapy: Flat affect, WFL for tasks assessed/performed                             Following commands: Intact       Cueing Cueing Techniques: Verbal cues     General Comments      Exercises     Assessment/Plan    PT Assessment Patient needs continued PT services  PT Problem List Decreased strength;Decreased activity tolerance;Decreased balance;Decreased mobility;Decreased knowledge of use of DME       PT Treatment Interventions DME instruction;Gait training;Functional mobility training;Therapeutic activities;Therapeutic exercise;Patient/family education;Balance training    PT Goals (Current goals can be found in the Care Plan section)  Acute Rehab PT Goals Patient Stated Goal: none stated Time For Goal Achievement: 10/21/24 Potential to Achieve Goals: Good    Frequency Min 3X/week     Co-evaluation               AM-PAC PT 6 Clicks Mobility  Outcome Measure Help needed turning from your back to your side while in a flat bed without using bedrails?: A Little Help needed moving from lying on your back to sitting on the side of a flat bed without using bedrails?: A Little Help needed moving to and from a bed to a chair (including a wheelchair)?: A Little Help needed standing up from a chair using your arms (e.g., wheelchair or bedside chair)?: A Little Help needed to walk in hospital room?: A Little Help needed climbing 3-5 steps with a railing? : A Little 6 Click Score: 18    End of Session Equipment Utilized During Treatment: Gait belt Activity Tolerance: Patient tolerated  treatment well Patient left: in bed;with call bell/phone within reach;with bed alarm set;with family/visitor present   PT Visit Diagnosis: Muscle weakness (generalized) (M62.81);Difficulty in walking, not elsewhere classified (R26.2)    Time: 8452-8444 PT Time Calculation (min) (ACUTE ONLY): 8 min   Charges:   PT Evaluation $PT Eval Low Complexity: 1 Low   PT General Charges $$ ACUTE PT VISIT: 1 Visit           Dannial SQUIBB, PT Acute Rehabilitation  Office: [phone]

## 2024-10-07 NOTE — Anesthesia Postprocedure Evaluation (Signed)
"   Anesthesia Post Note  Patient: Loretta Gates  Procedure(s) Performed: EGD (ESOPHAGOGASTRODUODENOSCOPY) EGD, WITH ARGON PLASMA COAGULATION POLYPECTOMY, INTESTINE CONTROL OF HEMORRHAGE, GI TRACT, ENDOSCOPIC, BY CLIPPING OR OVERSEWING BIOPSY, SKIN, SUBCUTANEOUS TISSUE, OR MUCOUS MEMBRANE     Patient location during evaluation: Endoscopy Anesthesia Type: MAC Level of consciousness: awake and alert Pain management: pain level controlled Vital Signs Assessment: post-procedure vital signs reviewed and stable Respiratory status: spontaneous breathing, nonlabored ventilation, respiratory function stable and patient connected to nasal cannula oxygen Cardiovascular status: stable and blood pressure returned to baseline Postop Assessment: no apparent nausea or vomiting Anesthetic complications: no   No notable events documented.  Last Vitals:  Vitals:   10/07/24 1130 10/07/24 1345  BP: (!) 151/71 (!) 133/90  Pulse:  99  Resp: 16 18  Temp:  36.4 C  SpO2: 95% 100%    Last Pain:  Vitals:   10/07/24 1345  TempSrc: Oral  PainSc:                  Loretta Gates      "

## 2024-10-07 NOTE — Telephone Encounter (Signed)
 Patient referred to infusion pharmacy team for ambulatory infusion of IV iron .  Insurance - Advertising Copywriter of care - Site of care: [facility] Dx code - K92.2; D50.9 IV Iron  Therapy - Venofer  300mg  x 2 Infusion appointments - Scheduling team will schedule patient as soon as possible.    Venofer  300mg  x 1 received inpatient today, 10/07/2024  Sherry Pennant, PharmD, MPH, BCPS, CPP Clinical Pharmacist

## 2024-10-07 NOTE — Progress Notes (Signed)
 OT Cancellation Note  Patient Details Name: SAYRA FRISBY MRN: [id number] DOB: Jan 05, 1936   Cancelled Treatment:    Reason Eval/Treat Not Completed: Patient at procedure or test/ unavailable Pt leaving for EGD this AM. Unable to complete OT eval at this time. Will continue to follow pt and evaluate when able.   Leita Howell, OTR/L,CBIS  Supplemental OT - [facility] Secure Chat Preferred   10/07/2024, 8:53 AM

## 2024-10-07 NOTE — Progress Notes (Signed)
 " PROGRESS NOTE  Loretta Gates FMW:[id number] DOB: [birth year]   PCP: [name], MD  Patient is from: Home.  DOA: 10/05/2024 LOS: 0  Chief complaints Chief Complaint  Patient presents with   Nausea   Dizziness     Brief Narrative / Interim history: 88 year old F with PMH of left breast cancer s/p mastectomy,  HFpEF, A-fib on Eliquis , left MCA CVA, left ICA stenosis s/p stenting, GIB and IDA presented to ED with GI bleed, nausea, dizziness, fatigue, diarrhea and shortness of breath for about 4 to 5 days, and admitted with symptomatic acute blood loss anemia due to GI bleed.  In ED, stable vitals.  Hgb 6.3 (was 9.6 on 10/22 and 13.0 on 3/19).  Hemoccult positive.  CT abdomen and pelvis without acute finding but showed diverticulosis, trace pelvic free fluid and irregularity of the liver margins which could reflect cirrhosis.  Started on IV Protonix .  1 unit of blood ordered.  GI consulted, and patient was admitted for further care.  The next day, hemoglobin improved to 8.3 after 2 units.   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  GI recommended advancing diet as tolerated, p.o. Protonix  40 mg twice daily for 4 weeks and 40 mg daily afterward.     Subjective: Seen and examined earlier this afternoon after returning from EGD.  Patient's daughter and husband at bedside.  Patient reports nausea.  Denies pain or shortness of breath.  Anemia panel with iron  deficiency.  Initially, daughter was concerned about IV iron  stating that she received iron  on Monday and had to go back to hospital on Friday with the help to remove fluid last year.  I have discussed with pharmacy and we are not able to confirm this from chart review.  This is likely CHF exacerbation versus reaction to iron .  After risk-benefit  discussion, daughter and husband wants iron  infusion.    Assessment and plan: Acute on chronic blood loss anemia due to GI bleed: Hemoccult positive.  CT abdomen and pelvis with contrast without acute finding.  Patient is on Eliquis  and aspirin .  Reportedly taken off Brilinta  about 2 weeks ago.  Last dose of Eliquis  the morning of 12/28.  Anemia panel with severe iron  deficiency.  EGD as above. Recent Labs    12/26/23 1124 07/30/24 1000 10/05/24 1027 10/05/24 1634 10/05/24 2030 10/06/24 0324 10/06/24 0856 10/06/24 1025 10/07/24 0422  HGB 13.0 9.6* 6.3* 6.1* 6.0* 7.0* 8.3* 9.0* 7.7*  - Transfused 2 units of blood with appropriate response. - IV Venofer  300 mg x 1.  Risk and benefit discussed with daughter and husband.. - Continue Protonix  40 mg twice daily for 4 weeks followed by 40 mg daily per GI - Per GI, okay to resume Eliquis  this evening.  Daughter is very anxious about holding Eliquis  due to prior stroke - Recheck CBC in the morning.  Gastric polyps/gastritis/duodenal AVM s/p polypectomy for erythematous gastric polyp and APC for AVM. - PPI as above  Chronic HFpEF: No cardiopulmonary symptoms at the moment.  TTE in 10/2022 with LVEF of 55 to 60%, moderately elevated PASP, moderate LAE and mild to moderate TR.  Appears euvolemic on exam.  On Lasix  60 mg daily and Jardiance  10 mg daily at home.  Appears euvolemic on exam.  Denies cardiopulmonary symptoms. -Resume home Jardiance  and Lasix . -Strict intake and output, daily weight,  renal functions and electrolytes  Persistent atrial fibrillation: Rate controlled. - Resume home metoprolol  at 75 mg twice daily - Continue holding Eliquis  in the setting of GI bleed - Optimize electrolytes  AKI: Baseline creatinine 0.8-0.9.  Likely due to poor p.o. intake in the setting of nausea, vomiting, abdominal pain Recent Labs    12/26/23 1124 07/30/24 1000 10/05/24 1027 10/06/24 0856 10/07/24 0422  BUN 24 19 24* 20 19  CREATININE 0.89  0.99 1.32* 1.25* 1.17*  - Recheck in the morning.  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  -Continue holding aspirin . -Okay to resume Eliquis  this evening per GI -PT/OT eval  Possible liver cirrhosis: CT abdomen and pelvis raises concern for liver cirrhosis. -Outpatient follow-up with GI   History of left breast cancer s/p mastectomy w/ node dissection's ; completed chemotherapy 2008   Hypokalemia -Monitor replenish K and Mg as appropriate   Overactive bladder - Continue home oxybutynin   Chronic constipation -MiraLAX  as needed.   Class I obesity Body mass index is 33.54 kg/m.           DVT prophylaxis:  Place TED hose Start: 10/05/24 1912 apixaban  (ELIQUIS ) tablet 5 mg  Code Status: Full code Family Communication: Updated patient's daughter and husband at bedside. Level of care: Telemetry Status is: Inpatient The patient will remain inpatient because: Acute on chronic blood loss anemia due to GI bleed.    Final disposition: Likely home once medically stable   55 minutes with more than 50% spent in reviewing records, counseling patient/family and coordinating care.  Consultants:  Gastroenterology  Procedures: 12/30-EGD as above.  See details under procedure  Microbiology summarized: COVID-19, influenza and RSV PCR nonreactive  Objective: Vitals:   10/07/24 1100 10/07/24 1110 10/07/24 1120 10/07/24 1130  BP: 111/77 136/64 136/67 (!) 151/71  Pulse: 75 71 86   Resp: (!) 26 19 20 16   Temp:      TempSrc:      SpO2: 94% 94% 99% 95%  Weight:      Height:        Examination:  GENERAL: No apparent distress.  Nontoxic. HEENT: MMM.  Vision and hearing grossly intact.  NECK: Supple.  No apparent  JVD.  RESP:  No IWOB.  Fair aeration bilaterally. CVS:  RRR. Heart sounds normal.  ABD/GI/GU: BS+. Abd soft, NTND.  MSK/EXT:  Moves extremities. No apparent deformity. No edema.  SKIN: no apparent skin lesion or wound NEURO: AA.  Oriented to self and family.  No apparent focal neuro deficit. PSYCH: Calm. Normal affect.   Sch Meds:  Scheduled Meds:  sodium chloride    Intravenous Once   sodium chloride    Intravenous Once   apixaban   5 mg Oral BID   metoprolol  tartrate  75 mg Oral BID   oxybutynin   10 mg Oral QHS   pantoprazole   40 mg Oral BID   Continuous Infusions: PRN Meds:.lip balm, morphine  injection, ondansetron  **OR** ondansetron  (ZOFRAN ) IV, mouth rinse, polyethylene glycol  Antimicrobials: Anti-infectives (From admission, onward)    None        I have personally reviewed the following labs and images: CBC: Recent Labs  Lab 10/05/24 1027 10/05/24 1634 10/05/24 2030 10/06/24 0324 10/06/24 0856 10/06/24 1025 10/07/24 0422  WBC 10.2  --   --   --  8.3  --  7.9  HGB 6.3*   < > 6.0* 7.0* 8.3* 9.0* 7.7*  HCT 21.9*   < > 21.1* 22.9* 26.9* 30.0* 25.6*  MCV 68.4*  --   --   --  73.9*  --  74.0*  PLT 348  --   --   --  305  --  255   < > = values in this interval not displayed.   BMP &GFR Recent Labs  Lab 10/05/24 1027 10/06/24 0856 10/07/24 0422  NA 140 145 142  K 3.5 3.3* 4.0  CL 99 104 104  CO2 28 28 26   GLUCOSE 111* 95 88  BUN 24* 20 19  CREATININE 1.32* 1.25* 1.17*  CALCIUM 10.3 10.2 10.4*  MG  --  2.3 2.4  PHOS  --   --  3.3   Estimated Creatinine Clearance: 29.3 mL/min (A) (by C-G formula based on SCr of 1.17 mg/dL (H)). Liver & Pancreas: Recent Labs  Lab 10/05/24 1027 10/06/24 0856 10/07/24 0422  AST 27 26  --   ALT 20 19  --   ALKPHOS 51 56  --   BILITOT 1.0 1.5*  --   PROT 6.7 6.5  --   ALBUMIN 4.2 4.0 3.7   Recent Labs  Lab 10/05/24 1027  LIPASE 20   No results for input(s): AMMONIA in the last 168 hours. Diabetic: No  results for input(s): HGBA1C in the last 72 hours. No results for input(s): GLUCAP in the last 168 hours. Cardiac Enzymes: No results for input(s): CKTOTAL, CKMB, CKMBINDEX, TROPONINI in the last 168 hours. No results for input(s): PROBNP in the last 8760 hours. Coagulation Profile: No results for input(s): INR, PROTIME in the last 168 hours. Thyroid Function Tests: No results for input(s): TSH, T4TOTAL, FREET4, T3FREE, THYROIDAB in the last 72 hours. Lipid Profile: No results for input(s): CHOL, HDL, LDLCALC, TRIG, CHOLHDL, LDLDIRECT in the last 72 hours. Anemia Panel: Recent Labs    10/06/24 0856  VITAMINB12 1,177*  FOLATE 9.1  FERRITIN 8*  TIBC 477*  IRON  67  RETICCTPCT 1.3   Urine analysis:    Component Value Date/Time   COLORURINE STRAW (A) 10/05/2024 1006   APPEARANCEUR CLEAR 10/05/2024 1006   LABSPEC 1.015 10/05/2024 1006   PHURINE 7.0 10/05/2024 1006   GLUCOSEU 250 (A) 10/05/2024 1006   HGBUR NEGATIVE 10/05/2024 1006   BILIRUBINUR NEGATIVE 10/05/2024 1006   BILIRUBINUR negative 03/23/2023 0951   KETONESUR NEGATIVE 10/05/2024 1006   PROTEINUR NEGATIVE 10/05/2024 1006   UROBILINOGEN 0.2 03/23/2023 0951   NITRITE NEGATIVE 10/05/2024 1006   LEUKOCYTESUR TRACE (A) 10/05/2024 1006   Sepsis Labs: Invalid input(s): PROCALCITONIN, LACTICIDVEN  Microbiology: Recent Results (from the past 240 hours)  Resp panel by RT-PCR (RSV, Flu A&B, Covid) Anterior Nasal Swab     Status: None   Collection Time: 10/05/24 10:06 AM   Specimen: Anterior Nasal Swab  Result Value Ref Range Status   SARS Coronavirus 2 by RT PCR NEGATIVE NEGATIVE Final    Comment: (NOTE) SARS-CoV-2 target nucleic acids are NOT DETECTED.  The SARS-CoV-2 RNA is generally detectable in upper respiratory specimens during the acute phase of infection. The lowest concentration of SARS-CoV-2 viral copies this assay can detect is 138 copies/mL. A negative result does  not preclude SARS-Cov-2 infection and should not be used as the sole basis for treatment or other patient management decisions. A negative result may occur with  improper specimen collection/handling, submission of specimen other than nasopharyngeal swab,  presence of viral mutation(s) within the areas targeted by this assay, and inadequate number of viral copies(<138 copies/mL). A negative result must be combined with clinical observations, patient history, and epidemiological information. The expected result is Negative.  Fact Sheet for Patients:  bloggercourse.com  Fact Sheet for Healthcare Providers:  seriousbroker.it  This test is no t yet approved or cleared by the [place]  FDA and  has been authorized for detection and/or diagnosis of SARS-CoV-2 by FDA under an Emergency Use Authorization (EUA). This EUA will remain  in effect (meaning this test can be used) for the duration of the COVID-19 declaration under Section 564(b)(1) of the Act, 21 U.S.C.section 360bbb-3(b)(1), unless the authorization is terminated  or revoked sooner.       Influenza A by PCR NEGATIVE NEGATIVE Final   Influenza B by PCR NEGATIVE NEGATIVE Final    Comment: (NOTE) The Xpert Xpress SARS-CoV-2/FLU/RSV plus assay is intended as an aid in the diagnosis of influenza from Nasopharyngeal swab specimens and should not be used as a sole basis for treatment. Nasal washings and aspirates are unacceptable for Xpert Xpress SARS-CoV-2/FLU/RSV testing.  Fact Sheet for Patients: bloggercourse.com  Fact Sheet for Healthcare Providers: seriousbroker.it  This test is not yet approved or cleared by the [place]  FDA and has been authorized for detection and/or diagnosis of SARS-CoV-2 by FDA under an Emergency Use Authorization (EUA). This EUA will remain in effect (meaning this test can be used) for the  duration of the COVID-19 declaration under Section 564(b)(1) of the Act, 21 U.S.C. section 360bbb-3(b)(1), unless the authorization is terminated or revoked.     Resp Syncytial Virus by PCR NEGATIVE NEGATIVE Final    Comment: (NOTE) Fact Sheet for Patients: bloggercourse.com  Fact Sheet for Healthcare Providers: seriousbroker.it  This test is not yet approved or cleared by the [place]  FDA and has been authorized for detection and/or diagnosis of SARS-CoV-2 by FDA under an Emergency Use Authorization (EUA). This EUA will remain in effect (meaning this test can be used) for the duration of the COVID-19 declaration under Section 564(b)(1) of the Act, 21 U.S.C. section 360bbb-3(b)(1), unless the authorization is terminated or revoked.  Performed at [facility], [address], [place], [place] [address]     Radiology Studies: No results found.     Juniper Snyders T. Kili Gracy [facility]  If 7PM-7AM, please contact night-coverage [email or web address] 10/07/2024, 1:28 PM   "

## 2024-10-07 NOTE — Interval H&P Note (Signed)
 History and Physical Interval Note:  No acute events overnight.  Transfused 2 units RBCs yesterday.  Hemoglobin slight downtrend at 7.7 today, but no overt bleeding overnight.  Has been off Xarelto with last dose on 12/20 8 in the morning.  AKI improving with creatinine 1.17 today.  Plan to proceed with EGD for diagnostic and therapeutic intent.  10/07/2024 9:52 AM  Loretta Gates  has presented today for surgery, with the diagnosis of Anemia.  The various methods of treatment have been discussed with the patient and family. After consideration of risks, benefits and other options for treatment, the patient has consented to  Procedures: EGD (ESOPHAGOGASTRODUODENOSCOPY) (N/A) as a surgical intervention.  The patient's history has been reviewed, patient examined, no change in status, stable for surgery.  I have reviewed the patient's chart and labs.  Questions were answered to the patient's satisfaction.     Sandor GAILS Jerel Sardina

## 2024-10-07 NOTE — Transfer of Care (Signed)
 Immediate Anesthesia Transfer of Care Note  Patient: Loretta Gates  Procedure(s) Performed: EGD (ESOPHAGOGASTRODUODENOSCOPY) EGD, WITH ARGON PLASMA COAGULATION POLYPECTOMY, INTESTINE CONTROL OF HEMORRHAGE, GI TRACT, ENDOSCOPIC, BY CLIPPING OR OVERSEWING BIOPSY, SKIN, SUBCUTANEOUS TISSUE, OR MUCOUS MEMBRANE  Patient Location: Endoscopy Unit  Anesthesia Type:MAC  Level of Consciousness: awake and alert   Airway & Oxygen Therapy: Patient Spontanous Breathing and Patient connected to face mask oxygen  Post-op Assessment: Report given to RN and Post -op Vital signs reviewed and stable  Post vital signs: Reviewed and stable  Last Vitals:  Vitals Value Taken Time  BP 95/46 10/07/24 10:38  Temp 36.2 C 10/07/24 10:35  Pulse 84 10/07/24 10:39  Resp 13 10/07/24 10:39  SpO2 98 % 10/07/24 10:39  Vitals shown include unfiled device data.  Last Pain:  Vitals:   10/07/24 1035  TempSrc: Temporal  PainSc:          Complications: No notable events documented.

## 2024-10-07 NOTE — Op Note (Signed)
 [facility] Patient Name: Loretta Gates Procedure Date: 10/07/2024 MRN: [id number] Attending MD: Sandor Flatter , MD, [id number] Date of Birth: 1936/01/19 CSN: [id number] Age: 88 Admit Type: Inpatient Procedure:                Upper GI endoscopy Indications:              Iron  deficiency anemia, Symptomatic anemia, FOBT+                            stool Providers:                Sandor Flatter, MD, Jacquelyn Jaci Pierce, RN,                            Corene Southgate, Technician Referring MD:              Medicines:                Monitored Anesthesia Care Complications:            No immediate complications. Estimated Blood Loss:     Estimated blood loss was minimal. Procedure:                Pre-Anesthesia Assessment:                           - Prior to the procedure, a History and Physical                            was performed, and patient medications and                            allergies were reviewed. The patient's tolerance of                            previous anesthesia was also reviewed. The risks                            and benefits of the procedure and the sedation                            options and risks were discussed with the patient.                            All questions were answered, and informed consent                            was obtained. Prior Anticoagulants: The patient has                            taken Eliquis  (apixaban ), last dose was 2 days                            prior to procedure. ASA Grade Assessment: IV - A  patient with severe systemic disease that is a                            constant threat to life. After reviewing the risks                            and benefits, the patient was deemed in                            satisfactory condition to undergo the procedure.                           After obtaining informed consent, the endoscope was                            passed under  direct vision. Throughout the                            procedure, the patient's blood pressure, pulse, and                            oxygen saturations were monitored continuously. The                            [id number]-H190 ([id number]) Olympus endoscope was introduced                            through the mouth, and advanced to the third part                            of duodenum. The upper GI endoscopy was                            accomplished without difficulty. The patient                            tolerated the procedure well. Scope In: Scope Out: Findings:      The examined esophagus was normal.      The Z-line was regular and was found 39 cm from the incisors.      Multiple 2 to 5 mm sessile polyps were found in the gastric fundus and       in the gastric body. The majority of these polyps appeared to be       consistent with benign fundic gland polyps. There was 1 polyp in the       gastric body that was moderately erythematous and somewhat friable.       Based on endoscopic appearance, risk of bleeding with resumption of       anticoagulation, I elected to remove this polyp with a hot snare.       Resection and retrieval were complete. To close a defect after       polypectomy, one hemostatic clip was successfully placed (MR       conditional). Clip manufacturer: [employer]. There was no       bleeding at the end of  the procedure.      Localized moderate inflammation characterized by erythema was found in       the gastric antrum. Biopsies were taken with a cold forceps for       histology and Helicobacter pylori testing. Estimated blood loss was       minimal.      A single small angioectasia with typical arborization was found in the       second portion of the duodenum. Coagulation for hemostasis using argon       plasma was successful. Estimated blood loss: none.      The mucosa throughout the remainder of the duodenum to the third portion       of the duodenum  was otherwise normal-appearing. Impression:               - Normal esophagus.                           - Z-line regular, 39 cm from the incisors.                           - Multiple benign fundic gland polyps.                           - One gastric polyp that was moderately                            erythematous and somewhat friable. Resected with                            hot snare and polypectomy site was clipped closed                            with a single clip.                           - Moderate antral, non-ulcer gastritis. Biopsied.                           - A single angioectasia in the duodenum. Treated                            with argon plasma coagulation (APC).                           - Normal mucosa was found in the duodenal bulb, in                            the first portion of the duodenum, in the second                            portion of the duodenum and in the third portion of                            the duodenum. Moderate Sedation:      Not Applicable - Patient had care per Anesthesia. Recommendation:           -  Return patient to hospital ward for ongoing care.                           - Advance diet as tolerated.                           - Resume Eliquis  (apixaban ) at prior dose tomorrow.                           - Await pathology results.                           - If concern for ongoing bleeding or recurrence of                            bleeding/anemia, plan for further small bowel                            interrogation with Video Capsule Endoscopy (VCE) to                            evaluate for any additional small bowel AVMs that                            may require endoscopic intervention, +/-                            colonoscopy.                           - Would benefit from IV iron  during this hospital                            admission.                           - Continue trending serial CBCs with additional                             blood products as needed per protocol.                           - If no further bleeding, plan for repeat CBC check                            7-10 days after hospital discharge to ensure                            returning to baseline.                           - Continue Protonix  40 mg p.o. twice daily for 4                            weeks to aid in mucosal healing of  gastritis and                            APC treatment site. After 4 weeks, can reduce to 40                            mg daily then transition back to Prilosec 40 mg                            daily for continued reflux management. Procedure Code(s):        --- Professional ---                           [id number], 59, Esophagogastroduodenoscopy, flexible,                            transoral; with control of bleeding, any method                           43251, Esophagogastroduodenoscopy, flexible,                            transoral; with removal of tumor(s), polyp(s), or                            other lesion(s) by snare technique                           43239, 59, Esophagogastroduodenoscopy, flexible,                            transoral; with biopsy, single or multiple Diagnosis Code(s):        --- Professional ---                           K31.7, Polyp of stomach and duodenum                           K29.70, Gastritis, unspecified, without bleeding                           K31.819, Angiodysplasia of stomach and duodenum                            without bleeding                           D50.9, Iron  deficiency anemia, unspecified CPT copyright 2022 [employer]. All rights reserved. The codes documented in this report are preliminary and upon coder review may  be revised to meet current compliance requirements. Sandor Flatter, MD 10/07/2024 10:48:35 AM Number of Addenda: 0

## 2024-10-07 NOTE — Progress Notes (Signed)
 Patient up to toilet with dry heaves.  CCMD notified this RN patient had bradycardia 37 with SVR 2.97 seconds.  MD notified. Compazine  given.  Patient assisted back to bed.  Will continue to monitor.

## 2024-10-08 ENCOUNTER — Other Ambulatory Visit (HOSPITAL_COMMUNITY): Payer: Self-pay

## 2024-10-08 DIAGNOSIS — K2970 Gastritis, unspecified, without bleeding: Secondary | ICD-10-CM

## 2024-10-08 DIAGNOSIS — I480 Paroxysmal atrial fibrillation: Secondary | ICD-10-CM

## 2024-10-08 DIAGNOSIS — D131 Benign neoplasm of stomach: Secondary | ICD-10-CM

## 2024-10-08 DIAGNOSIS — K2950 Unspecified chronic gastritis without bleeding: Secondary | ICD-10-CM | POA: Diagnosis not present

## 2024-10-08 DIAGNOSIS — D649 Anemia, unspecified: Secondary | ICD-10-CM

## 2024-10-08 DIAGNOSIS — D62 Acute posthemorrhagic anemia: Secondary | ICD-10-CM | POA: Diagnosis not present

## 2024-10-08 DIAGNOSIS — D509 Iron deficiency anemia, unspecified: Secondary | ICD-10-CM

## 2024-10-08 DIAGNOSIS — K5520 Angiodysplasia of colon without hemorrhage: Secondary | ICD-10-CM

## 2024-10-08 DIAGNOSIS — K2990 Gastroduodenitis, unspecified, without bleeding: Secondary | ICD-10-CM | POA: Diagnosis not present

## 2024-10-08 DIAGNOSIS — K31819 Angiodysplasia of stomach and duodenum without bleeding: Secondary | ICD-10-CM | POA: Diagnosis not present

## 2024-10-08 DIAGNOSIS — K317 Polyp of stomach and duodenum: Secondary | ICD-10-CM | POA: Diagnosis not present

## 2024-10-08 LAB — BASIC METABOLIC PANEL WITH GFR
Anion gap: 12 (ref 5–15)
BUN: 16 mg/dL (ref 8–23)
CO2: 25 mmol/L (ref 22–32)
Calcium: 10.6 mg/dL — ABNORMAL HIGH (ref 8.9–10.3)
Chloride: 103 mmol/L (ref 98–111)
Creatinine, Ser: 1.22 mg/dL — ABNORMAL HIGH (ref 0.44–1.00)
GFR, Estimated: 43 mL/min — ABNORMAL LOW
Glucose, Bld: 106 mg/dL — ABNORMAL HIGH (ref 70–99)
Potassium: 4 mmol/L (ref 3.5–5.1)
Sodium: 141 mmol/L (ref 135–145)

## 2024-10-08 LAB — CBC
HCT: 28.5 % — ABNORMAL LOW (ref 36.0–46.0)
Hemoglobin: 8.4 g/dL — ABNORMAL LOW (ref 12.0–15.0)
MCH: 22.1 pg — ABNORMAL LOW (ref 26.0–34.0)
MCHC: 29.5 g/dL — ABNORMAL LOW (ref 30.0–36.0)
MCV: 75 fL — ABNORMAL LOW (ref 80.0–100.0)
Platelets: 271 K/uL (ref 150–400)
RBC: 3.8 MIL/uL — ABNORMAL LOW (ref 3.87–5.11)
RDW: 20.4 % — ABNORMAL HIGH (ref 11.5–15.5)
WBC: 9.7 K/uL (ref 4.0–10.5)
nRBC: 0.3 % — ABNORMAL HIGH (ref 0.0–0.2)

## 2024-10-08 LAB — RENAL FUNCTION PANEL
Albumin: 3.8 g/dL (ref 3.5–5.0)
Anion gap: 12 (ref 5–15)
BUN: 18 mg/dL (ref 8–23)
CO2: 26 mmol/L (ref 22–32)
Calcium: 10.5 mg/dL — ABNORMAL HIGH (ref 8.9–10.3)
Chloride: 105 mmol/L (ref 98–111)
Creatinine, Ser: 1.16 mg/dL — ABNORMAL HIGH (ref 0.44–1.00)
GFR, Estimated: 46 mL/min — ABNORMAL LOW
Glucose, Bld: 91 mg/dL (ref 70–99)
Phosphorus: 3.4 mg/dL (ref 2.5–4.6)
Potassium: 3.3 mmol/L — ABNORMAL LOW (ref 3.5–5.1)
Sodium: 143 mmol/L (ref 135–145)

## 2024-10-08 LAB — MAGNESIUM: Magnesium: 2.2 mg/dL (ref 1.7–2.4)

## 2024-10-08 MED ORDER — POTASSIUM CHLORIDE 20 MEQ PO PACK: 40.0000 meq | PACK | ORAL | Status: DC

## 2024-10-08 MED ORDER — ASPIRIN 81 MG PO TBEC: 81.0000 mg | DELAYED_RELEASE_TABLET | Freq: Every day | ORAL | Status: AC

## 2024-10-08 MED ORDER — POTASSIUM CHLORIDE 20 MEQ PO PACK
40.0000 meq | PACK | ORAL | Status: DC
  Administered 2024-10-08: 40 meq via ORAL
  Filled 2024-10-08: qty 2

## 2024-10-08 MED ORDER — PANTOPRAZOLE SODIUM 40 MG PO TBEC
40.0000 mg | DELAYED_RELEASE_TABLET | Freq: Two times a day (BID) | ORAL | 2 refills | Status: AC
  Filled 2024-10-08: qty 60, 30d supply, fill #0

## 2024-10-08 MED ORDER — POTASSIUM CHLORIDE 20 MEQ PO PACK: 40.0000 meq | PACK | Freq: Two times a day (BID) | ORAL | Status: DC

## 2024-10-08 NOTE — Progress Notes (Signed)
 PT Cancellation Note  Patient Details Name: Loretta Gates MRN: [id number] DOB: 1936-03-10   Cancelled Treatment:    Reason Eval/Treat Not Completed:  Will update recommendation to no follow up/PT needs. Per OT, daughter has walked pt x 2 and reports pt is back to baseline today. Will sign off.    Dannial SQUIBB, PT Acute Rehabilitation  Office: [phone]

## 2024-10-08 NOTE — Progress Notes (Signed)
 OT Cancellation Note  Patient Details Name: Loretta Gates MRN: [id number] DOB: 1936-02-04   Cancelled Treatment:    Reason Eval/Treat Not Completed: OT screened, no needs identified, will sign off Spoke with patient and daughter regarding baseline functional performance. Pt has good support system at home. Daughter provides assistance if needed. No acute OT needs as pt is at baseline.   Leita Howell, OTR/L,CBIS  Supplemental OT - [facility] Preferred   10/08/2024, 10:46 AM

## 2024-10-08 NOTE — Progress Notes (Signed)
 Discharge Medications delivered from TOC meds to bed [facility] outpatient pharmacy by this RN.

## 2024-10-08 NOTE — Discharge Summary (Signed)
 Physician Discharge Summary  Loretta Gates FMW:[id number] DOB: 06-Sep-1936 DOA: 10/05/2024  PCP: [name], MD  Admit date: 10/05/2024 Discharge date: 10/08/2024  Admitted From: Home Disposition:  Home  Recommendations for Outpatient Follow-up:  Follow up with PCP in 1-2 weeks Please obtain BMP/CBC in one week   Home Health:No Equipment/Devices:None  Discharge Condition:Stable CODE STATUS:FUll Diet recommendation: Heart Healthy  Brief/Interim Summary: 88 y.o. female past medical history of breast cancer status postmastectomy, HFpEF, chronic atrial fibrillation on Eliquis , left CVA, left ICA stenosis status post stenting, history of GI bleed and iron  deficiency anemia presents with GI bleed nausea vomiting diarrhea and shortness of breath that started 4 days prior to admission.  In the ED was found to have a hemoglobin of 6.3 Hemoccult positive CT scan of the abdomen pelvis showed no acute findings she started empirically on IV Protonix , transfuse 2 unit of packed red blood cells and GI was consulted   Discharge Diagnoses:  Principal Problem:   GIB (gastrointestinal bleeding) Active Problems:   Essential hypertension   Atrial fibrillation, chronic (HCC)   History of left breast cancer   Left middle cerebral artery stroke (HCC)   Chronic heart failure with preserved ejection fraction (HFpEF) (HCC)   AKI (acute kidney injury)   Symptomatic anemia   Chronic anticoagulation   Microcytic anemia   Generalized weakness   Gastritis and gastroduodenitis   Gastric polyp   Benign fundic gland polyps of stomach   AVM (arteriovenous malformation) of small bowel, acquired  Acute on chronic blood loss anemia possibly due to gastritis and AVM/acute blood loss anemia: She was taken off Brilinta  about 2 weeks ago her last dose of Eliquis  was on 28 December. Anemia panel shows severe iron  deficiency anemia, she status post IV iron  and 2 units of packed red blood cells her  hemoglobin admission was 6 posttransfusion 08. GI was consulted perform an EGD on 30 December at that showed a gastric polyp erythematous and antral friable none ulcer gastritis, single AVM nonbleeding in the duodenum treated with APC. GI recommended to continue Protonix  twice a day and okay to resume Eliquis . She was able to tolerate her diet.  Chronic HFpEF: No changes made to her medication.  Hypokalemia: Repleted now improved.  Chronic atrial fibrillation: Rate controlled metoprolol , continue Eliquis  as an outpatient.  Acute kidney injury: With a baseline creatinine of 0.8 likely prerenal azotemia resolved with IV fluids.  History of left CVA: Resume Eliquis  continue statins. PT evaluated the patient recommended no home health PT.  Possible liver cirrhosis: Follow-up with GI as an outpatient.  History of left breast cancer status postmastectomy: Noted.  Overactive bladder: Continue oxybutynin .  Chronic constipation: MiraLAX  as needed  Discharge Instructions  Discharge Instructions     Amb Referral to Intravenous Iron  Therapy   Complete by: As directed    You have been referred to [facility] team for IV Iron  Infusions. The infusion pharmacy team will reach out to you with appointment information.    Primary Diagnosis Code for IV Iron : D50.9 - Iron  deficiency Anemia   Secondary diagnosis code for IV iron : Other   Comment: irom deficiency anemia secondary to blood loss      Allergies as of 10/08/2024       Reactions   Bee Venom Swelling   Bee stung her on the inside of her gum. Lips and gums started swelling. No SOB.   Levaquin [levofloxacin] Hives, Itching   Peanut-containing Drug Products Other (See Comments)  Stomach pain and diarrhea         Medication List     PAUSE taking these medications    aspirin  EC 81 MG tablet Wait to take this until your doctor or other care provider tells you to start again. Take 1 tablet (81 mg total) by  mouth daily. Swallow whole.       STOP taking these medications    guaiFENesin  600 MG 12 hr tablet Commonly known as: Mucinex    omeprazole  40 MG capsule Commonly known as: PRILOSEC       TAKE these medications    acetaminophen  325 MG tablet Commonly known as: TYLENOL  Take 2 tablets (650 mg total) by mouth every 4 (four) hours as needed for mild pain (or temp > 37.5 C (99.5 F)).   benzonatate  100 MG capsule Commonly known as: TESSALON  Take 1 capsule (100 mg total) by mouth 2 (two) times daily as needed for cough.   cinacalcet  30 MG tablet Commonly known as: SENSIPAR  Take 1 tablet (30 mg total) by mouth daily with breakfast.   Eliquis  5 MG Tabs tablet Generic drug: apixaban  TAKE ONE TABLET BY MOUTH TWICE A DAY   empagliflozin  10 MG Tabs tablet Commonly known as: Jardiance  Take 1 tablet (10 mg total) by mouth daily.   ergocalciferol  1.25 MG (50000 UT) capsule Commonly known as: VITAMIN D2 Take 50,000 Units by mouth every 14 (fourteen) days. Every other saturday   furosemide  20 MG tablet Commonly known as: LASIX  TAKE THREE TABLETS BY MOUTH ONE TIME DAILY , TAKE AN EXTRA TABLET AS NEEDED FOR SWELLING What changed: See the new instructions.   magnesium  oxide 400 (240 Mg) MG tablet Commonly known as: MAG-OX TAKE ONE TABLET BY MOUTH ONE TIME DAILY   metoprolol  tartrate 50 MG tablet Commonly known as: LOPRESSOR  TAKE ONE AND ONE-HALF TABLETS BY MOUTH TWICE A DAY What changed:  how much to take how to take this when to take this   oxybutynin  10 MG 24 hr tablet Commonly known as: DITROPAN -XL Take 1 tablet (10 mg total) by mouth in the morning and at bedtime.   pantoprazole  40 MG tablet Commonly known as: PROTONIX  Take 1 tablet (40 mg total) by mouth 2 (two) times daily.   potassium chloride  SA 20 MEQ tablet Commonly known as: KLOR-CON  M Take 1 tablet (20 mEq total) by mouth once for 1 dose. TAKE ONE TABLET BY MOUTH DAILY - USE WHEN TAKING MORE THAN 20 MG OF  FUROSEMIDE  DAILY        Allergies[1]  Consultations: Gastroenterology   Procedures/Studies: CT ABDOMEN PELVIS W CONTRAST Result Date: 10/05/2024 CLINICAL DATA:  Abdominal pain, acute, nonlocalized EXAM: CT ABDOMEN AND PELVIS WITH CONTRAST TECHNIQUE: Multidetector CT imaging of the abdomen and pelvis was performed using the standard protocol following bolus administration of intravenous contrast. RADIATION DOSE REDUCTION: This exam was performed according to the departmental dose-optimization program which includes automated exposure control, adjustment of the mA and/or kV according to patient size and/or use of iterative reconstruction technique. CONTRAST:  75mL OMNIPAQUE  IOHEXOL  300 MG/ML  SOLN COMPARISON:  CT abdomen/pelvis dated 04/25/2024. FINDINGS: Evaluation is slightly limited by motion degradation. Lower chest: No acute abnormality.  Cardiomegaly. Hepatobiliary: Similar irregularity of the liver margins could reflect cirrhosis. No focal liver abnormality is seen. Status post cholecystectomy. No biliary dilatation. Pancreas: Unremarkable. No pancreatic ductal dilatation or surrounding inflammatory changes. Spleen: Normal in size without focal abnormality. Adrenals/Urinary Tract: Adrenal glands are unremarkable. No hydronephrosis or urolithiasis. 1.2 cm simple cyst  at the inferior pole of the left kidney, for which no follow-up imaging is recommended. Bladder is unremarkable. Stomach/Bowel: Stomach is underdistended limiting detailed evaluation. No obstruction. Small bowel is grossly unremarkable. Appendix appears normal. Descending and sigmoid colonic diverticulosis without evidence of acute diverticulitis. Vascular/Lymphatic: Nonaneurysmal abdominal aorta with atherosclerotic calcification. No pathologically enlarged lymph nodes identified in the field of view. Similar stable prominent bilateral inguinal lymph nodes not definitively enlarged by size criteria. Reproductive: Uterus and bilateral  adnexa are unremarkable. Other: Trace pelvic free fluid. No intraperitoneal free air. Similar small fat containing umbilical hernia. Musculoskeletal: No acute osseous abnormality. Multilevel degenerative changes of the thoracolumbar spine. Redemonstrated hemangioma in T10 vertebral body. IMPRESSION: 1. Trace pelvic free fluid of uncertain etiology. 2. Descending and sigmoid colonic diverticulosis without evidence of acute diverticulitis. 3. Similar irregularity of the liver margins could reflect cirrhosis. 4.  Aortic Atherosclerosis (ICD10-I70.0). Electronically Signed   By: Harrietta Sherry M.D.   On: 10/05/2024 14:06   DG Chest Portable 1 View Result Date: 10/05/2024 CLINICAL DATA:  Shortness of breath.  Disease.  Chest pain. EXAM: PORTABLE CHEST 1 VIEW COMPARISON:  08/14/2024 FINDINGS: Stable mild cardiomegaly. Both lungs are clear. Surgical clips noted in left axilla and chest wall. IMPRESSION: Mild cardiomegaly. No active lung disease. Electronically Signed   By: Norleen DELENA Kil M.D.   On: 10/05/2024 11:39   (Echo, Carotid, EGD, Colonoscopy, ERCP)    Subjective: No complaints  Discharge Exam: Vitals:   10/07/24 2000 10/08/24 0429  BP: (!) 144/77 135/76  Pulse: 98 84  Resp: 17 18  Temp: 98 F (36.7 C) 98.2 F (36.8 C)  SpO2: 95% 97%   Vitals:   10/07/24 1130 10/07/24 1345 10/07/24 2000 10/08/24 0429  BP: (!) 151/71 (!) 133/90 (!) 144/77 135/76  Pulse:  99 98 84  Resp: 16 18 17 18   Temp:  97.6 F (36.4 C) 98 F (36.7 C) 98.2 F (36.8 C)  TempSrc:  Oral  Oral  SpO2: 95% 100% 95% 97%  Weight:      Height:        General: Pt is alert, awake, not in acute distress Cardiovascular: RRR, S1/S2 +, no rubs, no gallops Respiratory: CTA bilaterally, no wheezing, no rhonchi Abdominal: Soft, NT, ND, bowel sounds + Extremities: no edema, no cyanosis    The results of significant diagnostics from this hospitalization (including imaging, microbiology, ancillary and laboratory) are  listed below for reference.     Microbiology: Recent Results (from the past 240 hours)  Resp panel by RT-PCR (RSV, Flu A&B, Covid) Anterior Nasal Swab     Status: None   Collection Time: 10/05/24 10:06 AM   Specimen: Anterior Nasal Swab  Result Value Ref Range Status   SARS Coronavirus 2 by RT PCR NEGATIVE NEGATIVE Final    Comment: (NOTE) SARS-CoV-2 target nucleic acids are NOT DETECTED.  The SARS-CoV-2 RNA is generally detectable in upper respiratory specimens during the acute phase of infection. The lowest concentration of SARS-CoV-2 viral copies this assay can detect is 138 copies/mL. A negative result does not preclude SARS-Cov-2 infection and should not be used as the sole basis for treatment or other patient management decisions. A negative result may occur with  improper specimen collection/handling, submission of specimen other than nasopharyngeal swab, presence of viral mutation(s) within the areas targeted by this assay, and inadequate number of viral copies(<138 copies/mL). A negative result must be combined with clinical observations, patient history, and epidemiological information. The expected result is Negative.  Fact Sheet for Patients:  bloggercourse.com  Fact Sheet for Healthcare Providers:  seriousbroker.it  This test is no t yet approved or cleared by the [place]  FDA and  has been authorized for detection and/or diagnosis of SARS-CoV-2 by FDA under an Emergency Use Authorization (EUA). This EUA will remain  in effect (meaning this test can be used) for the duration of the COVID-19 declaration under Section 564(b)(1) of the Act, 21 U.S.C.section 360bbb-3(b)(1), unless the authorization is terminated  or revoked sooner.       Influenza A by PCR NEGATIVE NEGATIVE Final   Influenza B by PCR NEGATIVE NEGATIVE Final    Comment: (NOTE) The Xpert Xpress SARS-CoV-2/FLU/RSV plus assay is intended as an  aid in the diagnosis of influenza from Nasopharyngeal swab specimens and should not be used as a sole basis for treatment. Nasal washings and aspirates are unacceptable for Xpert Xpress SARS-CoV-2/FLU/RSV testing.  Fact Sheet for Patients: bloggercourse.com  Fact Sheet for Healthcare Providers: seriousbroker.it  This test is not yet approved or cleared by the [place]  FDA and has been authorized for detection and/or diagnosis of SARS-CoV-2 by FDA under an Emergency Use Authorization (EUA). This EUA will remain in effect (meaning this test can be used) for the duration of the COVID-19 declaration under Section 564(b)(1) of the Act, 21 U.S.C. section 360bbb-3(b)(1), unless the authorization is terminated or revoked.     Resp Syncytial Virus by PCR NEGATIVE NEGATIVE Final    Comment: (NOTE) Fact Sheet for Patients: bloggercourse.com  Fact Sheet for Healthcare Providers: seriousbroker.it  This test is not yet approved or cleared by the [place]  FDA and has been authorized for detection and/or diagnosis of SARS-CoV-2 by FDA under an Emergency Use Authorization (EUA). This EUA will remain in effect (meaning this test can be used) for the duration of the COVID-19 declaration under Section 564(b)(1) of the Act, 21 U.S.C. section 360bbb-3(b)(1), unless the authorization is terminated or revoked.  Performed at [facility], [address] [place], [place] [address]      Labs: BNP (last 3 results) No results for input(s): BNP in the last 8760 hours. Basic Metabolic Panel: Recent Labs  Lab 10/05/24 1027 10/06/24 0856 10/07/24 0422 10/08/24 0441  NA 140 145 142 143  K 3.5 3.3* 4.0 3.3*  CL 99 104 104 105  CO2 28 28 26 26   GLUCOSE 111* 95 88 91  BUN 24* 20 19 18   CREATININE 1.32* 1.25* 1.17* 1.16*  CALCIUM 10.3 10.2 10.4* 10.5*  MG  --  2.3 2.4 2.2   PHOS  --   --  3.3 3.4   Liver Function Tests: Recent Labs  Lab 10/05/24 1027 10/06/24 0856 10/07/24 0422 10/08/24 0441  AST 27 26  --   --   ALT 20 19  --   --   ALKPHOS 51 56  --   --   BILITOT 1.0 1.5*  --   --   PROT 6.7 6.5  --   --   ALBUMIN 4.2 4.0 3.7 3.8   Recent Labs  Lab 10/05/24 1027  LIPASE 20   No results for input(s): AMMONIA in the last 168 hours. CBC: Recent Labs  Lab 10/05/24 1027 10/05/24 1634 10/06/24 0324 10/06/24 0856 10/06/24 1025 10/07/24 0422 10/08/24 0441  WBC 10.2  --   --  8.3  --  7.9 9.7  HGB 6.3*   < > 7.0* 8.3* 9.0* 7.7* 8.4*  HCT 21.9*   < >  22.9* 26.9* 30.0* 25.6* 28.5*  MCV 68.4*  --   --  73.9*  --  74.0* 75.0*  PLT 348  --   --  305  --  255 271   < > = values in this interval not displayed.   Cardiac Enzymes: No results for input(s): CKTOTAL, CKMB, CKMBINDEX, TROPONINI in the last 168 hours. BNP: Invalid input(s): POCBNP CBG: No results for input(s): GLUCAP in the last 168 hours. D-Dimer No results for input(s): DDIMER in the last 72 hours. Hgb A1c No results for input(s): HGBA1C in the last 72 hours. Lipid Profile No results for input(s): CHOL, HDL, LDLCALC, TRIG, CHOLHDL, LDLDIRECT in the last 72 hours. Thyroid function studies No results for input(s): TSH, T4TOTAL, T3FREE, THYROIDAB in the last 72 hours.  Invalid input(s): FREET3 Anemia work up Recent Labs    10/06/24 0856  VITAMINB12 1,177*  FOLATE 9.1  FERRITIN 8*  TIBC 477*  IRON  67  RETICCTPCT 1.3   Urinalysis    Component Value Date/Time   COLORURINE STRAW (A) 10/05/2024 1006   APPEARANCEUR CLEAR 10/05/2024 1006   LABSPEC 1.015 10/05/2024 1006   PHURINE 7.0 10/05/2024 1006   GLUCOSEU 250 (A) 10/05/2024 1006   HGBUR NEGATIVE 10/05/2024 1006   BILIRUBINUR NEGATIVE 10/05/2024 1006   BILIRUBINUR negative 03/23/2023 0951   KETONESUR NEGATIVE 10/05/2024 1006   PROTEINUR NEGATIVE 10/05/2024 1006    UROBILINOGEN 0.2 03/23/2023 0951   NITRITE NEGATIVE 10/05/2024 1006   LEUKOCYTESUR TRACE (A) 10/05/2024 1006   Sepsis Labs Recent Labs  Lab 10/05/24 1027 10/06/24 0856 10/07/24 0422 10/08/24 0441  WBC 10.2 8.3 7.9 9.7   Microbiology Recent Results (from the past 240 hours)  Resp panel by RT-PCR (RSV, Flu A&B, Covid) Anterior Nasal Swab     Status: None   Collection Time: 10/05/24 10:06 AM   Specimen: Anterior Nasal Swab  Result Value Ref Range Status   SARS Coronavirus 2 by RT PCR NEGATIVE NEGATIVE Final    Comment: (NOTE) SARS-CoV-2 target nucleic acids are NOT DETECTED.  The SARS-CoV-2 RNA is generally detectable in upper respiratory specimens during the acute phase of infection. The lowest concentration of SARS-CoV-2 viral copies this assay can detect is 138 copies/mL. A negative result does not preclude SARS-Cov-2 infection and should not be used as the sole basis for treatment or other patient management decisions. A negative result may occur with  improper specimen collection/handling, submission of specimen other than nasopharyngeal swab, presence of viral mutation(s) within the areas targeted by this assay, and inadequate number of viral copies(<138 copies/mL). A negative result must be combined with clinical observations, patient history, and epidemiological information. The expected result is Negative.  Fact Sheet for Patients:  bloggercourse.com  Fact Sheet for Healthcare Providers:  seriousbroker.it  This test is no t yet approved or cleared by the [place]  FDA and  has been authorized for detection and/or diagnosis of SARS-CoV-2 by FDA under an Emergency Use Authorization (EUA). This EUA will remain  in effect (meaning this test can be used) for the duration of the COVID-19 declaration under Section 564(b)(1) of the Act, 21 U.S.C.section 360bbb-3(b)(1), unless the authorization is terminated  or revoked  sooner.       Influenza A by PCR NEGATIVE NEGATIVE Final   Influenza B by PCR NEGATIVE NEGATIVE Final    Comment: (NOTE) The Xpert Xpress SARS-CoV-2/FLU/RSV plus assay is intended as an aid in the diagnosis of influenza from Nasopharyngeal swab specimens and should not be used as a  sole basis for treatment. Nasal washings and aspirates are unacceptable for Xpert Xpress SARS-CoV-2/FLU/RSV testing.  Fact Sheet for Patients: bloggercourse.com  Fact Sheet for Healthcare Providers: seriousbroker.it  This test is not yet approved or cleared by the [place]  FDA and has been authorized for detection and/or diagnosis of SARS-CoV-2 by FDA under an Emergency Use Authorization (EUA). This EUA will remain in effect (meaning this test can be used) for the duration of the COVID-19 declaration under Section 564(b)(1) of the Act, 21 U.S.C. section 360bbb-3(b)(1), unless the authorization is terminated or revoked.     Resp Syncytial Virus by PCR NEGATIVE NEGATIVE Final    Comment: (NOTE) Fact Sheet for Patients: bloggercourse.com  Fact Sheet for Healthcare Providers: seriousbroker.it  This test is not yet approved or cleared by the [place]  FDA and has been authorized for detection and/or diagnosis of SARS-CoV-2 by FDA under an Emergency Use Authorization (EUA). This EUA will remain in effect (meaning this test can be used) for the duration of the COVID-19 declaration under Section 564(b)(1) of the Act, 21 U.S.C. section 360bbb-3(b)(1), unless the authorization is terminated or revoked.  Performed at [facility], [address] [place], [place] [address]      Time coordinating discharge: Over 35 minutes  SIGNED:   Erle Odell Castor, MD  [facility] 10/08/2024, 10:18 AM Pager   If 7PM-7AM, please contact night-coverage [email or web address] Password  [email or web address]     [1]  Allergies Allergen Reactions   Bee Venom Swelling    Bee stung her on the inside of her gum. Lips and gums started swelling. No SOB.   Levaquin [Levofloxacin] Hives and Itching   Peanut-Containing Drug Products Other (See Comments)    Stomach pain and diarrhea

## 2024-10-08 NOTE — Progress Notes (Signed)
 Loretta Gates GASTROENTEROLOGY ROUNDING NOTE   Subjective: EGD completed yesterday and notable for duodenal AVM treated with APC, 1 inflamed gastric polyp resected with hot snare and clipped closed, gastritis (biopsied).  All pathology pending.  Did well overnight and no acute issues.  Tolerating full breakfast this morning without issue.  Very hopeful for discharge home today.  Daughter at bedside.   Objective: Vital signs in last 24 hours: Temp:  [97.1 F (36.2 C)-98.2 F (36.8 C)] 98.2 F (36.8 C) (12/31 0429) Pulse Rate:  [59-99] 84 (12/31 0429) Resp:  [13-26] 18 (12/31 0429) BP: (94-151)/(46-90) 135/76 (12/31 0429) SpO2:  [94 %-100 %] 97 % (12/31 0429) Last BM Date : 10/04/24 General: NAD, sitting upright and eating breakfast    Intake/Output from previous day: 12/30 0701 - 12/31 0700 In: 797.4 [I.V.:496.7; IV Piggyback:300.7] Out: -  Intake/Output this shift: No intake/output data recorded.   Lab Results: Recent Labs    10/06/24 0856 10/06/24 1025 10/07/24 0422 10/08/24 0441  WBC 8.3  --  7.9 9.7  HGB 8.3* 9.0* 7.7* 8.4*  PLT 305  --  255 271  MCV 73.9*  --  74.0* 75.0*   BMET Recent Labs    10/06/24 0856 10/07/24 0422 10/08/24 0441  NA 145 142 143  K 3.3* 4.0 3.3*  CL 104 104 105  CO2 28 26 26   GLUCOSE 95 88 91  BUN 20 19 18   CREATININE 1.25* 1.17* 1.16*  CALCIUM 10.2 10.4* 10.5*   LFT Recent Labs    10/05/24 1027 10/06/24 0856 10/07/24 0422 10/08/24 0441  PROT 6.7 6.5  --   --   ALBUMIN 4.2 4.0 3.7 3.8  AST 27 26  --   --   ALT 20 19  --   --   ALKPHOS 51 56  --   --   BILITOT 1.0 1.5*  --   --    PT/INR No results for input(s): INR in the last 72 hours.    Imaging/Other results: No results found.    Assessment and Plan:  1) Acute blood loss anemia 2) Duodenal AVM 3) Gastric polyps 4) Gastritis 5) Iron  deficiency anemia 6) Symptomatic anemia EGD completed on 12/30 and notable for single duodenal AVM treated with APC.   There were multiple gastric fundic gland polyps, but 1 polyp in the gastric body with inflammatory, friable mucosa so this was resected with hot snare and elected for clip closure.  Moderate antral gastritis (biopsied).  Good response to 2 unit RBC transfusion and also received IV iron  on this admission.  No further evidence of overt bleeding. - Repeat CBC check 7-10 days after hospital discharge.  This can be done with her PCP - Continue Protonix  40 mg p.o. twice daily x 4 weeks to promote mucosal healing of gastritis, then reduce to Protonix  40 mg daily.  After that can transition back to Prilosec 40 mg daily for continued reflux management - If concern for rebleeding or ongoing IDA in the future, plan for video capsule endoscopy for further small bowel interrogation +/- colonoscopy - Check iron  panel in 3 months - Will follow-up on pathology results when that has resulted  7) Paroxysmal A-fib 8) Chronic anticoagulation 9) History of CVA - Restarted on Eliquis  last evening and seems to be doing fine without e/o bleed - Planning on repeat CBC check next week as above  Inpatient GI service will sign off at this time.  Please do not hesitate to contact us  with additional questions or  concerns    Sandor LULLA Flatter, DO  10/08/2024, 8:24 AM [facility] Pager [phone]  A total of 50 minutes of time was spent on this encounter, including in depth chart review, independent review of results as outlined above, communicating results with the patient directly, face-to-face time with the patient, coordinating care, and ordering studies and medications as appropriate, and documentation.

## 2024-10-08 NOTE — Progress Notes (Signed)
 [facility] PROGRESS NOTE    Progress Note  Loretta Gates  FMW:[id number] DOB: 11-07-1935 DOA: 10/05/2024 PCP: [name], MD     Brief Narrative:   Loretta Gates is an 88 y.o. female past medical history of breast cancer status postmastectomy, HFpEF, chronic atrial fibrillation on Eliquis , left CVA, left ICA stenosis status post stenting, history of GI bleed and iron  deficiency anemia presents with GI bleed nausea vomiting diarrhea and shortness of breath that started 4 days prior to admission.  In the ED was found to have a hemoglobin of 6.3 Hemoccult positive CT scan of the abdomen pelvis showed no acute findings she started empirically on IV Protonix , transfuse 2 unit of packed red blood cells and GI was consulted   Assessment/Plan:   Acute on chronic blood loss anemia possibly due to gastritis/acute blood loss anemia: She was taken off Brilinta  about 2 weeks ago last dose of Eliquis  was on 10/05/2024. Anemia panel shows severe iron  deficiency anemia.  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 GI recommended to continue PPI twice a day and to resume Plavix. Will allow diet.  Chronic HFpEF: Jardiance  and Lasix  have been resumed. Continue strict I's and O's and daily weights monitor electrolytes. Her potassium is low replete orally recheck in the morning.  Hypokalemia: Replete orally recheck in the morning.  Chronic atrial fibrillation: Rate controlled metoprolol . GI agreed okay to resume Eliquis . Monitor for any signs of overt bleeding.  Acute kidney injury: With a baseline creatinine 0.8, started on IV fluids and 2 units of packed red blood cells her hemoglobin is improving slowly.  History of left CVA: Secondary to left ICA occlusion with embolization, she is status post mechanical thrombectomy of her  left MCA. Aspirin  on hold She is currently on Eliquis  no signs of overt bleeding. PT OT recommended no home health PT.  Possible liver cirrhosis: Follow-up with GI as an outpatient.  History of left breast cancer status postmastectomy: Noted.  Overactive bladder: Continue oxybutynin .  Chronic constipation: MiraLAX  as needed   DVT prophylaxis: eliquis  Family Communication:daughter Status is: Inpatient Remains inpatient appropriate because: Acute GI bleed    Code Status:     Code Status Orders  (From admission, onward)           Start     Ordered   10/05/24 1912  Full code  Continuous       Question:  By:  Answer:  Consent: discussion documented in EHR   10/05/24 1911           Code Status History     Date Active Date Inactive Code Status Order ID Comments User Context   02/13/2023 1644 02/25/2023 1342 Full Code [id number]  Pegge Toribio JINNY DEVONNA Inpatient   02/13/2023 1643 02/13/2023 1644 Full Code [id number]  Pegge Toribio JINNY DEVONNA Inpatient   02/06/2023 2028 02/13/2023 1639 Full Code [id number]  Jerrie Lola CROME, MD ED   10/20/2022 2259 10/23/2022 1530 Full Code [id number]  Shona Terry SAILOR, DO Inpatient      Advance Directive Documentation    Flowsheet Row Most Recent Value  Type of Advance Directive Living will  Pre-existing out of facility DNR order (yellow form or pink MOST form) --  MOST Form in Place? --      IV Access:   Peripheral IV   Procedures and diagnostic  studies:   No results found.   Medical Consultants:   None.   Subjective:    Loretta Gates no complaints  Objective:    Vitals:   10/07/24 1130 10/07/24 1345 10/07/24 2000 10/08/24 0429  BP: (!) 151/71 (!) 133/90 (!) 144/77 135/76  Pulse:  99 98 84  Resp: 16 18 17 18   Temp:  97.6 F (36.4 C) 98 F (36.7 C) 98.2 F (36.8 C)  TempSrc:  Oral  Oral  SpO2: 95% 100% 95% 97%  Weight:      Height:       SpO2: 97 % O2 Flow Rate (L/min): 2 L/min   Intake/Output  Summary (Last 24 hours) at 10/08/2024 0956 Last data filed at 10/08/2024 0914 Gross per 24 hour  Intake 917.36 ml  Output --  Net 917.36 ml   Filed Weights   10/05/24 1855  Weight: 72.8 kg    Exam: General exam: In no acute distress. Respiratory system: Good air movement and clear to auscultation. Cardiovascular system: S1 & S2 heard, RRR. No JVD. Gastrointestinal system: Abdomen is nondistended, soft and nontender.  Extremities: No pedal edema. Skin: No rashes, lesions or ulcers Psychiatry: Judgement and insight appear normal. Mood & affect appropriate.    Data Reviewed:    Labs: Basic Metabolic Panel: Recent Labs  Lab 10/05/24 1027 10/06/24 0856 10/07/24 0422 10/08/24 0441  NA 140 145 142 143  K 3.5 3.3* 4.0 3.3*  CL 99 104 104 105  CO2 28 28 26 26   GLUCOSE 111* 95 88 91  BUN 24* 20 19 18   CREATININE 1.32* 1.25* 1.17* 1.16*  CALCIUM 10.3 10.2 10.4* 10.5*  MG  --  2.3 2.4 2.2  PHOS  --   --  3.3 3.4   GFR Estimated Creatinine Clearance: 29.5 mL/min (A) (by C-G formula based on SCr of 1.16 mg/dL (H)). Liver Function Tests: Recent Labs  Lab 10/05/24 1027 10/06/24 0856 10/07/24 0422 10/08/24 0441  AST 27 26  --   --   ALT 20 19  --   --   ALKPHOS 51 56  --   --   BILITOT 1.0 1.5*  --   --   PROT 6.7 6.5  --   --   ALBUMIN 4.2 4.0 3.7 3.8   Recent Labs  Lab 10/05/24 1027  LIPASE 20   No results for input(s): AMMONIA in the last 168 hours. Coagulation profile No results for input(s): INR, PROTIME in the last 168 hours. COVID-19 Labs  Recent Labs    10/06/24 0856  FERRITIN 8*    Lab Results  Component Value Date   SARSCOV2NAA NEGATIVE 10/05/2024   SARSCOV2NAA NEGATIVE 10/20/2022    CBC: Recent Labs  Lab 10/05/24 1027 10/05/24 1634 10/06/24 0324 10/06/24 0856 10/06/24 1025 10/07/24 0422 10/08/24 0441  WBC 10.2  --   --  8.3  --  7.9 9.7  HGB 6.3*   < > 7.0* 8.3* 9.0* 7.7* 8.4*  HCT 21.9*   < > 22.9* 26.9* 30.0* 25.6* 28.5*   MCV 68.4*  --   --  73.9*  --  74.0* 75.0*  PLT 348  --   --  305  --  255 271   < > = values in this interval not displayed.   Cardiac Enzymes: No results for input(s): CKTOTAL, CKMB, CKMBINDEX, TROPONINI in the last 168 hours. BNP (last 3 results) No results for input(s): PROBNP in the last 8760 hours. CBG: No results for input(s): GLUCAP  in the last 168 hours. D-Dimer: No results for input(s): DDIMER in the last 72 hours. Hgb A1c: No results for input(s): HGBA1C in the last 72 hours. Lipid Profile: No results for input(s): CHOL, HDL, LDLCALC, TRIG, CHOLHDL, LDLDIRECT in the last 72 hours. Thyroid function studies: No results for input(s): TSH, T4TOTAL, T3FREE, THYROIDAB in the last 72 hours.  Invalid input(s): FREET3 Anemia work up: Recent Labs    10/06/24 0856  VITAMINB12 1,177*  FOLATE 9.1  FERRITIN 8*  TIBC 477*  IRON  67  RETICCTPCT 1.3   Sepsis Labs: Recent Labs  Lab 10/05/24 1027 10/06/24 0856 10/07/24 0422 10/08/24 0441  WBC 10.2 8.3 7.9 9.7   Microbiology Recent Results (from the past 240 hours)  Resp panel by RT-PCR (RSV, Flu A&B, Covid) Anterior Nasal Swab     Status: None   Collection Time: 10/05/24 10:06 AM   Specimen: Anterior Nasal Swab  Result Value Ref Range Status   SARS Coronavirus 2 by RT PCR NEGATIVE NEGATIVE Final    Comment: (NOTE) SARS-CoV-2 target nucleic acids are NOT DETECTED.  The SARS-CoV-2 RNA is generally detectable in upper respiratory specimens during the acute phase of infection. The lowest concentration of SARS-CoV-2 viral copies this assay can detect is 138 copies/mL. A negative result does not preclude SARS-Cov-2 infection and should not be used as the sole basis for treatment or other patient management decisions. A negative result may occur with  improper specimen collection/handling, submission of specimen other than nasopharyngeal swab, presence of viral mutation(s) within  the areas targeted by this assay, and inadequate number of viral copies(<138 copies/mL). A negative result must be combined with clinical observations, patient history, and epidemiological information. The expected result is Negative.  Fact Sheet for Patients:  bloggercourse.com  Fact Sheet for Healthcare Providers:  seriousbroker.it  This test is no t yet approved or cleared by the [place]  FDA and  has been authorized for detection and/or diagnosis of SARS-CoV-2 by FDA under an Emergency Use Authorization (EUA). This EUA will remain  in effect (meaning this test can be used) for the duration of the COVID-19 declaration under Section 564(b)(1) of the Act, 21 U.S.C.section 360bbb-3(b)(1), unless the authorization is terminated  or revoked sooner.       Influenza A by PCR NEGATIVE NEGATIVE Final   Influenza B by PCR NEGATIVE NEGATIVE Final    Comment: (NOTE) The Xpert Xpress SARS-CoV-2/FLU/RSV plus assay is intended as an aid in the diagnosis of influenza from Nasopharyngeal swab specimens and should not be used as a sole basis for treatment. Nasal washings and aspirates are unacceptable for Xpert Xpress SARS-CoV-2/FLU/RSV testing.  Fact Sheet for Patients: bloggercourse.com  Fact Sheet for Healthcare Providers: seriousbroker.it  This test is not yet approved or cleared by the [place]  FDA and has been authorized for detection and/or diagnosis of SARS-CoV-2 by FDA under an Emergency Use Authorization (EUA). This EUA will remain in effect (meaning this test can be used) for the duration of the COVID-19 declaration under Section 564(b)(1) of the Act, 21 U.S.C. section 360bbb-3(b)(1), unless the authorization is terminated or revoked.     Resp Syncytial Virus by PCR NEGATIVE NEGATIVE Final    Comment: (NOTE) Fact Sheet for  Patients: bloggercourse.com  Fact Sheet for Healthcare Providers: seriousbroker.it  This test is not yet approved or cleared by the [place]  FDA and has been authorized for detection and/or diagnosis of SARS-CoV-2 by FDA under an Emergency Use Authorization (EUA). This EUA will remain in effect (  meaning this test can be used) for the duration of the COVID-19 declaration under Section 564(b)(1) of the Act, 21 U.S.C. section 360bbb-3(b)(1), unless the authorization is terminated or revoked.  Performed at [facility], [address] [place], [place] [address]      Medications:    sodium chloride    Intravenous Once   sodium chloride    Intravenous Once   apixaban   5 mg Oral BID   empagliflozin   10 mg Oral Daily   furosemide   60 mg Oral Daily   metoprolol  tartrate  75 mg Oral BID   oxybutynin   10 mg Oral QHS   pantoprazole   40 mg Oral BID   Continuous Infusions:    LOS: 1 day   Erle Odell Castor  [facility]  10/08/2024, 9:56 AM

## 2024-10-09 ENCOUNTER — Telehealth: Payer: Self-pay

## 2024-10-09 ENCOUNTER — Encounter (HOSPITAL_COMMUNITY): Payer: Self-pay | Admitting: Gastroenterology

## 2024-10-09 LAB — SURGICAL PATHOLOGY

## 2024-10-09 NOTE — Telephone Encounter (Signed)
 Auth Submission: NO AUTH NEEDED Site of care: Site of care: [facility] Payer: [employer] medicare Medication & CPT/J Code(s) submitted: Venofer  (Iron  Sucrose) J1756 Diagnosis Code:  Route of submission (phone, fax, portal):  Phone # Fax # Auth type: Buy/Bill PB Units/visits requested: 300mg  x 2 doses Reference number:  Approval from: 10/09/24 to 02/05/25

## 2024-10-10 ENCOUNTER — Telehealth: Payer: Self-pay

## 2024-10-10 ENCOUNTER — Encounter: Payer: Self-pay | Admitting: Family Medicine

## 2024-10-10 ENCOUNTER — Ambulatory Visit: Payer: Self-pay | Admitting: Gastroenterology

## 2024-10-10 NOTE — Telephone Encounter (Signed)
 Copied from CRM #[id number]. Topic: Clinical - Request for Lab/Test Order >> Oct 10, 2024  9:35 AM Hadassah PARAS wrote: Reason for CRM: Pt's daughter Aleck called in to schedule a blood panel app as pt was hospitalized 12/31. Advised an order has not been made. Advise a req will be put in, once in, we will cb to schedule. Please advise on [phone]

## 2024-10-11 NOTE — Progress Notes (Deleted)
 [facility] [address], [address] [place], [place] [address] [phone]  Date:  10/15/2024   Name:  Loretta Gates   DOB:  12/31/1935   MRN:  [id number]  PCP:  [name], MD    Chief Complaint: No chief complaint on file.   History of Present Illness:  Loretta Gates is a 89 y.o. very pleasant female patient who presents with the following:  Patient seen today for follow-up after recent hospitalization I saw her most recently in October History of heart failure, atrial fibrillation on Eliquis  and metoprolol .  She did suffer a CVA 02/2023 -good recovery  She was admitted 12/28 through 12/31 with a GI bleed and severe anemia Brief/Interim Summary: 89 y.o. female past medical history of breast cancer status postmastectomy, HFpEF, chronic atrial fibrillation on Eliquis , left CVA, left ICA stenosis status post stenting, history of GI bleed and iron  deficiency anemia presents with GI bleed nausea vomiting diarrhea and shortness of breath that started 4 days prior to admission.  In the ED was found to have a hemoglobin of 6.3 Hemoccult positive CT scan of the abdomen pelvis showed no acute findings she started empirically on IV Protonix , transfuse 2 unit of packed red blood cells and GI was consulted  She had EGD on December 30-GI recommended that she continue twice daily Protonix  and okay to resume Eliquis   Endoscopy report: - Multiple benign fundic gland polyps. - One gastric polyp that was moderately erythematous and somewhat friable. Resected with hot snare and polypectomy site was clipped closed with a single clip. - Moderate antral, non- ulcer gastritis. Biopsied. - A single angioectasia in the duodenum. Treated with argon plasma coagulation ( APC) . - Normal mucosa was found in the duodenal bulb, in the first portion of the duodenum, in the second portion of the duodenum and in the third portion of the duodenum.  We need  to recheck her CBC today as well as BMP Hemoglobin on admission as low as 6.0, up to 8.4 on day of discharge  Discussed the use of AI scribe software for clinical note transcription with the patient, who gave verbal consent to proceed.  History of Present Illness      Patient Active Problem List   Diagnosis Date Noted   Generalized weakness 10/07/2024   Gastritis and gastroduodenitis 10/07/2024   Gastric polyp 10/07/2024   Benign fundic gland polyps of stomach 10/07/2024   AVM (arteriovenous malformation) of small bowel, acquired 10/07/2024   AKI (acute kidney injury) 10/06/2024   Symptomatic anemia 10/06/2024   Chronic anticoagulation 10/06/2024   Microcytic anemia 10/06/2024   GIB (gastrointestinal bleeding) 10/05/2024   Chronic heart failure with preserved ejection fraction (HFpEF) (HCC) 10/05/2024   Posterior vitreous detachment of both eyes 12/27/2023   Myopia of both eyes with astigmatism and presbyopia 12/20/2023   Dermatochalasis of both upper eyelids 12/20/2023   Difficulty coping 02/19/2023   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   Middle cerebral artery embolism, left 02/07/2023   ICAO (internal carotid artery occlusion), left 02/06/2023   Chest pain 12/08/2022   Class 3 obesity (HCC) 10/21/2022   Acute on chronic diastolic CHF (congestive heart failure) (HCC) 10/20/2022   IDA (iron  deficiency anemia) 01/13/2022   Atrial fibrillation, chronic (HCC) 01/05/2022   Breast cancer (HCC) 01/05/2022   Primary hyperparathyroidism 01/05/2022   Essential hypertension 01/05/2022   Age-related nuclear cataract of both eyes 12/11/2018  Nuclear sclerotic cataract of both eyes 12/11/2018   Abnormal INR 01/29/2018   Long term (current) use of anticoagulants 01/29/2018   Encounter for therapeutic drug monitoring 01/29/2018   Vitamin D  deficiency 05/03/2017   S/P laparoscopic cholecystectomy 02/26/2017   Calculus of gallbladder without cholecystitis without  obstruction 02/23/2017   Diarrhea of presumed infectious origin 02/23/2017   Hyperglycemia 02/23/2017   Right upper quadrant abdominal pain 02/23/2017   Lower extremity edema 01/18/2016   Age-related osteoporosis without current pathological fracture 01/17/2016   Diverticulosis of large intestine without hemorrhage 01/17/2016   GERD (gastroesophageal reflux disease) 01/17/2016   History of left breast cancer 01/17/2016   Multinodular goiter 01/17/2016   OAB (overactive bladder) 01/17/2016   History of cancer chemotherapy 03/06/2015   History of left mastectomy 03/06/2015   Malignant neoplasm of overlapping sites of left female breast (HCC) 03/06/2015    Past Medical History:  Diagnosis Date   Anemia    Bladder cancer [facility])    urologist--- dr pace   Chronic diastolic (congestive) heart failure (HCC)    followed by dr pietro   Chronic venous insufficiency    Edema of both lower extremities    per pt wears compression hose   GERD (gastroesophageal reflux disease)    History of cancer chemotherapy    completed 2008 for left breast cancer   History of head and neck radiation 1946   per pt approx 1946 or 1947  (age 39) due to 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 patel;   2006  s/p left thyroidectomy w/ right inferior parathyoidectomy  (per path speciman marked parathyroid but only thyroid tissue)   Hypertension    IDA (iron  deficiency anemia)    hematology/ oncologist--- dr ennever/ sarah carter NP;  treated w/ IV iron  infusions   Multinodular thyroid    Neuropathy    mild  hands/ feet, uses cane   Nocturia more than twice per night    Nocturnal leg cramps    OA (osteoarthritis)    knees   OAB (overactive bladder)    Osteoporosis    PAF (paroxysmal atrial fibrillation) [facility])    cardiologist--- dr pietro   PONV (postoperative nausea and vomiting)    Vitamin D  deficiency    Wears glasses     Past Surgical History:  Procedure Laterality Date   BIOPSY OF SKIN SUBCUTANEOUS TISSUE AND/OR MUCOUS MEMBRANE  10/07/2024   Procedure: BIOPSY, SKIN, SUBCUTANEOUS TISSUE, OR MUCOUS MEMBRANE;  Surgeon: San Sandor GAILS, DO;  Location: [facility];  Service: Gastroenterology;;   BREAST LUMPECTOMY WITH RADIOACTIVE SEED LOCALIZATION Right 10/25/2021   Procedure: RIGHT BREAST LUMPECTOMY WITH RADIOACTIVE SEED LOCALIZATION;  Surgeon: Vanderbilt Ned, MD;  Location: [facility];  Service: General;  Laterality: Right;   CARDIOVERSION N/A 01/01/2023   Procedure: CARDIOVERSION;  Surgeon: Pietro Redell RAMAN, MD;  Location: [facility];  Service: Cardiovascular;  Laterality: N/A;   CHOLECYSTECTOMY, LAPAROSCOPIC  02/25/2017   @[facility]    COLONOSCOPY WITH ESOPHAGOGASTRODUODENOSCOPY (EGD)  2022   ESOPHAGOGASTRODUODENOSCOPY N/A 10/07/2024   Procedure: EGD (ESOPHAGOGASTRODUODENOSCOPY);  Surgeon: San Sandor GAILS, DO;  Location: [facility];  Service: Gastroenterology;  Laterality: N/A;   HEMOSTASIS CLIP PLACEMENT  10/07/2024   Procedure: CONTROL OF HEMORRHAGE, GI TRACT, ENDOSCOPIC, BY CLIPPING OR OVERSEWING;  Surgeon: San Sandor GAILS, DO;  Location: [facility];  Service: Gastroenterology;;   HOT HEMOSTASIS N/A 10/07/2024   Procedure: EGD, WITH ARGON PLASMA COAGULATION;  Surgeon: San Sandor GAILS, DO;  Location: [facility];  Service: Gastroenterology;  Laterality: N/A;   IR ANGIO INTRA EXTRACRAN SEL COM CAROTID INNOMINATE UNI R MOD SED  02/07/2023   IR ANGIO VERTEBRAL SEL SUBCLAVIAN INNOMINATE UNI L MOD SED  02/07/2023   IR CT HEAD LTD  02/07/2023   IR INTRAVSC  STENT CERV CAROTID W/O EMB-PROT MOD SED INC ANGIO  02/07/2023   IR PERCUTANEOUS ART THROMBECTOMY/INFUSION INTRACRANIAL INC DIAG ANGIO  02/07/2023   IR RADIOLOGIST EVAL & MGMT  03/10/2023   MASTECTOMY WITH AXILLARY LYMPH NODE DISSECTION Left 03/05/2007   @ [facility]   OVARIAN CYST SURGERY     age 9;   abdominal   POLYPECTOMY  10/07/2024   Procedure: POLYPECTOMY, INTESTINE;  Surgeon: San Sandor GAILS, DO;  Location: [facility];  Service: Gastroenterology;;   RADIOLOGY WITH ANESTHESIA N/A 02/06/2023   Procedure: IR WITH ANESTHESIA;  Surgeon: Radiologist, Medication, MD;  Location: [facility] OR;  Service: Radiology;  Laterality: N/A;   THYROIDECTOMY, PARTIAL Left 2006   left lobectomy and right infertior parathyroidectomy   TONSILLECTOMY     child    Social History[1]  Family History  Problem Relation Age of Onset   Hypertension Mother    Hypertension Father    Hypertension Daughter    Hypertension Son    Sudden death Neg Hx     Allergies[2]  Medication list has been reviewed and updated.  Medications Ordered Prior to Encounter[3]  Review of Systems:  As per HPI- otherwise negative.   Physical Examination: There were no vitals filed for this visit. There were no vitals filed for this visit. There is no height or weight on file to calculate BMI. Ideal Body Weight:    GEN: no acute distress. HEENT: Atraumatic, Normocephalic.  Ears and Nose: No external deformity. CV: RRR, No M/G/R. No JVD. No thrill. No extra heart sounds. PULM: CTA B, no wheezes, crackles, rhonchi. No retractions. No resp. distress. No accessory muscle use. ABD: S, NT, ND, +BS. No rebound. No HSM. EXTR: No c/c/e PSYCH: Normally interactive. Conversant.    Assessment and Plan: No diagnosis found.  Assessment & Plan   Signed Harlene Schroeder, MD    [1]  Social History Tobacco Use   Smoking status: Former    Current packs/day: 0.00    Types: Cigarettes    Quit date: 1969    Years since quitting: 57.0    Smokeless tobacco: Never  Vaping Use   Vaping status: Never Used  Substance Use Topics   Alcohol use: Never   Drug use: Never  [2]  Allergies Allergen Reactions   Bee Venom Swelling    Bee stung her on the inside of her gum. Lips and gums started swelling. No SOB.   Levaquin [Levofloxacin] Hives and Itching   Peanut-Containing Drug Products Other (See Comments)    Stomach pain and diarrhea   [3]  Current Outpatient Medications on File Prior to Visit  Medication Sig Dispense Refill   acetaminophen  (TYLENOL ) 325 MG tablet Take 2 tablets (650 mg total) by mouth every 4 (four) hours as needed for mild pain (or temp > 37.5 C (99.5 F)).  apixaban  (ELIQUIS ) 5 MG TABS tablet TAKE ONE TABLET BY MOUTH TWICE A DAY 180 tablet 3   [MAR Hold] aspirin  EC 81 MG tablet Take 1 tablet (81 mg total) by mouth daily. Swallow whole.     benzonatate  (TESSALON ) 100 MG capsule Take 1 capsule (100 mg total) by mouth 2 (two) times daily as needed for cough. (Patient not taking: Reported on 10/06/2024) 20 capsule 0   cinacalcet  (SENSIPAR ) 30 MG tablet Take 1 tablet (30 mg total) by mouth daily with breakfast. 30 tablet 0   empagliflozin  (JARDIANCE ) 10 MG TABS tablet Take 1 tablet (10 mg total) by mouth daily. 90 tablet 3   ergocalciferol  (VITAMIN D2) 1.25 MG (50000 UT) capsule Take 50,000 Units by mouth every 14 (fourteen) days. Every other saturday     furosemide  (LASIX ) 20 MG tablet TAKE THREE TABLETS BY MOUTH ONE TIME DAILY , TAKE AN EXTRA TABLET AS NEEDED FOR SWELLING 270 tablet 0   magnesium  oxide (MAG-OX) 400 (240 Mg) MG tablet TAKE ONE TABLET BY MOUTH ONE TIME DAILY 90 tablet 0   metoprolol  tartrate (LOPRESSOR ) 50 MG tablet TAKE ONE AND ONE-HALF TABLETS BY MOUTH TWICE A DAY (Patient taking differently: Take 75 mg by mouth 2 (two) times daily. TAKE ONE AND ONE-HALF TABLETS BY MOUTH TWICE A DAY) 270 tablet 3   oxybutynin  (DITROPAN -XL) 10 MG 24 hr tablet Take 1 tablet (10 mg total) by mouth in the morning  and at bedtime. 180 tablet 3   pantoprazole  (PROTONIX ) 40 MG tablet Take 1 tablet (40 mg total) by mouth 2 (two) times daily. 60 tablet 2   potassium chloride  SA (KLOR-CON  M) 20 MEQ tablet Take 1 tablet (20 mEq total) by mouth once for 1 dose. TAKE ONE TABLET BY MOUTH DAILY - USE WHEN TAKING MORE THAN 20 MG OF FUROSEMIDE  DAILY 90 tablet 3   No current facility-administered medications on file prior to visit.   "

## 2024-10-13 ENCOUNTER — Telehealth: Payer: Self-pay

## 2024-10-13 NOTE — Telephone Encounter (Signed)
 Pt had a lab appointment 10/11/2024.

## 2024-10-13 NOTE — Transitions of Care (Post Inpatient/ED Visit) (Unsigned)
" ° °  10/13/2024  Name: Loretta Gates MRN: [id number] DOB: 06-07-1936  Today's TOC FU Call Status: Today's TOC FU Call Status:: Unsuccessful Call (1st Attempt) Unsuccessful Call (1st Attempt) Date: 10/13/24  Attempted to reach the patient regarding the most recent Inpatient/ED visit.  Follow Up Plan: Additional outreach attempts will be made to reach the patient to complete the Transitions of Care (Post Inpatient/ED visit) call.   Signature Julian Lemmings, LPN [facility] Nurse Health Advisor Direct Dial [phone]  "

## 2024-10-14 NOTE — Transitions of Care (Post Inpatient/ED Visit) (Unsigned)
" ° °  10/14/2024  Name: Loretta Gates MRN: [id number] DOB: [birth year]  Today's TOC FU Call Status: Today's TOC FU Call Status:: Unsuccessful Call (2nd Attempt) Unsuccessful Call (1st Attempt) Date: 10/13/24 Unsuccessful Call (2nd Attempt) Date: 10/14/24  Attempted to reach the patient regarding the most recent Inpatient/ED visit.  Follow Up Plan: Additional outreach attempts will be made to reach the patient to complete the Transitions of Care (Post Inpatient/ED visit) call.   Signature Nikki M,CMA "

## 2024-10-15 ENCOUNTER — Ambulatory Visit: Admitting: Family Medicine

## 2024-10-15 DIAGNOSIS — K922 Gastrointestinal hemorrhage, unspecified: Secondary | ICD-10-CM

## 2024-10-15 NOTE — Transitions of Care (Post Inpatient/ED Visit) (Signed)
" ° °  10/15/2024  Name: Loretta Gates MRN: [id number] DOB: [birth year]  Today's TOC FU Call Status: Today's TOC FU Call Status:: Unsuccessful Call (3rd Attempt) Unsuccessful Call (1st Attempt) Date: 10/13/24 Unsuccessful Call (2nd Attempt) Date: 10/14/24 Unsuccessful Call (3rd Attempt) Date: 10/15/24  Attempted to reach the patient regarding the most recent Inpatient/ED visit.  Follow Up Plan: No further outreach attempts will be made at this time. We have been unable to contact the patient.  Signature Nikki M,CMA "

## 2024-10-16 ENCOUNTER — Encounter: Payer: Self-pay | Admitting: Family Medicine

## 2024-10-16 ENCOUNTER — Ambulatory Visit: Payer: Self-pay | Admitting: Family Medicine

## 2024-10-16 ENCOUNTER — Other Ambulatory Visit (INDEPENDENT_AMBULATORY_CARE_PROVIDER_SITE_OTHER)

## 2024-10-16 DIAGNOSIS — I10 Essential (primary) hypertension: Secondary | ICD-10-CM

## 2024-10-16 DIAGNOSIS — D509 Iron deficiency anemia, unspecified: Secondary | ICD-10-CM | POA: Diagnosis not present

## 2024-10-16 DIAGNOSIS — I482 Chronic atrial fibrillation, unspecified: Secondary | ICD-10-CM

## 2024-10-16 LAB — FERRITIN: Ferritin: 42.9 ng/mL (ref 10.0–291.0)

## 2024-10-17 ENCOUNTER — Other Ambulatory Visit

## 2024-10-17 DIAGNOSIS — I10 Essential (primary) hypertension: Secondary | ICD-10-CM

## 2024-10-17 NOTE — Addendum Note (Signed)
 Addended by: DERONDA LUKE SAUNDERS on: 10/17/2024 09:53 AM   Modules accepted: Orders

## 2024-10-17 NOTE — Addendum Note (Signed)
 Addended by: DERONDA LUKE SAUNDERS on: 10/17/2024 09:46 AM   Modules accepted: Orders

## 2024-10-17 NOTE — Addendum Note (Signed)
 Addended by: ORVIN HARLENE HERO on: 10/17/2024 11:43 AM   Modules accepted: Orders

## 2024-10-17 NOTE — Telephone Encounter (Signed)
 Lab is unable to add CBC to collected blood.  LM and MyChart message for patient to come in for lab visit only.

## 2024-10-18 ENCOUNTER — Encounter: Payer: Self-pay | Admitting: Family Medicine

## 2024-10-18 LAB — CBC
HCT: 31.6 % — ABNORMAL LOW (ref 35.9–46.0)
Hemoglobin: 9.1 g/dL — ABNORMAL LOW (ref 11.7–15.5)
MCH: 22.9 pg — ABNORMAL LOW (ref 27.0–33.0)
MCHC: 28.8 g/dL — ABNORMAL LOW (ref 31.6–35.4)
MCV: 79.6 fL — ABNORMAL LOW (ref 81.4–101.7)
MPV: 11.2 fL (ref 7.5–12.5)
Platelets: 291 Thousand/uL (ref 140–400)
RBC: 3.97 Million/uL (ref 3.80–5.10)
RDW: 22.3 % — ABNORMAL HIGH (ref 11.0–15.0)
WBC: 7 Thousand/uL (ref 3.8–10.8)

## 2024-10-18 LAB — BASIC METABOLIC PANEL WITH GFR
BUN/Creatinine Ratio: 16 (calc) (ref 6–22)
BUN: 23 mg/dL (ref 7–25)
CO2: 33 mmol/L — ABNORMAL HIGH (ref 20–32)
Calcium: 10 mg/dL (ref 8.6–10.4)
Chloride: 102 mmol/L (ref 98–110)
Creat: 1.45 mg/dL — ABNORMAL HIGH (ref 0.60–0.95)
Glucose, Bld: 108 mg/dL — ABNORMAL HIGH (ref 65–99)
Potassium: 3.5 mmol/L (ref 3.5–5.3)
Sodium: 143 mmol/L (ref 135–146)
eGFR: 35 mL/min/1.73m2 — ABNORMAL LOW

## 2024-10-25 NOTE — Progress Notes (Unsigned)
 [facility] [address] [place], [place] [address] [phone]  Date:  10/27/2024   Name:  Loretta Gates   DOB:  1935-11-07   MRN:  [id number]  PCP:  [name], MD    Chief Complaint: No chief complaint on file.   History of Present Illness:  Loretta Gates is a 89 y.o. very pleasant female patient who presents with the following:  Patient seen today for follow-up of anemia.  I saw her most recently in October History of heart failure, atrial fibrillation on Eliquis  and metoprolol , breast cancer, bladder cancer.  She did suffer a CVA 02/2023   She was admitted to the hospital at the end of December-12/28 through 12/31 with an acute GI bleed Acute on chronic blood loss anemia possibly due to gastritis and AVM/acute blood loss anemia. She was taken off Brilinta  about 2 weeks ago her last dose of Eliquis  was on 28 December. Anemia panel shows severe iron  deficiency anemia, she status post IV iron  and 2 units of packed red blood cells her hemoglobin admission was 6 posttransfusion 08. GI was consulted perform an EGD on 30 December at that showed a gastric polyp erythematous and antral friable none ulcer gastritis, single AVM nonbleeding in the duodenum treated with APC. GI recommended to continue Protonix  twice a day and okay to resume Eliquis . She was able to tolerate her diet.   We had scheduled a visit last week but the patient had to reschedule-however she did get blood work done on January 9. Discharge hemoglobin 8.4, up to 9.1 on January 9 Creatinine however has bumped up.  Her baseline is typically been less than 1.  Has stayed greater than 1 since her admission at the end of December; highest level seen so far on January 9 at 1.45  Discussed the use of AI scribe software for clinical note transcription with the patient, who gave verbal consent to proceed.  History of Present Illness     Patient  Active Problem List   Diagnosis Date Noted   Generalized weakness 10/07/2024   Gastritis and gastroduodenitis 10/07/2024   Gastric polyp 10/07/2024   Benign fundic gland polyps of stomach 10/07/2024   AVM (arteriovenous malformation) of small bowel, acquired 10/07/2024   AKI (acute kidney injury) 10/06/2024   Symptomatic anemia 10/06/2024   Chronic anticoagulation 10/06/2024   Microcytic anemia 10/06/2024   GIB (gastrointestinal bleeding) 10/05/2024   Chronic heart failure with preserved ejection fraction (HFpEF) (HCC) 10/05/2024   Posterior vitreous detachment of both eyes 12/27/2023   Myopia of both eyes with astigmatism and presbyopia 12/20/2023   Dermatochalasis of both upper eyelids 12/20/2023   Difficulty coping 02/19/2023   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   Middle cerebral artery embolism, left 02/07/2023   ICAO (internal carotid artery occlusion), left 02/06/2023   Chest pain 12/08/2022   Class 3 obesity (HCC) 10/21/2022   Acute on chronic diastolic CHF (congestive heart failure) (HCC) 10/20/2022   IDA (iron  deficiency anemia) 01/13/2022   Atrial fibrillation, chronic (HCC) 01/05/2022   Breast cancer (HCC) 01/05/2022   Primary hyperparathyroidism 01/05/2022   Essential hypertension 01/05/2022   Age-related nuclear cataract of both eyes 12/11/2018   Nuclear sclerotic cataract of both eyes 12/11/2018   Abnormal INR 01/29/2018   Long term (current) use of anticoagulants 01/29/2018   Encounter for therapeutic drug monitoring 01/29/2018   Vitamin D  deficiency 05/03/2017   S/P  laparoscopic cholecystectomy 02/26/2017   Calculus of gallbladder without cholecystitis without obstruction 02/23/2017   Diarrhea of presumed infectious origin 02/23/2017   Hyperglycemia 02/23/2017   Right upper quadrant abdominal pain 02/23/2017   Lower extremity edema 01/18/2016   Age-related osteoporosis without current pathological fracture 01/17/2016   Diverticulosis of large  intestine without hemorrhage 01/17/2016   GERD (gastroesophageal reflux disease) 01/17/2016   History of left breast cancer 01/17/2016   Multinodular goiter 01/17/2016   OAB (overactive bladder) 01/17/2016   History of cancer chemotherapy 03/06/2015   History of left mastectomy 03/06/2015   Malignant neoplasm of overlapping sites of left female breast (HCC) 03/06/2015    Past Medical History:  Diagnosis Date   Anemia    Bladder cancer [facility])    urologist--- dr pace   Chronic diastolic (congestive) heart failure (HCC)    followed by dr pietro   Chronic venous insufficiency    Edema of both lower extremities    per pt wears compression hose   GERD (gastroesophageal reflux disease)    History of cancer chemotherapy    completed 2008 for left breast cancer   History of head and neck radiation 1946   per pt approx 1946 or 1947  (age 38) due to 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 patel;   2006  s/p left thyroidectomy w/ right inferior parathyoidectomy  (per path speciman marked parathyroid but only thyroid tissue)   Hypertension    IDA (iron  deficiency anemia)    hematology/ oncologist--- dr ennever/ sarah carter NP;  treated w/ IV iron  infusions   Multinodular thyroid    Neuropathy    mild hands/ feet, uses cane   Nocturia more than twice per night    Nocturnal leg cramps    OA (osteoarthritis)    knees   OAB (overactive bladder)    Osteoporosis    PAF (paroxysmal atrial fibrillation) [facility])    cardiologist--- dr pietro   PONV (postoperative nausea and vomiting)     Vitamin D  deficiency    Wears glasses     Past Surgical History:  Procedure Laterality Date   BIOPSY OF SKIN SUBCUTANEOUS TISSUE AND/OR MUCOUS MEMBRANE  10/07/2024   Procedure: BIOPSY, SKIN, SUBCUTANEOUS TISSUE, OR MUCOUS MEMBRANE;  Surgeon: San Sandor GAILS, DO;  Location: [facility];  Service: Gastroenterology;;   BREAST LUMPECTOMY WITH RADIOACTIVE SEED LOCALIZATION Right 10/25/2021   Procedure: RIGHT BREAST LUMPECTOMY WITH RADIOACTIVE SEED LOCALIZATION;  Surgeon: Vanderbilt Ned, MD;  Location: [facility];  Service: General;  Laterality: Right;   CARDIOVERSION N/A 01/01/2023   Procedure: CARDIOVERSION;  Surgeon: Pietro Redell RAMAN, MD;  Location: [facility];  Service: Cardiovascular;  Laterality: N/A;   CHOLECYSTECTOMY, LAPAROSCOPIC  02/25/2017   @[facility]    COLONOSCOPY WITH ESOPHAGOGASTRODUODENOSCOPY (EGD)  2022   ESOPHAGOGASTRODUODENOSCOPY N/A 10/07/2024   Procedure: EGD (ESOPHAGOGASTRODUODENOSCOPY);  Surgeon: San Sandor GAILS, DO;  Location: [facility];  Service: Gastroenterology;  Laterality: N/A;   HEMOSTASIS CLIP PLACEMENT  10/07/2024   Procedure: CONTROL OF HEMORRHAGE, GI TRACT, ENDOSCOPIC, BY CLIPPING OR OVERSEWING;  Surgeon: San Sandor GAILS, DO;  Location:  [facility];  Service: Gastroenterology;;   HOT HEMOSTASIS N/A 10/07/2024   Procedure: EGD, WITH ARGON PLASMA COAGULATION;  Surgeon: San Sandor GAILS, DO;  Location: [facility];  Service: Gastroenterology;  Laterality: N/A;   IR ANGIO INTRA EXTRACRAN SEL COM CAROTID INNOMINATE UNI R MOD SED  02/07/2023   IR ANGIO VERTEBRAL SEL SUBCLAVIAN INNOMINATE UNI L MOD SED  02/07/2023   IR CT HEAD LTD  02/07/2023   IR INTRAVSC STENT CERV CAROTID W/O EMB-PROT MOD SED INC ANGIO  02/07/2023   IR PERCUTANEOUS ART THROMBECTOMY/INFUSION INTRACRANIAL INC DIAG ANGIO  02/07/2023   IR RADIOLOGIST EVAL & MGMT  03/10/2023   MASTECTOMY WITH AXILLARY LYMPH NODE DISSECTION Left 03/05/2007   @ [facility]   OVARIAN CYST SURGERY     age 40;    abdominal   POLYPECTOMY  10/07/2024   Procedure: POLYPECTOMY, INTESTINE;  Surgeon: San Sandor GAILS, DO;  Location: [facility];  Service: Gastroenterology;;   RADIOLOGY WITH ANESTHESIA N/A 02/06/2023   Procedure: IR WITH ANESTHESIA;  Surgeon: Radiologist, Medication, MD;  Location: [facility] OR;  Service: Radiology;  Laterality: N/A;   THYROIDECTOMY, PARTIAL Left 2006   left lobectomy and right infertior parathyroidectomy   TONSILLECTOMY     child    Social History[1]  Family History  Problem Relation Age of Onset   Hypertension Mother    Hypertension Father    Hypertension Daughter    Hypertension Son    Sudden death Neg Hx     Allergies[2]  Medication list has been reviewed and updated.  Medications Ordered Prior to Encounter[3]  Review of Systems:  ***  Physical Examination: There were no vitals filed for this visit. There were no vitals filed for this visit. There is no height or weight on file to calculate BMI. Ideal Body Weight:    ***  Assessment and Plan: No diagnosis found.  Assessment & Plan   Signed Harlene Schroeder, MD    [1]  Social History Tobacco Use   Smoking status: Former    Current packs/day: 0.00    Types: Cigarettes    Quit date: 1969    Years since quitting: 57.0   Smokeless tobacco: Never  Vaping Use   Vaping status: Never Used  Substance Use Topics   Alcohol use: Never   Drug use: Never  [2]  Allergies Allergen Reactions   Bee Venom Swelling    Bee stung her on the inside of her gum. Lips and gums started swelling. No SOB.   Levaquin [Levofloxacin] Hives and Itching   Peanut-Containing Drug Products Other (See Comments)    Stomach pain and diarrhea   [3]  Current Outpatient Medications on File Prior to Visit  Medication Sig Dispense Refill   acetaminophen  (TYLENOL ) 325 MG tablet Take 2 tablets (650 mg total) by mouth every 4 (four) hours as needed for mild pain (or temp > 37.5 C (99.5 F)).     apixaban  (ELIQUIS ) 5 MG TABS  tablet TAKE ONE TABLET BY MOUTH TWICE A DAY 180 tablet 3   [MAR Hold] aspirin  EC 81 MG tablet Take 1 tablet (81 mg total) by mouth daily. Swallow whole.     benzonatate  (TESSALON ) 100 MG capsule Take 1 capsule (100 mg total) by mouth 2 (two) times daily as needed for cough. (Patient not taking: Reported on 10/06/2024) 20 capsule 0   cinacalcet  (SENSIPAR ) 30 MG tablet Take 1 tablet (30 mg total) by mouth daily with breakfast. 30 tablet 0   empagliflozin  (JARDIANCE ) 10 MG TABS  tablet Take 1 tablet (10 mg total) by mouth daily. 90 tablet 3   ergocalciferol  (VITAMIN D2) 1.25 MG (50000 UT) capsule Take 50,000 Units by mouth every 14 (fourteen) days. Every other saturday     furosemide  (LASIX ) 20 MG tablet TAKE THREE TABLETS BY MOUTH ONE TIME DAILY , TAKE AN EXTRA TABLET AS NEEDED FOR SWELLING 270 tablet 0   magnesium  oxide (MAG-OX) 400 (240 Mg) MG tablet TAKE ONE TABLET BY MOUTH ONE TIME DAILY 90 tablet 0   metoprolol  tartrate (LOPRESSOR ) 50 MG tablet TAKE ONE AND ONE-HALF TABLETS BY MOUTH TWICE A DAY (Patient taking differently: Take 75 mg by mouth 2 (two) times daily. TAKE ONE AND ONE-HALF TABLETS BY MOUTH TWICE A DAY) 270 tablet 3   oxybutynin  (DITROPAN -XL) 10 MG 24 hr tablet Take 1 tablet (10 mg total) by mouth in the morning and at bedtime. 180 tablet 3   pantoprazole  (PROTONIX ) 40 MG tablet Take 1 tablet (40 mg total) by mouth 2 (two) times daily. 60 tablet 2   potassium chloride  SA (KLOR-CON  M) 20 MEQ tablet Take 1 tablet (20 mEq total) by mouth once for 1 dose. TAKE ONE TABLET BY MOUTH DAILY - USE WHEN TAKING MORE THAN 20 MG OF FUROSEMIDE  DAILY 90 tablet 3   No current facility-administered medications on file prior to visit.   "

## 2024-10-25 NOTE — Patient Instructions (Signed)
 It was good to see you again today!

## 2024-10-27 ENCOUNTER — Ambulatory Visit: Admitting: Family Medicine

## 2024-10-27 VITALS — BP 108/64 | HR 81 | Ht <= 58 in | Wt 161.2 lb

## 2024-10-27 DIAGNOSIS — I482 Chronic atrial fibrillation, unspecified: Secondary | ICD-10-CM | POA: Diagnosis not present

## 2024-10-27 DIAGNOSIS — I10 Essential (primary) hypertension: Secondary | ICD-10-CM

## 2024-10-27 DIAGNOSIS — D62 Acute posthemorrhagic anemia: Secondary | ICD-10-CM | POA: Diagnosis not present

## 2024-10-27 LAB — POC HEMOCCULT BLD/STL (OFFICE/1-CARD/DIAGNOSTIC): Fecal Occult Blood, POC: NEGATIVE

## 2024-10-28 ENCOUNTER — Encounter: Payer: Self-pay | Admitting: Family Medicine

## 2024-10-28 LAB — CBC
HCT: 31.3 % — ABNORMAL LOW (ref 36.0–46.0)
Hemoglobin: 9.9 g/dL — ABNORMAL LOW (ref 12.0–15.0)
MCHC: 31.5 g/dL (ref 30.0–36.0)
MCV: 75.6 fl — ABNORMAL LOW (ref 78.0–100.0)
Platelets: 317 K/uL (ref 150.0–400.0)
RBC: 4.15 Mil/uL (ref 3.87–5.11)
RDW: 27.9 % — ABNORMAL HIGH (ref 11.5–15.5)
WBC: 6.5 K/uL (ref 4.0–10.5)

## 2024-10-28 LAB — FERRITIN: Ferritin: 14.8 ng/mL (ref 10.0–291.0)

## 2024-10-28 LAB — BASIC METABOLIC PANEL WITH GFR
BUN: 21 mg/dL (ref 6–23)
CO2: 30 meq/L (ref 19–32)
Calcium: 9.9 mg/dL (ref 8.4–10.5)
Chloride: 102 meq/L (ref 96–112)
Creatinine, Ser: 1.16 mg/dL (ref 0.40–1.20)
GFR: 42.67 mL/min — ABNORMAL LOW
Glucose, Bld: 89 mg/dL (ref 70–99)
Potassium: 3.4 meq/L — ABNORMAL LOW (ref 3.5–5.1)
Sodium: 144 meq/L (ref 135–145)

## 2024-12-31 ENCOUNTER — Ambulatory Visit: Admitting: Adult Health

## 2025-01-28 ENCOUNTER — Ambulatory Visit: Admitting: Family Medicine

## 2025-08-21 ENCOUNTER — Ambulatory Visit
